# Patient Record
Sex: Female | Born: 1960 | Race: White | Hispanic: No | Marital: Single | Smoking: Former smoker
Health system: Southern US, Community
[De-identification: ages and names within clinical notes are randomized; demographics above are authoritative.]

## PROBLEM LIST (undated history)

## (undated) DIAGNOSIS — L719 Rosacea, unspecified: Secondary | ICD-10-CM

## (undated) DIAGNOSIS — J449 Chronic obstructive pulmonary disease, unspecified: Secondary | ICD-10-CM

## (undated) DIAGNOSIS — J302 Other seasonal allergic rhinitis: Secondary | ICD-10-CM

## (undated) DIAGNOSIS — F32A Depression, unspecified: Secondary | ICD-10-CM

## (undated) DIAGNOSIS — G4730 Sleep apnea, unspecified: Secondary | ICD-10-CM

## (undated) DIAGNOSIS — I10 Essential (primary) hypertension: Secondary | ICD-10-CM

## (undated) DIAGNOSIS — R112 Nausea with vomiting, unspecified: Secondary | ICD-10-CM

## (undated) DIAGNOSIS — E039 Hypothyroidism, unspecified: Secondary | ICD-10-CM

## (undated) DIAGNOSIS — J189 Pneumonia, unspecified organism: Secondary | ICD-10-CM

## (undated) DIAGNOSIS — Z9889 Other specified postprocedural states: Secondary | ICD-10-CM

## (undated) DIAGNOSIS — F329 Major depressive disorder, single episode, unspecified: Secondary | ICD-10-CM

## (undated) DIAGNOSIS — T7840XA Allergy, unspecified, initial encounter: Secondary | ICD-10-CM

## (undated) DIAGNOSIS — E079 Disorder of thyroid, unspecified: Secondary | ICD-10-CM

## (undated) HISTORY — DX: Sleep apnea, unspecified: G47.30

## (undated) HISTORY — DX: Major depressive disorder, single episode, unspecified: F32.9

## (undated) HISTORY — DX: Depression, unspecified: F32.A

## (undated) HISTORY — DX: Allergy, unspecified, initial encounter: T78.40XA

## (undated) HISTORY — DX: Essential (primary) hypertension: I10

## (undated) HISTORY — DX: Rosacea, unspecified: L71.9

## (undated) HISTORY — PX: BREAST CYST ASPIRATION: SHX578

## (undated) HISTORY — PX: FOOT SURGERY: SHX648

## (undated) HISTORY — DX: Disorder of thyroid, unspecified: E07.9

---

## 1983-11-12 HISTORY — PX: APPENDECTOMY: SHX54

## 1992-11-11 HISTORY — PX: CATARACT EXTRACTION: SUR2

## 2005-06-18 ENCOUNTER — Ambulatory Visit: Payer: Self-pay | Admitting: Family Medicine

## 2005-08-19 ENCOUNTER — Ambulatory Visit: Payer: Self-pay | Admitting: Otolaryngology

## 2005-11-11 HISTORY — PX: ABDOMINAL HYSTERECTOMY: SHX81

## 2006-03-13 ENCOUNTER — Ambulatory Visit: Payer: Self-pay | Admitting: Internal Medicine

## 2006-03-17 ENCOUNTER — Ambulatory Visit: Payer: Self-pay | Admitting: Internal Medicine

## 2006-06-03 ENCOUNTER — Ambulatory Visit: Payer: Self-pay | Admitting: Internal Medicine

## 2006-07-16 ENCOUNTER — Ambulatory Visit: Payer: Self-pay | Admitting: Internal Medicine

## 2006-07-24 ENCOUNTER — Ambulatory Visit: Payer: Self-pay | Admitting: Family Medicine

## 2006-08-05 ENCOUNTER — Ambulatory Visit: Payer: Self-pay | Admitting: Urology

## 2006-08-29 ENCOUNTER — Inpatient Hospital Stay: Payer: Self-pay | Admitting: Obstetrics and Gynecology

## 2006-09-20 ENCOUNTER — Other Ambulatory Visit: Payer: Self-pay

## 2006-09-20 ENCOUNTER — Emergency Department: Payer: Self-pay | Admitting: Emergency Medicine

## 2006-09-24 ENCOUNTER — Ambulatory Visit: Payer: Self-pay | Admitting: Urology

## 2006-09-29 ENCOUNTER — Ambulatory Visit: Payer: Self-pay | Admitting: Urology

## 2007-02-02 ENCOUNTER — Ambulatory Visit: Payer: Self-pay | Admitting: Urology

## 2007-07-05 ENCOUNTER — Ambulatory Visit: Payer: Self-pay

## 2007-08-26 ENCOUNTER — Ambulatory Visit: Payer: Self-pay | Admitting: Family Medicine

## 2008-02-05 ENCOUNTER — Ambulatory Visit: Payer: Self-pay | Admitting: Urology

## 2008-11-11 DIAGNOSIS — I7100 Dissection of unspecified site of aorta: Secondary | ICD-10-CM

## 2008-11-11 HISTORY — DX: Dissection of unspecified site of aorta: I71.00

## 2009-02-01 ENCOUNTER — Ambulatory Visit: Payer: Self-pay | Admitting: Urology

## 2009-05-09 ENCOUNTER — Ambulatory Visit: Payer: Self-pay | Admitting: Obstetrics and Gynecology

## 2009-06-11 HISTORY — PX: AORTIC VALVE REPLACEMENT: SHX41

## 2009-07-02 ENCOUNTER — Ambulatory Visit: Payer: Self-pay | Admitting: Cardiovascular Disease

## 2009-07-02 ENCOUNTER — Emergency Department (HOSPITAL_COMMUNITY): Admission: EM | Admit: 2009-07-02 | Discharge: 2009-07-02 | Payer: Self-pay | Admitting: Emergency Medicine

## 2009-07-19 ENCOUNTER — Ambulatory Visit: Payer: Self-pay | Admitting: Family Medicine

## 2009-09-13 ENCOUNTER — Ambulatory Visit: Payer: Self-pay | Admitting: Obstetrics and Gynecology

## 2009-11-27 ENCOUNTER — Ambulatory Visit: Payer: Self-pay | Admitting: Surgery

## 2010-01-17 ENCOUNTER — Ambulatory Visit: Payer: Self-pay | Admitting: Urology

## 2010-05-16 ENCOUNTER — Ambulatory Visit: Payer: Self-pay | Admitting: Obstetrics and Gynecology

## 2011-01-23 ENCOUNTER — Ambulatory Visit: Payer: Self-pay | Admitting: Urology

## 2011-02-08 ENCOUNTER — Ambulatory Visit: Payer: Self-pay | Admitting: Family Medicine

## 2011-02-16 LAB — COMPREHENSIVE METABOLIC PANEL
Albumin: 3.6 g/dL (ref 3.5–5.2)
Alkaline Phosphatase: 97 U/L (ref 39–117)
BUN: 8 mg/dL (ref 6–23)
Creatinine, Ser: 1.01 mg/dL (ref 0.4–1.2)
Glucose, Bld: 177 mg/dL — ABNORMAL HIGH (ref 70–99)
Total Protein: 6.4 g/dL (ref 6.0–8.3)

## 2011-02-16 LAB — POCT I-STAT, CHEM 8
Creatinine, Ser: 1 mg/dL (ref 0.4–1.2)
Glucose, Bld: 166 mg/dL — ABNORMAL HIGH (ref 70–99)
HCT: 39 % (ref 36.0–46.0)
Hemoglobin: 13.3 g/dL (ref 12.0–15.0)
Potassium: 3.7 mEq/L (ref 3.5–5.1)
Sodium: 140 mEq/L (ref 135–145)
TCO2: 22 mmol/L (ref 0–100)

## 2011-02-16 LAB — CK TOTAL AND CKMB (NOT AT ARMC)
CK, MB: 0.9 ng/mL (ref 0.3–4.0)
Relative Index: INVALID (ref 0.0–2.5)
Total CK: 40 U/L (ref 7–177)

## 2011-02-16 LAB — CBC
Hemoglobin: 13.4 g/dL (ref 12.0–15.0)
MCHC: 35.1 g/dL (ref 30.0–36.0)
Platelets: 249 10*3/uL (ref 150–400)
RDW: 12.4 % (ref 11.5–15.5)

## 2011-02-16 LAB — TROPONIN I: Troponin I: 0.01 ng/mL (ref 0.00–0.06)

## 2011-02-16 LAB — PROTIME-INR: INR: 1.2 (ref 0.00–1.49)

## 2011-02-16 LAB — DIFFERENTIAL
Basophils Absolute: 0.1 10*3/uL (ref 0.0–0.1)
Basophils Relative: 0 % (ref 0–1)
Lymphocytes Relative: 19 % (ref 12–46)
Monocytes Absolute: 0.7 10*3/uL (ref 0.1–1.0)
Monocytes Relative: 5 % (ref 3–12)
Neutro Abs: 9.7 10*3/uL — ABNORMAL HIGH (ref 1.7–7.7)
Neutrophils Relative %: 75 % (ref 43–77)

## 2011-02-16 LAB — HEMOCCULT GUIAC POC 1CARD (OFFICE): Fecal Occult Bld: NEGATIVE

## 2011-02-16 LAB — LIPASE, BLOOD: Lipase: 17 U/L (ref 11–59)

## 2011-03-26 NOTE — Consult Note (Signed)
Nancy [name], Nancy [name] NO.:  [id number]   MEDICAL RECORD NO.:  [id number]          PATIENT TYPE:  EMS   LOCATION:  MAJO                         FACILITY:  [facility]   PHYSICIAN:  Noralyn Pick. Eden Emms, MD, FACCDATE OF BIRTH:  [birth year]   DATE OF CONSULTATION:  DATE OF DISCHARGE:                                 CONSULTATION   PRIMARY CARDIOLOGIST:  Dr. Evette Georges in [place].   PRIMARY CARE Khira Cudmore:  Dr. Elease Hashimoto.   PATIENT PROFILE:  A 50 year old Caucasian female with history of aortic  insufficiency followed by Dr. Evette Georges who presented to the [facility]  ED this morning with chest pain and diaphoresis.   PROBLEM LIST:  1. Chest pain and diaphoresis.  1A.  History of negative stress test 2009.  1. Hypertension.  2. Hypothyroidism.  3. Depression.  4. Nocturnal hypoxia, requiring 2 L of O2 at night.  5. Ongoing tobacco abuse 25 pack-year history, now smoking 1-2      cigarettes a day.  6. Status post hysterectomy.  7. Status post appendectomy.  8. History of aortic insufficiency followed by Dr. Evette Georges with      yearly echocardiograms.  9. Status post appendectomy.  10.Status post left foot surgery.  11.History of eye surgery.   HISTORY OF PRESENT ILLNESS:  A 50 year old Caucasian female with history  of aortic insufficiency followed by Dr. Evette Georges in [place].  She  reports having had a negative Myoview in 2009, and says she has yearly  echos.  She is not sure as to the severity of her AI.  Approximately 3  weeks ago, she had an episode of throat, chest, and epigastric pain with  nausea and vomiting for 2 days nonstop and then this resolved  spontaneously.  This morning, she awoke at 8:30 with similar symptoms,  that were 10/10.  She was markedly diaphoretic as well as nauseated with  vomiting x1 at home.  After about 30 minutes, she called the EMS and was  taken to the [facility] ED.  She had multiple episodes of vomiting.  She  continues to have throat,  chest, and epigastric pain, which is not  particularly reproducible.  She has also continued to have nausea with  vomiting and diaphoresis.  She has been treated with fentanyl 300 mcg IV  as well as 4 mg of IV Zofran with improvement in symptoms to 2/10.  She  does have slight worsening of symptoms with deep breathing.  Enzymes are  negative.  ECG is nonacute.  WBCs are up at 13.1, she is afebrile and  her chest x-ray has shown asymmetric infiltrate or edema.  She has a  normal mediastinum.   ALLERGIES:  No known drug allergies.   HOME MEDICATIONS:  1. Coreg 12.5 mg b.i.d.  2. Hydrochlorothiazide 12.5 mg daily.  3. Levothyroxine 125 mcg daily.  4. Zoloft 150 mg daily.  5. Wellbutrin 450 mg daily.   FAMILY HISTORY:  Mother is age 50, alive and well.  Father died at 71  with CAD with history of CABG.  She has a sister who is alive  and well.   SOCIAL HISTORY:  She lives in [place] with her boyfriend.  She works as  an Airline pilot.  She has a 25-pack-year history of tobacco abuse,  currently smoking 1-2 cigarettes a day.  She drinks 3-4 beers a day.  She denies drug use and is not on routine exercise.   REVIEW OF SYSTEMS:  Positive for chest pain, shortness of breath,  diaphoresis, nausea, and vomiting.  Otherwise all systems reviewed are  negative.  She is a full code.   PHYSICAL EXAMINATION:  VITAL SIGNS:  Temperature 98.8, heart rate 82,  respirations 18, blood pressure 116/72, pulse ox 98% on 2 L.  GENERAL:  Pleasant white female in no acute distress.  Awake, alert,  oriented x3.  She is lethargic having just received narcotics.  NEURO:  Grossly intact, nonfocal.  SKIN:  Warm and dry without lesions or masses.  MUSCULOSKELETAL:  Grossly normal without deformity or fusion.  NECK:  Supple without bruits or JVD.  LUNGS:  His lungs with diminished breath sounds, but otherwise clear.  CARDIAC:  Regular S1 and S2.  There is a soft diastolic murmur at the  right upper sternal border.   ABDOMEN:  Round, soft, nontender, nondistended.  Bowel sounds present  x4.  EXTREMITIES:  Warm, dry, pink.  No clubbing, cyanosis, or edema.  Dorsalis pedis, posterior tibial pulse are 2+ equal bilaterally.   LABORATORY DATA:  Chest x-ray shows asymmetric infiltrate or edema, left  greater than right mediastinum not particularly widened.  EKG shows  sinus rhythm, rate 83, normal axis, no acute changes.  Hemoglobin 13.4,  hematocrit 38.0, WBC 13.1, platelets 249.  Sodium 140, potassium 3.7,  chloride 108, CO2 of 24.  BUN 8, creatinine 1.01, glucose 177, AST 19,  ALT 14.  CK-MB 0.9, troponin I 0.01.   ASSESSMENT:  1. Chest pain.  She has throat, chest, and epigastric, heavy and      sharp, pain that has been improved with fentanyl and Zofran.  She      had similar symptoms x2 days about 3 weeks ago and those symptoms      resolved spontaneously.  Enzymes so far negative.  ECG is nonacute.      Not clearly cardiac. Given her known history of aortic valve      pathology will check a CT to R/O disection since aortopathy is      frequently associated with relatively young people with AR.  Will      also assess the degree of AR by echo.  It is not verry prominant on      exam.  If cardiac and aortic disease R/O then would consider gi w/u      for esophagitis/gastritis with possible EGD   Addendum:  CT scan shows Type A disection.  ER physician discussed with  CVTS and physician in OR.  They arranged for transfer to [facility] for urgent  surgery.      Nicolasa Ducking, ANP      Noralyn Pick. Eden Emms, MD, [facility]  Electronically Signed    CB/MEDQ  D:  07/02/2009  T:  07/03/2009  Job:  [id number]

## 2011-07-03 DIAGNOSIS — I7100 Dissection of unspecified site of aorta: Secondary | ICD-10-CM | POA: Insufficient documentation

## 2011-07-30 ENCOUNTER — Ambulatory Visit: Payer: Self-pay | Admitting: Family Medicine

## 2012-05-27 ENCOUNTER — Ambulatory Visit: Payer: Self-pay | Admitting: Family Medicine

## 2012-06-12 LAB — HM PAP SMEAR

## 2012-06-22 LAB — HM HEPATITIS C SCREENING LAB: HM Hepatitis Screen: NEGATIVE

## 2012-06-22 LAB — HM HIV SCREENING LAB: HM HIV Screening: NEGATIVE

## 2013-01-28 DIAGNOSIS — Q6239 Other obstructive defects of renal pelvis and ureter: Secondary | ICD-10-CM | POA: Insufficient documentation

## 2013-02-24 ENCOUNTER — Ambulatory Visit: Payer: Self-pay | Admitting: Specialist

## 2013-02-24 LAB — BASIC METABOLIC PANEL
BUN: 10 mg/dL (ref 7–18)
Calcium, Total: 8.7 mg/dL (ref 8.5–10.1)
Chloride: 102 mmol/L (ref 98–107)
Co2: 32 mmol/L (ref 21–32)
Creatinine: 1.04 mg/dL (ref 0.60–1.30)
EGFR (African American): >60
Glucose: 79 mg/dL (ref 65–99)
Osmolality: 272 (ref 275–301)

## 2013-03-02 ENCOUNTER — Ambulatory Visit: Payer: Self-pay | Admitting: Specialist

## 2013-03-02 LAB — PROTIME-INR: INR: 1.2

## 2013-04-12 HISTORY — PX: CARPAL TUNNEL RELEASE: SHX101

## 2013-08-31 ENCOUNTER — Ambulatory Visit: Payer: Self-pay | Admitting: Gastroenterology

## 2013-08-31 LAB — HM COLONOSCOPY

## 2013-09-22 ENCOUNTER — Ambulatory Visit: Payer: Self-pay | Admitting: Family Medicine

## 2013-11-22 DIAGNOSIS — Z9889 Other specified postprocedural states: Secondary | ICD-10-CM | POA: Insufficient documentation

## 2014-03-10 ENCOUNTER — Ambulatory Visit: Payer: Self-pay | Admitting: Family Medicine

## 2014-03-11 ENCOUNTER — Ambulatory Visit: Payer: Self-pay | Admitting: Family Medicine

## 2014-03-11 ENCOUNTER — Emergency Department: Payer: Self-pay | Admitting: Emergency Medicine

## 2014-03-11 LAB — COMPREHENSIVE METABOLIC PANEL
ALBUMIN: 3.8 g/dL (ref 3.4–5.0)
ANION GAP: 8 (ref 7–16)
Alkaline Phosphatase: 115 U/L
BILIRUBIN TOTAL: 0.5 mg/dL (ref 0.2–1.0)
BUN: 7 mg/dL (ref 7–18)
CALCIUM: 9.1 mg/dL (ref 8.5–10.1)
CHLORIDE: 98 mmol/L (ref 98–107)
CREATININE: 0.7 mg/dL (ref 0.60–1.30)
Co2: 29 mmol/L (ref 21–32)
EGFR (African American): >60
Glucose: 77 mg/dL (ref 65–99)
Osmolality: 267 (ref 275–301)
Potassium: 3.3 mmol/L — ABNORMAL LOW (ref 3.5–5.1)
SGOT(AST): 22 U/L (ref 15–37)
SGPT (ALT): 25 U/L (ref 12–78)
SODIUM: 135 mmol/L — AB (ref 136–145)
Total Protein: 7.9 g/dL (ref 6.4–8.2)

## 2014-03-11 LAB — APTT: ACTIVATED PTT: 41.7 s — AB (ref 23.6–35.9)

## 2014-03-11 LAB — CBC
HCT: 41.3 % (ref 35.0–47.0)
HGB: 13.6 g/dL (ref 12.0–16.0)
MCH: 31.1 pg (ref 26.0–34.0)
MCHC: 33 g/dL (ref 32.0–36.0)
MCV: 94 fL (ref 80–100)
PLATELETS: 235 10*3/uL (ref 150–440)
RBC: 4.39 10*6/uL (ref 3.80–5.20)
RDW: 13.5 % (ref 11.5–14.5)
WBC: 9.4 10*3/uL (ref 3.6–11.0)

## 2014-03-11 LAB — PROTIME-INR
INR: 2.5
Prothrombin Time: 26.6 secs — ABNORMAL HIGH (ref 11.5–14.7)

## 2014-03-11 LAB — TROPONIN I

## 2014-03-23 ENCOUNTER — Ambulatory Visit: Payer: Self-pay | Admitting: Family Medicine

## 2014-03-24 ENCOUNTER — Ambulatory Visit: Payer: Self-pay | Admitting: Family Medicine

## 2014-04-18 ENCOUNTER — Ambulatory Visit: Payer: Self-pay | Admitting: Podiatry

## 2015-03-03 NOTE — Op Note (Signed)
PATIENT NAME:  Nancy [name], Nancy [name] MR#:  [id number] DATE OF BIRTH:  01/30/1961  DATE OF PROCEDURE:  03/02/2013  PREOPERATIVE DIAGNOSIS: Left cubital tunnel syndrome.   POSTOPERATIVE DIAGNOSIS: Left cubital tunnel syndrome.   PROCEDURE: Anterior transposition of left ulnar nerve at the elbow.   SURGEON: Valinda HoarHoward E. Rosan Calbert, M.D.   ANESTHESIA: General LMA.   COMPLICATIONS: None.   DRAINS: None.   ESTIMATED BLOOD LOSS: None.   DESCRIPTION OF PROCEDURE: The patient was brought to the operating room where she underwent satisfactory general LMA anesthesia in the supine position. Her Coumadin was stopped 5 days before and her INR was 1.2. She was getting bridging Lovenox as ordered by her cardiologist for her artificial heart valve. The arm was prepped and draped in sterile fashion and [name] posteromedial incision made over the elbow. Dissection was carried out bluntly through subcutaneous tissue down to the fascia. The fascia was carefully divided above the elbow and the ulnar nerve identified. This was followed proximally and the fascia over the nerve was released. [name] finger was used to make sure that the release was carried out from proximally. The nerve was then followed distally and the fascia over the cubital tunnel released with Metzenbaum scissors and [name] knife with [name] hemostat protecting the nerve. This was followed out distally into the flexor muscle belly which was released as the fascia was released. This was released distally with [name] finger digitally as well. The nerve was then carefully freed up from adhesions. [name] large vessel drain was used to hold it up. The nerve was then felt to be freed proximally and distally and  could be easily transposed anteriorly. The subcutaneous tissue was elevated off the medial epicondyle. The nerve was transposed anteriorly and the subcutaneous tissue then sutured down to the fascia of the medial condyle was 0 Ethibond sutures. The wound was then irrigated. The subcutaneous  tissue was closed with 2-0 Vicryl and the skin was closed with staples. 0.5% Marcaine was placed in the wound. Dry sterile compression dressing was applied with [name] sugar tong splint. The tourniquet was deflated with good return of blood flow to the hand and pressure was applied to the dressing for 5 minutes. The patient was awakened and taken to recovery in good condition.  ____________________________ Valinda HoarHoward E. Neyla Gauntt, MD hem:sb D: 03/02/2013 08:57:51 ET T: 03/02/2013 09:14:17 ET JOB#: [id number]  cc: Valinda HoarHoward E. Bora Bost, MD, <Dictator> Valinda HoarHOWARD E Jusiah Aguayo MD ELECTRONICALLY SIGNED 03/02/2013 20:25

## 2015-05-09 DIAGNOSIS — F32A Depression, unspecified: Secondary | ICD-10-CM | POA: Insufficient documentation

## 2015-05-09 DIAGNOSIS — E559 Vitamin D deficiency, unspecified: Secondary | ICD-10-CM | POA: Insufficient documentation

## 2015-05-09 DIAGNOSIS — R7989 Other specified abnormal findings of blood chemistry: Secondary | ICD-10-CM | POA: Insufficient documentation

## 2015-05-09 DIAGNOSIS — I359 Nonrheumatic aortic valve disorder, unspecified: Secondary | ICD-10-CM | POA: Insufficient documentation

## 2015-05-09 DIAGNOSIS — I10 Essential (primary) hypertension: Secondary | ICD-10-CM | POA: Insufficient documentation

## 2015-05-09 DIAGNOSIS — F419 Anxiety disorder, unspecified: Secondary | ICD-10-CM | POA: Insufficient documentation

## 2015-05-09 DIAGNOSIS — E039 Hypothyroidism, unspecified: Secondary | ICD-10-CM | POA: Insufficient documentation

## 2015-05-09 DIAGNOSIS — R945 Abnormal results of liver function studies: Secondary | ICD-10-CM | POA: Insufficient documentation

## 2015-05-09 DIAGNOSIS — Q6210 Congenital occlusion of ureter, unspecified: Secondary | ICD-10-CM | POA: Insufficient documentation

## 2015-05-09 DIAGNOSIS — N63 Unspecified lump in unspecified breast: Secondary | ICD-10-CM | POA: Insufficient documentation

## 2015-05-09 DIAGNOSIS — M26609 Unspecified temporomandibular joint disorder, unspecified side: Secondary | ICD-10-CM | POA: Insufficient documentation

## 2015-05-09 DIAGNOSIS — R0602 Shortness of breath: Secondary | ICD-10-CM | POA: Insufficient documentation

## 2015-05-09 DIAGNOSIS — F329 Major depressive disorder, single episode, unspecified: Secondary | ICD-10-CM | POA: Insufficient documentation

## 2015-05-09 DIAGNOSIS — I351 Nonrheumatic aortic (valve) insufficiency: Secondary | ICD-10-CM | POA: Insufficient documentation

## 2015-05-09 DIAGNOSIS — N1330 Unspecified hydronephrosis: Secondary | ICD-10-CM | POA: Insufficient documentation

## 2015-05-09 DIAGNOSIS — J449 Chronic obstructive pulmonary disease, unspecified: Secondary | ICD-10-CM | POA: Insufficient documentation

## 2015-05-09 DIAGNOSIS — F411 Generalized anxiety disorder: Secondary | ICD-10-CM | POA: Insufficient documentation

## 2015-05-10 ENCOUNTER — Ambulatory Visit (INDEPENDENT_AMBULATORY_CARE_PROVIDER_SITE_OTHER): Payer: PRIVATE HEALTH INSURANCE | Admitting: Family Medicine

## 2015-05-10 ENCOUNTER — Encounter: Payer: Self-pay | Admitting: Family Medicine

## 2015-05-10 VITALS — BP 114/72 | HR 83 | Temp 98.7°F | Resp 16 | Ht 67.0 in | Wt 197.8 lb

## 2015-05-10 DIAGNOSIS — J40 Bronchitis, not specified as acute or chronic: Secondary | ICD-10-CM | POA: Diagnosis not present

## 2015-05-10 MED ORDER — HYDROCODONE-HOMATROPINE 5-1.5 MG/5ML PO SYRP: 5.0000 mL | ORAL_SOLUTION | Freq: Three times a day (TID) | ORAL | Status: DC | PRN

## 2015-05-10 MED ORDER — DOXYCYCLINE HYCLATE 100 MG PO TABS: 100.0000 mg | ORAL_TABLET | Freq: Two times a day (BID) | ORAL | Status: DC

## 2015-05-10 MED ORDER — PREDNISONE 20 MG PO TABS: ORAL_TABLET | ORAL | Status: DC

## 2015-05-10 NOTE — Progress Notes (Signed)
Subjective:     Patient ID: Erenest RasherLori A Mctigue, female   DOB: 02/20/1961, 54 y.o.   MRN: [id number]  HPI  Chief Complaint  Patient presents with  . Cough    patient comes in office today with complaints of productive cough and congestion for the past 10 days. Patient states that she has taken Night Quil, Alka-Seltzer plus for relief of symptoms  States she has noticed increased shortness of breath and purulent sputum: "I just don't feel good". Reports compliance with her inhalers followed by Dr. Meredeth IdeFleming.   Review of Systems  Constitutional: Negative for fever and chills.       Objective:   Physical Exam Ears: T.M's intact without inflammation Throat: no tonsillar enlargement or exudate Neck: no cervical adenopathy Lungs: clear    Assessment:    1. Bronchitis - doxycycline (VIBRA-TABS) 100 MG tablet; Take 1 tablet (100 mg total) by mouth 2 (two) times daily.  Dispense: 20 tablet; Refill: 0 - HYDROcodone-homatropine (HYCODAN) 5-1.5 MG/5ML syrup; Take 5 mLs by mouth every 8 (eight) hours as needed for cough.  Dispense: 120 mL; Refill: 0 - predniSONE (DELTASONE) 20 MG tablet; Taper as directed: 3 pills for four days, 2 pills for four days, one pill for four days  Dispense: 24 tablet; Refill: 0    Plan:    To start prednisone if shortness of breath not improving with treatment.

## 2015-05-10 NOTE — Patient Instructions (Addendum)
Add expectorant like Mucinex or Robitussin, Recheck protime with your cardiologist while on antibiotics.

## 2015-08-07 ENCOUNTER — Ambulatory Visit (INDEPENDENT_AMBULATORY_CARE_PROVIDER_SITE_OTHER): Payer: No Typology Code available for payment source | Admitting: Family Medicine

## 2015-08-07 ENCOUNTER — Encounter: Payer: Self-pay | Admitting: Family Medicine

## 2015-08-07 VITALS — BP 116/64 | HR 80 | Temp 98.5°F | Resp 16 | Wt 204.0 lb

## 2015-08-07 DIAGNOSIS — J449 Chronic obstructive pulmonary disease, unspecified: Secondary | ICD-10-CM

## 2015-08-07 DIAGNOSIS — Z952 Presence of prosthetic heart valve: Secondary | ICD-10-CM | POA: Insufficient documentation

## 2015-08-07 DIAGNOSIS — Z954 Presence of other heart-valve replacement: Secondary | ICD-10-CM

## 2015-08-07 NOTE — Progress Notes (Signed)
Patient ID: Nancy [name], female   DOB: [birth year], 53 y.o.   MRN: [id number]         Patient: Nancy [name] Female    DOB: November 07, 1961   54 y.o.   MRN: [id number] Visit Date: 08/07/2015  Today's Provider: Lorie Phenix, MD   Chief Complaint  Patient presents with  . COPD   Subjective:    Breathing Problem There is no cough, difficulty breathing, shortness of breath, sputum production or wheezing. This is a chronic problem. The problem has been unchanged. Pertinent negatives include no chest pain, dyspnea on exertion, ear congestion, ear pain, fever, headaches, malaise/fatigue, nasal congestion, PND, postnasal drip, rhinorrhea, sneezing or sore throat. Her symptoms are alleviated by steroid inhaler. She reports significant improvement on treatment. Her past medical history is significant for COPD.   Hypothyroid is stable.   Mood also stable. Follow up with Dr.  Maryruth Bun regularly.      Allergies  Allergen Reactions  . Codeine Nausea Only    Can take cough syrup.   Previous Medications   ACETAMINOPHEN (TYLENOL) 500 MG TABLET    Take 2 tablets by mouth every 6 (six) hours as needed.   ALBUTEROL-IPRATROPIUM (COMBIVENT) 18-103 MCG/ACT INHALER    Inhale 2 puffs into the lungs 4 (four) times daily.   AMLODIPINE (NORVASC) 5 MG TABLET    Take 1 tablet by mouth daily.   BUDESONIDE-FORMOTEROL (SYMBICORT) 160-4.5 MCG/ACT INHALER    Inhale into the lungs.   BUPROPION (WELLBUTRIN XL) 300 MG 24 HR TABLET    Take 1 tablet by mouth daily.   CARVEDILOL (COREG) 25 MG TABLET    Take 1 tablet by mouth 2 (two) times daily.   FUROSEMIDE (LASIX) 40 MG TABLET    Take 1 tablet by mouth daily.   HYDROCODONE-HOMATROPINE (HYCODAN) 5-1.5 MG/5ML SYRUP    Take 5 mLs by mouth every 8 (eight) hours as needed for cough.   LEVOTHYROXINE (SYNTHROID, LEVOTHROID) 125 MCG TABLET    Take 1 tablet by mouth daily.   METHOCARBAMOL (ROBAXIN) 500 MG TABLET    Take 500 mg by mouth 2 (two) times daily.   MONTELUKAST (SINGULAIR)  10 MG TABLET    Take 1 tablet by mouth daily.   PREDNISONE (DELTASONE) 20 MG TABLET    Taper as directed: 3 pills for four days, 2 pills for four days, one pill for four days   VILAZODONE HCL (VIIBRYD) 40 MG TABS    Take 1 tablet by mouth daily.   WARFARIN (COUMADIN) 2.5 MG TABLET    Take 2 tablets by mouth as directed.    Review of Systems  Constitutional: Negative.  Negative for fever and malaise/fatigue.  HENT: Negative.  Negative for ear pain, postnasal drip, rhinorrhea, sneezing and sore throat.   Respiratory: Negative.  Negative for cough, sputum production, shortness of breath and wheezing.   Cardiovascular: Negative.  Negative for chest pain, dyspnea on exertion and PND.  Neurological: Negative for headaches.    Social History  Substance Use Topics  . Smoking status: Light Tobacco Smoker    Types: E-cigarettes  . Smokeless tobacco: Not on file     Comment: patient states  that she is working at her own pace to quit smoking  . Alcohol Use: 0.0 oz/week    0 Standard drinks or equivalent per week     Comment: OCCASIONALLY   Objective:   BP 116/64 mmHg  Pulse 80  Temp(Src) 98.5 F (36.9 C) (Oral)  Resp 16  Wt 204 lb (92.534 kg)  Physical Exam  Constitutional: She is oriented to person, place, and time. She appears well-developed and well-nourished.  Cardiovascular: Normal rate and regular rhythm.   Pulmonary/Chest: Effort normal and breath sounds normal.  Neurological: She is alert and oriented to person, place, and time.  Psychiatric: She has a normal mood and affect. Her behavior is normal. Judgment and thought content normal.        Assessment & Plan:     1. Chronic obstructive pulmonary disease, unspecified COPD, unspecified chronic bronchitis type Stable.  Continue current medication.  Filled out form for DMV.  - Spirometry with graph    Thyroid and mood stable.   Lorie Phenix, MD        Lorie Phenix, MD  [facility]

## 2015-08-11 ENCOUNTER — Other Ambulatory Visit: Payer: Self-pay | Admitting: Family Medicine

## 2015-08-11 DIAGNOSIS — Z1231 Encounter for screening mammogram for malignant neoplasm of breast: Secondary | ICD-10-CM

## 2015-08-14 ENCOUNTER — Ambulatory Visit
Admission: RE | Admit: 2015-08-14 | Discharge: 2015-08-14 | Disposition: A | Discharge status: Home / Self Care | Payer: No Typology Code available for payment source | Source: Ambulatory Visit | Attending: Family Medicine | Admitting: Family Medicine

## 2015-08-14 DIAGNOSIS — Z1231 Encounter for screening mammogram for malignant neoplasm of breast: Secondary | ICD-10-CM | POA: Insufficient documentation

## 2015-08-17 ENCOUNTER — Ambulatory Visit: Payer: No Typology Code available for payment source | Attending: Specialist

## 2015-08-17 DIAGNOSIS — G4733 Obstructive sleep apnea (adult) (pediatric): Secondary | ICD-10-CM | POA: Diagnosis not present

## 2015-08-24 ENCOUNTER — Other Ambulatory Visit: Payer: Self-pay | Admitting: Family Medicine

## 2015-08-24 ENCOUNTER — Ambulatory Visit (INDEPENDENT_AMBULATORY_CARE_PROVIDER_SITE_OTHER): Payer: No Typology Code available for payment source | Admitting: Family Medicine

## 2015-08-24 ENCOUNTER — Encounter: Payer: Self-pay | Admitting: Family Medicine

## 2015-08-24 VITALS — BP 108/62 | HR 89 | Temp 98.2°F | Resp 20 | Ht 67.0 in | Wt 208.0 lb

## 2015-08-24 DIAGNOSIS — R202 Paresthesia of skin: Secondary | ICD-10-CM | POA: Diagnosis not present

## 2015-08-24 DIAGNOSIS — E038 Other specified hypothyroidism: Secondary | ICD-10-CM

## 2015-08-24 DIAGNOSIS — Z0000 Encounter for general adult medical examination without abnormal findings: Secondary | ICD-10-CM | POA: Diagnosis not present

## 2015-08-24 DIAGNOSIS — I10 Essential (primary) hypertension: Secondary | ICD-10-CM | POA: Diagnosis not present

## 2015-08-24 DIAGNOSIS — I359 Nonrheumatic aortic valve disorder, unspecified: Secondary | ICD-10-CM

## 2015-08-24 DIAGNOSIS — E039 Hypothyroidism, unspecified: Secondary | ICD-10-CM

## 2015-08-24 NOTE — Progress Notes (Signed)
Patient ID: Nancy RasherLori A Seabrooks, female   DOB: 05/22/1961, 54 y.o.   MRN: [id number]        Patient: Nancy [name], Female    DOB: 03/04/1961, 54 y.o.   MRN: [id number] Visit Date: 08/24/2015  Today's Provider: Lorie PhenixNancy Chiron Campione, MD   Chief Complaint  Patient presents with  . Annual Exam   Subjective:    Annual physical exam Nancy [name] is a 54 y.o. female who presents today for health maintenance and complete physical. She feels well. She reports exercising . She reports she is sleeping fairly well.  Has complicated PMH and has been following up with specialist. Stable.    07/02/13 CPE 06/12/12 Pap-neg 08/14/15 Mamm-BI-RADS 1 08/31/13 Colon-Polyp-ng recheck in 10 years 03/11/14 EKG  Lab Results  Component Value Date   WBC 9.4 03/11/2014   HGB 13.6 03/11/2014   HCT 41.3 03/11/2014   PLT 235 03/11/2014   GLUCOSE 77 03/11/2014   ALT 25 03/11/2014   AST 22 03/11/2014   NA 135* 03/11/2014   K 3.3* 03/11/2014   CL 98 03/11/2014   CREATININE 0.70 03/11/2014   BUN 7 03/11/2014   CO2 29 03/11/2014   INR 2.5 03/11/2014    -----------------------------------------------------------------   Review of Systems  Constitutional: Positive for fatigue.  HENT: Negative.   Eyes: Negative.   Respiratory: Positive for shortness of breath.   Cardiovascular: Positive for leg swelling.  Gastrointestinal: Negative.   Endocrine: Negative.   Genitourinary: Negative.   Musculoskeletal: Positive for arthralgias ( bursitis).  Skin: Negative.   Allergic/Immunologic: Negative.   Neurological: Negative.   Hematological: Bruises/bleeds easily.  Psychiatric/Behavioral: Negative.     Social History She  reports that she has been smoking E-cigarettes.  She has never used smokeless tobacco. She reports that she drinks alcohol. She reports that she does not use illicit drugs. Social History   Social History  . Marital Status: Single    Spouse Name: N/A  . Number of Children: N/A  . Years of  Education: N/A   Social History Main Topics  . Smoking status: Light Tobacco Smoker    Types: E-cigarettes  . Smokeless tobacco: Never Used     Comment: patient states  that she is working at her own pace to quit smoking  . Alcohol Use: 0.0 oz/week    0 Standard drinks or equivalent per week     Comment: OCCASIONALLY  . Drug Use: No  . Sexual Activity: Not Asked   Other Topics Concern  . None   Social History Narrative    Patient Active Problem List   Diagnosis Date Noted  . 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 Anxiety 05/09/2015  . Breast lump 05/09/2015  . CAFL (chronic airflow limitation) (HCC) 05/09/2015  . Clinical depression 05/09/2015  . Abnormal LFTs 05/09/2015  . Hydronephrosis 05/09/2015  . Adult hypothyroidism 05/09/2015  . BP (high blood pressure) 05/09/2015  . AI (aortic incompetence) 05/09/2015  . D-dimer, elevated 05/09/2015  . Breath shortness 05/09/2015  . 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 Avitaminosis D 05/09/2015  . Depression, major, single episode 05/09/2015  . Essential (primary) hypertension 05/09/2015  . Anxiety state 05/09/2015  . Aortic valve defect 05/09/2015  . Chronic obstructive pulmonary disease (HCC) 05/09/2015  . History of open heart surgery 11/22/2013  . 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 Aortic dissection (HCC) 07/03/2011  . Dissection of aorta (HCC) 07/03/2011    Past Surgical History  Procedure Laterality Date  . Appendectomy  1985  . Abdominal hysterectomy  2007  . Foot surgery Left 1997-1998  . Cataract extraction Right 1994  . Aortic valve replacement  06/2009  . Carpal tunnel release Left 04/12/2013    Family History  Family Status  Relation Status Death Age  . Mother Alive   . Father Deceased   . Maternal Grandmother Deceased   . Paternal Grandmother Deceased   . Maternal Grandfather Deceased   . Paternal Grandfather Deceased    Her family  history includes Breast cancer (age of onset: 4) in her mother; Colon cancer in her maternal grandmother; Heart disease in her father and paternal grandmother; Hypertension in her father; Pneumonia in her mother.    Allergies  Allergen Reactions  . Codeine Nausea Only    Can take cough syrup.    Previous Medications   ACETAMINOPHEN (TYLENOL) 500 MG TABLET    Take 2 tablets by mouth every 6 (six) hours as needed.   ALBUTEROL-IPRATROPIUM (COMBIVENT) 18-103 MCG/ACT INHALER    Inhale 2 puffs into the lungs every 6 (six) hours as needed.    AMLODIPINE (NORVASC) 5 MG TABLET    Take 1 tablet by mouth daily.   BUDESONIDE-FORMOTEROL (SYMBICORT) 160-4.5 MCG/ACT INHALER    Inhale 2 puffs into the lungs daily.    BUPROPION (WELLBUTRIN XL) 300 MG 24 HR TABLET    Take 1 tablet by mouth daily.   CARVEDILOL (COREG) 25 MG TABLET    Take 1 tablet by mouth 2 (two) times daily.   FUROSEMIDE (LASIX) 40 MG TABLET    Take 1 tablet by mouth daily.   LEVOTHYROXINE (SYNTHROID, LEVOTHROID) 125 MCG TABLET    TAKE 1 TABLET BY MOUTH DAILY   METHOCARBAMOL (ROBAXIN) 500 MG TABLET    Take 500 mg by mouth 2 (two) times daily.   MONTELUKAST (SINGULAIR) 10 MG TABLET    Take 1 tablet by mouth daily.   VILAZODONE HCL (VIIBRYD) 40 MG TABS    Take 1 tablet by mouth daily.   WARFARIN (COUMADIN) 2.5 MG TABLET    Take 2 tablets by mouth as directed.    Patient Care Team: Lorie Phenix, MD as PCP - General (Family Medicine)     Objective:   Vitals: BP 108/62 mmHg  Pulse 89  Temp(Src) 98.2 F (36.8 C) (Oral)  Resp 20  Ht  (1.702 m)  Wt 208 lb (94.348 kg)  BMI 32.57 kg/m2  SpO2 96%   Physical Exam  Constitutional: She is oriented to person, place, and time. She appears well-developed and well-nourished.  HENT:  Head: Normocephalic and atraumatic.  Right Ear: Tympanic membrane, external ear and ear canal normal.  Left Ear: Tympanic membrane, external ear and ear canal normal.  Nose: Nose normal.  Mouth/Throat:  Uvula is midline, oropharynx is clear and moist and mucous membranes are normal.  Eyes: Conjunctivae, EOM and lids are normal. Pupils are equal, round, and reactive to light.  Neck: Trachea normal and normal range of motion. Neck supple. Carotid bruit is not present. No thyroid mass and no thyromegaly present.  Cardiovascular: Normal rate and regular rhythm.   Valve click audible.   Pulmonary/Chest: Effort normal and breath sounds normal.  Abdominal: Soft. Normal appearance and bowel sounds are normal. There is no hepatosplenomegaly. There is no tenderness.  Genitourinary: No breast swelling, tenderness or discharge.  Musculoskeletal: Normal range of motion.  Lymphadenopathy:    She has no cervical adenopathy.    She has  no axillary adenopathy.  Neurological: She is alert and oriented to person, place, and time. She has normal strength. No cranial nerve deficit.  Skin: Skin is warm, dry and intact.  Psychiatric: She has a normal mood and affect. Her speech is normal and behavior is normal. Judgment and thought content normal. Cognition and memory are normal.     Depression Screen PHQ 2/9 Scores 08/24/2015  Exception Documentation Other- indicate reason in comment box  Not completed pt seen by Dr. Maryruth Bun      Assessment & Plan:     Routine Health Maintenance and Physical Exam  Exercise Activities and Dietary recommendations Goals    . Exercise 150 minutes per week (moderate activity)       Immunization History  Administered Date(s) Administered  . Td 11/01/2005  . Tdap 06/12/2012    Health Maintenance  Topic Date Due  . Hepatitis C Screening  04/26/1961  . HIV Screening  03/16/1978  . PAP SMEAR  03/16/1984  . COLONOSCOPY  03/16/2013  . INFLUENZA VACCINE  06/12/2015  . MAMMOGRAM  08/13/2017  . TETANUS/TDAP  06/12/2022      1. Annual physical exam Stable. Patient advised to continue eating healthy and exercise daily.  2. Essential hypertension - Comprehensive  metabolic panel - Lipid Panel With LDL/HDL Ratio  3. Other specified hypothyroidism - TSH  4. Aortic valve defect - INR/PT  5. Paresthesia - Vitamin B12     Patient seen and examined by Dr. Leo Grosser, and note scribed by Liz Beach. Dimas, CMA.  I have reviewed the document for accuracy and completeness and I agree with above. Leo Grosser, MD   Lorie Phenix, MD       --------------------------------------------------------------------

## 2015-08-30 ENCOUNTER — Telehealth: Payer: Self-pay

## 2015-08-30 LAB — COMPREHENSIVE METABOLIC PANEL
A/G RATIO: 1.7 (ref 1.1–2.5)
ALT: 13 IU/L (ref 0–32)
AST: 19 IU/L (ref 0–40)
Albumin: 4.3 g/dL (ref 3.5–5.5)
Alkaline Phosphatase: 125 IU/L — ABNORMAL HIGH (ref 39–117)
BUN/Creatinine Ratio: 9 (ref 9–23)
BUN: 9 mg/dL (ref 6–24)
Bilirubin Total: 0.8 mg/dL (ref 0.0–1.2)
CALCIUM: 9.5 mg/dL (ref 8.7–10.2)
CO2: 27 mmol/L (ref 18–29)
Chloride: 99 mmol/L (ref 97–106)
Creatinine, Ser: 1 mg/dL (ref 0.57–1.00)
GFR, EST AFRICAN AMERICAN: 75 mL/min/{1.73_m2} (ref >59)
GFR, EST NON AFRICAN AMERICAN: 65 mL/min/{1.73_m2} (ref >59)
GLOBULIN, TOTAL: 2.6 g/dL (ref 1.5–4.5)
Glucose: 93 mg/dL (ref 65–99)
POTASSIUM: 4.2 mmol/L (ref 3.5–5.2)
SODIUM: 140 mmol/L (ref 136–144)
TOTAL PROTEIN: 6.9 g/dL (ref 6.0–8.5)

## 2015-08-30 LAB — LIPID PANEL WITH LDL/HDL RATIO
Cholesterol, Total: 176 mg/dL (ref 100–199)
HDL: 53 mg/dL (ref >39)
LDL Calculated: 103 mg/dL — ABNORMAL HIGH (ref 0–99)
LDL/HDL RATIO: 1.9 ratio (ref 0.0–3.2)
Triglycerides: 102 mg/dL (ref 0–149)
VLDL Cholesterol Cal: 20 mg/dL (ref 5–40)

## 2015-08-30 LAB — TSH: TSH: 2.29 u[IU]/mL (ref 0.450–4.500)

## 2015-08-30 LAB — VITAMIN B12: VITAMIN B 12: 988 pg/mL — AB (ref 211–946)

## 2015-08-30 LAB — PROTIME-INR
INR: 2.2 — AB (ref 0.8–1.2)
PROTHROMBIN TIME: 22.3 s — AB (ref 9.1–12.0)

## 2015-08-30 NOTE — Telephone Encounter (Signed)
-----   Message from Lorie PhenixNancy Maloney, MD sent at 08/30/2015  6:59 AM EDT ----- Labs stable. INR stable. Please notify patient. Thanks.

## 2015-08-30 NOTE — Telephone Encounter (Signed)
Pt advised.   Thanks,   -Gloria Lambertson  

## 2015-09-13 ENCOUNTER — Ambulatory Visit: Payer: No Typology Code available for payment source | Attending: Specialist

## 2015-09-13 DIAGNOSIS — G4761 Periodic limb movement disorder: Secondary | ICD-10-CM | POA: Insufficient documentation

## 2015-09-13 DIAGNOSIS — G4733 Obstructive sleep apnea (adult) (pediatric): Secondary | ICD-10-CM | POA: Diagnosis not present

## 2015-09-17 ENCOUNTER — Other Ambulatory Visit: Payer: Self-pay | Admitting: Family Medicine

## 2015-09-17 DIAGNOSIS — J309 Allergic rhinitis, unspecified: Secondary | ICD-10-CM

## 2015-09-18 DIAGNOSIS — J309 Allergic rhinitis, unspecified: Secondary | ICD-10-CM | POA: Insufficient documentation

## 2015-11-12 HISTORY — PX: OTHER SURGICAL HISTORY: SHX169

## 2015-12-15 ENCOUNTER — Encounter: Payer: Self-pay | Admitting: Family Medicine

## 2015-12-25 ENCOUNTER — Encounter: Payer: Self-pay | Admitting: Family Medicine

## 2015-12-25 ENCOUNTER — Ambulatory Visit (INDEPENDENT_AMBULATORY_CARE_PROVIDER_SITE_OTHER): Payer: BLUE CROSS/BLUE SHIELD | Admitting: Family Medicine

## 2015-12-25 VITALS — BP 124/70 | HR 84 | Temp 98.8°F | Resp 16 | Wt 207.0 lb

## 2015-12-25 DIAGNOSIS — N63 Unspecified lump in breast: Secondary | ICD-10-CM

## 2015-12-25 DIAGNOSIS — N6310 Unspecified lump in the right breast, unspecified quadrant: Secondary | ICD-10-CM

## 2015-12-25 NOTE — Progress Notes (Signed)
Patient: Nancy [name] Female    DOB: [birth year]   55 y.o.   MRN: [id number] Visit Date: 12/25/2015  Today's Provider: Lorie Phenix, MD   Chief Complaint  Patient presents with  . Breast Mass    Right breast Found on CT 10/16/2015   Subjective:    HPI  Breast Mass: Patient presents for evaluation of a breast mass. Change was noted 2 months ago. Patient does routinely do self breast exams.  Patient does not have noted a change on breast exam. Breast cancer risk factors include family hx on mother's side and mom with breast CA.   Age of menarche was 20.  Last menstrual period was 2007. Patient does not hormonal therapy. Patient is G0P0. Age of first live birth was 88. Patient did not breast feed. Patient denies nipple discharge. Patient admits to to previous breast biopsy. Patient denies a personal history of breast cancer.      Allergies  Allergen Reactions  . Codeine Nausea Only    Can take cough syrup.   Previous Medications   ACETAMINOPHEN (TYLENOL) 500 MG TABLET    Take 2 tablets by mouth every 6 (six) hours as needed.   ALBUTEROL-IPRATROPIUM (COMBIVENT) 18-103 MCG/ACT INHALER    Inhale 2 puffs into the lungs every 6 (six) hours as needed.    AMLODIPINE (NORVASC) 5 MG TABLET    Take 1 tablet by mouth daily.   BUDESONIDE-FORMOTEROL (SYMBICORT) 160-4.5 MCG/ACT INHALER    Inhale 2 puffs into the lungs daily.    BUPROPION (WELLBUTRIN XL) 300 MG 24 HR TABLET    Take 1 tablet by mouth daily.   CARVEDILOL (COREG) 25 MG TABLET    Take 1 tablet by mouth 2 (two) times daily.   FUROSEMIDE (LASIX) 40 MG TABLET    Take 1 tablet by mouth daily.   LEVOTHYROXINE (SYNTHROID, LEVOTHROID) 125 MCG TABLET    TAKE 1 TABLET BY MOUTH DAILY   METHOCARBAMOL (ROBAXIN) 500 MG TABLET    Take 500 mg by mouth 2 (two) times daily.   MONTELUKAST (SINGULAIR) 10 MG TABLET    TAKE 1 TABLET BY MOUTH EVERY DAY   VILAZODONE HCL (VIIBRYD) 40 MG TABS    Take 1 tablet by mouth daily.   WARFARIN (COUMADIN)  2.5 MG TABLET    Take 2 tablets by mouth as directed.    Review of Systems  Constitutional: Negative.     Social History  Substance Use Topics  . Smoking status: Light Tobacco Smoker    Types: E-cigarettes  . Smokeless tobacco: Never Used     Comment: patient states  that she is working at her own pace to quit smoking  . Alcohol Use: 0.0 oz/week    0 Standard drinks or equivalent per week     Comment: OCCASIONALLY   Objective:   BP 124/70 mmHg  Pulse 84  Temp(Src) 98.8 F (37.1 C) (Oral)  Resp 16  Wt 207 lb (93.895 kg)  Physical Exam  Constitutional: She is oriented to person, place, and time. She appears well-developed and well-nourished.  Pulmonary/Chest: Right breast exhibits no mass and no tenderness. Left breast exhibits no mass and no tenderness. Breasts are symmetrical.  Neurological: She is alert and oriented to person, place, and time.      Assessment & Plan:     1. Breast mass, right New problem. Seen on scan. Exam normal. Will check mammogram and ultrasound.  - MM Digital Diagnostic Unilat R -  US BREAST LTD UNI RIGHT INC AXILLA     Patient was seen and examined by Leo Grosser, MD, and note scribed by Kavin Leech, CMA.  I have reviewed the document for accuracy and completeness and I agree with above. - Leo Grosser, MD   Lorie Phenix, MD  [facility]

## 2016-01-02 ENCOUNTER — Other Ambulatory Visit (HOSPITAL_COMMUNITY): Payer: Self-pay | Admitting: Diagnostic Radiology

## 2016-01-02 ENCOUNTER — Ambulatory Visit
Admission: RE | Admit: 2016-01-02 | Discharge: 2016-01-02 | Disposition: A | Discharge status: Home / Self Care | Payer: No Typology Code available for payment source | Source: Ambulatory Visit | Attending: Diagnostic Radiology | Admitting: Diagnostic Radiology

## 2016-01-02 DIAGNOSIS — R9389 Abnormal findings on diagnostic imaging of other specified body structures: Secondary | ICD-10-CM

## 2016-01-02 NOTE — Progress Notes (Unsigned)
Patient ID: Nancy [name], female   DOB: July 01, 1961, 55 y.o.   MRN: [id number]   Patient had a CTA chest at [facility] 10/16/15 stating 3.1cm right breast mass needing further workup.  Note that this mass has been documented to represent a cyst by previous US 03/23/14 and is actually smaller compared to prior mammograms dating back to 2005. I discussed the above with Dr. Lorie Phenix today 01/02/16.

## 2016-01-15 ENCOUNTER — Telehealth: Payer: Self-pay | Admitting: Family Medicine

## 2016-01-15 NOTE — Telephone Encounter (Signed)
[facility] have received film from CT.Pt no longer needs diagnostic mammogram,can go back to screening.She is due in Oct

## 2016-01-24 ENCOUNTER — Other Ambulatory Visit: Payer: Self-pay | Admitting: Family Medicine

## 2016-01-24 DIAGNOSIS — E038 Other specified hypothyroidism: Secondary | ICD-10-CM

## 2016-01-31 DIAGNOSIS — G4733 Obstructive sleep apnea (adult) (pediatric): Secondary | ICD-10-CM | POA: Diagnosis not present

## 2016-01-31 DIAGNOSIS — J439 Emphysema, unspecified: Secondary | ICD-10-CM | POA: Diagnosis not present

## 2016-02-08 DIAGNOSIS — G4733 Obstructive sleep apnea (adult) (pediatric): Secondary | ICD-10-CM | POA: Diagnosis not present

## 2016-02-12 ENCOUNTER — Other Ambulatory Visit: Payer: Self-pay | Admitting: Family Medicine

## 2016-02-12 ENCOUNTER — Encounter: Payer: Self-pay | Admitting: Family Medicine

## 2016-02-12 DIAGNOSIS — I7100 Dissection of unspecified site of aorta: Secondary | ICD-10-CM | POA: Diagnosis not present

## 2016-02-12 DIAGNOSIS — Z952 Presence of prosthetic heart valve: Secondary | ICD-10-CM | POA: Diagnosis not present

## 2016-02-12 DIAGNOSIS — I10 Essential (primary) hypertension: Secondary | ICD-10-CM | POA: Diagnosis not present

## 2016-02-12 DIAGNOSIS — I359 Nonrheumatic aortic valve disorder, unspecified: Secondary | ICD-10-CM | POA: Diagnosis not present

## 2016-02-12 DIAGNOSIS — Z5181 Encounter for therapeutic drug level monitoring: Secondary | ICD-10-CM | POA: Diagnosis not present

## 2016-02-12 MED ORDER — VALACYCLOVIR HCL 1 G PO TABS
2000.0000 mg | ORAL_TABLET | Freq: Two times a day (BID) | ORAL | Status: DC
Start: 2016-02-12 — End: 2018-03-16

## 2016-02-14 DIAGNOSIS — R0602 Shortness of breath: Secondary | ICD-10-CM | POA: Diagnosis not present

## 2016-02-14 DIAGNOSIS — G4733 Obstructive sleep apnea (adult) (pediatric): Secondary | ICD-10-CM | POA: Diagnosis not present

## 2016-02-14 DIAGNOSIS — J410 Simple chronic bronchitis: Secondary | ICD-10-CM | POA: Diagnosis not present

## 2016-02-19 DIAGNOSIS — Z952 Presence of prosthetic heart valve: Secondary | ICD-10-CM | POA: Diagnosis not present

## 2016-02-19 DIAGNOSIS — I719 Aortic aneurysm of unspecified site, without rupture: Secondary | ICD-10-CM | POA: Diagnosis not present

## 2016-03-02 DIAGNOSIS — G4733 Obstructive sleep apnea (adult) (pediatric): Secondary | ICD-10-CM | POA: Diagnosis not present

## 2016-03-02 DIAGNOSIS — J439 Emphysema, unspecified: Secondary | ICD-10-CM | POA: Diagnosis not present

## 2016-03-05 DIAGNOSIS — R0602 Shortness of breath: Secondary | ICD-10-CM | POA: Diagnosis not present

## 2016-03-10 DIAGNOSIS — G4733 Obstructive sleep apnea (adult) (pediatric): Secondary | ICD-10-CM | POA: Diagnosis not present

## 2016-03-17 DIAGNOSIS — I7101 Dissection of thoracic aorta: Secondary | ICD-10-CM | POA: Diagnosis not present

## 2016-03-17 DIAGNOSIS — J9692 Respiratory failure, unspecified with hypercapnia: Secondary | ICD-10-CM | POA: Diagnosis not present

## 2016-03-17 DIAGNOSIS — I711 Thoracic aortic aneurysm, ruptured: Secondary | ICD-10-CM | POA: Diagnosis not present

## 2016-03-17 DIAGNOSIS — J449 Chronic obstructive pulmonary disease, unspecified: Secondary | ICD-10-CM | POA: Diagnosis not present

## 2016-03-17 DIAGNOSIS — J939 Pneumothorax, unspecified: Secondary | ICD-10-CM | POA: Diagnosis not present

## 2016-03-17 DIAGNOSIS — T82898A Other specified complication of vascular prosthetic devices, implants and grafts, initial encounter: Secondary | ICD-10-CM | POA: Diagnosis not present

## 2016-03-17 DIAGNOSIS — J9811 Atelectasis: Secondary | ICD-10-CM | POA: Diagnosis not present

## 2016-03-17 DIAGNOSIS — I719 Aortic aneurysm of unspecified site, without rupture: Secondary | ICD-10-CM | POA: Diagnosis not present

## 2016-03-17 DIAGNOSIS — E039 Hypothyroidism, unspecified: Secondary | ICD-10-CM | POA: Diagnosis not present

## 2016-03-17 DIAGNOSIS — Z952 Presence of prosthetic heart valve: Secondary | ICD-10-CM | POA: Diagnosis not present

## 2016-03-17 DIAGNOSIS — I712 Thoracic aortic aneurysm, without rupture: Secondary | ICD-10-CM | POA: Diagnosis not present

## 2016-03-17 DIAGNOSIS — Z4682 Encounter for fitting and adjustment of non-vascular catheter: Secondary | ICD-10-CM | POA: Diagnosis not present

## 2016-03-17 DIAGNOSIS — I517 Cardiomegaly: Secondary | ICD-10-CM | POA: Diagnosis not present

## 2016-03-17 DIAGNOSIS — E876 Hypokalemia: Secondary | ICD-10-CM | POA: Diagnosis not present

## 2016-03-17 DIAGNOSIS — J9622 Acute and chronic respiratory failure with hypercapnia: Secondary | ICD-10-CM | POA: Diagnosis not present

## 2016-03-17 DIAGNOSIS — E872 Acidosis: Secondary | ICD-10-CM | POA: Diagnosis not present

## 2016-03-17 DIAGNOSIS — Z8679 Personal history of other diseases of the circulatory system: Secondary | ICD-10-CM | POA: Diagnosis not present

## 2016-03-17 DIAGNOSIS — I10 Essential (primary) hypertension: Secondary | ICD-10-CM | POA: Diagnosis not present

## 2016-03-17 DIAGNOSIS — J811 Chronic pulmonary edema: Secondary | ICD-10-CM | POA: Diagnosis not present

## 2016-03-17 DIAGNOSIS — E559 Vitamin D deficiency, unspecified: Secondary | ICD-10-CM | POA: Diagnosis not present

## 2016-03-17 DIAGNOSIS — R918 Other nonspecific abnormal finding of lung field: Secondary | ICD-10-CM | POA: Diagnosis not present

## 2016-03-17 DIAGNOSIS — J948 Other specified pleural conditions: Secondary | ICD-10-CM | POA: Diagnosis not present

## 2016-03-17 DIAGNOSIS — J90 Pleural effusion, not elsewhere classified: Secondary | ICD-10-CM | POA: Diagnosis not present

## 2016-03-17 DIAGNOSIS — G8918 Other acute postprocedural pain: Secondary | ICD-10-CM | POA: Diagnosis not present

## 2016-03-17 DIAGNOSIS — R079 Chest pain, unspecified: Secondary | ICD-10-CM | POA: Diagnosis not present

## 2016-03-18 DIAGNOSIS — I7101 Dissection of thoracic aorta: Secondary | ICD-10-CM | POA: Diagnosis not present

## 2016-03-18 DIAGNOSIS — R918 Other nonspecific abnormal finding of lung field: Secondary | ICD-10-CM | POA: Diagnosis not present

## 2016-03-18 DIAGNOSIS — R079 Chest pain, unspecified: Secondary | ICD-10-CM | POA: Diagnosis not present

## 2016-03-18 DIAGNOSIS — Z952 Presence of prosthetic heart valve: Secondary | ICD-10-CM | POA: Diagnosis not present

## 2016-03-19 DIAGNOSIS — I712 Thoracic aortic aneurysm, without rupture: Secondary | ICD-10-CM | POA: Diagnosis not present

## 2016-03-19 DIAGNOSIS — Z4682 Encounter for fitting and adjustment of non-vascular catheter: Secondary | ICD-10-CM | POA: Diagnosis not present

## 2016-03-19 DIAGNOSIS — I719 Aortic aneurysm of unspecified site, without rupture: Secondary | ICD-10-CM | POA: Diagnosis not present

## 2016-03-19 DIAGNOSIS — I711 Thoracic aortic aneurysm, ruptured: Secondary | ICD-10-CM | POA: Diagnosis not present

## 2016-03-19 DIAGNOSIS — J811 Chronic pulmonary edema: Secondary | ICD-10-CM | POA: Diagnosis not present

## 2016-03-20 DIAGNOSIS — J811 Chronic pulmonary edema: Secondary | ICD-10-CM | POA: Diagnosis not present

## 2016-03-20 DIAGNOSIS — Z952 Presence of prosthetic heart valve: Secondary | ICD-10-CM | POA: Diagnosis not present

## 2016-03-20 DIAGNOSIS — G8918 Other acute postprocedural pain: Secondary | ICD-10-CM | POA: Diagnosis not present

## 2016-03-20 DIAGNOSIS — J9622 Acute and chronic respiratory failure with hypercapnia: Secondary | ICD-10-CM | POA: Diagnosis not present

## 2016-03-20 DIAGNOSIS — I517 Cardiomegaly: Secondary | ICD-10-CM | POA: Diagnosis not present

## 2016-03-20 DIAGNOSIS — R918 Other nonspecific abnormal finding of lung field: Secondary | ICD-10-CM | POA: Diagnosis not present

## 2016-03-20 DIAGNOSIS — I712 Thoracic aortic aneurysm, without rupture: Secondary | ICD-10-CM | POA: Diagnosis not present

## 2016-03-20 DIAGNOSIS — I719 Aortic aneurysm of unspecified site, without rupture: Secondary | ICD-10-CM | POA: Diagnosis not present

## 2016-03-20 DIAGNOSIS — E872 Acidosis: Secondary | ICD-10-CM | POA: Diagnosis not present

## 2016-03-20 DIAGNOSIS — Z8679 Personal history of other diseases of the circulatory system: Secondary | ICD-10-CM | POA: Diagnosis not present

## 2016-03-21 DIAGNOSIS — J90 Pleural effusion, not elsewhere classified: Secondary | ICD-10-CM | POA: Diagnosis not present

## 2016-03-21 DIAGNOSIS — Z952 Presence of prosthetic heart valve: Secondary | ICD-10-CM | POA: Diagnosis not present

## 2016-03-21 DIAGNOSIS — J9811 Atelectasis: Secondary | ICD-10-CM | POA: Diagnosis not present

## 2016-03-21 DIAGNOSIS — R918 Other nonspecific abnormal finding of lung field: Secondary | ICD-10-CM | POA: Diagnosis not present

## 2016-03-22 DIAGNOSIS — Z952 Presence of prosthetic heart valve: Secondary | ICD-10-CM | POA: Diagnosis not present

## 2016-03-22 DIAGNOSIS — Z4682 Encounter for fitting and adjustment of non-vascular catheter: Secondary | ICD-10-CM | POA: Diagnosis not present

## 2016-03-23 DIAGNOSIS — J9811 Atelectasis: Secondary | ICD-10-CM | POA: Diagnosis not present

## 2016-03-23 DIAGNOSIS — Z952 Presence of prosthetic heart valve: Secondary | ICD-10-CM | POA: Diagnosis not present

## 2016-03-23 DIAGNOSIS — J939 Pneumothorax, unspecified: Secondary | ICD-10-CM | POA: Diagnosis not present

## 2016-03-23 DIAGNOSIS — J811 Chronic pulmonary edema: Secondary | ICD-10-CM | POA: Diagnosis not present

## 2016-03-24 DIAGNOSIS — J939 Pneumothorax, unspecified: Secondary | ICD-10-CM | POA: Diagnosis not present

## 2016-03-24 DIAGNOSIS — J811 Chronic pulmonary edema: Secondary | ICD-10-CM | POA: Diagnosis not present

## 2016-03-24 DIAGNOSIS — Z952 Presence of prosthetic heart valve: Secondary | ICD-10-CM | POA: Diagnosis not present

## 2016-03-24 DIAGNOSIS — J9811 Atelectasis: Secondary | ICD-10-CM | POA: Diagnosis not present

## 2016-03-24 DIAGNOSIS — J90 Pleural effusion, not elsewhere classified: Secondary | ICD-10-CM | POA: Diagnosis not present

## 2016-03-24 DIAGNOSIS — R918 Other nonspecific abnormal finding of lung field: Secondary | ICD-10-CM | POA: Diagnosis not present

## 2016-03-24 DIAGNOSIS — I712 Thoracic aortic aneurysm, without rupture: Secondary | ICD-10-CM | POA: Diagnosis not present

## 2016-03-24 DIAGNOSIS — J948 Other specified pleural conditions: Secondary | ICD-10-CM | POA: Diagnosis not present

## 2016-03-25 DIAGNOSIS — Z5181 Encounter for therapeutic drug level monitoring: Secondary | ICD-10-CM | POA: Diagnosis not present

## 2016-03-26 ENCOUNTER — Other Ambulatory Visit: Payer: Self-pay | Admitting: Family Medicine

## 2016-03-26 DIAGNOSIS — J309 Allergic rhinitis, unspecified: Secondary | ICD-10-CM

## 2016-04-01 DIAGNOSIS — J439 Emphysema, unspecified: Secondary | ICD-10-CM | POA: Diagnosis not present

## 2016-04-01 DIAGNOSIS — G4733 Obstructive sleep apnea (adult) (pediatric): Secondary | ICD-10-CM | POA: Diagnosis not present

## 2016-04-02 DIAGNOSIS — I482 Chronic atrial fibrillation: Secondary | ICD-10-CM | POA: Diagnosis not present

## 2016-04-09 DIAGNOSIS — G4733 Obstructive sleep apnea (adult) (pediatric): Secondary | ICD-10-CM | POA: Diagnosis not present

## 2016-04-09 DIAGNOSIS — J939 Pneumothorax, unspecified: Secondary | ICD-10-CM | POA: Diagnosis not present

## 2016-04-09 DIAGNOSIS — I719 Aortic aneurysm of unspecified site, without rupture: Secondary | ICD-10-CM | POA: Diagnosis not present

## 2016-04-22 DIAGNOSIS — N390 Urinary tract infection, site not specified: Secondary | ICD-10-CM | POA: Diagnosis not present

## 2016-04-22 DIAGNOSIS — R319 Hematuria, unspecified: Secondary | ICD-10-CM | POA: Diagnosis not present

## 2016-04-22 DIAGNOSIS — R300 Dysuria: Secondary | ICD-10-CM | POA: Diagnosis not present

## 2016-05-01 ENCOUNTER — Encounter: Payer: Self-pay | Admitting: Family Medicine

## 2016-05-01 ENCOUNTER — Ambulatory Visit (INDEPENDENT_AMBULATORY_CARE_PROVIDER_SITE_OTHER): Payer: BLUE CROSS/BLUE SHIELD | Admitting: Family Medicine

## 2016-05-01 VITALS — BP 110/64 | HR 96 | Temp 98.1°F | Resp 16 | Wt 194.0 lb

## 2016-05-01 DIAGNOSIS — R319 Hematuria, unspecified: Secondary | ICD-10-CM

## 2016-05-01 DIAGNOSIS — N3090 Cystitis, unspecified without hematuria: Secondary | ICD-10-CM

## 2016-05-01 LAB — POCT URINALYSIS DIPSTICK
BILIRUBIN UA: NEGATIVE
Glucose, UA: NEGATIVE
KETONES UA: NEGATIVE
NITRITE UA: NEGATIVE
PH UA: 6
Spec Grav, UA: 1.015
Urobilinogen, UA: 0.2

## 2016-05-01 MED ORDER — CEFDINIR 300 MG PO CAPS: 300.0000 mg | ORAL_CAPSULE | Freq: Two times a day (BID) | ORAL | Status: DC

## 2016-05-01 NOTE — Progress Notes (Signed)
Patient: Nancy RasherLori A Vanderwall Female    DOB: 07/23/1961   55 y.o.   MRN: [id number] Visit Date: 05/01/2016  Today's Provider: Lorie PhenixNancy Shaughn Thomley, MD   Chief Complaint  Patient presents with  . Urinary Tract Infection   Subjective:    Urinary Tract Infection  This is a recurrent ([place] and was put on Macrobid x 7 days for UTI.  Pt reports the UTI sx improved, but sx recurred last night,) problem. Quality: "stinging" The pain is mild. There has been no fever. There is no history of pyelonephritis. Associated symptoms include chills, flank pain (right), frequency and urgency. Pertinent negatives include no discharge, hematuria, hesitancy, nausea, sweats or vomiting. Treatments tried: abx. The treatment provided moderate relief.  Pt has a H/O hydronephrosis, stenosis of ureter, and congenital obstruction of uteropelvic junction.  Course also complicated by recent open heart surgery. Doing well. Also on chronic warfarin treatment.       Allergies  Allergen Reactions  . Codeine Nausea Only    Can take cough syrup.   Current Meds  Medication Sig  . acetaminophen (TYLENOL) 500 MG tablet Take 2 tablets by mouth every 6 (six) hours as needed.  Marland Kitchen. albuterol-ipratropium (COMBIVENT) 18-103 MCG/ACT inhaler Inhale 2 puffs into the lungs every 6 (six) hours as needed.   Marland Kitchen. amLODipine (NORVASC) 5 MG tablet Take 1 tablet by mouth daily.  Marland Kitchen. atorvastatin (LIPITOR) 10 MG tablet TAKE 1 TABLET (10 MG TOTAL) BY MOUTH ONCE DAILY.  . budesonide-formoterol (SYMBICORT) 160-4.5 MCG/ACT inhaler Inhale 2 puffs into the lungs daily.   Marland Kitchen. buPROPion (WELLBUTRIN XL) 300 MG 24 hr tablet Take 1 tablet by mouth daily.  . carvedilol (COREG) 25 MG tablet Take 1 tablet by mouth 2 (two) times daily.  . furosemide (LASIX) 40 MG tablet Take 1 tablet by mouth daily.  Marland Kitchen. levothyroxine (SYNTHROID, LEVOTHROID) 125 MCG tablet TAKE 1 TABLET BY MOUTH DAILY  . methocarbamol (ROBAXIN) 500 MG tablet  Take 500 mg by mouth 2 (two) times daily.  . montelukast (SINGULAIR) 10 MG tablet TAKE 1 TABLET BY MOUTH EVERY DAY  . valACYclovir (VALTREX) 1000 MG tablet Take 2 tablets (2,000 mg total) by mouth 2 (two) times daily. One day as needed  . Vilazodone HCl (VIIBRYD) 40 MG TABS Take 1 tablet by mouth daily.  Marland Kitchen. warfarin (COUMADIN) 2.5 MG tablet Take 2 tablets by mouth as directed.    Review of Systems  Constitutional: Positive for chills.  Gastrointestinal: Negative for nausea and vomiting.  Genitourinary: Positive for urgency, frequency and flank pain (right). Negative for hesitancy and hematuria.    Social History  Substance Use Topics  . Smoking status: Former Smoker    Quit date: 03/11/2016  . Smokeless tobacco: Never Used     Comment: patient states  that she is working at her own pace to quit smoking  . Alcohol Use: 0.0 oz/week    0 Standard drinks or equivalent per week     Comment: OCCASIONALLY   Objective:   BP 110/64 mmHg  Pulse 96  Temp(Src) 98.1 F (36.7 C) (Oral)  Resp 16  Wt 194 lb (87.998 kg)  Physical Exam  Constitutional: She is oriented to person, place, and time. She appears well-developed and well-nourished.  Abdominal: Soft. She exhibits no distension. There is CVA tenderness (right).  Neurological: She is alert and oriented to person, place, and time.  Psychiatric: She has a normal mood and affect.  Her behavior is normal.      Assessment & Plan:     1. Cystitis New problem. Concerning for early pyelonephritis.  UA positive. Not sensitive to previous dose of Macrobid. Treat with Omnicef as below. Send for cx. May need to change  abx pending the results. Call office if problems worsen. Is also on Warfarin.   Results for orders placed or performed in visit on 05/01/16  POCT urinalysis dipstick  Result Value Ref Range   Color, UA Yellow    Clarity, UA Cloudy    Glucose, UA Negative    Bilirubin, UA Negative    Ketones, UA Negative    Spec Grav, UA 1.015     Blood, UA Large    pH, UA 6.0    Protein, UA ++    Urobilinogen, UA 0.2    Nitrite, UA Negative    Leukocytes, UA large (3+) (A) Negative    - POCT urinalysis dipstick - Urine culture - cefdinir (OMNICEF) 300 MG capsule; Take 1 capsule (300 mg total) by mouth 2 (two) times daily.  Dispense: 14 capsule; Refill: 0  2. Hematuria New problem.  Will send for UA and culture. Is on blood thinner.   - Urinalysis, Routine w reflex microscopic (not at [facility])      Patient seen and examined by Leo Grosser, MD, and note scribed by Allene Dillon, CMA.  I have reviewed the document for accuracy and completeness and I agree with above. - Leo Grosser, MD   Lorie Phenix, MD  [facility]

## 2016-05-02 DIAGNOSIS — G4733 Obstructive sleep apnea (adult) (pediatric): Secondary | ICD-10-CM | POA: Diagnosis not present

## 2016-05-02 DIAGNOSIS — J439 Emphysema, unspecified: Secondary | ICD-10-CM | POA: Diagnosis not present

## 2016-05-02 LAB — URINALYSIS, ROUTINE W REFLEX MICROSCOPIC
BILIRUBIN UA: NEGATIVE
Glucose, UA: NEGATIVE
Ketones, UA: NEGATIVE
Nitrite, UA: NEGATIVE
PH UA: 6 (ref 5.0–7.5)
Specific Gravity, UA: 1.012 (ref 1.005–1.030)
Urobilinogen, Ur: 0.2 mg/dL (ref 0.2–1.0)

## 2016-05-02 LAB — MICROSCOPIC EXAMINATION
Casts: NONE SEEN /lpf
RBC, UA: >30 /hpf — AB (ref 0–?)
WBC, UA: >30 /hpf — AB (ref 0–?)

## 2016-05-03 ENCOUNTER — Telehealth: Payer: Self-pay

## 2016-05-03 LAB — URINE CULTURE

## 2016-05-03 NOTE — Telephone Encounter (Signed)
Pt advised.  She reports feeling some better.  Made follow up for Tuesday afternoon.   Thanks,   -Vernona RiegerLaura

## 2016-05-03 NOTE — Telephone Encounter (Signed)
-----   Message from Lorie PhenixNancy Maloney, MD sent at 05/03/2016  6:07 AM EDT ----- Culture did not grow anything out.  Please see how patient is doing.  Needs follow up to recheck urine and make sure red cells and white cells have resolved. Thanks.

## 2016-05-07 ENCOUNTER — Encounter: Payer: Self-pay | Admitting: Family Medicine

## 2016-05-07 ENCOUNTER — Ambulatory Visit (INDEPENDENT_AMBULATORY_CARE_PROVIDER_SITE_OTHER): Payer: BLUE CROSS/BLUE SHIELD | Admitting: Family Medicine

## 2016-05-07 VITALS — BP 130/60 | HR 92 | Temp 98.4°F | Resp 20 | Wt 195.0 lb

## 2016-05-07 DIAGNOSIS — I359 Nonrheumatic aortic valve disorder, unspecified: Secondary | ICD-10-CM | POA: Diagnosis not present

## 2016-05-07 DIAGNOSIS — Z760 Encounter for issue of repeat prescription: Secondary | ICD-10-CM | POA: Diagnosis not present

## 2016-05-07 DIAGNOSIS — R82998 Other abnormal findings in urine: Secondary | ICD-10-CM

## 2016-05-07 DIAGNOSIS — N390 Urinary tract infection, site not specified: Secondary | ICD-10-CM

## 2016-05-07 LAB — POCT URINALYSIS DIPSTICK
Glucose, UA: NEGATIVE
Ketones, UA: NEGATIVE
Nitrite, UA: POSITIVE
Spec Grav, UA: 1.015
Urobilinogen, UA: 0.2
pH, UA: 6.5

## 2016-05-07 NOTE — Progress Notes (Signed)
Patient: Nancy [name] Female    DOB: 08/13/1961   55 y.o.   MRN: [id number] Visit Date: 05/07/2016  Today's Provider: Lorie PhenixNancy Avalee Castrellon, MD   Chief Complaint  Patient presents with  . Dysuria   Subjective:    HPI     Follow up for Dysuria  The patient was last seen for this 6 days ago. Changes made at last visit include adding Omnicef.  She reports excellent compliance with treatment. She feels that condition is Improved. She is not having side effects.  Culture was negative. Pt needed to FU to ensure WBC and RBC has resolved.  ------------------------------------------------------------------------------------     Allergies  Allergen Reactions  . Codeine Nausea Only    Can take cough syrup.   Current Meds  Medication Sig  . acetaminophen (TYLENOL) 500 MG tablet Take 2 tablets by mouth every 6 (six) hours as needed.  Marland Kitchen. albuterol-ipratropium (COMBIVENT) 18-103 MCG/ACT inhaler Inhale 2 puffs into the lungs every 6 (six) hours as needed.   Marland Kitchen. amLODipine (NORVASC) 5 MG tablet Take 1 tablet by mouth daily.  Marland Kitchen. atorvastatin (LIPITOR) 10 MG tablet TAKE 1 TABLET (10 MG TOTAL) BY MOUTH ONCE DAILY.  . budesonide-formoterol (SYMBICORT) 160-4.5 MCG/ACT inhaler Inhale 2 puffs into the lungs daily.   Marland Kitchen. buPROPion (WELLBUTRIN XL) 300 MG 24 hr tablet Take 1 tablet by mouth daily.  . carvedilol (COREG) 25 MG tablet Take 1 tablet by mouth 2 (two) times daily.  . cefdinir (OMNICEF) 300 MG capsule Take 1 capsule (300 mg total) by mouth 2 (two) times daily.  . furosemide (LASIX) 40 MG tablet Take 1 tablet by mouth daily.  Marland Kitchen. levothyroxine (SYNTHROID, LEVOTHROID) 125 MCG tablet TAKE 1 TABLET BY MOUTH DAILY  . methocarbamol (ROBAXIN) 500 MG tablet Take 500 mg by mouth 2 (two) times daily.  . montelukast (SINGULAIR) 10 MG tablet TAKE 1 TABLET BY MOUTH EVERY DAY  . valACYclovir (VALTREX) 1000 MG tablet Take 2 tablets (2,000 mg total) by mouth 2 (two) times daily. One day as needed  .  Vilazodone HCl (VIIBRYD) 40 MG TABS Take 1 tablet by mouth daily.  Marland Kitchen. warfarin (COUMADIN) 2.5 MG tablet Take 2 tablets by mouth as directed.    Review of Systems  Constitutional: Positive for diaphoresis (night sweats) and fatigue. Negative for fever, chills, activity change, appetite change and unexpected weight change.  Respiratory: Positive for cough. Negative for shortness of breath and wheezing.   Cardiovascular: Negative for chest pain, palpitations and leg swelling.  Genitourinary: Negative for dysuria, urgency, hematuria, flank pain and pelvic pain.    Social History  Substance Use Topics  . Smoking status: Former Smoker    Quit date: 03/11/2016  . Smokeless tobacco: Never Used     Comment: patient states  that she is working at her own pace to quit smoking  . Alcohol Use: 0.0 oz/week    0 Standard drinks or equivalent per week     Comment: OCCASIONALLY   Objective:   BP 130/60 mmHg  Pulse 92  Temp(Src) 98.4 F (36.9 C) (Oral)  Resp 20  Wt 195 lb (88.451 kg)  Physical Exam  Constitutional: She is oriented to person, place, and time. She appears well-developed and well-nourished.  Neurological: She is alert and oriented to person, place, and time.  Psychiatric: She has a normal mood and affect. Her behavior is normal.      Assessment & Plan:     1. Leukocytes in urine Previous  cx was negative. Still on Omnicef. Will refer to urology for further workup. - POCT urinalysis dipstick - Ambulatory referral to Urology  Results for orders placed or performed in visit on 05/07/16  POCT urinalysis dipstick  Result Value Ref Range   Color, UA Yellow    Clarity, UA Cloudy    Glucose, UA Negative    Bilirubin, UA Small    Ketones, UA Negative    Spec Grav, UA 1.015    Blood, UA Moderate    pH, UA 6.5    Protein, UA trace    Urobilinogen, UA 0.2    Nitrite, UA positive    Leukocytes, UA large (3+) (A) Negative      Patient seen and examined by Nancy GrosserNancy J. Bonny Egger, MD,  and note scribed by Nancy [name], CMA.  I have reviewed the document for accuracy and completeness and I agree with above. - Nancy GrosserNancy J. Sameen Leas, MD   Lorie PhenixNancy Bani Gianfrancesco, MD  [facility]

## 2016-05-08 ENCOUNTER — Telehealth: Payer: Self-pay | Admitting: Family Medicine

## 2016-05-08 NOTE — Telephone Encounter (Signed)
Patient advised as below.  

## 2016-05-08 NOTE — Telephone Encounter (Signed)
Pt [facility] and they can not see her until 06/03/2016.  Pt is asking if it is ok to wait or should she see someone else?  CB#[phone]/MW

## 2016-05-08 NOTE — Telephone Encounter (Signed)
We can try Dr. Achilles Dunkope, but do not think it will be any quicker. If not having symptoms, Ok to wait. May need to follow up here if symptoms recur. Thanks.

## 2016-05-10 DIAGNOSIS — G4733 Obstructive sleep apnea (adult) (pediatric): Secondary | ICD-10-CM | POA: Diagnosis not present

## 2016-05-23 ENCOUNTER — Telehealth: Payer: Self-pay | Admitting: Physician Assistant

## 2016-05-23 DIAGNOSIS — R82998 Other abnormal findings in urine: Secondary | ICD-10-CM

## 2016-05-23 NOTE — Telephone Encounter (Signed)
Pt stated that she is still having symptoms of frequent urinating but the out put is very little. Pt stated she is uncomfortable and her appt with [facility] isn't until 06/12/16. Pt wanted to see if there was anything that could be called in that she can try until her appt with [facility]. [facility]. Please advise. Thanks TNP

## 2016-05-24 MED ORDER — NITROFURANTOIN MONOHYD MACRO 100 MG PO CAPS: 100.0000 mg | ORAL_CAPSULE | Freq: Every day | ORAL | Status: DC

## 2016-05-24 NOTE — Telephone Encounter (Signed)
Please advise.  Thanks,  -Joseline 

## 2016-05-24 NOTE — Telephone Encounter (Signed)
I will send in macrobid to hopefully help until she is seen by Urologist. I will send lower dose like what we use for people with recurrent infections that is safer to take daily. She is to call if this does not help.

## 2016-05-24 NOTE — Telephone Encounter (Signed)
Spoke with patient and she reports that she was doing good with the antibiotic but once she finished it the symptoms came back.She reports she is going very frequent to the bathroom, and that when she goes to urinate, patinet state"I feel like all her guts" are going to come out.  Please advise.  Thanks,  -Joseline

## 2016-05-24 NOTE — Telephone Encounter (Signed)
Patient advised as directed below.  Thanks,  -Zaray Gatchel 

## 2016-05-24 NOTE — Telephone Encounter (Signed)
Does she feel better on the antibiotic? Is she having any other symptoms, dysuria, fever, nausea? I know her culture was negative most recently, but she does have a urological history I believe (saw on her PMH). I have never seen her before so just trying to get a background.

## 2016-05-27 DIAGNOSIS — Z5181 Encounter for therapeutic drug level monitoring: Secondary | ICD-10-CM | POA: Diagnosis not present

## 2016-06-01 DIAGNOSIS — G4733 Obstructive sleep apnea (adult) (pediatric): Secondary | ICD-10-CM | POA: Diagnosis not present

## 2016-06-01 DIAGNOSIS — J439 Emphysema, unspecified: Secondary | ICD-10-CM | POA: Diagnosis not present

## 2016-06-09 DIAGNOSIS — G4733 Obstructive sleep apnea (adult) (pediatric): Secondary | ICD-10-CM | POA: Diagnosis not present

## 2016-06-13 DIAGNOSIS — F331 Major depressive disorder, recurrent, moderate: Secondary | ICD-10-CM | POA: Diagnosis not present

## 2016-06-14 ENCOUNTER — Encounter: Payer: Self-pay | Admitting: Urology

## 2016-06-14 ENCOUNTER — Ambulatory Visit (INDEPENDENT_AMBULATORY_CARE_PROVIDER_SITE_OTHER): Payer: BLUE CROSS/BLUE SHIELD | Admitting: Urology

## 2016-06-14 VITALS — BP 126/74 | HR 96 | Ht 68.0 in | Wt 199.0 lb

## 2016-06-14 DIAGNOSIS — R3129 Other microscopic hematuria: Secondary | ICD-10-CM

## 2016-06-14 DIAGNOSIS — R350 Frequency of micturition: Secondary | ICD-10-CM | POA: Diagnosis not present

## 2016-06-14 DIAGNOSIS — Q6211 Congenital occlusion of ureteropelvic junction: Secondary | ICD-10-CM | POA: Diagnosis not present

## 2016-06-14 DIAGNOSIS — Q6239 Other obstructive defects of renal pelvis and ureter: Secondary | ICD-10-CM

## 2016-06-14 LAB — URINALYSIS, COMPLETE
BILIRUBIN UA: NEGATIVE
GLUCOSE, UA: NEGATIVE
KETONES UA: NEGATIVE
Leukocytes, UA: NEGATIVE
Nitrite, UA: NEGATIVE
Protein, UA: NEGATIVE
SPEC GRAV UA: 1.01 (ref 1.005–1.030)
UUROB: 0.2 mg/dL (ref 0.2–1.0)
pH, UA: 7 (ref 5.0–7.5)

## 2016-06-14 LAB — MICROSCOPIC EXAMINATION: Bacteria, UA: NONE SEEN

## 2016-06-14 LAB — BLADDER SCAN AMB NON-IMAGING

## 2016-06-14 NOTE — Progress Notes (Signed)
06/14/2016 6:10 PM   Nancy [name] February 05, 1961 [id number]  Referring provider: Lorie Phenix, MD [address] [place], [place] [address]  Chief Complaint  Patient presents with  . Hematuria    New Patient    HPI: 55 year old female who is referred to our office for workup of microscopic hematuria. She thinks that she's been referred for evaluation of recurrent urinary tract infections. She also has a personal history of the congenital right UPJ obstruction was previously followed by Dr. Achilles Dunk at [facility] urology.   [place] cloudy urine, urinary frequency, nocturia treated with nitrofurantoin.  Her symptoms recurred several days after completing this course.  She followed up with Dr. Elease Hashimoto on 05/01/16 at which time her urine culture was negative.  UA at that time did show >30 RBC/ >30 WBC, nitrite negative.    She again developed recurrence of her symptoms after completing her abx course.  Since that time, she's been taking low-dose daily nitrofurantoin as UTI prophylaxis.  Today, she has urinary urgency, frequency, and feels like her "insides are falling out".  She also has new onset nocturia x 2.    No fevers or chills.  No nausea, vomiting.  No urge incontinence or stress incontinence.    She is s/p hysterectomy/ BSO 10 years ago for reasons that she does not recall.    She is sexually active.  No pain with intercourse.  Some vaginal dryness.  No vaginal deliveries.     She has personal history of congenital UPJ obstruction, right which was being managed conservatively.  Stent and definitive management with the UPJ repair were discussed with her.   Last RUS in 2012.  She denies any recent episodes of right flank pain. She is otherwise asymptomatic from this.  She drinks diet Pepsi x 2 (no coffee), diet Juice, not much water.   She does take 40 mg lasix daily bid.     PMH: Past Medical History:  Diagnosis Date    . Allergy   . Depression   . Hypertension   . Sleep apnea   . Thyroid disease     Surgical History: Past Surgical History:  Procedure Laterality Date  . ABDOMINAL HYSTERECTOMY  2007  . AORTIC VALVE REPLACEMENT  06/2009  . APPENDECTOMY  1985  . CARPAL TUNNEL RELEASE Left 04/12/2013  . CATARACT EXTRACTION Right 1994  . FOOT SURGERY Left 1997-1998    Home Medications:    Medication List       Accurate as of 06/14/16 11:59 PM. Always use your most recent med list.          amLODipine 5 MG tablet Commonly known as:  NORVASC Take 1 tablet by mouth daily.   atorvastatin 10 MG tablet Commonly known as:  LIPITOR TAKE 1 TABLET (10 MG TOTAL) BY MOUTH ONCE DAILY.   carvedilol 25 MG tablet Commonly known as:  COREG Take 1 tablet by mouth 2 (two) times daily.   COMBIVENT 18-103 MCG/ACT inhaler Generic drug:  albuterol-ipratropium Inhale 2 puffs into the lungs every 6 (six) hours as needed.   furosemide 40 MG tablet Commonly known as:  LASIX Take 1 tablet by mouth daily.   levothyroxine 125 MCG tablet Commonly known as:  SYNTHROID, LEVOTHROID TAKE 1 TABLET BY MOUTH DAILY   montelukast 10 MG tablet Commonly known as:  SINGULAIR TAKE 1 TABLET BY MOUTH EVERY DAY   nitrofurantoin (macrocrystal-monohydrate) 100 MG  capsule Commonly known as:  MACROBID Take 1 capsule (100 mg total) by mouth at bedtime.   SYMBICORT 160-4.5 MCG/ACT inhaler Generic drug:  budesonide-formoterol Inhale 2 puffs into the lungs daily.   TYLENOL 500 MG tablet Generic drug:  acetaminophen Take 2 tablets by mouth every 6 (six) hours as needed.   valACYclovir 1000 MG tablet Commonly known as:  VALTREX Take 2 tablets (2,000 mg total) by mouth 2 (two) times daily. One day as needed   warfarin 2.5 MG tablet Commonly known as:  COUMADIN Take 2 tablets by mouth as directed.   WELLBUTRIN XL 300 MG 24 hr tablet Generic drug:  buPROPion Take 1 tablet by mouth daily.       Allergies:  Allergies   Allergen Reactions  . Codeine Nausea Only    Can take cough syrup.    Family History: Family History  Problem Relation Age of Onset  . Pneumonia Mother   . Breast cancer Mother 25    twice 50's and 8  . Hypertension Father   . Heart disease Father   . Colon cancer Maternal Grandmother   . Heart disease Paternal Grandmother   . Prostate cancer Neg Hx   . Kidney cancer Neg Hx     Social History:  reports that she quit smoking about 3 months ago. She has never used smokeless tobacco. She reports that she drinks alcohol. She reports that she does not use drugs.  ROS: UROLOGY Frequent Urination?: Yes Hard to postpone urination?: Yes Burning/pain with urination?: No Get up at night to urinate?: Yes Leakage of urine?: Yes Urine stream starts and stops?: No Trouble starting stream?: No Do you have to strain to urinate?: No Blood in urine?: Yes Urinary tract infection?: Yes Sexually transmitted disease?: No Injury to kidneys or bladder?: No Painful intercourse?: No Weak stream?: No Currently pregnant?: No Vaginal bleeding?: No Last menstrual period?: n  Gastrointestinal Nausea?: No Vomiting?: No Indigestion/heartburn?: No Diarrhea?: No Constipation?: No  Constitutional Fever: No Night sweats?: Yes Weight loss?: No Fatigue?: Yes  Skin Skin rash/lesions?: No Itching?: No  Eyes Blurred vision?: No Double vision?: No  Ears/Nose/Throat Sore throat?: No Sinus problems?: No  Hematologic/Lymphatic Swollen glands?: No Easy bruising?: No  Cardiovascular Leg swelling?: No Chest pain?: No  Respiratory Cough?: No Shortness of breath?: Yes  Endocrine Excessive thirst?: No  Musculoskeletal Back pain?: No Joint pain?: No  Neurological Headaches?: No Dizziness?: No  Psychologic Depression?: Yes Anxiety?: No  Physical Exam: BP 126/74   Pulse 96   Ht 5\' 8"  (1.727 m)   Wt 199 lb (90.3 kg)   BMI 30.26 kg/m   Constitutional:  Alert and oriented,  No acute distress. HEENT: [place] AT, moist mucus membranes.  Trachea midline, no masses. Cardiovascular: No clubbing, cyanosis, or edema. Respiratory: Normal respiratory effort, no increased work of breathing. GI: Abdomen is soft, nontender, nondistended, no abdominal masses GU: No CVA tenderness.  Pelvic: Atrophic vaginitis appreciated. Normal external genitalia.  No significant cystocele or rectocele.  Mild apical descent with valsalva.  Normal meatus without SUI or hypermobility.     Skin: No rashes, bruises or suspicious lesions. Lymph: No cervical or inguinal adenopathy. Neurologic: Grossly intact, no focal deficits, moving all 4 extremities. Psychiatric: Normal mood and affect.  Laboratory Data: Lab Results  Component Value Date   WBC 9.4 03/11/2014   HGB 13.6 03/11/2014   HCT 41.3 03/11/2014   MCV 94 03/11/2014   PLT 235 03/11/2014    Lab Results  Component  Value Date   CREATININE 1.00 08/29/2015    Urinalysis Results for orders placed or performed in visit on 06/14/16  Microscopic Examination  Result Value Ref Range   WBC, UA 0-5 0 - 5 /hpf   RBC, UA 0-2 0 - 2 /hpf   Epithelial Cells (non renal) 0-10 0 - 10 /hpf   Bacteria, UA None seen None seen/Few  Urinalysis, Complete  Result Value Ref Range   Specific Gravity, UA 1.010 1.005 - 1.030   pH, UA 7.0 5.0 - 7.5   Color, UA Yellow Yellow   Appearance Ur Clear Clear   Leukocytes, UA Negative Negative   Protein, UA Negative Negative/Trace   Glucose, UA Negative Negative   Ketones, UA Negative Negative   RBC, UA Trace (A) Negative   Bilirubin, UA Negative Negative   Urobilinogen, Ur 0.2 0.2 - 1.0 mg/dL   Nitrite, UA Negative Negative   Microscopic Examination See below:   BLADDER SCAN AMB NON-IMAGING  Result Value Ref Range   Scan Result 27ml     Pertinent Imaging: Reviewed previous renal ultrasound and CT reports.  Assessment & Plan:    1. Microscopic hematuria Referred for evaluation of microscopic  hematuria, although, UAs reveal no evidence of red blood cells per high-powered field and the absence of infection today. Previous UAs were in the setting of urinary tract infection therefore she does not yet meet the criteria for microscopic hematuria workup. - Urinalysis, Complete  2. Urinary frequency We discussed behavioral modification at length today.  No evidence of cystocele or incomplete bladder emptying a bladder scan today contributing to her symptoms. I suspect her new onset frequency may be secondary to recent bladder inflammation which is since resolved after treatment of her infection. She was advised to call and let us know of her symptoms failed to improve with behavioral modification and time. - BLADDER SCAN AMB NON-IMAGING  3. UPJ obstruction, congenital Congenital UPJ obstruction, asymptomatic, creatinine at baseline. Reviewed options including conservative management with the understanding that she may have some renal atrophy over time, UPJ repair, and chronic indwelling stent. She would like to continue conservative management. RUS fo baseline  --> will call with reults Asymptoamtic.   Annual RUS/ Cr to assess parenchyma/ function.   - US Renal; Future  Return in about 1 year (around 06/14/2017) for repeat RUS.  Vanna Scotland, MD  [facility] [address] [place], [place] [address] [phone]

## 2016-07-02 DIAGNOSIS — J439 Emphysema, unspecified: Secondary | ICD-10-CM | POA: Diagnosis not present

## 2016-07-02 DIAGNOSIS — G4733 Obstructive sleep apnea (adult) (pediatric): Secondary | ICD-10-CM | POA: Diagnosis not present

## 2016-07-10 DIAGNOSIS — G4733 Obstructive sleep apnea (adult) (pediatric): Secondary | ICD-10-CM | POA: Diagnosis not present

## 2016-07-10 DIAGNOSIS — F331 Major depressive disorder, recurrent, moderate: Secondary | ICD-10-CM | POA: Diagnosis not present

## 2016-07-23 DIAGNOSIS — J439 Emphysema, unspecified: Secondary | ICD-10-CM | POA: Diagnosis not present

## 2016-07-23 DIAGNOSIS — G4733 Obstructive sleep apnea (adult) (pediatric): Secondary | ICD-10-CM | POA: Diagnosis not present

## 2016-07-23 DIAGNOSIS — Z5181 Encounter for therapeutic drug level monitoring: Secondary | ICD-10-CM | POA: Diagnosis not present

## 2016-07-28 ENCOUNTER — Other Ambulatory Visit: Payer: Self-pay | Admitting: Family Medicine

## 2016-07-28 DIAGNOSIS — E038 Other specified hypothyroidism: Secondary | ICD-10-CM

## 2016-08-02 DIAGNOSIS — J439 Emphysema, unspecified: Secondary | ICD-10-CM | POA: Diagnosis not present

## 2016-08-02 DIAGNOSIS — G4733 Obstructive sleep apnea (adult) (pediatric): Secondary | ICD-10-CM | POA: Diagnosis not present

## 2016-08-06 DIAGNOSIS — J410 Simple chronic bronchitis: Secondary | ICD-10-CM | POA: Diagnosis not present

## 2016-08-06 DIAGNOSIS — I10 Essential (primary) hypertension: Secondary | ICD-10-CM | POA: Diagnosis not present

## 2016-08-06 DIAGNOSIS — I719 Aortic aneurysm of unspecified site, without rupture: Secondary | ICD-10-CM | POA: Diagnosis not present

## 2016-08-06 DIAGNOSIS — Z952 Presence of prosthetic heart valve: Secondary | ICD-10-CM | POA: Diagnosis not present

## 2016-08-08 DIAGNOSIS — F331 Major depressive disorder, recurrent, moderate: Secondary | ICD-10-CM | POA: Diagnosis not present

## 2016-08-10 DIAGNOSIS — G4733 Obstructive sleep apnea (adult) (pediatric): Secondary | ICD-10-CM | POA: Diagnosis not present

## 2016-09-01 DIAGNOSIS — J439 Emphysema, unspecified: Secondary | ICD-10-CM | POA: Diagnosis not present

## 2016-09-01 DIAGNOSIS — G4733 Obstructive sleep apnea (adult) (pediatric): Secondary | ICD-10-CM | POA: Diagnosis not present

## 2016-09-05 DIAGNOSIS — F331 Major depressive disorder, recurrent, moderate: Secondary | ICD-10-CM | POA: Diagnosis not present

## 2016-09-09 DIAGNOSIS — G4733 Obstructive sleep apnea (adult) (pediatric): Secondary | ICD-10-CM | POA: Diagnosis not present

## 2016-09-25 DIAGNOSIS — J410 Simple chronic bronchitis: Secondary | ICD-10-CM | POA: Diagnosis not present

## 2016-09-25 DIAGNOSIS — Z952 Presence of prosthetic heart valve: Secondary | ICD-10-CM | POA: Diagnosis not present

## 2016-09-25 DIAGNOSIS — I719 Aortic aneurysm of unspecified site, without rupture: Secondary | ICD-10-CM | POA: Diagnosis not present

## 2016-09-25 DIAGNOSIS — R0602 Shortness of breath: Secondary | ICD-10-CM | POA: Diagnosis not present

## 2016-09-25 DIAGNOSIS — I10 Essential (primary) hypertension: Secondary | ICD-10-CM | POA: Diagnosis not present

## 2016-09-25 DIAGNOSIS — Z5181 Encounter for therapeutic drug level monitoring: Secondary | ICD-10-CM | POA: Diagnosis not present

## 2016-09-30 ENCOUNTER — Other Ambulatory Visit: Payer: Self-pay | Admitting: Family Medicine

## 2016-09-30 DIAGNOSIS — J309 Allergic rhinitis, unspecified: Secondary | ICD-10-CM

## 2016-09-30 DIAGNOSIS — J410 Simple chronic bronchitis: Secondary | ICD-10-CM | POA: Diagnosis not present

## 2016-10-01 NOTE — Telephone Encounter (Signed)
Please review-aa 

## 2016-10-02 DIAGNOSIS — J439 Emphysema, unspecified: Secondary | ICD-10-CM | POA: Diagnosis not present

## 2016-10-14 ENCOUNTER — Ambulatory Visit (INDEPENDENT_AMBULATORY_CARE_PROVIDER_SITE_OTHER): Payer: Self-pay

## 2016-10-14 ENCOUNTER — Encounter (INDEPENDENT_AMBULATORY_CARE_PROVIDER_SITE_OTHER): Payer: Self-pay | Admitting: Orthopedic Surgery

## 2016-10-14 ENCOUNTER — Ambulatory Visit (INDEPENDENT_AMBULATORY_CARE_PROVIDER_SITE_OTHER): Payer: BLUE CROSS/BLUE SHIELD | Admitting: Orthopedic Surgery

## 2016-10-14 DIAGNOSIS — M79671 Pain in right foot: Secondary | ICD-10-CM

## 2016-10-14 MED ORDER — ACETAMINOPHEN-CODEINE #3 300-30 MG PO TABS: 1.0000 | ORAL_TABLET | Freq: Four times a day (QID) | ORAL | 0 refills | Status: DC | PRN

## 2016-10-14 NOTE — Progress Notes (Signed)
Office Visit Note   Patient: Nancy RasherLori A Laumann           Date of Birth: 09/07/1961           MRN: [id number] Visit Date: 10/14/2016 Requested by: Margaretann LovelessJennifer M Burnette, PA-C [address] [place], [place] [id number] PCP: Margaretann LovelessJennifer M Burnette, PA-C  Subjective: Chief Complaint  Patient presents with  . Right Foot - Injury, Pain    HPI Lawson FiscalLori is a 55 year old patient with right foot pain.  She had an injury today this morning when she stepped onto a short step at work.  He caught the right foot on the concrete.  Now she has pain all across the metatarsal heads but particularly metatarsal head #1.  She cannot bear weight currently.  She took tramadol which did not help.              Review of Systems All systems reviewed are negative as they relate to the chief complaint within the history of present illness.  Patient denies  fevers or chills.    Assessment & Plan: Visit Diagnoses:  1. Pain in right foot     Plan: Impression is right foot metatarsal head #1 pain.  This could represent a plantar plate injury.  No fracture on x-ray.  I want to put her weightbearing as tolerated in a fracture boot see her back in 4 weeks.  Okay to discontinue the fracture boot at night.  I also wrapped the MTP joint in compression Ace wrap today.  Also wrote her prescription for Tylenol No. 3 to take as needed for pain  Follow-Up Instructions: Return in about 4 weeks (around 11/11/2016).   Orders:  Orders Placed This Encounter  Procedures  . XR Foot Complete Right   Meds ordered this encounter  Medications  . acetaminophen-codeine (TYLENOL #3) 300-30 MG tablet    Sig: Take 1 tablet by mouth every 6 (six) hours as needed for moderate pain.    Dispense:  30 tablet    Refill:  0      Procedures: No procedures performed   Clinical Data: No additional findings.  Objective: Vital Signs: There were no vitals taken for this visit.  Physical Exam  Constitutional: She appears well-developed.    HENT:  Head: Normocephalic.  Eyes: EOM are normal.  Neck: Normal range of motion.  Cardiovascular: Normal rate.   Pulmonary/Chest: Effort normal.  Neurological: She is alert.  Skin: Skin is warm.  Psychiatric: She has a normal mood and affect.    Ortho Exam on examination of the right foot the patient has swelling and tenderness metatarsal head #1.  EhL and toe flexion is intact.  There is no tarsometatarsal tenderness.  There is swelling around the forefoot region.  No lesser toe deformity is present.  Ankle range of motion is normal and nontender.  Asian has palpable anterior to posterior tib peroneal and Achilles tendons.  Specialty Comments:  No specialty comments available.  Imaging: Xr Foot Complete Right  Result Date: 10/14/2016 3 views right foot demonstrate no fracture dislocation around the MTP joints.  Tarsometatarsal alignment is normal.  No metatarsal fracture is present soft tissue swelling present around the first MTP joint    PMFS History: Patient Active Problem List   Diagnosis Date Noted  . Pain in right foot 10/14/2016  . Leukocytes in urine 05/07/2016  . Allergic rhinitis 09/18/2015  . 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 Anxiety 05/09/2015  . Breast lump 05/09/2015  .  CAFL (chronic airflow limitation) (HCC) 05/09/2015  . Clinical depression 05/09/2015  . Abnormal LFTs 05/09/2015  . Hydronephrosis 05/09/2015  . Adult hypothyroidism 05/09/2015  . BP (high blood pressure) 05/09/2015  . AI (aortic incompetence) 05/09/2015  . D-dimer, elevated 05/09/2015  . Breath shortness 05/09/2015  . 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 Avitaminosis D 05/09/2015  . Depression, major, single episode 05/09/2015  . Essential (primary) hypertension 05/09/2015  . Anxiety state 05/09/2015  . Aortic valve defect 05/09/2015  . Chronic obstructive pulmonary disease (HCC) 05/09/2015  . History of open heart surgery 11/22/2013  .  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 Aortic dissection (HCC) 07/03/2011  . Dissection of aorta (HCC) 07/03/2011   Past Medical History:  Diagnosis Date  . Allergy   . Depression   . Hypertension   . Sleep apnea   . Thyroid disease     Family History  Problem Relation Age of Onset  . Pneumonia Mother   . Breast cancer Mother 6150    twice 50's and 4777  . Hypertension Father   . Heart disease Father   . Colon cancer Maternal Grandmother   . Heart disease Paternal Grandmother   . Prostate cancer Neg Hx   . Kidney cancer Neg Hx     Past Surgical History:  Procedure Laterality Date  . ABDOMINAL HYSTERECTOMY  2007  . AORTIC VALVE REPLACEMENT  06/2009  . APPENDECTOMY  1985  . CARPAL TUNNEL RELEASE Left 04/12/2013  . CATARACT EXTRACTION Right 1994  . FOOT SURGERY Left 1997-1998   Social History   Occupational History  . Not on file.   Social History Main Topics  . Smoking status: Former Smoker    Quit date: 03/11/2016  . Smokeless tobacco: Never Used     Comment: patient states  that she is working at her own pace to quit smoking  . Alcohol use 0.0 oz/week     Comment: OCCASIONALLY  . Drug use: No  . Sexual activity: Not on file

## 2016-11-18 ENCOUNTER — Ambulatory Visit (INDEPENDENT_AMBULATORY_CARE_PROVIDER_SITE_OTHER): Payer: No Typology Code available for payment source | Admitting: Orthopedic Surgery

## 2016-12-02 DIAGNOSIS — J439 Emphysema, unspecified: Secondary | ICD-10-CM | POA: Diagnosis not present

## 2016-12-02 DIAGNOSIS — G4733 Obstructive sleep apnea (adult) (pediatric): Secondary | ICD-10-CM | POA: Diagnosis not present

## 2016-12-05 DIAGNOSIS — Z952 Presence of prosthetic heart valve: Secondary | ICD-10-CM | POA: Diagnosis not present

## 2016-12-05 DIAGNOSIS — J439 Emphysema, unspecified: Secondary | ICD-10-CM | POA: Diagnosis not present

## 2016-12-05 DIAGNOSIS — Z5181 Encounter for therapeutic drug level monitoring: Secondary | ICD-10-CM | POA: Diagnosis not present

## 2016-12-05 DIAGNOSIS — I719 Aortic aneurysm of unspecified site, without rupture: Secondary | ICD-10-CM | POA: Diagnosis not present

## 2016-12-05 DIAGNOSIS — I10 Essential (primary) hypertension: Secondary | ICD-10-CM | POA: Diagnosis not present

## 2016-12-13 DIAGNOSIS — H31012 Macula scars of posterior pole (postinflammatory) (post-traumatic), left eye: Secondary | ICD-10-CM | POA: Diagnosis not present

## 2016-12-13 DIAGNOSIS — H52223 Regular astigmatism, bilateral: Secondary | ICD-10-CM | POA: Diagnosis not present

## 2016-12-13 DIAGNOSIS — H5213 Myopia, bilateral: Secondary | ICD-10-CM | POA: Diagnosis not present

## 2016-12-13 DIAGNOSIS — H524 Presbyopia: Secondary | ICD-10-CM | POA: Diagnosis not present

## 2017-01-02 DIAGNOSIS — J439 Emphysema, unspecified: Secondary | ICD-10-CM | POA: Diagnosis not present

## 2017-01-02 DIAGNOSIS — G4733 Obstructive sleep apnea (adult) (pediatric): Secondary | ICD-10-CM | POA: Diagnosis not present

## 2017-01-06 DIAGNOSIS — Z952 Presence of prosthetic heart valve: Secondary | ICD-10-CM | POA: Diagnosis not present

## 2017-01-06 DIAGNOSIS — I719 Aortic aneurysm of unspecified site, without rupture: Secondary | ICD-10-CM | POA: Diagnosis not present

## 2017-01-06 DIAGNOSIS — Z9889 Other specified postprocedural states: Secondary | ICD-10-CM | POA: Diagnosis not present

## 2017-01-28 DIAGNOSIS — G4733 Obstructive sleep apnea (adult) (pediatric): Secondary | ICD-10-CM | POA: Diagnosis not present

## 2017-01-28 DIAGNOSIS — J439 Emphysema, unspecified: Secondary | ICD-10-CM | POA: Diagnosis not present

## 2017-01-28 DIAGNOSIS — Z5181 Encounter for therapeutic drug level monitoring: Secondary | ICD-10-CM | POA: Diagnosis not present

## 2017-01-30 DIAGNOSIS — J439 Emphysema, unspecified: Secondary | ICD-10-CM | POA: Diagnosis not present

## 2017-01-30 DIAGNOSIS — G4733 Obstructive sleep apnea (adult) (pediatric): Secondary | ICD-10-CM | POA: Diagnosis not present

## 2017-02-10 ENCOUNTER — Other Ambulatory Visit: Payer: Self-pay | Admitting: Physician Assistant

## 2017-02-10 DIAGNOSIS — E038 Other specified hypothyroidism: Secondary | ICD-10-CM

## 2017-02-17 ENCOUNTER — Other Ambulatory Visit: Payer: Self-pay | Admitting: Physician Assistant

## 2017-02-17 DIAGNOSIS — J3089 Other allergic rhinitis: Secondary | ICD-10-CM

## 2017-02-17 MED ORDER — MONTELUKAST SODIUM 10 MG PO TABS: 10.0000 mg | ORAL_TABLET | Freq: Every day | ORAL | 5 refills | Status: DC

## 2017-02-17 NOTE — Telephone Encounter (Signed)
[facility] faxed a request on the following medication for pt. Upon patient request 90-day can be filled for 30 day supply. Thanks CC  montelukast (SINGULAIR) 10 MG tablet  Take 1 Tablet by mouth Every day.

## 2017-02-18 ENCOUNTER — Other Ambulatory Visit: Payer: Self-pay | Admitting: Physician Assistant

## 2017-02-18 DIAGNOSIS — J3089 Other allergic rhinitis: Secondary | ICD-10-CM

## 2017-02-18 MED ORDER — MONTELUKAST SODIUM 10 MG PO TABS: 10.0000 mg | ORAL_TABLET | Freq: Every day | ORAL | 1 refills | Status: DC

## 2017-02-18 NOTE — Telephone Encounter (Signed)
[facility] faxed a request for a 90-day supply for the following medication.  Thanks CC  montelukast (SINGULAIR) 10 MG tablet  Take 1 tablet by mouth every day

## 2017-03-02 DIAGNOSIS — G4733 Obstructive sleep apnea (adult) (pediatric): Secondary | ICD-10-CM | POA: Diagnosis not present

## 2017-03-02 DIAGNOSIS — J439 Emphysema, unspecified: Secondary | ICD-10-CM | POA: Diagnosis not present

## 2017-03-03 ENCOUNTER — Other Ambulatory Visit: Payer: Self-pay | Admitting: Physician Assistant

## 2017-03-03 NOTE — Telephone Encounter (Addendum)
Called patient to asked if she had pick up medication from [facility] pharmacy? No answer. Per last refill request it was asked [facility] but it looks like it was sent to [facility] pharmacy. I just want to make sure first.  Thanks,  -Joseline

## 2017-03-03 NOTE — Telephone Encounter (Signed)
[facility] faxed a request for a 90-days supply on the following medication. Thanks CC  montelukast (SINGULAIR) 10 MG tablet  Take 1 tablet by mouth every day.

## 2017-03-04 DIAGNOSIS — J449 Chronic obstructive pulmonary disease, unspecified: Secondary | ICD-10-CM | POA: Diagnosis not present

## 2017-03-04 NOTE — Telephone Encounter (Signed)
Per patient she picked the medication from [facility] pharmacy.  Thanks,  -Nancy [name]

## 2017-03-25 DIAGNOSIS — F331 Major depressive disorder, recurrent, moderate: Secondary | ICD-10-CM | POA: Diagnosis not present

## 2017-04-01 DIAGNOSIS — J439 Emphysema, unspecified: Secondary | ICD-10-CM | POA: Diagnosis not present

## 2017-04-01 DIAGNOSIS — G4733 Obstructive sleep apnea (adult) (pediatric): Secondary | ICD-10-CM | POA: Diagnosis not present

## 2017-04-03 DIAGNOSIS — J449 Chronic obstructive pulmonary disease, unspecified: Secondary | ICD-10-CM | POA: Diagnosis not present

## 2017-04-09 DIAGNOSIS — G4733 Obstructive sleep apnea (adult) (pediatric): Secondary | ICD-10-CM | POA: Diagnosis not present

## 2017-04-14 DIAGNOSIS — J439 Emphysema, unspecified: Secondary | ICD-10-CM | POA: Diagnosis not present

## 2017-04-14 DIAGNOSIS — J449 Chronic obstructive pulmonary disease, unspecified: Secondary | ICD-10-CM | POA: Diagnosis not present

## 2017-04-14 DIAGNOSIS — Z952 Presence of prosthetic heart valve: Secondary | ICD-10-CM | POA: Diagnosis not present

## 2017-04-14 DIAGNOSIS — I10 Essential (primary) hypertension: Secondary | ICD-10-CM | POA: Diagnosis not present

## 2017-04-14 DIAGNOSIS — G4730 Sleep apnea, unspecified: Secondary | ICD-10-CM | POA: Diagnosis not present

## 2017-04-14 DIAGNOSIS — Z5181 Encounter for therapeutic drug level monitoring: Secondary | ICD-10-CM | POA: Diagnosis not present

## 2017-04-14 DIAGNOSIS — R0602 Shortness of breath: Secondary | ICD-10-CM | POA: Diagnosis not present

## 2017-04-14 DIAGNOSIS — R918 Other nonspecific abnormal finding of lung field: Secondary | ICD-10-CM | POA: Diagnosis not present

## 2017-04-18 DIAGNOSIS — F331 Major depressive disorder, recurrent, moderate: Secondary | ICD-10-CM | POA: Diagnosis not present

## 2017-05-02 ENCOUNTER — Telehealth: Payer: Self-pay | Admitting: Urology

## 2017-05-02 ENCOUNTER — Other Ambulatory Visit: Payer: Self-pay | Admitting: Family Medicine

## 2017-05-02 DIAGNOSIS — G4733 Obstructive sleep apnea (adult) (pediatric): Secondary | ICD-10-CM | POA: Diagnosis not present

## 2017-05-02 DIAGNOSIS — J439 Emphysema, unspecified: Secondary | ICD-10-CM | POA: Diagnosis not present

## 2017-05-02 DIAGNOSIS — Q6239 Other obstructive defects of renal pelvis and ureter: Secondary | ICD-10-CM

## 2017-05-02 DIAGNOSIS — Q6211 Congenital occlusion of ureteropelvic junction: Principal | ICD-10-CM

## 2017-05-02 NOTE — Telephone Encounter (Signed)
Done

## 2017-05-02 NOTE — Telephone Encounter (Signed)
Dr. Apolinar JunesBrandon ordered a RUS for this patient but the one in there has expired and I will need another one so that she can have it prior to her app on 06-13-17.  Thanks,  Marcelino DusterMichelle

## 2017-05-04 DIAGNOSIS — J449 Chronic obstructive pulmonary disease, unspecified: Secondary | ICD-10-CM | POA: Diagnosis not present

## 2017-05-08 DIAGNOSIS — R609 Edema, unspecified: Secondary | ICD-10-CM | POA: Diagnosis not present

## 2017-05-08 DIAGNOSIS — Z952 Presence of prosthetic heart valve: Secondary | ICD-10-CM | POA: Diagnosis not present

## 2017-05-08 DIAGNOSIS — Z5181 Encounter for therapeutic drug level monitoring: Secondary | ICD-10-CM | POA: Diagnosis not present

## 2017-05-08 DIAGNOSIS — I10 Essential (primary) hypertension: Secondary | ICD-10-CM | POA: Diagnosis not present

## 2017-05-08 DIAGNOSIS — R0602 Shortness of breath: Secondary | ICD-10-CM | POA: Diagnosis not present

## 2017-05-19 DIAGNOSIS — T50995A Adverse effect of other drugs, medicaments and biological substances, initial encounter: Secondary | ICD-10-CM | POA: Diagnosis not present

## 2017-05-21 DIAGNOSIS — F320 Major depressive disorder, single episode, mild: Secondary | ICD-10-CM | POA: Diagnosis not present

## 2017-06-01 DIAGNOSIS — G4733 Obstructive sleep apnea (adult) (pediatric): Secondary | ICD-10-CM | POA: Diagnosis not present

## 2017-06-01 DIAGNOSIS — J439 Emphysema, unspecified: Secondary | ICD-10-CM | POA: Diagnosis not present

## 2017-06-03 DIAGNOSIS — J449 Chronic obstructive pulmonary disease, unspecified: Secondary | ICD-10-CM | POA: Diagnosis not present

## 2017-06-04 IMAGING — MG MM DIGITAL SCREENING BILAT W/ CAD
5 series · 5 of 5 positions shown · non-contrast
Comparison: Previous exam(s).

CLINICAL DATA: Screening.

EXAM:
DIGITAL SCREENING BILATERAL MAMMOGRAM WITH CAD

[L MLO]
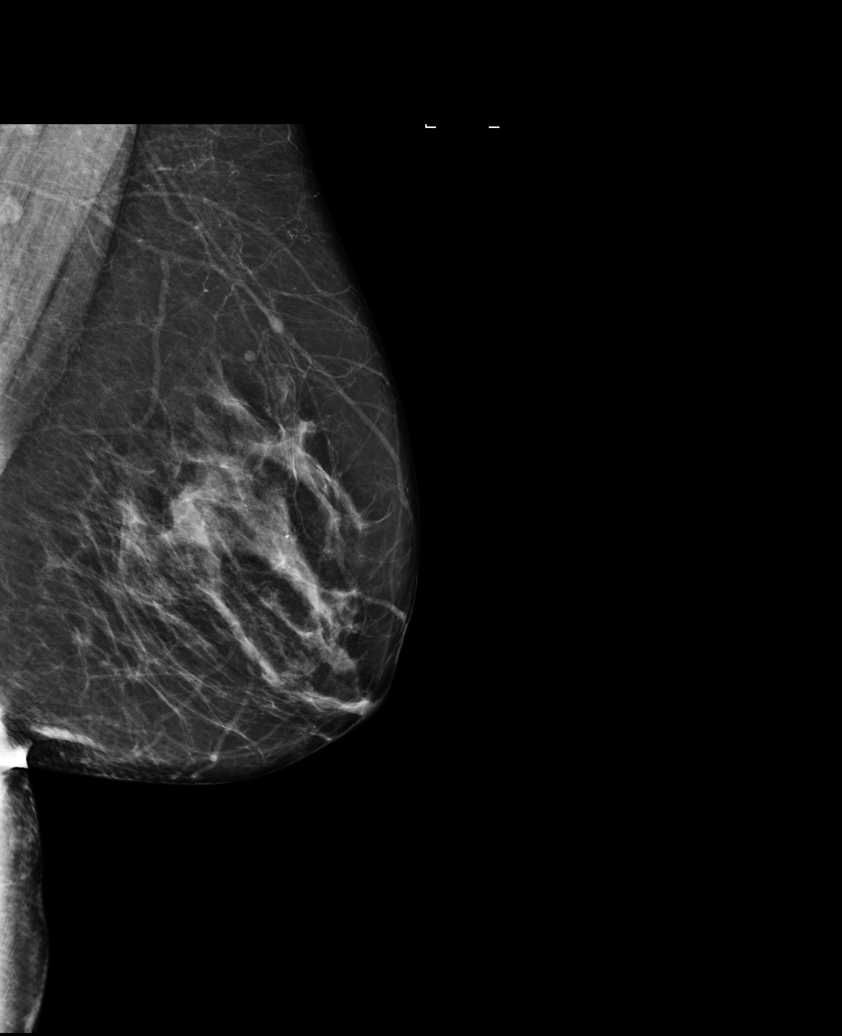

[R MLO (1 of 2)]
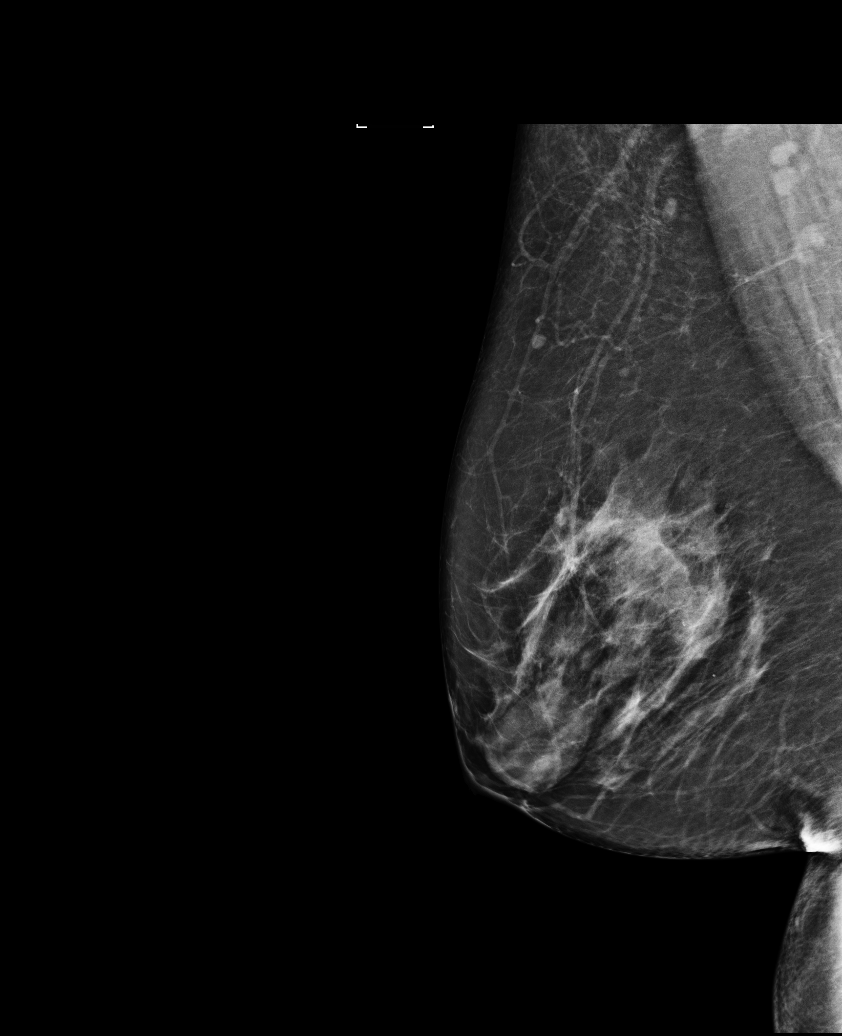

[R MLO (2 of 2)]
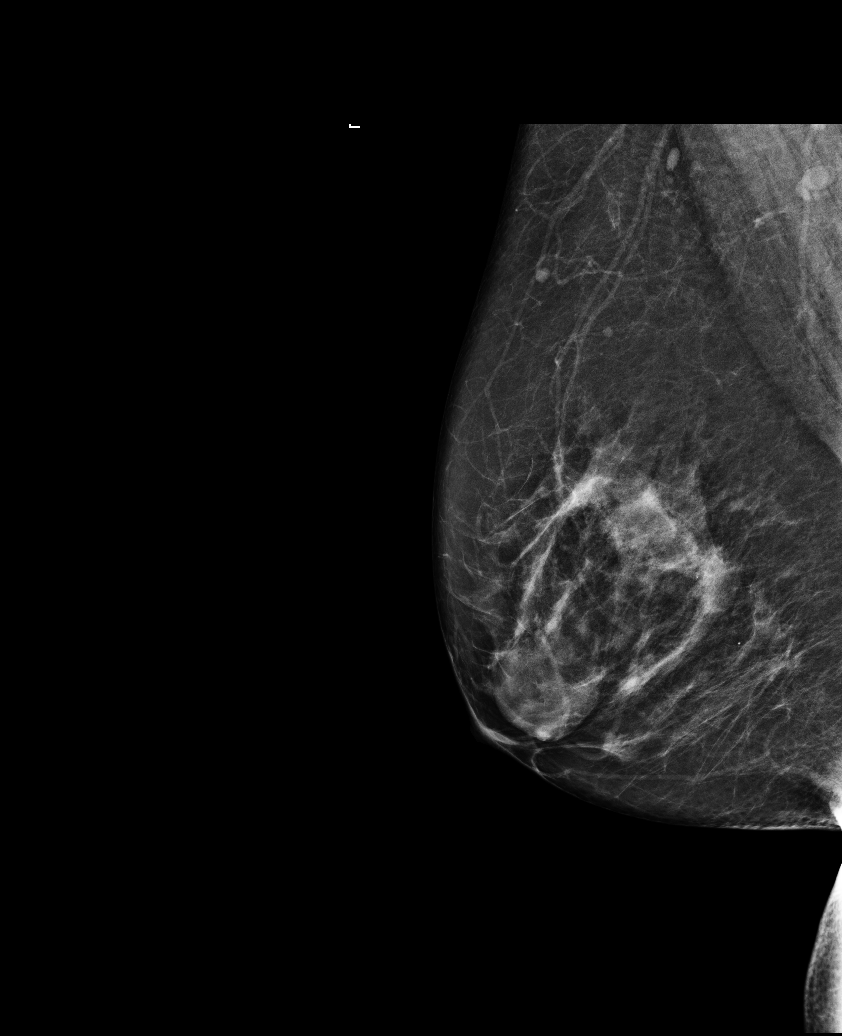

[R CC]
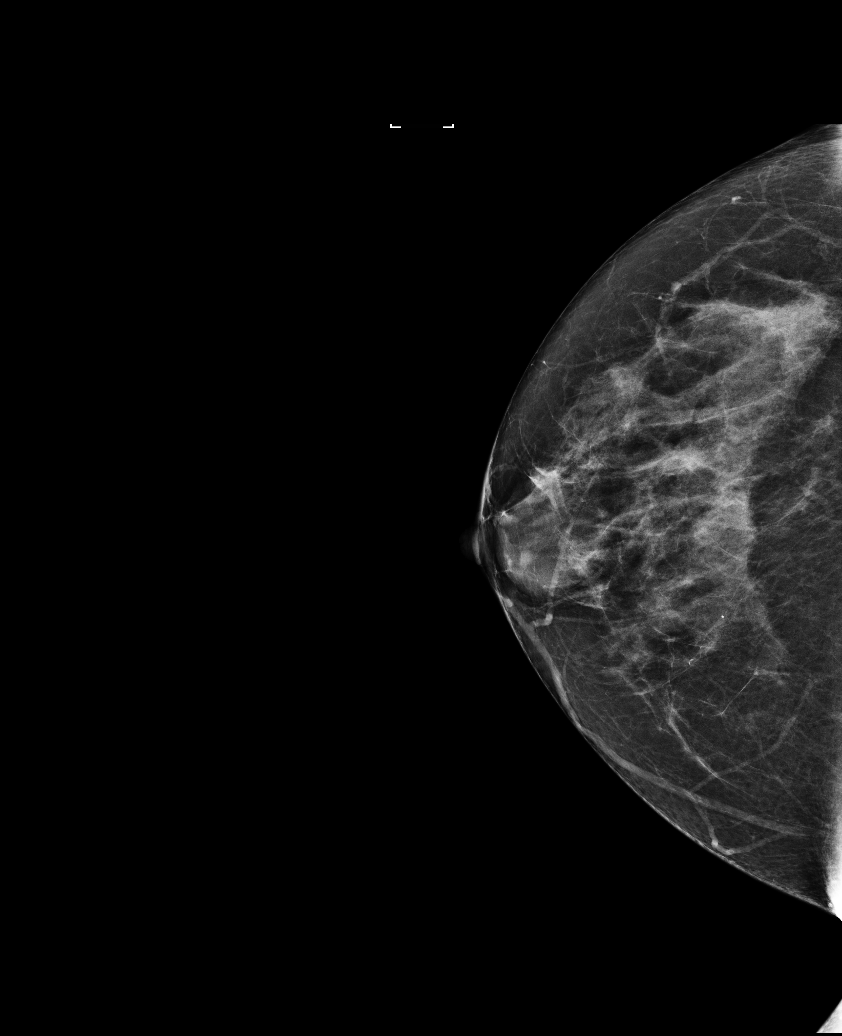

[L CC]
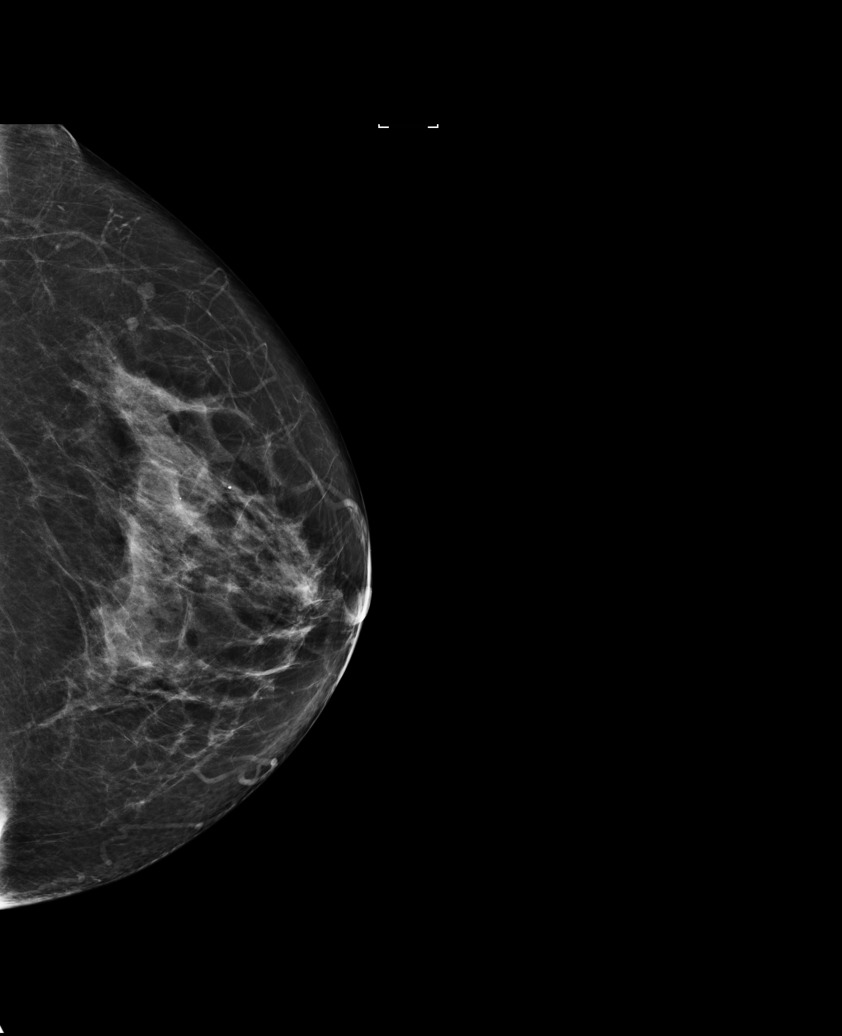

[5 of 5 positions shown; findings below may reference images not displayed]

ACR Breast Density Category c: The breast tissue is heterogeneously
dense, which may obscure small masses.
FINDINGS: There are no findings suspicious for malignancy. Images were
processed with CAD.
IMPRESSION: No mammographic evidence of malignancy. A result letter of this
screening mammogram will be mailed directly to the patient.

RECOMMENDATION:
Screening mammogram in one year. (Code:YJ-2-FEZ)

BI-RADS CATEGORY  1: Negative.

## 2017-06-10 ENCOUNTER — Other Ambulatory Visit: Payer: Self-pay | Admitting: Optometry

## 2017-06-10 DIAGNOSIS — H468 Other optic neuritis: Secondary | ICD-10-CM

## 2017-06-12 ENCOUNTER — Other Ambulatory Visit: Payer: Self-pay | Admitting: Optometry

## 2017-06-12 DIAGNOSIS — H468 Other optic neuritis: Secondary | ICD-10-CM

## 2017-06-12 DIAGNOSIS — G379 Demyelinating disease of central nervous system, unspecified: Secondary | ICD-10-CM

## 2017-06-13 ENCOUNTER — Ambulatory Visit: Payer: BLUE CROSS/BLUE SHIELD | Admitting: Urology

## 2017-06-16 ENCOUNTER — Ambulatory Visit
Admission: RE | Admit: 2017-06-16 | Discharge: 2017-06-16 | Disposition: A | Discharge status: Home / Self Care | Payer: BLUE CROSS/BLUE SHIELD | Source: Ambulatory Visit | Attending: Optometry | Admitting: Optometry

## 2017-06-16 DIAGNOSIS — G4733 Obstructive sleep apnea (adult) (pediatric): Secondary | ICD-10-CM | POA: Diagnosis not present

## 2017-06-16 DIAGNOSIS — R6889 Other general symptoms and signs: Secondary | ICD-10-CM | POA: Diagnosis not present

## 2017-06-16 DIAGNOSIS — Z8673 Personal history of transient ischemic attack (TIA), and cerebral infarction without residual deficits: Secondary | ICD-10-CM | POA: Diagnosis not present

## 2017-06-16 DIAGNOSIS — Z8709 Personal history of other diseases of the respiratory system: Secondary | ICD-10-CM | POA: Diagnosis not present

## 2017-06-16 DIAGNOSIS — L718 Other rosacea: Secondary | ICD-10-CM | POA: Diagnosis not present

## 2017-06-16 DIAGNOSIS — R0609 Other forms of dyspnea: Secondary | ICD-10-CM | POA: Diagnosis not present

## 2017-06-16 DIAGNOSIS — L821 Other seborrheic keratosis: Secondary | ICD-10-CM | POA: Diagnosis not present

## 2017-06-16 DIAGNOSIS — G379 Demyelinating disease of central nervous system, unspecified: Secondary | ICD-10-CM

## 2017-06-16 DIAGNOSIS — H5340 Unspecified visual field defects: Secondary | ICD-10-CM | POA: Diagnosis not present

## 2017-06-16 DIAGNOSIS — H538 Other visual disturbances: Secondary | ICD-10-CM | POA: Diagnosis not present

## 2017-06-16 DIAGNOSIS — R51 Headache: Secondary | ICD-10-CM | POA: Diagnosis not present

## 2017-06-16 DIAGNOSIS — H468 Other optic neuritis: Secondary | ICD-10-CM

## 2017-06-16 LAB — POCT I-STAT CREATININE: CREATININE: 1 mg/dL (ref 0.44–1.00)

## 2017-06-16 MED ORDER — GADOBENATE DIMEGLUMINE 529 MG/ML IV SOLN
20.0000 mL | Freq: Once | INTRAVENOUS | Status: AC | PRN
  Administered 2017-06-16: 18 mL via INTRAVENOUS

## 2017-06-19 DIAGNOSIS — H53483 Generalized contraction of visual field, bilateral: Secondary | ICD-10-CM | POA: Diagnosis not present

## 2017-06-19 DIAGNOSIS — H468 Other optic neuritis: Secondary | ICD-10-CM | POA: Diagnosis not present

## 2017-07-02 DIAGNOSIS — J439 Emphysema, unspecified: Secondary | ICD-10-CM | POA: Diagnosis not present

## 2017-07-02 DIAGNOSIS — G4733 Obstructive sleep apnea (adult) (pediatric): Secondary | ICD-10-CM | POA: Diagnosis not present

## 2017-07-02 DIAGNOSIS — F320 Major depressive disorder, single episode, mild: Secondary | ICD-10-CM | POA: Diagnosis not present

## 2017-07-04 DIAGNOSIS — J449 Chronic obstructive pulmonary disease, unspecified: Secondary | ICD-10-CM | POA: Diagnosis not present

## 2017-07-11 ENCOUNTER — Encounter: Payer: Self-pay | Admitting: Physician Assistant

## 2017-07-15 DIAGNOSIS — F331 Major depressive disorder, recurrent, moderate: Secondary | ICD-10-CM | POA: Diagnosis not present

## 2017-07-18 DIAGNOSIS — F320 Major depressive disorder, single episode, mild: Secondary | ICD-10-CM | POA: Diagnosis not present

## 2017-08-01 DIAGNOSIS — F320 Major depressive disorder, single episode, mild: Secondary | ICD-10-CM | POA: Diagnosis not present

## 2017-08-02 DIAGNOSIS — G4733 Obstructive sleep apnea (adult) (pediatric): Secondary | ICD-10-CM | POA: Diagnosis not present

## 2017-08-02 DIAGNOSIS — J439 Emphysema, unspecified: Secondary | ICD-10-CM | POA: Diagnosis not present

## 2017-08-04 DIAGNOSIS — J449 Chronic obstructive pulmonary disease, unspecified: Secondary | ICD-10-CM | POA: Diagnosis not present

## 2017-08-11 DIAGNOSIS — Z9981 Dependence on supplemental oxygen: Secondary | ICD-10-CM | POA: Diagnosis not present

## 2017-08-11 DIAGNOSIS — R0609 Other forms of dyspnea: Secondary | ICD-10-CM | POA: Diagnosis not present

## 2017-08-11 DIAGNOSIS — J449 Chronic obstructive pulmonary disease, unspecified: Secondary | ICD-10-CM | POA: Diagnosis not present

## 2017-08-11 DIAGNOSIS — Z5181 Encounter for therapeutic drug level monitoring: Secondary | ICD-10-CM | POA: Diagnosis not present

## 2017-08-15 DIAGNOSIS — F320 Major depressive disorder, single episode, mild: Secondary | ICD-10-CM | POA: Diagnosis not present

## 2017-09-01 DIAGNOSIS — J439 Emphysema, unspecified: Secondary | ICD-10-CM | POA: Diagnosis not present

## 2017-09-01 DIAGNOSIS — G4733 Obstructive sleep apnea (adult) (pediatric): Secondary | ICD-10-CM | POA: Diagnosis not present

## 2017-09-03 DIAGNOSIS — J449 Chronic obstructive pulmonary disease, unspecified: Secondary | ICD-10-CM | POA: Diagnosis not present

## 2017-09-24 DIAGNOSIS — F320 Major depressive disorder, single episode, mild: Secondary | ICD-10-CM | POA: Diagnosis not present

## 2017-09-29 DIAGNOSIS — F331 Major depressive disorder, recurrent, moderate: Secondary | ICD-10-CM | POA: Diagnosis not present

## 2017-10-02 DIAGNOSIS — G4733 Obstructive sleep apnea (adult) (pediatric): Secondary | ICD-10-CM | POA: Diagnosis not present

## 2017-10-02 DIAGNOSIS — J439 Emphysema, unspecified: Secondary | ICD-10-CM | POA: Diagnosis not present

## 2017-10-04 DIAGNOSIS — J449 Chronic obstructive pulmonary disease, unspecified: Secondary | ICD-10-CM | POA: Diagnosis not present

## 2017-10-09 ENCOUNTER — Other Ambulatory Visit: Payer: Self-pay | Admitting: Physician Assistant

## 2017-10-09 DIAGNOSIS — E038 Other specified hypothyroidism: Secondary | ICD-10-CM

## 2017-10-15 DIAGNOSIS — G4733 Obstructive sleep apnea (adult) (pediatric): Secondary | ICD-10-CM | POA: Diagnosis not present

## 2017-11-01 DIAGNOSIS — G4733 Obstructive sleep apnea (adult) (pediatric): Secondary | ICD-10-CM | POA: Diagnosis not present

## 2017-11-01 DIAGNOSIS — J439 Emphysema, unspecified: Secondary | ICD-10-CM | POA: Diagnosis not present

## 2017-11-03 DIAGNOSIS — J449 Chronic obstructive pulmonary disease, unspecified: Secondary | ICD-10-CM | POA: Diagnosis not present

## 2017-11-05 DIAGNOSIS — G4733 Obstructive sleep apnea (adult) (pediatric): Secondary | ICD-10-CM | POA: Diagnosis not present

## 2017-11-05 DIAGNOSIS — E663 Overweight: Secondary | ICD-10-CM | POA: Diagnosis not present

## 2017-11-05 DIAGNOSIS — J449 Chronic obstructive pulmonary disease, unspecified: Secondary | ICD-10-CM | POA: Diagnosis not present

## 2017-11-05 DIAGNOSIS — R0609 Other forms of dyspnea: Secondary | ICD-10-CM | POA: Diagnosis not present

## 2017-11-09 DIAGNOSIS — J040 Acute laryngitis: Secondary | ICD-10-CM | POA: Diagnosis not present

## 2017-11-09 DIAGNOSIS — J069 Acute upper respiratory infection, unspecified: Secondary | ICD-10-CM | POA: Diagnosis not present

## 2017-11-13 ENCOUNTER — Ambulatory Visit: Payer: BLUE CROSS/BLUE SHIELD | Admitting: Physician Assistant

## 2017-11-13 ENCOUNTER — Encounter: Payer: Self-pay | Admitting: Physician Assistant

## 2017-11-13 VITALS — BP 108/62 | HR 85 | Temp 98.9°F | Resp 16 | Wt 213.0 lb

## 2017-11-13 DIAGNOSIS — Z7901 Long term (current) use of anticoagulants: Secondary | ICD-10-CM

## 2017-11-13 DIAGNOSIS — J441 Chronic obstructive pulmonary disease with (acute) exacerbation: Secondary | ICD-10-CM | POA: Diagnosis not present

## 2017-11-13 DIAGNOSIS — Z952 Presence of prosthetic heart valve: Secondary | ICD-10-CM

## 2017-11-13 MED ORDER — PREDNISONE 10 MG PO TABS: 10.0000 mg | ORAL_TABLET | Freq: Two times a day (BID) | ORAL | 0 refills | Status: AC

## 2017-11-13 MED ORDER — DOXYCYCLINE HYCLATE 100 MG PO TABS: 100.0000 mg | ORAL_TABLET | Freq: Two times a day (BID) | ORAL | 0 refills | Status: AC

## 2017-11-13 MED ORDER — HYDROCODONE-HOMATROPINE 5-1.5 MG/5ML PO SYRP: 5.0000 mL | ORAL_SOLUTION | Freq: Every evening | ORAL | 0 refills | Status: DC | PRN

## 2017-11-13 NOTE — Progress Notes (Signed)
[facility]  Chief Complaint  Patient presents with  . URI    Started about five days ago.     Subjective:    Patient ID: Nancy [name], female    DOB: 10-May-1961, 57 y.o.   MRN: [id number]  Upper Respiratory Infection: Nancy [name] is a 57 y.o. female with a past medical history significant for mechanical aortic valve with chronic warfarin therapy, COPD complaining of symptoms of a URI. Symptoms include congestion, cough and sore throat. Onset of symptoms was 5 days ago, gradually worsening since that time. She also c/o congestion, lightheadedness, nasal congestion, purulent nasal discharge, sinus pressure and sore throat for the past 5 days . She reports productive cough with purulent sputum. She is drinking plenty of fluids. Evaluation to date: Seen by urgent care twice prior this week. Treatment to date: cough suppressants. The treatment has provided no relief.   Review of Systems  Constitutional: Positive for fatigue. Negative for activity change, appetite change, chills, diaphoresis, fever and unexpected weight change.  HENT: Positive for congestion, ear discharge, ear pain, postnasal drip, rhinorrhea, sinus pressure, sinus pain, sore throat and voice change. Negative for nosebleeds, sneezing, tinnitus and trouble swallowing.   Eyes: Positive for discharge. Negative for photophobia, pain, redness, itching and visual disturbance.  Respiratory: Positive for cough, chest tightness and shortness of breath. Negative for apnea, choking, wheezing and stridor.   Gastrointestinal: Negative.   Neurological: Positive for headaches. Negative for dizziness and light-headedness.       Objective:   BP 108/62 (BP Location: Right Arm, Patient Position: Sitting, Cuff Size: Large)   Pulse 85   Temp 98.9 F (37.2 C) (Oral)   Resp 16   Wt 213 lb (96.6 kg)   SpO2 95%   BMI 32.39 kg/m   Patient Active Problem List   Diagnosis Date Noted  . Pain in right  foot 10/14/2016  . Leukocytes in urine 05/07/2016  . Allergic rhinitis 09/18/2015  . 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 Anxiety 05/09/2015  . Breast lump 05/09/2015  . CAFL (chronic airflow limitation) (HCC) 05/09/2015  . Clinical depression 05/09/2015  . Abnormal LFTs 05/09/2015  . Hydronephrosis 05/09/2015  . Adult hypothyroidism 05/09/2015  . BP (high blood pressure) 05/09/2015  . AI (aortic incompetence) 05/09/2015  . D-dimer, elevated 05/09/2015  . Breath shortness 05/09/2015  . 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 Avitaminosis D 05/09/2015  . Depression, major, single episode 05/09/2015  . Essential (primary) hypertension 05/09/2015  . Anxiety state 05/09/2015  . Aortic valve defect 05/09/2015  . Chronic obstructive pulmonary disease (HCC) 05/09/2015  . History of open heart surgery 11/22/2013  . 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 Aortic dissection (HCC) 07/03/2011  . Dissection of aorta (HCC) 07/03/2011    Outpatient Encounter Medications as of 11/13/2017  Medication Sig Note  . acetaminophen (TYLENOL) 500 MG tablet Take 2 tablets by mouth every 6 (six) hours as needed. 05/09/2015: Medication taken as needed.  Received from: [facility] Received Sig: Take by mouth.  Marland Kitchen acetaminophen-codeine (TYLENOL #3) 300-30 MG tablet Take 1 tablet by mouth every 6 (six) hours as needed for moderate pain.   Marland Kitchen albuterol-ipratropium (COMBIVENT) 18-103 MCG/ACT inhaler Inhale 2 puffs into the lungs every 6 (six) hours as needed.  05/09/2015: Received from: [facility] Received Sig: Inhale into the lungs.  Marland Kitchen amLODipine (NORVASC) 5 MG tablet Take 1 tablet by mouth daily. 05/09/2015:  Prescribed by cardiology [facility] Received Sig: Take by mouth.  Marland Kitchen. atorvastatin (LIPITOR) 10 MG tablet TAKE 1 TABLET (10 MG TOTAL) BY MOUTH ONCE DAILY. 05/01/2016: Received from:  External Pharmacy  . budesonide-formoterol (SYMBICORT) 160-4.5 MCG/ACT inhaler Inhale 2 puffs into the lungs daily.  05/09/2015: Pt unsure of dose. Prescribed by Dr. Meredeth IdeFleming. [facility]  . carvedilol (COREG) 25 MG tablet Take 1 tablet by mouth 2 (two) times daily. 05/09/2015: Prescribed by cardiology Received [facility] Received Sig: Take by mouth.  . furosemide (LASIX) 40 MG tablet Take 1 tablet by mouth daily. 05/09/2015: Prescribed by cardiology [facility] Received Sig: Take by mouth.  . levothyroxine (SYNTHROID, LEVOTHROID) 125 MCG tablet TAKE 1 TABLET BY MOUTH DAILY   . montelukast (SINGULAIR) 10 MG tablet Take 1 tablet (10 mg total) by mouth daily.   . valACYclovir (VALTREX) 1000 MG tablet Take 2 tablets (2,000 mg total) by mouth 2 (two) times daily. One day as needed   . warfarin (COUMADIN) 2.5 MG tablet Take 2 tablets by mouth as directed. 05/09/2015: Prescribed by cardiology Received [facility] Received Sig: Take by mouth.  Marland Kitchen. buPROPion (WELLBUTRIN XL) 300 MG 24 hr tablet Take 1 tablet by mouth daily. 05/09/2015: [facility]ceived [facility] Received Sig: Take by mouth.  . nitrofurantoin, macrocrystal-monohydrate, (MACROBID) 100 MG capsule Take 1 capsule (100 mg total) by mouth at bedtime.    No facility-administered encounter medications on file as of 11/13/2017.     Allergies  Allergen Reactions  . Codeine Nausea Only    Can take cough syrup.       Physical Exam  Constitutional: She is oriented to person, place, and time. She appears well-developed and well-nourished. She appears ill.  HENT:  Right Ear: External ear normal.  Left Ear: External ear normal.  Mouth/Throat: Oropharynx is clear and moist. No oropharyngeal exudate.  Eyes: Conjunctivae are normal.  Neck: Neck supple.  Cardiovascular: Normal rate.  Mechanical heart valve clicking.    Pulmonary/Chest: Effort normal. No respiratory distress. She has wheezes. She has no rales.  Decreased aeration throughout all lung fields. Scattered expiratory wheezes. No rales or ronchi.   Lymphadenopathy:    She has no cervical adenopathy.  Neurological: She is alert and oriented to person, place, and time.  Skin: Skin is warm and dry.  Psychiatric: She has a normal mood and affect. Her behavior is normal.       Assessment & Plan:  1. COPD exacerbation (HCC)  She is to continue her inhalers daily as prescribed by pulmonology and take abx and steroids as below. Sedating cough medicine, counseled on risks. She should come back to see us Monday for follow up COPD and recheck her INR as doxycycline does interact with this. Reminded of Saturday clinic and encouraged to call back if worsening.  - doxycycline (VIBRA-TABS) 100 MG tablet; Take 1 tablet (100 mg total) by mouth 2 (two) times daily for 7 days.  Dispense: 14 tablet; Refill: 0 - predniSONE (DELTASONE) 10 MG tablet; Take 1 tablet (10 mg total) by mouth 2 (two) times daily with a meal for 5 days.  Dispense: 10 tablet; Refill: 0 - HYDROcodone-homatropine (HYCODAN) 5-1.5 MG/5ML syrup; Take 5 mLs by mouth at bedtime as needed for cough.  Dispense: 120 mL; Refill: 0  2. Mechanical heart valve present   3. Chronic anticoagulation  Return in about 3 days (around 11/16/2017) for INR recheck, follow  COPD.  The entirety of the information documented in the History of Present Illness, Review of Systems and Physical Exam were personally obtained by me. Portions of this information were initially documented by Kavin Leech, CMA and reviewed by me for thoroughness and accuracy.

## 2017-11-13 NOTE — Patient Instructions (Signed)
Chronic Obstructive Pulmonary Disease Exacerbation  Chronic obstructive pulmonary disease (COPD) is a common lung problem. In COPD, the flow of air from the lungs is limited. COPD exacerbations are times that breathing gets worse and you need extra treatment. Without treatment they can be life threatening. If they happen often, your lungs can become more damaged. If your COPD gets worse, your doctor may treat you with:  ? Medicines.  ? Oxygen.  ? Different ways to clear your airway, such as using a mask.    Follow these instructions at home:  ? Do not smoke.  ? Avoid tobacco smoke and other things that bother your lungs.  ? If given, take your antibiotic medicine as told. Finish the medicine even if you start to feel better.  ? Only take medicines as told by your doctor.  ? Drink enough fluids to keep your pee (urine) clear or pale yellow (unless your doctor has told you not to).  ? Use a cool mist machine (vaporizer).  ? If you use oxygen or a machine that turns liquid medicine into a mist (nebulizer), continue to use them as told.  ? Keep up with shots (vaccinations) as told by your doctor.  ? Exercise regularly.  ? Eat healthy foods.  ? Keep all doctor visits as told.  Get help right away if:  ? You are very short of breath and it gets worse.  ? You have trouble talking.  ? You have bad chest pain.  ? You have blood in your spit (sputum).  ? You have a fever.  ? You keep throwing up (vomiting).  ? You feel weak, or you pass out (faint).  ? You feel confused.  ? You keep getting worse.  This information is not intended to replace advice given to you by your health care provider. Make sure you discuss any questions you have with your health care provider.  Document Released: 10/17/2011 Document Revised: 04/04/2016 Document Reviewed: 07/02/2013  Elsevier Interactive Patient Education ? 2017 Elsevier Inc.

## 2017-11-17 ENCOUNTER — Ambulatory Visit: Payer: BLUE CROSS/BLUE SHIELD | Admitting: Physician Assistant

## 2017-11-17 ENCOUNTER — Encounter: Payer: Self-pay | Admitting: Physician Assistant

## 2017-11-17 VITALS — BP 116/64 | HR 85 | Temp 98.2°F | Resp 16 | Wt 216.0 lb

## 2017-11-17 DIAGNOSIS — J441 Chronic obstructive pulmonary disease with (acute) exacerbation: Secondary | ICD-10-CM | POA: Diagnosis not present

## 2017-11-17 DIAGNOSIS — Z952 Presence of prosthetic heart valve: Secondary | ICD-10-CM

## 2017-11-17 LAB — POCT INR
INR: 4.9
PT: 58.3

## 2017-11-17 NOTE — Progress Notes (Signed)
Patient: Nancy [name] Female    DOB: 1961/03/21   57 y.o.   MRN: [id number] Visit Date: 11/17/2017  Today's Provider: Margaretann Loveless, PA-C   Chief Complaint  Patient presents with  . COPD    Three day follow up   Subjective:    COPD  She complains of cough, shortness of breath and wheezing. This is a new problem. The current episode started in the past 7 days. The problem has been gradually improving (Pt reports about 50% improvement. ). The cough is productive of sputum. Associated symptoms include dyspnea on exertion and malaise/fatigue. Pertinent negatives include no appetite change, ear congestion, ear pain, fever, headaches, heartburn, myalgias, nasal congestion, orthopnea, PND, postnasal drip, rhinorrhea, sneezing, sore throat, sweats, trouble swallowing or weight loss. Her past medical history is significant for COPD.   Patient was started on Doxycycline on 11/13/17 and returned today to see if she was improving as well as have INR rechecked.     Allergies  Allergen Reactions  . Codeine Nausea Only    Can take cough syrup.     Current Outpatient Medications:  .  acetaminophen (TYLENOL) 500 MG tablet, Take 2 tablets by mouth every 6 (six) hours as needed., Disp: , Rfl:  .  acetaminophen-codeine (TYLENOL #3) 300-30 MG tablet, Take 1 tablet by mouth every 6 (six) hours as needed for moderate pain., Disp: 30 tablet, Rfl: 0 .  albuterol-ipratropium (COMBIVENT) 18-103 MCG/ACT inhaler, Inhale 2 puffs into the lungs every 6 (six) hours as needed. , Disp: , Rfl:  .  amLODipine (NORVASC) 5 MG tablet, Take 1 tablet by mouth daily., Disp: , Rfl:  .  atorvastatin (LIPITOR) 10 MG tablet, TAKE 1 TABLET (10 MG TOTAL) BY MOUTH ONCE DAILY., Disp: , Rfl: 11 .  budesonide-formoterol (SYMBICORT) 160-4.5 MCG/ACT inhaler, Inhale 2 puffs into the lungs daily. , Disp: , Rfl:  .  buPROPion (WELLBUTRIN XL) 300 MG 24 hr tablet, Take 1 tablet by mouth daily., Disp: , Rfl:  .  carvedilol (COREG)  25 MG tablet, Take 1 tablet by mouth 2 (two) times daily., Disp: , Rfl:  .  doxycycline (VIBRA-TABS) 100 MG tablet, Take 1 tablet (100 mg total) by mouth 2 (two) times daily for 7 days., Disp: 14 tablet, Rfl: 0 .  furosemide (LASIX) 40 MG tablet, Take 1 tablet by mouth daily., Disp: , Rfl:  .  HYDROcodone-homatropine (HYCODAN) 5-1.5 MG/5ML syrup, Take 5 mLs by mouth at bedtime as needed for cough., Disp: 120 mL, Rfl: 0 .  levothyroxine (SYNTHROID, LEVOTHROID) 125 MCG tablet, TAKE 1 TABLET BY MOUTH DAILY, Disp: 90 tablet, Rfl: 1 .  montelukast (SINGULAIR) 10 MG tablet, Take 1 tablet (10 mg total) by mouth daily., Disp: 90 tablet, Rfl: 1 .  nitrofurantoin, macrocrystal-monohydrate, (MACROBID) 100 MG capsule, Take 1 capsule (100 mg total) by mouth at bedtime., Disp: 30 capsule, Rfl: 0 .  predniSONE (DELTASONE) 10 MG tablet, Take 1 tablet (10 mg total) by mouth 2 (two) times daily with a meal for 5 days., Disp: 10 tablet, Rfl: 0 .  valACYclovir (VALTREX) 1000 MG tablet, Take 2 tablets (2,000 mg total) by mouth 2 (two) times daily. One day as needed, Disp: 16 tablet, Rfl: 1 .  warfarin (COUMADIN) 2.5 MG tablet, Take 2 tablets by mouth as directed., Disp: , Rfl:   Review of Systems  Constitutional: Positive for fatigue and malaise/fatigue. Negative for activity change, appetite change, chills, diaphoresis, fever, unexpected weight change and weight  loss.  HENT: Negative.  Negative for ear pain, postnasal drip, rhinorrhea, sneezing, sore throat and trouble swallowing.   Eyes: Negative.   Respiratory: Positive for cough, chest tightness, shortness of breath and wheezing. Negative for apnea, choking and stridor.   Cardiovascular: Positive for dyspnea on exertion. Negative for PND.  Gastrointestinal: Negative.  Negative for heartburn.  Musculoskeletal: Negative for myalgias.  Neurological: Positive for light-headedness. Negative for dizziness and headaches.    Social History   Tobacco Use  . Smoking  status: Former Smoker    Last attempt to quit: 03/11/2016    Years since quitting: 1.6  . Smokeless tobacco: Never Used  . Tobacco comment: patient states  that she is working at her own pace to quit smoking  Substance Use Topics  . Alcohol use: Yes    Alcohol/week: 0.0 oz    Comment: OCCASIONALLY   Objective:   BP 116/64 (BP Location: Right Arm, Patient Position: Sitting, Cuff Size: Large)   Pulse 85   Temp 98.2 F (36.8 C) (Oral)   Resp 16   Wt 216 lb (98 kg)   SpO2 97%   BMI 32.84 kg/m  Vitals:   11/17/17 0837  BP: 116/64  Pulse: 85  Resp: 16  Temp: 98.2 F (36.8 C)  TempSrc: Oral  SpO2: 97%  Weight: 216 lb (98 kg)     Physical Exam  Constitutional: She appears well-developed and well-nourished. No distress.  HENT:  Head: Normocephalic and atraumatic.  Right Ear: Hearing, tympanic membrane, external ear and ear canal normal.  Left Ear: Hearing, tympanic membrane, external ear and ear canal normal.  Nose: Nose normal.  Mouth/Throat: Uvula is midline, oropharynx is clear and moist and mucous membranes are normal. No oropharyngeal exudate.  Eyes: Conjunctivae are normal. Pupils are equal, round, and reactive to light. Right eye exhibits no discharge. Left eye exhibits no discharge. No scleral icterus.  Neck: Normal range of motion. Neck supple. No tracheal deviation present. No thyromegaly present.  Cardiovascular: Normal rate and regular rhythm. Exam reveals no gallop and no friction rub.  Murmur (due to mechanical valve) heard. Pulmonary/Chest: Effort normal and breath sounds normal. No stridor. No respiratory distress. She has no wheezes. She has no rales.  Lymphadenopathy:    She has no cervical adenopathy.  Skin: Skin is warm and dry. She is not diaphoretic.  Vitals reviewed.     Assessment & Plan:     1. Chronic obstructive pulmonary disease with acute exacerbation (HCC) Improving. Continue doxy unitl completed. Add mucinex DM for cough and congestion during  the day. Hycodan prn for nighttime cough.   2. Mechanical heart valve present INR today up at 4.9. Will have patient hold warfarin today, take tomorrow and hold again on Weds. We will have her return in one week for recheck of INR. I will send note to Dr. Darrold JunkerParaschos to inform him of us watching her INR while she is on Doxycyline.  - POCT INR       Margaretann LovelessJennifer M Burnette, PA-C  [facility]

## 2017-11-24 ENCOUNTER — Ambulatory Visit (INDEPENDENT_AMBULATORY_CARE_PROVIDER_SITE_OTHER): Payer: BLUE CROSS/BLUE SHIELD | Admitting: Physician Assistant

## 2017-11-24 DIAGNOSIS — I359 Nonrheumatic aortic valve disorder, unspecified: Secondary | ICD-10-CM

## 2017-11-24 NOTE — Progress Notes (Signed)
   Subjective:    Patient ID: Nancy [name], female    DOB: 07/30/1961, 57 y.o.   MRN: [id number]  Nancy [name] is a 57 y.o. female presenting on 11/24/2017 for PT/INR Check.   HPI   Nancy [name] is a 57 year old with a history of COPD, mechanical aortic valve on chronic anticoagulation with Coumadin who is presenting today for follow up of her PT/INR. Two weeks ago, she was started on doxycycline for a COPD exacerbation. Recheck of her INR on 11/17/2017 was 4.9. She was instructed to hold her daily 5 mg on day 1, take it on day 2, hold it again on day 3, and continue with 5 mg daily from then on. She presents today after having taken 5 mg warfarin daily for four consecutive days. Her INR is 1.3.   Social History   Tobacco Use  . Smoking status: Former Smoker    Last attempt to quit: 03/11/2016    Years since quitting: 1.7  . Smokeless tobacco: Never Used  . Tobacco comment: patient states  that she is working at her own pace to quit smoking  Substance Use Topics  . Alcohol use: Yes    Alcohol/week: 0.0 oz    Comment: OCCASIONALLY  . Drug use: No    Review of Systems Per HPI unless specifically indicated above     Objective:    There were no vitals taken for this visit.  Wt Readings from Last 3 Encounters:  11/17/17 216 lb (98 kg)  11/13/17 213 lb (96.6 kg)  06/14/16 199 lb (90.3 kg)    Physical Exam Results for orders placed or performed in visit on 11/17/17  POCT INR  Result Value Ref Range   INR 4.9    PT 58.3       Assessment & Plan:   Follow up plan:  1. Chronic warfarin therapy, mechanical aortic valve  She is on chronic warfarin therapy for a mechanical aortic valve with goal INR between 2.0 and 3.0. Today she is subtherapeutic. I have discussed this case with Dr. Sherrie MustacheFisher, who recommends increasing dose to 7.5mg  daily today and tomorrow and then resuming 5 mg daily from then on. We have discussed the risk of possible stroke at subtherapeutic INR, however, since she  has been taking warfarin consistently for four days and due to the drug's long half life, we anticipate her INR [name] continue to increase significantly by tomorrow. She declines bridging therapy with Lovenox. We [name] see her on Thursday for repeat INR.  [facility] 11/25/2017, 2:21 PM

## 2017-11-25 DIAGNOSIS — F320 Major depressive disorder, single episode, mild: Secondary | ICD-10-CM | POA: Diagnosis not present

## 2017-11-25 LAB — POCT INR
INR: 1.3
PT: 15.4

## 2017-12-02 DIAGNOSIS — J439 Emphysema, unspecified: Secondary | ICD-10-CM | POA: Diagnosis not present

## 2017-12-02 DIAGNOSIS — G4733 Obstructive sleep apnea (adult) (pediatric): Secondary | ICD-10-CM | POA: Diagnosis not present

## 2017-12-04 DIAGNOSIS — J449 Chronic obstructive pulmonary disease, unspecified: Secondary | ICD-10-CM | POA: Diagnosis not present

## 2017-12-16 DIAGNOSIS — F331 Major depressive disorder, recurrent, moderate: Secondary | ICD-10-CM | POA: Diagnosis not present

## 2017-12-26 DIAGNOSIS — F320 Major depressive disorder, single episode, mild: Secondary | ICD-10-CM | POA: Diagnosis not present

## 2018-01-02 ENCOUNTER — Encounter: Payer: Self-pay | Admitting: Family Medicine

## 2018-01-02 ENCOUNTER — Ambulatory Visit (INDEPENDENT_AMBULATORY_CARE_PROVIDER_SITE_OTHER): Payer: BLUE CROSS/BLUE SHIELD | Admitting: Family Medicine

## 2018-01-02 VITALS — BP 110/64 | HR 83 | Temp 98.1°F | Resp 16 | Ht 67.0 in | Wt 214.0 lb

## 2018-01-02 DIAGNOSIS — J439 Emphysema, unspecified: Secondary | ICD-10-CM | POA: Diagnosis not present

## 2018-01-02 DIAGNOSIS — Z0000 Encounter for general adult medical examination without abnormal findings: Secondary | ICD-10-CM

## 2018-01-02 DIAGNOSIS — Z0001 Encounter for general adult medical examination with abnormal findings: Secondary | ICD-10-CM | POA: Diagnosis not present

## 2018-01-02 DIAGNOSIS — Z952 Presence of prosthetic heart valve: Secondary | ICD-10-CM | POA: Diagnosis not present

## 2018-01-02 DIAGNOSIS — Q6210 Congenital occlusion of ureter, unspecified: Secondary | ICD-10-CM | POA: Diagnosis not present

## 2018-01-02 DIAGNOSIS — E039 Hypothyroidism, unspecified: Secondary | ICD-10-CM | POA: Diagnosis not present

## 2018-01-02 DIAGNOSIS — N1330 Unspecified hydronephrosis: Secondary | ICD-10-CM | POA: Diagnosis not present

## 2018-01-02 DIAGNOSIS — R339 Retention of urine, unspecified: Secondary | ICD-10-CM | POA: Insufficient documentation

## 2018-01-02 DIAGNOSIS — I10 Essential (primary) hypertension: Secondary | ICD-10-CM | POA: Diagnosis not present

## 2018-01-02 DIAGNOSIS — G4733 Obstructive sleep apnea (adult) (pediatric): Secondary | ICD-10-CM | POA: Diagnosis not present

## 2018-01-02 NOTE — Assessment & Plan Note (Signed)
New problem No symptoms of UTI Referral to urology for further workup, especially in the setting of previous hydronephrosis and ureter obstruction

## 2018-01-02 NOTE — Assessment & Plan Note (Signed)
Well-controlled Continue current medications Patient reports this is followed by cardiology, so expressed that she likely needs a follow-up appointment Will check CMP today

## 2018-01-02 NOTE — Assessment & Plan Note (Signed)
History of Now with other urinary symptoms, so will refer to urology as below

## 2018-01-02 NOTE — Assessment & Plan Note (Signed)
Patient is symptomatic It has been over a year since her TSH was checked We will check TSH today For now, continue on current Synthroid dose, but will adjust pending TSH results

## 2018-01-02 NOTE — Assessment & Plan Note (Signed)
History of Now with urinary symptoms, so will refer to urology as below

## 2018-01-02 NOTE — Patient Instructions (Signed)
Preventive Care 40-64 Years, Female Preventive care refers to lifestyle choices and visits with your health care provider that can promote health and wellness. What does preventive care include?  A yearly physical exam. This is also called an annual well check.  Dental exams once or twice a year.  Routine eye exams. Ask your health care provider how often you should have your eyes checked.  Personal lifestyle choices, including: ? Daily care of your teeth and gums. ? Regular physical activity. ? Eating a healthy diet. ? Avoiding tobacco and drug use. ? Limiting alcohol use. ? Practicing safe sex. ? Taking low-dose aspirin daily starting at age 58. ? Taking vitamin and mineral supplements as recommended by your health care provider. What happens during an annual well check? The services and screenings done by your health care provider during your annual well check will depend on your age, overall health, lifestyle risk factors, and family history of disease. Counseling Your health care provider may ask you questions about your:  Alcohol use.  Tobacco use.  Drug use.  Emotional well-being.  Home and relationship well-being.  Sexual activity.  Eating habits.  Work and work Statistician.  Method of birth control.  Menstrual cycle.  Pregnancy history.  Screening You may have the following tests or measurements:  Height, weight, and BMI.  Blood pressure.  Lipid and cholesterol levels. These may be checked every 5 years, or more frequently if you are over 81 years old.  Skin check.  Lung cancer screening. You may have this screening every year starting at age 78 if you have a 30-pack-year history of smoking and currently smoke or have quit within the past 15 years.  Fecal occult blood test (FOBT) of the stool. You may have this test every year starting at age 65.  Flexible sigmoidoscopy or colonoscopy. You may have a sigmoidoscopy every 5 years or a colonoscopy  every 10 years starting at age 30.  Hepatitis C blood test.  Hepatitis B blood test.  Sexually transmitted disease (STD) testing.  Diabetes screening. This is done by checking your blood sugar (glucose) after you have not eaten for a while (fasting). You may have this done every 1-3 years.  Mammogram. This may be done every 1-2 years. Talk to your health care provider about when you should start having regular mammograms. This may depend on whether you have a family history of breast cancer.  BRCA-related cancer screening. This may be done if you have a family history of breast, ovarian, tubal, or peritoneal cancers.  Pelvic exam and Pap test. This may be done every 3 years starting at age 80. Starting at age 36, this may be done every 5 years if you have a Pap test in combination with an HPV test.  Bone density scan. This is done to screen for osteoporosis. You may have this scan if you are at high risk for osteoporosis.  Discuss your test results, treatment options, and if necessary, the need for more tests with your health care provider. Vaccines Your health care provider may recommend certain vaccines, such as:  Influenza vaccine. This is recommended every year.  Tetanus, diphtheria, and acellular pertussis (Tdap, Td) vaccine. You may need a Td booster every 10 years.  Varicella vaccine. You may need this if you have not been vaccinated.  Zoster vaccine. You may need this after age 5.  Measles, mumps, and rubella (MMR) vaccine. You may need at least one dose of MMR if you were born in  1955 or later. You may also need a second dose.  Pneumococcal 13-valent conjugate (PCV13) vaccine. You may need this if you have certain conditions and were not previously vaccinated.  Pneumococcal polysaccharide (PPSV23) vaccine. You may need one or two doses if you smoke cigarettes or if you have certain conditions.  Meningococcal vaccine. You may need this if you have certain  conditions.  Hepatitis A vaccine. You may need this if you have certain conditions or if you travel or work in places where you may be exposed to hepatitis A.  Hepatitis B vaccine. You may need this if you have certain conditions or if you travel or work in places where you may be exposed to hepatitis B.  Haemophilus influenzae type b (Hib) vaccine. You may need this if you have certain conditions.  Talk to your health care provider about which screenings and vaccines you need and how often you need them. This information is not intended to replace advice given to you by your health care provider. Make sure you discuss any questions you have with your health care provider. Document Released: 11/24/2015 Document Revised: 07/17/2016 Document Reviewed: 08/29/2015 Elsevier Interactive Patient Education  2018 Elsevier Inc.  

## 2018-01-02 NOTE — Progress Notes (Signed)
Patient: Nancy [name], Female    DOB: Nov 02, 1961, 57 y.o.   MRN: [id number] Visit Date: 01/02/2018  Today's Provider: Shirlee Latch, MD   I, Joslyn Hy, CMA, am acting as scribe for Shirlee Latch, MD.  Chief Complaint  Patient presents with  . Annual Exam   Subjective:    Annual physical exam Nancy [name] is a 57 y.o. female who presents today for health maintenance and complete physical. She feels fairly well. She is still experiencing hot flashes since she has had her hysterectomy in 2007. She is also concerned about weight. She is currently taking Phentermine, which was prescribed by Dr. Meredeth Ide to help improve breathing. She would also like to discuss involuntary urination.    She reports exercising none. She reports she is sleeping fairly well. Is using CPAP, but has difficulty with mask adjustments.   Last pap- 06/12/2012- NIL. S/P hysterectomy in 2007. Not due to cancer per patient. No cervix.  From what records we have from [facility], it appears that the patient had a large right pelvic mass that was compressing her ureter and leading to hydronephrosis.  She was being worked up for ovarian cancer at that time and also having an endometrial biopsy.  Op note not available Last mammogram- 08/14/2015- BI-RADS 1 Last colonoscopy 08/31/2013- repeat 10 years ----------------------------------------------------------------- Patient reports that her pulmonologist who started her on phentermine.  He is hopeful that her chronic airflow limitation may be improved with weight loss.  Patient is also followed by cardiothoracic surgery and cardiology for her blood pressure, cholesterol, aortic incompetence and aortic valve replacement.  She is on Coumadin chronically and her INR is followed by cardiology.  She admits that she has not been back to see her cardiologist in some time to have this monitored.  She wonders if it can be checked today and sent to her  cardiologist.  Hypothyroidism: Patient is taking her Synthroid regularly.  She denies any dry skin, hair loss, diarrhea.  She does intermittently have constipation and has hemorrhoids from this.  She reports that she is warm all the time and will often sweat due to this.  Patient also complains of urinary issues today.  She reports that for the last 6-7 months, she has had incomplete emptying of her bladder.  This happens about 90% of the time.  She has some trouble when she stands up after urinating.  She occasionally has urge incontinence and does not make it to the bathroom.  With strong coughing, she will occasionally leak, but this is rare.  She reports drinking a lot of caffeine with 3-4 diet sodas daily and 2-3 cups of tea daily.  She does not drink water.  She was previously followed by Dr. Achilles Dunk for her hydronephrosis.  She never had to have her ureter stented.  Review of Systems  Constitutional: Negative.   HENT: Negative.   Eyes: Negative.   Respiratory: Positive for shortness of breath. Negative for apnea, cough, choking, chest tightness, wheezing and stridor.   Cardiovascular: Negative.   Gastrointestinal: Negative.   Endocrine: Negative.   Genitourinary: Positive for enuresis and urgency. Negative for decreased urine volume, difficulty urinating, dyspareunia, dysuria, flank pain, frequency, genital sores, hematuria, menstrual problem, pelvic pain, vaginal bleeding, vaginal discharge and vaginal pain.  Musculoskeletal: Negative.   Skin: Negative.   Allergic/Immunologic: Negative.   Neurological: Positive for headaches. Negative for dizziness, tremors, seizures, syncope, facial asymmetry, speech difficulty, weakness, light-headedness and numbness.  Hematological: Negative.  Psychiatric/Behavioral: Negative.     Social History      She  reports that she quit smoking about 21 months ago. she has never used smokeless tobacco. She reports that she drinks alcohol. She reports that she  does not use drugs.       Social History   Socioeconomic History  . Marital status: Single    Spouse name: Not on file  . Number of children: 0  . Years of education: 79  . Highest education level: High school graduate  Social Needs  . Financial resource strain: Not hard at all  . Food insecurity - worry: Never true  . Food insecurity - inability: Never true  . Transportation needs - medical: No  . Transportation needs - non-medical: No  Occupational History    Employer: [employer]  Tobacco Use  . Smoking status: Former Smoker    Last attempt to quit: 03/11/2016    Years since quitting: 1.8  . Smokeless tobacco: Never Used  . Tobacco comment: patient states  that she is working at her own pace to quit smoking  Substance and Sexual Activity  . Alcohol use: Yes    Alcohol/week: 0.0 oz    Comment: OCCASIONALLY  . Drug use: No  . Sexual activity: Not on file  Other Topics Concern  . Not on file  Social History Narrative  . Not on file    Past Medical History:  Diagnosis Date  . Allergy   . Depression   . Hypertension   . Sleep apnea   . Thyroid disease      Patient Active Problem List   Diagnosis Date Noted  . Pain in right foot 10/14/2016  . Leukocytes in urine 05/07/2016  . Allergic rhinitis 09/18/2015  . 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 Anxiety 05/09/2015  . Breast lump 05/09/2015  . CAFL (chronic airflow limitation) (HCC) 05/09/2015  . Clinical depression 05/09/2015  . Abnormal LFTs 05/09/2015  . Hydronephrosis 05/09/2015  . Adult hypothyroidism 05/09/2015  . BP (high blood pressure) 05/09/2015  . AI (aortic incompetence) 05/09/2015  . D-dimer, elevated 05/09/2015  . Breath shortness 05/09/2015  . 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 Avitaminosis D 05/09/2015  . Depression, major, single episode 05/09/2015  . Essential (primary) hypertension 05/09/2015  . Anxiety state 05/09/2015  . Aortic valve defect  05/09/2015  . Chronic obstructive pulmonary disease (HCC) 05/09/2015  . History of open heart surgery 11/22/2013  . 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 Aortic dissection (HCC) 07/03/2011    Past Surgical History:  Procedure Laterality Date  . ABDOMINAL HYSTERECTOMY  2007  . AORTIC VALVE REPLACEMENT  06/2009  . APPENDECTOMY  1985  . CARPAL TUNNEL RELEASE Left 04/12/2013  . CATARACT EXTRACTION Right 1994  . FOOT SURGERY Left 1997-1998    Family History        Family Status  Relation Name Status  . Mother  Alive  . Father  Deceased  . MGM  Deceased  . PGM  Deceased  . MGF  Deceased  . PGF  Deceased  . Sister  Alive  . Neg Hx  (Not Specified)        Her family history includes Breast cancer (age of onset: 5) in her mother; Colon cancer in her maternal grandmother; Healthy in her sister; Heart disease in her father and paternal grandmother; Hypertension in her father; Pneumonia in her mother. There is no history of Prostate  cancer or Kidney cancer.      Allergies  Allergen Reactions  . Codeine Nausea Only    Can take cough syrup.     Current Outpatient Medications:  .  acetaminophen (TYLENOL) 500 MG tablet, Take 2 tablets by mouth every 6 (six) hours as needed., Disp: , Rfl:  .  acetaminophen-codeine (TYLENOL #3) 300-30 MG tablet, Take 1 tablet by mouth every 6 (six) hours as needed for moderate pain., Disp: 30 tablet, Rfl: 0 .  albuterol-ipratropium (COMBIVENT) 18-103 MCG/ACT inhaler, Inhale 2 puffs into the lungs every 6 (six) hours as needed. , Disp: , Rfl:  .  amLODipine (NORVASC) 5 MG tablet, Take 1 tablet by mouth daily., Disp: , Rfl:  .  budesonide-formoterol (SYMBICORT) 160-4.5 MCG/ACT inhaler, Inhale 2 puffs into the lungs daily. , Disp: , Rfl:  .  buPROPion (WELLBUTRIN XL) 300 MG 24 hr tablet, Take 1 tablet by mouth daily., Disp: , Rfl:  .  carvedilol (COREG) 25 MG tablet, Take 1 tablet by mouth 2 (two) times daily., Disp: , Rfl:  .   furosemide (LASIX) 40 MG tablet, Take 1 tablet by mouth daily., Disp: , Rfl:  .  levothyroxine (SYNTHROID, LEVOTHROID) 125 MCG tablet, TAKE 1 TABLET BY MOUTH DAILY, Disp: 90 tablet, Rfl: 1 .  phentermine 37.5 MG capsule, Take 37.5 mg by mouth every morning., Disp: , Rfl:  .  valACYclovir (VALTREX) 1000 MG tablet, Take 2 tablets (2,000 mg total) by mouth 2 (two) times daily. One day as needed, Disp: 16 tablet, Rfl: 1 .  warfarin (COUMADIN) 2.5 MG tablet, Take 2 tablets by mouth as directed., Disp: , Rfl:  .  atorvastatin (LIPITOR) 10 MG tablet, TAKE 1 TABLET (10 MG TOTAL) BY MOUTH ONCE DAILY., Disp: , Rfl: 11 .  montelukast (SINGULAIR) 10 MG tablet, Take 1 tablet (10 mg total) by mouth daily. (Patient not taking: Reported on 01/02/2018), Disp: 90 tablet, Rfl: 1   Patient Care Team: Margaretann Loveless, PA-C as PCP - General (Family Medicine)      Objective:   Vitals: BP 110/64 (BP Location: Left Arm, Patient Position: Sitting, Cuff Size: Large)   Pulse 83   Temp 98.1 F (36.7 C) (Oral)   Resp 16   Ht 5\' 7"  (1.702 m)   Wt 214 lb (97.1 kg)   SpO2 97%   BMI 33.52 kg/m    Vitals:   01/02/18 1023  BP: 110/64  Pulse: 83  Resp: 16  Temp: 98.1 F (36.7 C)  TempSrc: Oral  SpO2: 97%  Weight: 214 lb (97.1 kg)  Height: 5\' 7"  (1.702 m)     Physical Exam  Constitutional: She is oriented to person, place, and time. She appears well-developed and well-nourished. No distress.  HENT:  Head: Normocephalic and atraumatic.  Right Ear: External ear normal.  Left Ear: External ear normal.  Nose: Nose normal.  Mouth/Throat: Oropharynx is clear and moist. No oropharyngeal exudate.  Eyes: Conjunctivae and EOM are normal. Pupils are equal, round, and reactive to light. Right eye exhibits no discharge. Left eye exhibits no discharge. No scleral icterus.  Neck: Neck supple. No thyromegaly present.  Cardiovascular: Normal rate, regular rhythm, normal heart sounds and intact distal pulses.  No  murmur heard. Pulmonary/Chest: Effort normal and breath sounds normal. No respiratory distress. She has no wheezes. She has no rales.  Abdominal: Soft. She exhibits no distension. There is no tenderness. There is no rebound and no guarding.  Genitourinary:  Genitourinary Comments: Breasts: breasts appear normal,  no suspicious masses, no skin or nipple changes or axillary nodes. Gyn: Patient declines  Musculoskeletal: She exhibits no edema or deformity.  Lymphadenopathy:    She has no cervical adenopathy.  Neurological: She is alert and oriented to person, place, and time.  Skin: Skin is warm and dry. No rash noted.  Psychiatric: She has a normal mood and affect. Her behavior is normal.  Vitals reviewed.    Depression Screen PHQ 2/9 Scores 01/02/2018 08/24/2015  PHQ - 2 Score 1 -  Exception Documentation - Other- indicate reason in comment box  Not completed - pt seen by Dr. Maryruth Bun     Assessment & Plan:     Routine Health Maintenance and Physical Exam  Exercise Activities and Dietary recommendations Goals    . Exercise 150 minutes per week (moderate activity)       Immunization History  Administered Date(s) Administered  . Td 11/01/2005  . Tdap 06/12/2012    Health Maintenance  Topic Date Due  . Hepatitis C Screening  03-Mar-1961  . HIV Screening  03/16/1978  . PAP SMEAR  03/16/1984  . COLONOSCOPY  03/16/2013  . MAMMOGRAM  08/13/2017  . INFLUENZA VACCINE  02/08/2018 (Originally 06/11/2017)  . TETANUS/TDAP  06/12/2022     Discussed health benefits of physical activity, and encouraged her to engage in regular exercise appropriate for her age and condition.   Discussed thatshe may require pap smear to screen for vaginal cancer and pelvic exam to ensure that cervix is surgically absent.  Patient declines pelvic exam.  Will request op note from GYN   -------------------------------------------------------------------- Problem List Items Addressed This Visit       Cardiovascular and Mediastinum   Essential (primary) hypertension    Well-controlled Continue current medications Patient reports this is followed by cardiology, so expressed that she likely needs a follow-up appointment Will check CMP today      Relevant Orders   Comprehensive metabolic panel   Lipid panel     Endocrine   Adult hypothyroidism    Patient is symptomatic It has been over a year since her TSH was checked We will check TSH today For now, continue on current Synthroid dose, but will adjust pending TSH results      Relevant Orders   TSH     Genitourinary   Hydronephrosis    History of Now with urinary symptoms, so will refer to urology as below      Relevant Orders   Ambulatory referral to Urology   Stenosis of ureter    History of Now with other urinary symptoms, so will refer to urology as below      Relevant Orders   Ambulatory referral to Urology   Urinary retention    New problem No symptoms of UTI Referral to urology for further workup, especially in the setting of previous hydronephrosis and ureter obstruction      Relevant Orders   Ambulatory referral to Urology     Other   Mechanical heart valve present    Followed by cardiology and cardiothoracic surgery at [facility] Will recheck INR today Chronically anticoagulated with Coumadin Encourage patient to follow-up with her cardiologist who has been following her anticoagulation      Relevant Orders   INR/PT   CBC w/Diff/Platelet    Other Visit Diagnoses    Encounter for annual physical exam    -  Primary   Relevant Orders   Comprehensive metabolic panel   Lipid panel   CBC w/Diff/Platelet  Return in about 6 months (around 07/02/2018) for Hypothyroidism f/u.   The entirety of the information documented in the History of Present Illness, Review of Systems and Physical Exam were personally obtained by me. Portions of this information were initially documented by Irving BurtonEmily Ratchford, CMA  and reviewed by me for thoroughness and accuracy.    Erasmo DownerBacigalupo, Asya Derryberry M, MD, MPH [facility] 01/02/2018 1:29 PM

## 2018-01-02 NOTE — Assessment & Plan Note (Signed)
[facility] Will recheck INR today Chronically anticoagulated with Coumadin Encourage patient to follow-up with her cardiologist who has been following her anticoagulation

## 2018-01-04 DIAGNOSIS — J449 Chronic obstructive pulmonary disease, unspecified: Secondary | ICD-10-CM | POA: Diagnosis not present

## 2018-01-05 DIAGNOSIS — Z952 Presence of prosthetic heart valve: Secondary | ICD-10-CM | POA: Diagnosis not present

## 2018-01-05 DIAGNOSIS — N6310 Unspecified lump in the right breast, unspecified quadrant: Secondary | ICD-10-CM | POA: Diagnosis not present

## 2018-01-05 DIAGNOSIS — Z8679 Personal history of other diseases of the circulatory system: Secondary | ICD-10-CM | POA: Diagnosis not present

## 2018-01-05 DIAGNOSIS — I708 Atherosclerosis of other arteries: Secondary | ICD-10-CM | POA: Diagnosis not present

## 2018-01-05 DIAGNOSIS — Z0000 Encounter for general adult medical examination without abnormal findings: Secondary | ICD-10-CM | POA: Diagnosis not present

## 2018-01-05 DIAGNOSIS — Z9889 Other specified postprocedural states: Secondary | ICD-10-CM | POA: Diagnosis not present

## 2018-01-05 DIAGNOSIS — I716 Thoracoabdominal aortic aneurysm, without rupture: Secondary | ICD-10-CM | POA: Diagnosis not present

## 2018-01-05 DIAGNOSIS — I719 Aortic aneurysm of unspecified site, without rupture: Secondary | ICD-10-CM | POA: Diagnosis not present

## 2018-01-05 DIAGNOSIS — N2881 Hypertrophy of kidney: Secondary | ICD-10-CM | POA: Diagnosis not present

## 2018-01-05 DIAGNOSIS — E039 Hypothyroidism, unspecified: Secondary | ICD-10-CM | POA: Diagnosis not present

## 2018-01-05 DIAGNOSIS — I10 Essential (primary) hypertension: Secondary | ICD-10-CM | POA: Diagnosis not present

## 2018-01-06 ENCOUNTER — Telehealth: Payer: Self-pay

## 2018-01-06 DIAGNOSIS — H18822 Corneal disorder due to contact lens, left eye: Secondary | ICD-10-CM | POA: Diagnosis not present

## 2018-01-06 DIAGNOSIS — H18212 Corneal edema secondary to contact lens, left eye: Secondary | ICD-10-CM | POA: Diagnosis not present

## 2018-01-06 LAB — CBC WITH DIFFERENTIAL/PLATELET
BASOS ABS: 0.1 10*3/uL (ref 0.0–0.2)
Basos: 1 %
EOS (ABSOLUTE): 0.2 10*3/uL (ref 0.0–0.4)
Eos: 3 %
Hematocrit: 39.5 % (ref 34.0–46.6)
Hemoglobin: 13.3 g/dL (ref 11.1–15.9)
Immature Grans (Abs): 0 10*3/uL (ref 0.0–0.1)
Immature Granulocytes: 0 %
LYMPHS ABS: 1.5 10*3/uL (ref 0.7–3.1)
Lymphs: 21 %
MCH: 31.6 pg (ref 26.6–33.0)
MCHC: 33.7 g/dL (ref 31.5–35.7)
MCV: 94 fL (ref 79–97)
MONOS ABS: 0.6 10*3/uL (ref 0.1–0.9)
Monocytes: 8 %
NEUTROS PCT: 67 %
Neutrophils Absolute: 5 10*3/uL (ref 1.4–7.0)
Platelets: 255 10*3/uL (ref 150–379)
RBC: 4.21 x10E6/uL (ref 3.77–5.28)
RDW: 14.6 % (ref 12.3–15.4)
WBC: 7.3 10*3/uL (ref 3.4–10.8)

## 2018-01-06 LAB — LIPID PANEL
Chol/HDL Ratio: 3.7 ratio (ref 0.0–4.4)
Cholesterol, Total: 185 mg/dL (ref 100–199)
HDL: 50 mg/dL (ref >39)
LDL Calculated: 111 mg/dL — ABNORMAL HIGH (ref 0–99)
Triglycerides: 119 mg/dL (ref 0–149)
VLDL CHOLESTEROL CAL: 24 mg/dL (ref 5–40)

## 2018-01-06 LAB — COMPREHENSIVE METABOLIC PANEL
A/G RATIO: 1.4 (ref 1.2–2.2)
ALBUMIN: 4.2 g/dL (ref 3.5–5.5)
ALT: 14 IU/L (ref 0–32)
AST: 21 IU/L (ref 0–40)
Alkaline Phosphatase: 135 IU/L — ABNORMAL HIGH (ref 39–117)
BUN / CREAT RATIO: 11 (ref 9–23)
BUN: 9 mg/dL (ref 6–24)
Bilirubin Total: 0.4 mg/dL (ref 0.0–1.2)
CALCIUM: 9.2 mg/dL (ref 8.7–10.2)
CO2: 27 mmol/L (ref 20–29)
Chloride: 102 mmol/L (ref 96–106)
Creatinine, Ser: 0.82 mg/dL (ref 0.57–1.00)
GFR calc Af Amer: 94 mL/min/{1.73_m2} (ref >59)
GFR, EST NON AFRICAN AMERICAN: 81 mL/min/{1.73_m2} (ref >59)
Globulin, Total: 3 g/dL (ref 1.5–4.5)
Glucose: 90 mg/dL (ref 65–99)
POTASSIUM: 4.6 mmol/L (ref 3.5–5.2)
Sodium: 143 mmol/L (ref 134–144)
Total Protein: 7.2 g/dL (ref 6.0–8.5)

## 2018-01-06 LAB — TSH: TSH: 4.25 u[IU]/mL (ref 0.450–4.500)

## 2018-01-06 LAB — PROTIME-INR
INR: 2.5 — ABNORMAL HIGH (ref 0.8–1.2)
PROTHROMBIN TIME: 25.1 s — AB (ref 9.1–12.0)

## 2018-01-06 NOTE — Telephone Encounter (Signed)
Pt advised. States she has not been taking the atorvastatin regularly, as she forgot to fill this. Should she continue same dose, or increase? Pt needs refill either way.

## 2018-01-06 NOTE — Telephone Encounter (Signed)
-----   Message from Erasmo DownerAngela M Bacigalupo, MD sent at 01/06/2018  9:20 AM EST ----- Normal Thyroid function, kidney function, liver function, electrolytes, Blood counts. INR is in a good range. You can follow-up with cardiology on this. Cholesterol is high.  Goal LDL is <70 and your's is 111.  Would increase Atorvastatin to 20mg  daily, if patient is willing.  Erasmo DownerBacigalupo, Angela M, MD, MPH [facility] 01/06/2018 9:20 AM

## 2018-01-07 MED ORDER — ATORVASTATIN CALCIUM 10 MG PO TABS: 10.0000 mg | ORAL_TABLET | Freq: Every day | ORAL | 11 refills | Status: DC

## 2018-01-07 NOTE — Telephone Encounter (Signed)
Pt advise

## 2018-01-07 NOTE — Telephone Encounter (Signed)
OK to stay on same dose.  Important to take this regularly, especially with history of heart surgeries.  Refill sent.  Will recheck in 3-6 months to see if it is working well enough  Beryle FlockBacigalupo, Marzella SchleinAngela M, MD, MPH [facility] 01/07/2018 8:46 AM

## 2018-01-07 NOTE — Telephone Encounter (Signed)
[email or web address]

## 2018-01-09 DIAGNOSIS — F320 Major depressive disorder, single episode, mild: Secondary | ICD-10-CM | POA: Diagnosis not present

## 2018-01-13 ENCOUNTER — Ambulatory Visit: Payer: BLUE CROSS/BLUE SHIELD | Admitting: Urology

## 2018-01-14 ENCOUNTER — Encounter: Payer: Self-pay | Admitting: Family Medicine

## 2018-01-15 DIAGNOSIS — J439 Emphysema, unspecified: Secondary | ICD-10-CM | POA: Diagnosis not present

## 2018-01-15 DIAGNOSIS — G4733 Obstructive sleep apnea (adult) (pediatric): Secondary | ICD-10-CM | POA: Diagnosis not present

## 2018-01-15 DIAGNOSIS — Z713 Dietary counseling and surveillance: Secondary | ICD-10-CM | POA: Diagnosis not present

## 2018-01-22 ENCOUNTER — Encounter: Payer: Self-pay | Admitting: Urology

## 2018-01-22 ENCOUNTER — Ambulatory Visit: Payer: BLUE CROSS/BLUE SHIELD | Admitting: Urology

## 2018-01-22 VITALS — BP 99/63 | HR 91 | Ht 67.0 in | Wt 200.0 lb

## 2018-01-22 DIAGNOSIS — N3946 Mixed incontinence: Secondary | ICD-10-CM

## 2018-01-22 LAB — URINALYSIS, COMPLETE
BILIRUBIN UA: NEGATIVE
Glucose, UA: NEGATIVE
Ketones, UA: NEGATIVE
Leukocytes, UA: NEGATIVE
Nitrite, UA: NEGATIVE
PROTEIN UA: NEGATIVE
Specific Gravity, UA: 1.01 (ref 1.005–1.030)
UUROB: 0.2 mg/dL (ref 0.2–1.0)
pH, UA: 7 (ref 5.0–7.5)

## 2018-01-22 LAB — MICROSCOPIC EXAMINATION
Bacteria, UA: NONE SEEN
RBC, UA: NONE SEEN /hpf (ref 0–?)

## 2018-01-22 MED ORDER — SOLIFENACIN SUCCINATE 10 MG PO TABS
10.0000 mg | ORAL_TABLET | Freq: Every day | ORAL | 0 refills | Status: DC
Start: 2018-01-22 — End: 2018-03-26

## 2018-01-22 NOTE — Progress Notes (Signed)
01/22/2018 3:56 PM   Erenest RasherLori A Wedemeyer 08/09/1961 [id number]  Referring provider: Erasmo DownerBacigalupo, Angela M, MD [address] [place], [place] [id number]  Chief complaint: Urinary incontinence  HPI: Nancy JanskyLori [name] is a 57 year old female seen in consultation at the request of Dr. Beryle FlockBacigalupo for evaluation of lower urinary tract symptoms.  She has previously seen Dr. 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  She was also seen by Dr. Apolinar JunesBrandon in 2017 for microhematuria however had no significant hematuria on microscopy.  She is seen today for a 04-3268-month history of sensation of incomplete emptying, urgency with occasional episodes of urge incontinence.  She has no significant stress incontinence.  She denies recurrent UTI.  She denies dysuria, gross hematuria or pelvic pain.  There is no previous neurologic history including MS or Parkinson's.  She has no bowel symptoms.   PMH: Past Medical History:  Diagnosis Date  . Allergy   . Depression   . Hypertension   . Sleep apnea   . Thyroid disease     Surgical History: Past Surgical History:  Procedure Laterality Date  . ABDOMINAL HYSTERECTOMY  2007  . AORTIC VALVE REPLACEMENT  06/2009  . APPENDECTOMY  1985  . CARPAL TUNNEL RELEASE Left 04/12/2013  . CATARACT EXTRACTION Right 1994  . FOOT SURGERY Left 1997-1998    Home Medications:  Allergies as of 01/22/2018      Reactions   Codeine Nausea Only   Can take cough syrup.      Medication List        Accurate as of 01/22/18  3:56 PM. Always use your most recent med list.          acetaminophen-codeine 300-30 MG tablet Commonly known as:  TYLENOL #3 Take 1 tablet by mouth every 6 (six) hours as needed for moderate pain.   amLODipine 5 MG tablet Commonly known as:  NORVASC Take 1 tablet by mouth daily.   atorvastatin 10 MG tablet Commonly known as:  LIPITOR Take 1 tablet (10 mg total) by mouth daily at 6 PM.     carvedilol 25 MG tablet Commonly known as:  COREG Take 1 tablet by mouth 2 (two) times daily.   COMBIVENT 18-103 MCG/ACT inhaler Generic drug:  albuterol-ipratropium Inhale 2 puffs into the lungs every 6 (six) hours as needed.   furosemide 40 MG tablet Commonly known as:  LASIX Take 1 tablet by mouth daily.   levothyroxine 125 MCG tablet Commonly known as:  SYNTHROID, LEVOTHROID TAKE 1 TABLET BY MOUTH DAILY   montelukast 10 MG tablet Commonly known as:  SINGULAIR Take 1 tablet (10 mg total) by mouth daily.   phentermine 37.5 MG capsule Take 37.5 mg by mouth every morning.   SYMBICORT 160-4.5 MCG/ACT inhaler Generic drug:  budesonide-formoterol Inhale 2 puffs into the lungs daily.   topiramate 25 MG tablet Commonly known as:  TOPAMAX Take by mouth.   TYLENOL 500 MG tablet Generic drug:  acetaminophen Take 2 tablets by mouth every 6 (six) hours as needed.   valACYclovir 1000 MG tablet Commonly known as:  VALTREX Take 2 tablets (2,000 mg total) by mouth 2 (two) times daily. One day as needed   warfarin 2.5 MG tablet Commonly known as:  COUMADIN Take as directed by the anticoagulation clinic. If you are unsure how to take this medication, talk to your nurse or doctor. Original instructions:  Take 2 tablets by mouth as directed.   WELLBUTRIN XL 300  MG 24 hr tablet Generic drug:  buPROPion Take 1 tablet by mouth daily.       Allergies:  Allergies  Allergen Reactions  . Codeine Nausea Only    Can take cough syrup.    Family History: Family History  Problem Relation Age of Onset  . Pneumonia Mother   . Breast cancer Mother 6       twice 50's and 81  . Hypertension Father   . Heart disease Father   . Colon cancer Maternal Grandmother   . Heart disease Paternal Grandmother   . Healthy Sister   . Prostate cancer Neg Hx   . Kidney cancer Neg Hx     Social History:  reports that she quit smoking about 22 months ago. she has never used smokeless tobacco.  She reports that she drinks alcohol. She reports that she does not use drugs.  ROS: UROLOGY Frequent Urination?: No Hard to postpone urination?: Yes Burning/pain with urination?: No Get up at night to urinate?: Yes Leakage of urine?: Yes Urine stream starts and stops?: No Trouble starting stream?: No Do you have to strain to urinate?: No Blood in urine?: No Urinary tract infection?: No Sexually transmitted disease?: No Injury to kidneys or bladder?: No Painful intercourse?: No Weak stream?: No Currently pregnant?: No Vaginal bleeding?: No Last menstrual period?: n  Gastrointestinal Nausea?: Yes Vomiting?: No Indigestion/heartburn?: No Diarrhea?: No Constipation?: No  Constitutional Fever: No Night sweats?: No Weight loss?: No Fatigue?: No  Skin Skin rash/lesions?: No Itching?: No  Eyes Blurred vision?: Yes Double vision?: No  Ears/Nose/Throat Sore throat?: No Sinus problems?: No  Hematologic/Lymphatic Swollen glands?: No Easy bruising?: No  Cardiovascular Leg swelling?: No Chest pain?: No  Respiratory Cough?: No Shortness of breath?: Yes  Endocrine Excessive thirst?: No  Musculoskeletal Back pain?: No Joint pain?: No  Neurological Headaches?: No Dizziness?: No  Psychologic Depression?: Yes Anxiety?: No  Physical Exam: BP 99/63   Pulse 91   Ht 5\' 7"  (1.702 m)   Wt 200 lb (90.7 kg)   BMI 31.32 kg/m   Constitutional:  Alert and oriented, No acute distress. HEENT: [place] AT, moist mucus membranes.  Trachea midline, no masses. Cardiovascular: No clubbing, cyanosis, or edema. Respiratory: Normal respiratory effort, no increased work of breathing. GI: Abdomen is soft, nontender, nondistended, no abdominal masses GU: No CVA tenderness Lymph: No cervical or inguinal lymphadenopathy. Skin: No rashes, bruises or suspicious lesions. Neurologic: Grossly intact, no focal deficits, moving all 4 extremities. Psychiatric: Normal mood and  affect.  Laboratory Data: Lab Results  Component Value Date   WBC 7.3 01/05/2018   HGB 13.3 01/05/2018   HCT 39.5 01/05/2018   MCV 94 01/05/2018   PLT 255 01/05/2018    Lab Results  Component Value Date   CREATININE 0.82 01/05/2018    Urinalysis Dipstick: Trace blood Microscopy: Negative  Pertinent Imaging: N/A  Assessment & Plan:   57 year old female with lower urinary tract symptoms including sensation of incomplete emptying, urgency with occasional episodes of urge incontinence.  She has minimal stress incontinence.  We discussed management options including medical management, behavioral therapy and pelvic floor physical therapy.  She has elected an initial trial of medical therapy and Rx Vesicare was sent to her pharmacy.  Follow-up 1 month for symptom reassessment and bladder scan for PVR.    Riki Altes, MD  [facility] [address], [address] [place], [place] [address] [phone]

## 2018-01-26 ENCOUNTER — Encounter: Payer: Self-pay | Admitting: Urology

## 2018-01-27 DIAGNOSIS — H524 Presbyopia: Secondary | ICD-10-CM | POA: Diagnosis not present

## 2018-01-29 ENCOUNTER — Encounter: Payer: Self-pay | Admitting: Family Medicine

## 2018-01-29 DIAGNOSIS — Z90710 Acquired absence of both cervix and uterus: Secondary | ICD-10-CM | POA: Insufficient documentation

## 2018-01-30 DIAGNOSIS — G4733 Obstructive sleep apnea (adult) (pediatric): Secondary | ICD-10-CM | POA: Diagnosis not present

## 2018-01-30 DIAGNOSIS — J439 Emphysema, unspecified: Secondary | ICD-10-CM | POA: Diagnosis not present

## 2018-02-01 DIAGNOSIS — J449 Chronic obstructive pulmonary disease, unspecified: Secondary | ICD-10-CM | POA: Diagnosis not present

## 2018-02-13 ENCOUNTER — Encounter: Payer: Self-pay | Admitting: Family Medicine

## 2018-02-18 DIAGNOSIS — J439 Emphysema, unspecified: Secondary | ICD-10-CM | POA: Diagnosis not present

## 2018-02-18 DIAGNOSIS — G4733 Obstructive sleep apnea (adult) (pediatric): Secondary | ICD-10-CM | POA: Diagnosis not present

## 2018-02-18 DIAGNOSIS — R05 Cough: Secondary | ICD-10-CM | POA: Diagnosis not present

## 2018-02-24 ENCOUNTER — Telehealth: Payer: Self-pay | Admitting: Urology

## 2018-02-24 NOTE — Telephone Encounter (Signed)
Patient called to cancel her appointment with Dr. Lonna CobbStoioff for 02-25-18 because she said she never got the medication that he wanted her to start taking. Can you see what that was and why she never got it?   Nancy DusterMichelle

## 2018-02-24 NOTE — Telephone Encounter (Signed)
Vesicare was sent to [facility] in [place].

## 2018-02-25 ENCOUNTER — Ambulatory Visit: Payer: BLUE CROSS/BLUE SHIELD | Admitting: Urology

## 2018-03-02 DIAGNOSIS — J439 Emphysema, unspecified: Secondary | ICD-10-CM | POA: Diagnosis not present

## 2018-03-02 DIAGNOSIS — G4733 Obstructive sleep apnea (adult) (pediatric): Secondary | ICD-10-CM | POA: Diagnosis not present

## 2018-03-04 DIAGNOSIS — J449 Chronic obstructive pulmonary disease, unspecified: Secondary | ICD-10-CM | POA: Diagnosis not present

## 2018-03-13 DIAGNOSIS — F320 Major depressive disorder, single episode, mild: Secondary | ICD-10-CM | POA: Diagnosis not present

## 2018-03-16 ENCOUNTER — Ambulatory Visit
Admission: RE | Admit: 2018-03-16 | Discharge: 2018-03-16 | Disposition: A | Discharge status: Home / Self Care | Payer: BLUE CROSS/BLUE SHIELD | Source: Ambulatory Visit | Attending: Family Medicine | Admitting: Family Medicine

## 2018-03-16 ENCOUNTER — Encounter: Payer: Self-pay | Admitting: Family Medicine

## 2018-03-16 ENCOUNTER — Ambulatory Visit: Payer: BLUE CROSS/BLUE SHIELD | Admitting: Family Medicine

## 2018-03-16 VITALS — BP 138/70 | HR 96 | Temp 98.5°F | Resp 20 | Wt 205.0 lb

## 2018-03-16 DIAGNOSIS — R918 Other nonspecific abnormal finding of lung field: Secondary | ICD-10-CM | POA: Diagnosis not present

## 2018-03-16 DIAGNOSIS — J441 Chronic obstructive pulmonary disease with (acute) exacerbation: Secondary | ICD-10-CM | POA: Diagnosis not present

## 2018-03-16 DIAGNOSIS — T148XXA Other injury of unspecified body region, initial encounter: Secondary | ICD-10-CM | POA: Diagnosis not present

## 2018-03-16 DIAGNOSIS — Z952 Presence of prosthetic heart valve: Secondary | ICD-10-CM | POA: Insufficient documentation

## 2018-03-16 DIAGNOSIS — J449 Chronic obstructive pulmonary disease, unspecified: Secondary | ICD-10-CM | POA: Diagnosis not present

## 2018-03-16 DIAGNOSIS — I712 Thoracic aortic aneurysm, without rupture: Secondary | ICD-10-CM | POA: Insufficient documentation

## 2018-03-16 DIAGNOSIS — R05 Cough: Secondary | ICD-10-CM | POA: Diagnosis not present

## 2018-03-16 DIAGNOSIS — R079 Chest pain, unspecified: Secondary | ICD-10-CM | POA: Diagnosis not present

## 2018-03-16 MED ORDER — PREDNISONE 20 MG PO TABS: 40.0000 mg | ORAL_TABLET | Freq: Every day | ORAL | 0 refills | Status: AC

## 2018-03-16 MED ORDER — HYDROCOD POLST-CPM POLST ER 10-8 MG/5ML PO SUER: 5.0000 mL | Freq: Two times a day (BID) | ORAL | 0 refills | Status: DC | PRN

## 2018-03-16 MED ORDER — VALACYCLOVIR HCL 1 G PO TABS: 2000.0000 mg | ORAL_TABLET | Freq: Two times a day (BID) | ORAL | 1 refills | Status: AC

## 2018-03-16 NOTE — Progress Notes (Signed)
Patient: Nancy [name] Female    DOB: 1961/01/18   57 y.o.   MRN: [id number] Visit Date: 03/16/2018  Today's Provider: Shirlee Latch, MD   Chief Complaint  Patient presents with  . pain under rib   Subjective:    HPI  Patient reports that she has had a cough for a little over 1 month and she thinks that she may have pulled a muscle due to coughing so much. She reports that she has severe pain on the right side of her ribcage that is very tender.   She reports that she has tried rib biopsy, Tylenol, heating pad, Vicodin, and Ibuprofen with out any relief'.  She reports coughing up greenish sputum and shortness of breath.    Allergies  Allergen Reactions  . Codeine Nausea Only    Can take cough syrup.     Current Outpatient Medications:  .  acetaminophen (TYLENOL) 500 MG tablet, Take 2 tablets by mouth every 6 (six) hours as needed., Disp: , Rfl:  .  acetaminophen-codeine (TYLENOL #3) 300-30 MG tablet, Take 1 tablet by mouth every 6 (six) hours as needed for moderate pain., Disp: 30 tablet, Rfl: 0 .  albuterol-ipratropium (COMBIVENT) 18-103 MCG/ACT inhaler, Inhale 2 puffs into the lungs every 6 (six) hours as needed. , Disp: , Rfl:  .  amLODipine (NORVASC) 5 MG tablet, Take 1 tablet by mouth daily., Disp: , Rfl:  .  atorvastatin (LIPITOR) 10 MG tablet, Take 1 tablet (10 mg total) by mouth daily at 6 PM., Disp: 30 tablet, Rfl: 11 .  budesonide-formoterol (SYMBICORT) 160-4.5 MCG/ACT inhaler, Inhale 2 puffs into the lungs daily. , Disp: , Rfl:  .  buPROPion (WELLBUTRIN XL) 300 MG 24 hr tablet, Take 1 tablet by mouth daily., Disp: , Rfl:  .  carvedilol (COREG) 25 MG tablet, Take 1 tablet by mouth 2 (two) times daily., Disp: , Rfl:  .  levothyroxine (SYNTHROID, LEVOTHROID) 125 MCG tablet, TAKE 1 TABLET BY MOUTH DAILY, Disp: 90 tablet, Rfl: 1 .  montelukast (SINGULAIR) 10 MG tablet, Take 1 tablet (10 mg total) by mouth daily., Disp: 90 tablet, Rfl: 1 .  phentermine 37.5 MG  capsule, Take 37.5 mg by mouth every morning., Disp: , Rfl:  .  solifenacin (VESICARE) 10 MG tablet, Take 1 tablet (10 mg total) by mouth daily., Disp: 30 tablet, Rfl: 0 .  topiramate (TOPAMAX) 25 MG tablet, Take by mouth., Disp: , Rfl:  .  valACYclovir (VALTREX) 1000 MG tablet, Take 2 tablets (2,000 mg total) by mouth 2 (two) times daily. One day as needed, Disp: 16 tablet, Rfl: 1 .  warfarin (COUMADIN) 2.5 MG tablet, Take 2 tablets by mouth as directed., Disp: , Rfl:  .  furosemide (LASIX) 40 MG tablet, Take 1 tablet by mouth daily., Disp: , Rfl:   Review of Systems  Constitutional: Positive for activity change and fatigue.  Respiratory: Positive for cough, shortness of breath and wheezing.   Cardiovascular: Positive for chest pain. Negative for palpitations and leg swelling.  Musculoskeletal: Positive for arthralgias, back pain and myalgias.  Skin: Negative.   Allergic/Immunologic: Negative for environmental allergies.  Neurological: Negative for dizziness, numbness and headaches.    Social History   Tobacco Use  . Smoking status: Former Smoker    Last attempt to quit: 03/11/2016    Years since quitting: 2.0  . Smokeless tobacco: Never Used  . Tobacco comment: patient states  that she is working at her own pace  to quit smoking  Substance Use Topics  . Alcohol use: Yes    Alcohol/week: 0.0 oz    Comment: OCCASIONALLY   Objective:   BP 138/70 (BP Location: Left Arm, Patient Position: Sitting, Cuff Size: Large)   Pulse 96   Temp 98.5 F (36.9 C)   Resp 20   Wt 205 lb (93 kg)   SpO2 93%   BMI 32.11 kg/m  Vitals:   03/16/18 1459  BP: 138/70  Pulse: 96  Resp: 20  Temp: 98.5 F (36.9 C)  SpO2: 93%  Weight: 205 lb (93 kg)     Physical Exam  Constitutional: She is oriented to person, place, and time. She appears well-developed and well-nourished. No distress.  HENT:  Head: Normocephalic and atraumatic.  Right Ear: External ear normal.  Left Ear: External ear normal.    Nose: Nose normal.  Mouth/Throat: Oropharynx is clear and moist. No oropharyngeal exudate.  Eyes: Conjunctivae are normal. Right eye exhibits no discharge. Left eye exhibits no discharge. No scleral icterus.  Neck: Neck supple. No thyromegaly present.  Cardiovascular: Normal rate, regular rhythm and intact distal pulses.  Pulmonary/Chest: Effort normal. No respiratory distress.  Intermittent coughing.  Decreased breath sounds in right base.  Tenderness to palpation along right flank  Musculoskeletal: She exhibits no edema.  Lymphadenopathy:    She has no cervical adenopathy.  Neurological: She is alert and oriented to person, place, and time.  Skin: Skin is warm and dry. Capillary refill takes less than 2 seconds. No rash noted.  Psychiatric: She has a normal mood and affect. Her behavior is normal.  Vitals reviewed.     Assessment & Plan:     1. Chronic obstructive pulmonary disease with acute exacerbation (HCC) -Duration of cough as well as decreased breath sounds in right lower lobe consistent with likely COPD exacerbation -Get chest x-ray to rule out infectious process -Treat with 5-day burst of prednisone -Could consider antibiotic therapy, but patient is on chronic Coumadin, so would avoid if possible -Continue inhalers as prescribed for COPD - Hydrocodone cough syrup given to use sparingly - DG Chest 2 View; Future  2. Muscle strain -Suspect that her flank pain and tenderness is related to muscle strain from overuse due to coughing -Encouraged deep breathing despite muscle pain -Encouraged incentive spirometry - Conservative therapy -Treat cough as above which will help with muscle strain he will - DG Chest 2 View; Future    Meds ordered this encounter  Medications  . predniSONE (DELTASONE) 20 MG tablet    Sig: Take 2 tablets (40 mg total) by mouth daily with breakfast for 5 days.    Dispense:  10 tablet    Refill:  0  . chlorpheniramine-HYDROcodone (TUSSIONEX  PENNKINETIC ER) 10-8 MG/5ML SUER    Sig: Take 5 mLs by mouth every 12 (twelve) hours as needed for cough.    Dispense:  115 mL    Refill:  0  . valACYclovir (VALTREX) 1000 MG tablet    Sig: Take 2 tablets (2,000 mg total) by mouth 2 (two) times daily for 1 day. One day as needed    Dispense:  4 tablet    Refill:  1     Return if symptoms worsen or fail to improve.   The entirety of the information documented in the History of Present Illness, Review of Systems and Physical Exam were personally obtained by me. Portions of this information were initially documented by Anson [place], CMA and reviewed by me for  thoroughness and accuracy.    Erasmo Downer, MD, MPH [facility] 03/16/2018 4:43 PM

## 2018-03-16 NOTE — Patient Instructions (Signed)

## 2018-03-17 ENCOUNTER — Telehealth: Payer: Self-pay

## 2018-03-17 NOTE — Telephone Encounter (Signed)
Pt advised.

## 2018-03-17 NOTE — Telephone Encounter (Signed)
-----   Message from Erasmo Downer, MD sent at 03/17/2018  8:55 AM EDT ----- Lungs with stable COPD findings.  No pneumonia  Erasmo Downer, MD, MPH [facility] 03/17/2018 8:55 AM

## 2018-03-23 ENCOUNTER — Ambulatory Visit: Payer: BLUE CROSS/BLUE SHIELD | Admitting: Physician Assistant

## 2018-03-23 ENCOUNTER — Encounter: Payer: Self-pay | Admitting: Physician Assistant

## 2018-03-23 VITALS — BP 136/72 | HR 88 | Temp 98.3°F | Resp 16

## 2018-03-23 DIAGNOSIS — Z952 Presence of prosthetic heart valve: Secondary | ICD-10-CM | POA: Diagnosis not present

## 2018-03-23 DIAGNOSIS — J441 Chronic obstructive pulmonary disease with (acute) exacerbation: Secondary | ICD-10-CM | POA: Diagnosis not present

## 2018-03-23 DIAGNOSIS — I359 Nonrheumatic aortic valve disorder, unspecified: Secondary | ICD-10-CM

## 2018-03-23 DIAGNOSIS — Z7901 Long term (current) use of anticoagulants: Secondary | ICD-10-CM

## 2018-03-23 LAB — POCT INR
INR: 1.2
PT: 13.9

## 2018-03-23 MED ORDER — IPRATROPIUM-ALBUTEROL 0.5-2.5 (3) MG/3ML IN SOLN
3.0000 mL | Freq: Once | RESPIRATORY_TRACT | Status: AC
Start: 2018-03-23 — End: ?

## 2018-03-23 NOTE — Progress Notes (Signed)
Patient: Nancy [name] Female    DOB: [birth year]   57 y.o.   MRN: [id number] Visit Date: 03/23/2018  Today's Provider: Trey Sailors, PA-C   Chief Complaint  Patient presents with  . COPD   Subjective:    Nancy [name] is a 57 y/o woman with history of aortic dissection, mechanical aortic valvae on chronnic coumadin therapy, COPD who is presenting today for cough. She was seen last week by Dr. B and given a prednisone burst, 40 mg QD x 5 days, had a CXR that did not reveal pneumonia. Abx therapy was deferred due to patient's anticoagulation with warfarin. Patient was also given Tussionex cough syrup for sternal pain due to coughing. Patient says she feels no better. Still coughing, occasionally productive, green colored. She has not used Combivent since Saturday. She has not used nebulizer treatment since Saturday. She wants to know what she can do about her chest pain. So far she has taken left over tramadol and oxycodone. She also took some of her mother's vicodin, none of which worked.  Of note, she has not been to see cardiology since 04/2017. Per review of note by Leanora Ivanoff, PA-C, patient was to return for follow up after echo. Last INR from [facility] was 08/2017. She says it's "been a while" since she last saw them. She says she missed her Coumadin dose yesterday because she was "too comfortable and didn't want to get up"  COPD  She complains of chest tightness, cough, shortness of breath, sputum production and wheezing. This is a chronic problem. The cough is productive. Associated symptoms include dyspnea on exertion and malaise/fatigue. Pertinent negatives include no appetite change, chest pain, ear congestion, ear pain, fever, headaches, heartburn, myalgias, nasal congestion, orthopnea, PND, postnasal drip, rhinorrhea, sneezing, sore throat, sweats, trouble swallowing or weight loss. Her past medical history is significant for COPD.       Allergies  Allergen  Reactions  . Codeine Nausea Only    Can take cough syrup.     Current Outpatient Medications:  .  albuterol-ipratropium (COMBIVENT) 18-103 MCG/ACT inhaler, Inhale 2 puffs into the lungs every 6 (six) hours as needed. , Disp: , Rfl:  .  amLODipine (NORVASC) 5 MG tablet, Take 1 tablet by mouth daily., Disp: , Rfl:  .  atorvastatin (LIPITOR) 10 MG tablet, Take 1 tablet (10 mg total) by mouth daily at 6 PM., Disp: 30 tablet, Rfl: 11 .  budesonide-formoterol (SYMBICORT) 160-4.5 MCG/ACT inhaler, Inhale 2 puffs into the lungs daily. , Disp: , Rfl:  .  buPROPion (WELLBUTRIN XL) 300 MG 24 hr tablet, Take 1 tablet by mouth daily., Disp: , Rfl:  .  carvedilol (COREG) 25 MG tablet, Take 1 tablet by mouth 2 (two) times daily., Disp: , Rfl:  .  chlorpheniramine-HYDROcodone (TUSSIONEX PENNKINETIC ER) 10-8 MG/5ML SUER, Take 5 mLs by mouth every 12 (twelve) hours as needed for cough., Disp: 115 mL, Rfl: 0 .  furosemide (LASIX) 40 MG tablet, Take 1 tablet by mouth daily., Disp: , Rfl:  .  levothyroxine (SYNTHROID, LEVOTHROID) 125 MCG tablet, TAKE 1 TABLET BY MOUTH DAILY, Disp: 90 tablet, Rfl: 1 .  montelukast (SINGULAIR) 10 MG tablet, Take 1 tablet (10 mg total) by mouth daily., Disp: 90 tablet, Rfl: 1 .  phentermine 37.5 MG capsule, Take 37.5 mg by mouth every morning., Disp: , Rfl:  .  solifenacin (VESICARE) 10 MG tablet, Take 1 tablet (10 mg total) by mouth daily., Disp:  30 tablet, Rfl: 0 .  topiramate (TOPAMAX) 25 MG tablet, Take by mouth., Disp: , Rfl:  .  warfarin (COUMADIN) 2.5 MG tablet, Take 2 tablets by mouth as directed., Disp: , Rfl:  .  acetaminophen (TYLENOL) 500 MG tablet, Take 2 tablets by mouth every 6 (six) hours as needed., Disp: , Rfl:   Review of Systems  Constitutional: Positive for fatigue and malaise/fatigue. Negative for activity change, appetite change, chills, diaphoresis, fever, unexpected weight change and weight loss.  HENT: Negative.  Negative for ear pain, postnasal drip,  rhinorrhea, sneezing, sore throat and trouble swallowing.   Eyes: Negative.   Respiratory: Positive for cough, sputum production, shortness of breath and wheezing.   Cardiovascular: Positive for dyspnea on exertion. Negative for chest pain and PND.  Gastrointestinal: Negative.  Negative for heartburn.  Musculoskeletal: Negative for myalgias.  Neurological: Negative for dizziness, light-headedness and headaches.    Social History   Tobacco Use  . Smoking status: Former Smoker    Last attempt to quit: 03/11/2016    Years since quitting: 2.0  . Smokeless tobacco: Never Used  . Tobacco comment: patient states  that she is working at her own pace to quit smoking  Substance Use Topics  . Alcohol use: Yes    Alcohol/week: 0.0 oz    Comment: OCCASIONALLY   Objective:   BP 136/72 (BP Location: Right Arm, Patient Position: Sitting, Cuff Size: Normal)   Pulse 88   Temp 98.3 F (36.8 C) (Oral)   Resp 16   SpO2 94%  Vitals:   03/23/18 1601  BP: 136/72  Pulse: 88  Resp: 16  Temp: 98.3 F (36.8 C)  TempSrc: Oral  SpO2: 94%     Physical Exam  Constitutional: She is oriented to person, place, and time. She appears well-developed and well-nourished.  Cardiovascular: Normal rate and regular rhythm.  Mechanical heart valve present.   Pulmonary/Chest: Effort normal. No respiratory distress. She has no wheezes.  Poor air entry bilaterally that improves with duoneb in office.   Neurological: She is alert and oriented to person, place, and time.  Skin: Skin is warm and dry.  Psychiatric: She has a normal mood and affect. Her behavior is normal.        Assessment & Plan:     1. COPD exacerbation (HCC)  Air entry decreased bilaterally though improved with duoneb. Patient is noncompliant with warfarin therapy and follow up. Today, her INR is 1.2, she admits to missing one dose yesterday. Feel that her INR is subtherapeutic and to start doxycycline now would confound her INR therapy, which  has historically been managed by cardiology. Feel the risk of starting doxycycline now outweighs the benefits. Instructed on regular nebulizer treatments, her last rescue and nebulizer treatment were Saturday. She declines further steroids. I will not give further pain management for MSK strain due to coughing as she has tried multiple narcotics including those not prescribed to her without effect. She still has tussionex and she was instructed that additional narcotics could dangerously slow her breathing.  - ipratropium-albuterol (DUONEB) 0.5-2.5 (3) MG/3ML nebulizer solution 3 mL  2. Aortic valve defect  She is on chronic warfarin for this, noncompliant with follow up. INR today is 1.2. Counseled on bridging with Lovenox, she declines. I have stressed the importance of being compliant with follow up on this medication. I have told her I will be sending a message to her care team to ensure that she follows up with this. Will not start  antibiotics today. She says she wants to "get rid of this cough" and that her INR will be back to normal in three days. Will schedule follow up for Dr. B to discuss abx therapy if INR therapeutic at that point. I have instructed her that she should not use this clinic to manage her INR and needs to follow up with cardiology despite this appointment.  3. Mechanical heart valve present  See above.  4. Warfarin anticoagulation  See above.   Return if symptoms worsen or fail to improve.  The entirety of the information documented in the History of Present Illness, Review of Systems and Physical Exam were personally obtained by me. Portions of this information were initially documented by Kavin Leech, CMA and reviewed by me for thoroughness and accuracy.          Trey Sailors, PA-C  [facility]

## 2018-03-23 NOTE — Patient Instructions (Signed)
Chronic Obstructive Pulmonary Disease Exacerbation  Chronic obstructive pulmonary disease (COPD) is a common lung problem. In COPD, the flow of air from the lungs is limited. COPD exacerbations are times that breathing gets worse and you need extra treatment. Without treatment they can be life threatening. If they happen often, your lungs can become more damaged. If your COPD gets worse, your doctor may treat you with:  ? Medicines.  ? Oxygen.  ? Different ways to clear your airway, such as using a mask.    Follow these instructions at home:  ? Do not smoke.  ? Avoid tobacco smoke and other things that bother your lungs.  ? If given, take your antibiotic medicine as told. Finish the medicine even if you start to feel better.  ? Only take medicines as told by your doctor.  ? Drink enough fluids to keep your pee (urine) clear or pale yellow (unless your doctor has told you not to).  ? Use a cool mist machine (vaporizer).  ? If you use oxygen or a machine that turns liquid medicine into a mist (nebulizer), continue to use them as told.  ? Keep up with shots (vaccinations) as told by your doctor.  ? Exercise regularly.  ? Eat healthy foods.  ? Keep all doctor visits as told.  Get help right away if:  ? You are very short of breath and it gets worse.  ? You have trouble talking.  ? You have bad chest pain.  ? You have blood in your spit (sputum).  ? You have a fever.  ? You keep throwing up (vomiting).  ? You feel weak, or you pass out (faint).  ? You feel confused.  ? You keep getting worse.  This information is not intended to replace advice given to you by your health care provider. Make sure you discuss any questions you have with your health care provider.  Document Released: 10/17/2011 Document Revised: 04/04/2016 Document Reviewed: 07/02/2013  Elsevier Interactive Patient Education ? 2017 Elsevier Inc.

## 2018-03-26 ENCOUNTER — Ambulatory Visit
Admission: RE | Admit: 2018-03-26 | Discharge: 2018-03-26 | Disposition: A | Discharge status: Home / Self Care | Payer: BLUE CROSS/BLUE SHIELD | Source: Ambulatory Visit | Attending: Family Medicine | Admitting: Family Medicine

## 2018-03-26 ENCOUNTER — Ambulatory Visit: Payer: BLUE CROSS/BLUE SHIELD | Admitting: Family Medicine

## 2018-03-26 ENCOUNTER — Encounter: Payer: Self-pay | Admitting: Family Medicine

## 2018-03-26 VITALS — BP 126/64 | HR 90 | Temp 98.1°F | Resp 20 | Wt 204.0 lb

## 2018-03-26 DIAGNOSIS — R0781 Pleurodynia: Secondary | ICD-10-CM | POA: Diagnosis not present

## 2018-03-26 DIAGNOSIS — R05 Cough: Secondary | ICD-10-CM | POA: Diagnosis not present

## 2018-03-26 DIAGNOSIS — M546 Pain in thoracic spine: Secondary | ICD-10-CM | POA: Insufficient documentation

## 2018-03-26 DIAGNOSIS — Z952 Presence of prosthetic heart valve: Secondary | ICD-10-CM

## 2018-03-26 DIAGNOSIS — R059 Cough, unspecified: Secondary | ICD-10-CM

## 2018-03-26 LAB — POCT INR
INR: 2.2
PT: 26.4

## 2018-03-26 MED ORDER — HYDROCODONE-ACETAMINOPHEN 5-325 MG PO TABS: 1.0000 | ORAL_TABLET | Freq: Four times a day (QID) | ORAL | 0 refills | Status: AC | PRN

## 2018-03-26 NOTE — Patient Instructions (Signed)
Cough, Adult  Coughing is a reflex that clears your throat and your airways. Coughing helps to heal and protect your lungs. It is normal to cough occasionally, but a cough that happens with other symptoms or lasts a long time may be a sign of a condition that needs treatment. A cough may last only 2-3 weeks (acute), or it may last longer than 8 weeks (chronic).  What are the causes?  Coughing is commonly caused by:   Breathing in substances that irritate your lungs.   A viral or bacterial respiratory infection.   Allergies.   Asthma.   Postnasal drip.   Smoking.   Acid backing up from the stomach into the esophagus (gastroesophageal reflux).   Certain medicines.   Chronic lung problems, including COPD (or rarely, lung cancer).   Other medical conditions such as heart failure.    Follow these instructions at home:  Pay attention to any changes in your symptoms. Take these actions to help with your discomfort:   Take medicines only as told by your health care provider.  ? If you were prescribed an antibiotic medicine, take it as told by your health care provider. Do not stop taking the antibiotic even if you start to feel better.  ? Talk with your health care provider before you take a cough suppressant medicine.   Drink enough fluid to keep your urine clear or pale yellow.   If the air is dry, use a cold steam vaporizer or humidifier in your bedroom or your home to help loosen secretions.   Avoid anything that causes you to cough at work or at home.   If your cough is worse at night, try sleeping in a semi-upright position.   Avoid cigarette smoke. If you smoke, quit smoking. If you need help quitting, ask your health care provider.   Avoid caffeine.   Avoid alcohol.   Rest as needed.    Contact a health care provider if:   You have new symptoms.   You cough up pus.   Your cough does not get better after 2-3 weeks, or your cough gets worse.   You cannot control your cough with suppressant  medicines and you are losing sleep.   You develop pain that is getting worse or pain that is not controlled with pain medicines.   You have a fever.   You have unexplained weight loss.   You have night sweats.  Get help right away if:   You cough up blood.   You have difficulty breathing.   Your heartbeat is very fast.  This information is not intended to replace advice given to you by your health care provider. Make sure you discuss any questions you have with your health care provider.  Document Released: 04/26/2011 Document Revised: 04/04/2016 Document Reviewed: 01/04/2015  [employer] Interactive Patient Education  2018 [employer]

## 2018-03-26 NOTE — Progress Notes (Signed)
Patient: Nancy [name] Female    DOB: [birth year]   57 y.o.   MRN: [id number] Visit Date: 03/27/2018  Today's Provider: Shirlee Latch, MD   I, Joslyn Hy, CMA, am acting as scribe for Shirlee Latch, MD.  Chief Complaint  Patient presents with  . COPD   Subjective:    HPI     Follow up for COPD  The patient was last seen for this 3 days ago. Changes made at last visit include a Duoneb tx in office, and advising her to use neb tx at home. Pt's INR was subtherapeutic at LOV at 1.2, and pt was advised to take warfarin as prescribed, as she had recently missed a dose. Pt was advised to FU woth PCP for recheck INR, and consider abx at that time if INR is in therapeutic range.  She reports good compliance with treatment. She is taking Coumadin 5 mg po qd, and is using nebulizer treatments at home.   She has an appointment with cardiology next week to follow-up on her INR. She feels that condition is Worse. She is not having side effects.   Pt is requesting a muscle relaxer to improve pain from coughing. She states she was driving home when she heard a "pop" wile coughing. The pain is severe.  ------------------------------------------------------------------------------------    Allergies  Allergen Reactions  . Codeine Nausea Only    Can take cough syrup.     Current Outpatient Medications:  .  acetaminophen (TYLENOL) 500 MG tablet, Take 2 tablets by mouth every 6 (six) hours as needed., Disp: , Rfl:  .  albuterol-ipratropium (COMBIVENT) 18-103 MCG/ACT inhaler, Inhale 2 puffs into the lungs every 6 (six) hours as needed. , Disp: , Rfl:  .  amLODipine (NORVASC) 5 MG tablet, Take 1 tablet by mouth daily., Disp: , Rfl:  .  atorvastatin (LIPITOR) 10 MG tablet, Take 1 tablet (10 mg total) by mouth daily at 6 PM., Disp: 30 tablet, Rfl: 11 .  budesonide-formoterol (SYMBICORT) 160-4.5 MCG/ACT inhaler, Inhale 2 puffs into the lungs daily. , Disp: , Rfl:  .  buPROPion  (WELLBUTRIN XL) 300 MG 24 hr tablet, Take 1 tablet by mouth daily., Disp: , Rfl:  .  carvedilol (COREG) 25 MG tablet, Take 1 tablet by mouth 2 (two) times daily., Disp: , Rfl:  .  chlorpheniramine-HYDROcodone (TUSSIONEX PENNKINETIC ER) 10-8 MG/5ML SUER, Take 5 mLs by mouth every 12 (twelve) hours as needed for cough., Disp: 115 mL, Rfl: 0 .  furosemide (LASIX) 40 MG tablet, Take 1 tablet by mouth daily., Disp: , Rfl:  .  levothyroxine (SYNTHROID, LEVOTHROID) 125 MCG tablet, TAKE 1 TABLET BY MOUTH DAILY, Disp: 90 tablet, Rfl: 1 .  montelukast (SINGULAIR) 10 MG tablet, Take 1 tablet (10 mg total) by mouth daily., Disp: 90 tablet, Rfl: 1 .  phentermine 37.5 MG capsule, Take 37.5 mg by mouth every morning., Disp: , Rfl:  .  topiramate (TOPAMAX) 25 MG tablet, Take by mouth., Disp: , Rfl:  .  warfarin (COUMADIN) 2.5 MG tablet, Take 2 tablets by mouth as directed., Disp: , Rfl:  .  HYDROcodone-acetaminophen (NORCO/VICODIN) 5-325 MG tablet, Take 1 tablet by mouth every 6 (six) hours as needed for up to 5 days for moderate pain., Disp: 20 tablet, Rfl: 0  Current Facility-Administered Medications:  .  ipratropium-albuterol (DUONEB) 0.5-2.5 (3) MG/3ML nebulizer solution 3 mL, 3 mL, Nebulization, Once, Pollak, Adriana M, PA-C  Review of Systems  Constitutional: Positive for activity  change and appetite change. Negative for chills, diaphoresis, fatigue, fever and unexpected weight change.  Respiratory: Positive for cough and shortness of breath.   Cardiovascular: Positive for chest pain. Negative for palpitations and leg swelling.    Social History   Tobacco Use  . Smoking status: Former Smoker    Last attempt to quit: 03/11/2016    Years since quitting: 2.0  . Smokeless tobacco: Never Used  . Tobacco comment: patient states  that she is working at her own pace to quit smoking  Substance Use Topics  . Alcohol use: Yes    Alcohol/week: 0.0 oz    Comment: OCCASIONALLY   Objective:   BP 126/64 (BP  Location: Left Arm, Patient Position: Sitting, Cuff Size: Normal)   Pulse 90   Temp 98.1 F (36.7 C) (Oral)   Resp 20   Wt 204 lb (92.5 kg)   SpO2 96%   BMI 31.95 kg/m  Vitals:   03/26/18 1605  BP: 126/64  Pulse: 90  Resp: 20  Temp: 98.1 F (36.7 C)  TempSrc: Oral  SpO2: 96%  Weight: 204 lb (92.5 kg)     Physical Exam  Constitutional: She is oriented to person, place, and time. She appears well-developed and well-nourished. No distress.  HENT:  Head: Normocephalic and atraumatic.  Mouth/Throat: Oropharynx is clear and moist.  Eyes: Pupils are equal, round, and reactive to light. Conjunctivae are normal. No scleral icterus.  Cardiovascular: Normal rate and regular rhythm.  Murmur heard. Pulmonary/Chest: Effort normal and breath sounds normal. No respiratory distress. She has no wheezes. She has no rales.  Intermittent coughing that causes patient to be tearful  Abdominal: Soft. She exhibits no distension. There is no tenderness.  Musculoskeletal: She exhibits no edema.  Back: Tenderness to palpation diffusely over bilateral thoracic spine and rib areas of back and flanks.  No midline tenderness palpation over spinous processes.  Neurological: She is alert and oriented to person, place, and time.  Skin: Skin is warm and dry. Capillary refill takes less than 2 seconds. No rash noted.  Psychiatric: Her speech is normal. Her affect is labile.  Intermittently tearful  Vitals reviewed.    Results for orders placed or performed in visit on 03/26/18  POCT INR  Result Value Ref Range   INR 2.2    PT 26.4        Assessment & Plan:     1. Cough -Patient with recent negative CXR with no pneumonia -Cough has persisted despite treatment with steroid burst - Lungs are clear today without any sign of COPD exacerbation -Do not think that antibiotic would be helpful at this time - DG Ribs Unilateral Left; Future - DG Ribs Unilateral Right; Future - DG Thoracic Spine  W/Swimmers; Future  2. Acute bilateral thoracic back pain -Suspect muscular pain from continued coughing, but could also have some costochondritis -Given patient's chronic use of intermittent steroids, she is at higher risk for osteoporosis and therefore for compression fractures -We will get x-rays to ensure no fractures - 5-day supply of Vicodin given, but advised that no further narcotics will be given - Advised not to take Tussionex with Vicodin - DG Ribs Unilateral Left; Future - DG Ribs Unilateral Right; Future - DG Thoracic Spine W/Swimmers; Future  3. Mechanical heart valve present -INR improving -Continue Coumadin at current dose - Maintain follow-up with cardiology to resume regular checking of INR - POCT INR    Meds ordered this encounter  Medications  . HYDROcodone-acetaminophen (NORCO/VICODIN)  5-325 MG tablet    Sig: Take 1 tablet by mouth every 6 (six) hours as needed for up to 5 days for moderate pain.    Dispense:  20 tablet    Refill:  0     Return if symptoms worsen or fail to improve.   The entirety of the information documented in the History of Present Illness, Review of Systems and Physical Exam were personally obtained by me. Portions of this information were initially documented by Irving Burton Ratchford, CMA and reviewed by me for thoroughness and accuracy.    Erasmo Downer, MD, MPH [facility] 03/27/2018 12:00 PM

## 2018-03-27 ENCOUNTER — Telehealth: Payer: Self-pay

## 2018-03-27 NOTE — Telephone Encounter (Signed)
-----   Message from Erasmo Downer, MD sent at 03/27/2018 11:21 AM EDT ----- No fractures of thoracic spine or ribs on either side.  Pain is musculoskeletal.  Beryle Flock Marzella Schlein, MD, MPH [facility] 03/27/2018 11:21 AM

## 2018-03-27 NOTE — Telephone Encounter (Signed)
Pt advised.

## 2018-03-30 ENCOUNTER — Telehealth: Payer: Self-pay | Admitting: Family Medicine

## 2018-03-30 MED ORDER — CYCLOBENZAPRINE HCL 10 MG PO TABS: 10.0000 mg | ORAL_TABLET | Freq: Three times a day (TID) | ORAL | 0 refills | Status: DC | PRN

## 2018-03-30 NOTE — Telephone Encounter (Signed)
Pt states the hydrocodone is not helping with her pain in her shoulder  Pt'c call back is [phone]  [facility] [address] [place]

## 2018-03-30 NOTE — Telephone Encounter (Signed)
Rx sent   Erasmo Downer, MD, MPH [facility] 03/30/2018 2:37 PM

## 2018-03-30 NOTE — Telephone Encounter (Signed)
There is no other medication is going to help with the pain.  Her x-rays were normal without any fractures.  She could consider seeing orthopedics if she feels like that would be helpful.  Otherwise, unsure what else other than time is going to help with this.  Erasmo Downer, MD, MPH [facility] 03/30/2018 11:03 AM

## 2018-03-30 NOTE — Telephone Encounter (Signed)
Pt is requesting a muscle relaxer. [facility] [place].

## 2018-03-30 NOTE — Telephone Encounter (Signed)
Please review

## 2018-03-31 ENCOUNTER — Telehealth: Payer: Self-pay | Admitting: Family Medicine

## 2018-03-31 NOTE — Telephone Encounter (Signed)
Any advise?

## 2018-03-31 NOTE — Telephone Encounter (Signed)
Pt called back saying she is still having a lot of pain,  She started taking the muscle relaxer's yesterday but they are not helping.  She said nothing is helping, pain medication or muscle relaxer. She still has the cough  Please advise  [phone]  Thanks teri

## 2018-04-01 ENCOUNTER — Encounter: Payer: Self-pay | Admitting: Family Medicine

## 2018-04-01 DIAGNOSIS — G4733 Obstructive sleep apnea (adult) (pediatric): Secondary | ICD-10-CM | POA: Diagnosis not present

## 2018-04-01 DIAGNOSIS — J439 Emphysema, unspecified: Secondary | ICD-10-CM | POA: Diagnosis not present

## 2018-04-01 NOTE — Telephone Encounter (Signed)
As patient and I have discussed in the past, this is likely a muscular pain that is going to take time to resolve.  There is no other medication to try other than her muscle relaxer and her pain medication which she has taken.  She is also tried prednisone at this point.  If she is in significant pain, she may want to be seen at the emergency department.  Erasmo Downer, MD, MPH [facility] 04/01/2018 12:17 PM

## 2018-04-01 NOTE — Telephone Encounter (Signed)
Patient advised as below.  Patient is reports that she is taking Flexeril 3 times daily and Tylenol and it is not helping pain at all. Patient is requesting something else that will help with pain. Patient also reports that she will go to ER if you think er will help her with pain. sd

## 2018-04-02 ENCOUNTER — Other Ambulatory Visit: Payer: Self-pay | Admitting: Family Medicine

## 2018-04-02 MED ORDER — DOXYCYCLINE HYCLATE 100 MG PO TABS: 100.0000 mg | ORAL_TABLET | Freq: Two times a day (BID) | ORAL | 0 refills | Status: AC

## 2018-04-02 NOTE — Telephone Encounter (Signed)
Responded to patient's my chart message.  Erasmo Downer, MD, MPH [facility] 04/02/2018 9:16 AM

## 2018-04-02 NOTE — Progress Notes (Signed)
doxy 

## 2018-04-03 DIAGNOSIS — J449 Chronic obstructive pulmonary disease, unspecified: Secondary | ICD-10-CM | POA: Diagnosis not present

## 2018-04-08 DIAGNOSIS — J441 Chronic obstructive pulmonary disease with (acute) exacerbation: Secondary | ICD-10-CM | POA: Diagnosis not present

## 2018-04-08 DIAGNOSIS — J439 Emphysema, unspecified: Secondary | ICD-10-CM | POA: Diagnosis not present

## 2018-04-08 DIAGNOSIS — J209 Acute bronchitis, unspecified: Secondary | ICD-10-CM | POA: Diagnosis not present

## 2018-04-15 DIAGNOSIS — F320 Major depressive disorder, single episode, mild: Secondary | ICD-10-CM | POA: Diagnosis not present

## 2018-04-29 ENCOUNTER — Other Ambulatory Visit: Payer: Self-pay | Admitting: Physician Assistant

## 2018-04-29 DIAGNOSIS — E038 Other specified hypothyroidism: Secondary | ICD-10-CM

## 2018-04-29 DIAGNOSIS — F331 Major depressive disorder, recurrent, moderate: Secondary | ICD-10-CM | POA: Diagnosis not present

## 2018-05-02 DIAGNOSIS — G4733 Obstructive sleep apnea (adult) (pediatric): Secondary | ICD-10-CM | POA: Diagnosis not present

## 2018-05-02 DIAGNOSIS — J439 Emphysema, unspecified: Secondary | ICD-10-CM | POA: Diagnosis not present

## 2018-05-04 DIAGNOSIS — J449 Chronic obstructive pulmonary disease, unspecified: Secondary | ICD-10-CM | POA: Diagnosis not present

## 2018-06-01 DIAGNOSIS — J439 Emphysema, unspecified: Secondary | ICD-10-CM | POA: Diagnosis not present

## 2018-06-01 DIAGNOSIS — G4733 Obstructive sleep apnea (adult) (pediatric): Secondary | ICD-10-CM | POA: Diagnosis not present

## 2018-06-03 DIAGNOSIS — J449 Chronic obstructive pulmonary disease, unspecified: Secondary | ICD-10-CM | POA: Diagnosis not present

## 2018-06-08 DIAGNOSIS — F320 Major depressive disorder, single episode, mild: Secondary | ICD-10-CM | POA: Diagnosis not present

## 2018-06-11 DIAGNOSIS — J439 Emphysema, unspecified: Secondary | ICD-10-CM | POA: Diagnosis not present

## 2018-06-11 DIAGNOSIS — J449 Chronic obstructive pulmonary disease, unspecified: Secondary | ICD-10-CM | POA: Diagnosis not present

## 2018-06-11 DIAGNOSIS — R0609 Other forms of dyspnea: Secondary | ICD-10-CM | POA: Diagnosis not present

## 2018-06-11 DIAGNOSIS — G4733 Obstructive sleep apnea (adult) (pediatric): Secondary | ICD-10-CM | POA: Diagnosis not present

## 2018-07-02 ENCOUNTER — Ambulatory Visit: Payer: Self-pay | Admitting: Family Medicine

## 2018-07-02 DIAGNOSIS — I719 Aortic aneurysm of unspecified site, without rupture: Secondary | ICD-10-CM | POA: Diagnosis not present

## 2018-07-02 DIAGNOSIS — G4733 Obstructive sleep apnea (adult) (pediatric): Secondary | ICD-10-CM | POA: Diagnosis not present

## 2018-07-02 DIAGNOSIS — I10 Essential (primary) hypertension: Secondary | ICD-10-CM | POA: Diagnosis not present

## 2018-07-02 DIAGNOSIS — J439 Emphysema, unspecified: Secondary | ICD-10-CM | POA: Diagnosis not present

## 2018-07-27 DIAGNOSIS — F331 Major depressive disorder, recurrent, moderate: Secondary | ICD-10-CM | POA: Diagnosis not present

## 2018-07-30 DIAGNOSIS — H18823 Corneal disorder due to contact lens, bilateral: Secondary | ICD-10-CM | POA: Diagnosis not present

## 2018-07-30 DIAGNOSIS — H16143 Punctate keratitis, bilateral: Secondary | ICD-10-CM | POA: Diagnosis not present

## 2018-10-26 ENCOUNTER — Other Ambulatory Visit: Payer: Self-pay | Admitting: Physician Assistant

## 2018-10-26 DIAGNOSIS — E038 Other specified hypothyroidism: Secondary | ICD-10-CM

## 2018-12-03 ENCOUNTER — Telehealth: Payer: Self-pay | Admitting: Family Medicine

## 2018-12-03 ENCOUNTER — Encounter: Payer: Self-pay | Admitting: Family Medicine

## 2018-12-03 ENCOUNTER — Other Ambulatory Visit: Payer: Self-pay | Admitting: Family Medicine

## 2018-12-03 ENCOUNTER — Ambulatory Visit (INDEPENDENT_AMBULATORY_CARE_PROVIDER_SITE_OTHER): Payer: 59 | Admitting: Family Medicine

## 2018-12-03 VITALS — BP 111/78 | HR 94 | Temp 97.7°F | Resp 16 | Wt 195.4 lb

## 2018-12-03 DIAGNOSIS — R110 Nausea: Secondary | ICD-10-CM

## 2018-12-03 DIAGNOSIS — Z952 Presence of prosthetic heart valve: Secondary | ICD-10-CM

## 2018-12-03 DIAGNOSIS — J441 Chronic obstructive pulmonary disease with (acute) exacerbation: Secondary | ICD-10-CM | POA: Diagnosis not present

## 2018-12-03 MED ORDER — AMOXICILLIN 875 MG PO TABS: 875.0000 mg | ORAL_TABLET | Freq: Two times a day (BID) | ORAL | 0 refills | Status: DC

## 2018-12-03 MED ORDER — PROMETHAZINE-DM 6.25-15 MG/5ML PO SYRP: 5.0000 mL | ORAL_SOLUTION | Freq: Four times a day (QID) | ORAL | 0 refills | Status: DC | PRN

## 2018-12-03 MED ORDER — PROMETHAZINE HCL 12.5 MG PO TABS: 12.5000 mg | ORAL_TABLET | Freq: Three times a day (TID) | ORAL | 0 refills | Status: DC | PRN

## 2018-12-03 NOTE — Telephone Encounter (Signed)
Pt calling to let Maurine Minister know promethazine-dextromethorphan (PROMETHAZINE-DM) 6.25-15 MG/5ML syrup [facility] [place].  Pt wanting to know if something else can be called in today or another location.  Please call pt back.  Thanks, [employer]

## 2018-12-03 NOTE — Progress Notes (Signed)
Patient: Nancy [name] Female    DOB: 04/27/1961   58 y.o.   MRN: [id number] Visit Date: 12/03/2018  Today's Provider: Dortha Kernennis Adayah Arocho, PA   Chief Complaint  Patient presents with  . Nausea   Subjective:     HPI  Patient comes in today feeling nauseas for a week. She has tried Zofran and is not helping. Some PND with rhinorrhea.  She also has a cough that started on Saturday. Some green sputum in the mornings and coughing hard. Tried Delsym, cough lozenge, Nyquil and Dayquil prn symptoms.  Past Medical History:  Diagnosis Date  . Allergy   . Depression   . Hypertension   . Sleep apnea   . Thyroid disease    Patient Active Problem List   Diagnosis Date Noted  . S/P complete hysterectomy 01/29/2018  . Urinary retention 01/02/2018  . Pain in right foot 10/14/2016  . Allergic rhinitis 09/18/2015  . 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 Anxiety 05/09/2015  . Breast lump 05/09/2015  . CAFL (chronic airflow limitation) (HCC) 05/09/2015  . Clinical depression 05/09/2015  . Abnormal LFTs 05/09/2015  . Hydronephrosis 05/09/2015  . Adult hypothyroidism 05/09/2015  . AI (aortic incompetence) 05/09/2015  . D-dimer, elevated 05/09/2015  . Breath shortness 05/09/2015  . 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 Avitaminosis D 05/09/2015  . Depression, major, single episode 05/09/2015  . Essential (primary) hypertension 05/09/2015  . Aortic valve defect 05/09/2015  . Chronic obstructive pulmonary disease (HCC) 05/09/2015  . History of open heart surgery 11/22/2013  . 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 Aortic dissection (HCC) 07/03/2011   Past Surgical History:  Procedure Laterality Date  . ABDOMINAL HYSTERECTOMY  2007  . AORTIC VALVE REPLACEMENT  06/2009  . APPENDECTOMY  1985  . CARPAL TUNNEL RELEASE Left 04/12/2013  . CATARACT EXTRACTION Right 1994  . FOOT SURGERY Left 1997-1998   Family History    Problem Relation Age of Onset  . Pneumonia Mother   . Breast cancer Mother 250       twice 50's and 8077  . Hypertension Father   . Heart disease Father   . Colon cancer Maternal Grandmother   . Heart disease Paternal Grandmother   . Healthy Sister   . Prostate cancer Neg Hx   . Kidney cancer Neg Hx    Allergies  Allergen Reactions  . Codeine Nausea Only    Can take cough syrup.    Current Outpatient Medications:  .  acetaminophen (TYLENOL) 500 MG tablet, Take 2 tablets by mouth every 6 (six) hours as needed., Disp: , Rfl:  .  albuterol-ipratropium (COMBIVENT) 18-103 MCG/ACT inhaler, Inhale 2 puffs into the lungs every 6 (six) hours as needed. , Disp: , Rfl:  .  amLODipine (NORVASC) 5 MG tablet, Take 1 tablet by mouth daily., Disp: , Rfl:  .  atorvastatin (LIPITOR) 10 MG tablet, Take 1 tablet (10 mg total) by mouth daily at 6 PM., Disp: 30 tablet, Rfl: 11 .  budesonide-formoterol (SYMBICORT) 160-4.5 MCG/ACT inhaler, Inhale 2 puffs into the lungs daily. , Disp: , Rfl:  .  buPROPion (WELLBUTRIN XL) 300 MG 24 hr tablet, Take 1 tablet by mouth daily., Disp: , Rfl:  .  carvedilol (COREG) 25 MG tablet, Take 1 tablet by mouth 2 (two) times daily., Disp: , Rfl:  .  chlorpheniramine-HYDROcodone (TUSSIONEX PENNKINETIC ER) 10-8 MG/5ML SUER, Take 5 mLs by mouth every 12 (twelve)  hours as needed for cough., Disp: 115 mL, Rfl: 0 .  cyclobenzaprine (FLEXERIL) 10 MG tablet, Take 1 tablet (10 mg total) by mouth 3 (three) times daily as needed for muscle spasms., Disp: 60 tablet, Rfl: 0 .  furosemide (LASIX) 40 MG tablet, Take 1 tablet by mouth daily., Disp: , Rfl:  .  levothyroxine (SYNTHROID, LEVOTHROID) 125 MCG tablet, TAKE 1 TABLET BY MOUTH DAILY, Disp: 90 tablet, Rfl: 1 .  montelukast (SINGULAIR) 10 MG tablet, Take 1 tablet (10 mg total) by mouth daily., Disp: 90 tablet, Rfl: 1 .  phentermine 37.5 MG capsule, Take 37.5 mg by mouth every morning., Disp: , Rfl:  .  topiramate (TOPAMAX) 25 MG tablet,  Take by mouth., Disp: , Rfl:  .  warfarin (COUMADIN) 2.5 MG tablet, Take 2 tablets by mouth as directed., Disp: , Rfl:   Current Facility-Administered Medications:  .  ipratropium-albuterol (DUONEB) 0.5-2.5 (3) MG/3ML nebulizer solution 3 mL, 3 mL, Nebulization, Once, Pollak, Adriana M, PA-C  Review of Systems  HENT: Positive for congestion.   Respiratory: Positive for cough and chest tightness.   Cardiovascular: Negative for chest pain, palpitations and leg swelling.  Gastrointestinal: Positive for nausea. Negative for diarrhea and vomiting.   Social History   Tobacco Use  . Smoking status: Former Smoker    Last attempt to quit: 03/11/2016    Years since quitting: 2.7  . Smokeless tobacco: Never Used  . Tobacco comment: patient states  that she is working at her own pace to quit smoking  Substance Use Topics  . Alcohol use: Yes    Alcohol/week: 0.0 standard drinks    Comment: OCCASIONALLY     Objective:   BP 111/78 (BP Location: Right Arm, Patient Position: Sitting, Cuff Size: Large)   Pulse 94   Temp 97.7 F (36.5 C) (Oral)   Resp 16   Wt 195 lb 6.4 oz (88.6 kg)   SpO2 96%   BMI 30.60 kg/m  Vitals:   12/03/18 1450  BP: 111/78  Pulse: 94  Resp: 16  Temp: 97.7 F (36.5 C)  TempSrc: Oral  SpO2: 96%  Weight: 195 lb 6.4 oz (88.6 kg)   Physical Exam Constitutional:      General: She is not in acute distress.    Appearance: She is well-developed.  HENT:     Head: Normocephalic and atraumatic.     Right Ear: Hearing normal.     Left Ear: Hearing normal.     Nose: Nose normal.  Eyes:     General: Lids are normal. No scleral icterus.       Right eye: No discharge.        Left eye: No discharge.     Conjunctiva/sclera: Conjunctivae normal.  Pulmonary:     Effort: Pulmonary effort is normal. No respiratory distress.  Musculoskeletal: Normal range of motion.  Skin:    Findings: No lesion or rash.  Neurological:     Mental Status: She is alert and oriented to  person, place, and time.  Psychiatric:        Speech: Speech normal.        Behavior: Behavior normal.        Thought Content: Thought content normal.       Assessment & Plan    1. Nausea Onset with cough and PND the past 5-6 days. No vomiting or diarrhea. Suspect secondary to hacking cough with PND and sputum production. No fever. Not much relief from trial of the Zofran she  has at home. Increase fluid intake and may use Ginger Ale with cough medications. Recheck if no better in 4-5 days.  2. COPD with acute exacerbation (HCC) Developed cough with green sputum production in the mornings with rhinorrhea on 11-28-18. Still using Symbicor 160-4.5 mcg/act 2 puffs daily and Combivent 18-103 mcg/act 2 puffs qid prn wheezing. Last follow up with pulmonologist (Dr. Meredeth IdeFleming) was on 11-10-18. Will give antibiotic and trial of Promethazine-DM for cough with nausea. Should up date Dr. Meredeth IdeFleming if no better in 4-5 days. - amoxicillin (AMOXIL) 875 MG tablet; Take 1 tablet (875 mg total) by mouth 2 (two) times daily.  Dispense: 20 tablet; Refill: 0 - promethazine-dextromethorphan (PROMETHAZINE-DM) 6.25-15 MG/5ML syrup; Take 5 mLs by mouth 4 (four) times daily as needed for cough.  Dispense: 118 mL; Refill: 0  3. Mechanical heart valve present Followed by Dr. Darrold JunkerParaschos (cardiologist) and last protime INR on 11-10-18 was 2.5. Continues to take Warfarin 2.5 mg 2 tablets daily. No chest pains or palpitations recently. Continue monthly recheck of protime with cardiologist.      Dortha Kernennis Pearle Wandler, PA  [facility]

## 2018-12-03 NOTE — Telephone Encounter (Signed)
[facility]ym BID for cough and Promethazine for nausea.

## 2018-12-03 NOTE — Telephone Encounter (Signed)
Please Review

## 2018-12-04 ENCOUNTER — Telehealth: Payer: Self-pay

## 2018-12-04 NOTE — Telephone Encounter (Signed)
Patient is calling office because [facility] [place] [facility] not [facility]  Prom[facility]azine- Dextromethophan in stock and dont anticipate having it available any time soon. Patient is requesting that we send in a different cough syrup? Patient is requesting that we also send in a prescription for nausea since she is at work. Patient request that these prescriptions be sent to [facility] at [address] in [place]. Patient states that pharmacy phone number is [phone]. Patient request that CMA give her a call back as soon as prescriptions have been sent to her pharmacy. Patient can be reached at either her cell or work number. KW

## 2018-12-04 NOTE — Telephone Encounter (Signed)
Patient advised.

## 2018-12-04 NOTE — Telephone Encounter (Signed)
Spoke with patient that Promethazine was sent in yesterday after hours [facility]. Also that the provider recommended for to take Delsym BID to help with the cough

## 2019-05-05 ENCOUNTER — Other Ambulatory Visit: Payer: Self-pay | Admitting: Family Medicine

## 2019-05-05 DIAGNOSIS — E038 Other specified hypothyroidism: Secondary | ICD-10-CM

## 2019-10-02 ENCOUNTER — Other Ambulatory Visit: Payer: Self-pay | Admitting: Family Medicine

## 2019-10-02 DIAGNOSIS — E038 Other specified hypothyroidism: Secondary | ICD-10-CM

## 2019-12-05 ENCOUNTER — Other Ambulatory Visit: Payer: Self-pay | Admitting: Family Medicine

## 2019-12-05 DIAGNOSIS — E038 Other specified hypothyroidism: Secondary | ICD-10-CM

## 2019-12-05 NOTE — Telephone Encounter (Signed)
Requested medication (s) are due for refill today: no  Requested medication (s) are on the active medication list: yes  Last refill:  10/04/19  Future visit scheduled: no  Notes to clinic:  overdue for an OV   Requested Prescriptions  Pending Prescriptions Disp Refills   levothyroxine (SYNTHROID) 125 MCG tablet [Pharmacy Med Name: LEVOTHYROXINE 125 MCG TABLET] 90 tablet 0    Sig: TAKE 1 TABLET BY MOUTH EVERY DAY      Endocrinology:  Hypothyroid Agents Failed - 12/05/2019 10:16 AM      Failed - TSH needs to be rechecked within 3 months after an abnormal result. Refill until TSH is due.      Failed - TSH in normal range and within 360 days    TSH  Date Value Ref Range Status  01/05/2018 4.250 0.450 - 4.500 uIU/mL Final          Failed - Valid encounter within last 12 months    Recent Outpatient Visits           1 year ago Nausea   [facility] Chrismon, Jodell Cipro, [place]   1 year ago Cough   [facility] [place], Marzella Schlein, MD   1 year ago COPD exacerbation [facility])   [facility], [place], PA-C   1 year ago Chronic obstructive pulmonary disease with acute exacerbation [facility])   [facility] [place], Marzella Schlein, MD   1 year ago Encounter for annual physical exam   [facility], Marzella Schlein, MD

## 2019-12-06 NOTE — Telephone Encounter (Signed)
No TSH since 12/2017

## 2019-12-13 ENCOUNTER — Other Ambulatory Visit: Payer: Self-pay

## 2019-12-13 ENCOUNTER — Ambulatory Visit (INDEPENDENT_AMBULATORY_CARE_PROVIDER_SITE_OTHER): Payer: 59 | Admitting: Family Medicine

## 2019-12-13 ENCOUNTER — Encounter: Payer: Self-pay | Admitting: Family Medicine

## 2019-12-13 VITALS — BP 125/86 | HR 86 | Temp 96.3°F | Wt 205.0 lb

## 2019-12-13 DIAGNOSIS — F3342 Major depressive disorder, recurrent, in full remission: Secondary | ICD-10-CM | POA: Diagnosis not present

## 2019-12-13 DIAGNOSIS — E785 Hyperlipidemia, unspecified: Secondary | ICD-10-CM | POA: Insufficient documentation

## 2019-12-13 DIAGNOSIS — E039 Hypothyroidism, unspecified: Secondary | ICD-10-CM | POA: Diagnosis not present

## 2019-12-13 DIAGNOSIS — I10 Essential (primary) hypertension: Secondary | ICD-10-CM | POA: Diagnosis not present

## 2019-12-13 DIAGNOSIS — F419 Anxiety disorder, unspecified: Secondary | ICD-10-CM

## 2019-12-13 DIAGNOSIS — E782 Mixed hyperlipidemia: Secondary | ICD-10-CM

## 2019-12-13 DIAGNOSIS — J411 Mucopurulent chronic bronchitis: Secondary | ICD-10-CM

## 2019-12-13 DIAGNOSIS — E038 Other specified hypothyroidism: Secondary | ICD-10-CM

## 2019-12-13 DIAGNOSIS — Z7901 Long term (current) use of anticoagulants: Secondary | ICD-10-CM

## 2019-12-13 MED ORDER — LEVOTHYROXINE SODIUM 125 MCG PO TABS: 125.0000 ug | ORAL_TABLET | Freq: Every day | ORAL | 1 refills | Status: DC

## 2019-12-13 MED ORDER — VALACYCLOVIR HCL 1 G PO TABS: 2000.0000 mg | ORAL_TABLET | Freq: Two times a day (BID) | ORAL | 5 refills | Status: AC

## 2019-12-13 NOTE — Assessment & Plan Note (Signed)
Chronic and well-controlled Previously followed by psychiatry, but seems that she has not been seen in several months She is now off of all of her medications Continue to monitor

## 2019-12-13 NOTE — Assessment & Plan Note (Signed)
Chronic and well-controlled Previously followed by psychiatry, but seems that she has not been seen in several months She is now off of her medications Continue to monitor

## 2019-12-13 NOTE — Assessment & Plan Note (Signed)
Chronic, stable Followed by pulmonology Exacerbations have decreased Continue current medications

## 2019-12-13 NOTE — Assessment & Plan Note (Signed)
Well controlled Continue current medications Recheck metabolic panel F/u in 6 months  

## 2019-12-13 NOTE — Progress Notes (Addendum)
Patient: Nancy [name] Female    DOB: 01/25/1961   59 y.o.   MRN: [id number] Visit Date: 12/13/2019  Today's Provider: Lavon Paganini, MD   Chief Complaint  Patient presents with  . Hypothyroidism  . Hypertension  . Hyperlipidemia  . Depression   Subjective:     Hypertension The problem is unchanged. The problem is controlled. Pertinent negatives include no blurred vision, headaches, malaise/fatigue, neck pain, palpitations, peripheral edema, shortness of breath or sweats. There are no associated agents to hypertension. There are no compliance problems.  Identifiable causes of hypertension include a thyroid problem.  Thyroid Problem Presents for follow-up visit. Patient reports no anxiety, depressed mood, fatigue, heat intolerance, hoarse voice, leg swelling, palpitations, visual change, weight gain or weight loss. The symptoms have been stable.  Depression        This is a chronic problem.  Associated symptoms include no fatigue, no restlessness, no decreased interest, no headaches and not sad.  Past medical history includes thyroid problem.     Allergies  Allergen Reactions  . Codeine Nausea Only    Can take cough syrup.     Current Outpatient Medications:  .  acetaminophen (TYLENOL) 500 MG tablet, Take 2 tablets by mouth every 6 (six) hours as needed., Disp: , Rfl:  .  albuterol-ipratropium (COMBIVENT) 18-103 MCG/ACT inhaler, Inhale 2 puffs into the lungs every 6 (six) hours as needed. , Disp: , Rfl:  .  amLODipine (NORVASC) 5 MG tablet, Take 1 tablet by mouth daily., Disp: , Rfl:  .  budesonide-formoterol (SYMBICORT) 160-4.5 MCG/ACT inhaler, Inhale 2 puffs into the lungs daily. , Disp: , Rfl:  .  carvedilol (COREG) 25 MG tablet, Take 1 tablet by mouth 2 (two) times daily., Disp: , Rfl:  .  furosemide (LASIX) 40 MG tablet, Take 1 tablet by mouth daily., Disp: , Rfl:  .  levothyroxine (SYNTHROID) 125 MCG tablet, Take 1 tablet (125 mcg total) by mouth daily., Disp: 90  tablet, Rfl: 1 .  phentermine 37.5 MG capsule, Take 37.5 mg by mouth every morning., Disp: , Rfl:  .  warfarin (COUMADIN) 2.5 MG tablet, Take 2 tablets by mouth as directed., Disp: , Rfl:  .  topiramate (TOPAMAX) 25 MG tablet, Take by mouth., Disp: , Rfl:  .  valACYclovir (VALTREX) 1000 MG tablet, Take 2 tablets (2,000 mg total) by mouth 2 (two) times daily for 1 day., Disp: 4 tablet, Rfl: 5  Current Facility-Administered Medications:  .  ipratropium-albuterol (DUONEB) 0.5-2.5 (3) MG/3ML nebulizer solution 3 mL, 3 mL, Nebulization, Once, Pollak, Adriana M, PA-C  Review of Systems  Constitutional: Negative.  Negative for fatigue, malaise/fatigue, weight gain and weight loss.  HENT: Negative for hoarse voice.   Eyes: Negative for blurred vision.  Respiratory: Negative for shortness of breath.   Cardiovascular: Negative for palpitations.  Gastrointestinal: Negative.   Endocrine: Negative.  Negative for heat intolerance.  Musculoskeletal: Negative for neck pain.  Neurological: Negative for dizziness, light-headedness and headaches.  Psychiatric/Behavioral: Positive for depression. The patient is not nervous/anxious.     Social History   Tobacco Use  . Smoking status: Former Smoker    Quit date: 03/11/2016    Years since quitting: 3.7  . Smokeless tobacco: Never Used  . Tobacco comment: patient states  that she is working at her own pace to quit smoking  Substance Use Topics  . Alcohol use: Yes    Alcohol/week: 0.0 standard drinks    Comment: OCCASIONALLY  Objective:   BP 125/86 (BP Location: Left Arm, Patient Position: Sitting, Cuff Size: Large)   Pulse 86   Temp (!) 96.3 F (35.7 C) (Temporal)   Wt 205 lb (93 kg)   BMI 32.11 kg/m  Vitals:   12/13/19 0816  BP: 125/86  Pulse: 86  Temp: (!) 96.3 F (35.7 C)  TempSrc: Temporal  Weight: 205 lb (93 kg)  Body mass index is 32.11 kg/m.   Physical Exam Vitals reviewed.  Constitutional:      General: She is not in  acute distress.    Appearance: Normal appearance. She is well-developed. She is not diaphoretic.  HENT:     Head: Normocephalic and atraumatic.  Eyes:     General: No scleral icterus.    Conjunctiva/sclera: Conjunctivae normal.  Neck:     Thyroid: No thyromegaly.  Cardiovascular:     Rate and Rhythm: Normal rate and regular rhythm.     Pulses: Normal pulses.     Heart sounds: Murmur present.  Pulmonary:     Effort: Pulmonary effort is normal. No respiratory distress.     Breath sounds: Normal breath sounds. No wheezing, rhonchi or rales.  Musculoskeletal:     Cervical back: Neck supple.     Right lower leg: No edema.     Left lower leg: No edema.  Lymphadenopathy:     Cervical: No cervical adenopathy.  Skin:    General: Skin is warm and dry.     Capillary Refill: Capillary refill takes less than 2 seconds.     Findings: No rash.  Neurological:     Mental Status: She is alert and oriented to person, place, and time. Mental status is at baseline.  Psychiatric:        Mood and Affect: Mood normal.        Behavior: Behavior normal.      No results found for any visits on 12/13/19.  Depression screen [facility] 2/9 12/13/2019 01/02/2018  Decreased Interest 0 1  Down, Depressed, Hopeless 0 0  PHQ - 2 Score 0 1  Altered sleeping 0 -  Tired, decreased energy 0 -  Change in appetite 0 -  Feeling bad or failure about yourself  0 -  Trouble concentrating 0 -  Moving slowly or fidgety/restless 0 -  Suicidal thoughts 0 -  PHQ-9 Score 0 -  Difficult doing work/chores Not difficult at all -    GAD 7 : Generalized Anxiety Score 12/13/2019  Nervous, Anxious, on Edge 0  Control/stop worrying 0  Worry too much - different things 0  Trouble relaxing 0  Restless 0  Easily annoyed or irritable 0  Afraid - awful might happen 0  Total GAD 7 Score 0  Anxiety Difficulty Not difficult at all        Assessment & Plan    Problem List Items Addressed This Visit      Cardiovascular and  Mediastinum   Essential (primary) hypertension - Primary    Well controlled Continue current medications Recheck metabolic panel F/u in 6 months       Relevant Orders   Comprehensive metabolic panel     Respiratory   Chronic obstructive pulmonary disease (HCC)    Chronic, stable Followed by pulmonology Exacerbations have decreased Continue current medications        Endocrine   Adult hypothyroidism    Previously well controlled Continue Synthroid at current dose  Recheck TSH and adjust Synthroid as indicated  Relevant Medications   levothyroxine (SYNTHROID) 125 MCG tablet   Other Relevant Orders   TSH     Other   Anxiety    Chronic and well-controlled Previously followed by psychiatry, but seems that she has not been seen in several months She is now off of all of her medications Continue to monitor      Clinical depression    Chronic and well-controlled Previously followed by psychiatry, but seems that she has not been seen in several months She is now off of her medications Continue to monitor      Hyperlipidemia    Chronic and previously well controlled She does report she is no longer taking her atorvastatin Recheck CMP and FLP Calculate ASCVD risk and determine need for statin going forward      Relevant Orders   Comprehensive metabolic panel   Lipid panel    Other Visit Diagnoses    Current use of long term anticoagulation       Relevant Orders   CBC   Other specified hypothyroidism       Relevant Medications   levothyroxine (SYNTHROID) 125 MCG tablet       Return in about 3 months (around 03/11/2020) for CPE.   The entirety of the information documented in the History of Present Illness, Review of Systems and Physical Exam were personally obtained by me. Portions of this information were initially documented by Kavin Leech, CMA and reviewed by me for thoroughness and accuracy.    Gwendolyne Welford, Marzella Schlein, MD MPH [facility]

## 2019-12-13 NOTE — Assessment & Plan Note (Signed)
Previously well controlled Continue Synthroid at current dose  Recheck TSH and adjust Synthroid as indicated   

## 2019-12-13 NOTE — Assessment & Plan Note (Signed)
Chronic and previously well controlled She does report she is no longer taking her atorvastatin Recheck CMP and FLP Calculate ASCVD risk and determine need for statin going forward

## 2020-02-16 ENCOUNTER — Other Ambulatory Visit: Payer: Self-pay

## 2020-02-16 ENCOUNTER — Ambulatory Visit (INDEPENDENT_AMBULATORY_CARE_PROVIDER_SITE_OTHER): Payer: Managed Care, Other (non HMO) | Admitting: Dermatology

## 2020-02-16 DIAGNOSIS — L719 Rosacea, unspecified: Secondary | ICD-10-CM

## 2020-02-16 DIAGNOSIS — L988 Other specified disorders of the skin and subcutaneous tissue: Secondary | ICD-10-CM

## 2020-02-16 MED ORDER — DOXYCYCLINE 40 MG PO CPDR: DELAYED_RELEASE_CAPSULE | ORAL | 3 refills | Status: DC

## 2020-02-16 NOTE — Progress Notes (Signed)
   Follow-Up Visit   Subjective  NARAYA STONEBERG is a 59 y.o. female who presents for the following: Facial Elastosis (Patient is here for fillers today in the chin and oral commisures. ) and rosacea (Patient is currently using Metronidazole 0.75% gel QD, but is still experiencing flares. ). She is under a lot of stress and wears a mask for COVID both of which may be flaring her rosacea.  We discussed again today that the new conventional wisdom with fillers is to fill the mid face along with the oral commisures to give the best results by lifting and filling the mid face volume loss.  The pt understands this but has been happy with filling the marionette lines only and wants to continue with this but may consider mid face filling in future.  The following portions of the chart were reviewed this encounter and updated as appropriate:    Review of Systems: No other skin or systemic complaints.  Objective  Well appearing patient in no apparent distress; mood and affect are within normal limits.  A focused examination was performed including the face. Relevant physical exam findings are noted in the Assessment and Plan.  Objective  Face: Rhytides and volume loss.   Images                    Objective  Face: Erythematous papules and erythema   Assessment & Plan  Elastosis of skin Face  Filling material injection - Face Prior to the procedure, the patient's past medical history, allergies and the rare but potential risks and complications were reviewed with the patient and a signed consent was obtained. Pre and post-treatment care was discussed and instructions provided.  Location: oral commisures/marionette lines; corners of mouth, anterior chin crease  Filler Type: Restylane defyne  Lot Number: [id number]  Expiration date: 07/11/2021  Procedure: The area was prepped thoroughly with hibiclens The area was prepped thoroughly with Puracyn. After introducing the needle  into the desired treatment area, the syringe plunger was drawn back to ensure there was no flash of blood prior to injecting the filler in order to minimize risk of intravascular injection and vascular occlusion. After injection of the filler, the treated areas were cleansed and iced to reduce swelling. Post-treatment instructions were reviewed with the patient.       Patient tolerated the procedure well. The patient will call with any problems, questions or concerns prior to their next appointment.   Rosacea Face With acne and perioral dermatitis - FLARED possibly related to stress and mask wearing.  Continue Metronidazole 0.75% gel QHS.  Start Doxycycline 40mg  po QD take with food. Advised patient that it may affect her Coumadin levels but she said she has them checked regularly and it is a low dose. Doxycycline should be taken with food to prevent nausea. Do not lay down for 30 minutes after taking. Be cautious with sun exposure and use good sun protection while on this medication. Pregnant women should not take this medication.   Consider Ivermectin/containing products if not improved at follow up appointment.   doxycycline (ORACEA) 40 MG capsule - Face  Return in 2 months (on 04/17/2020).   06/17/2020, MD, am acting as scribe for Kirke Shaggy, MD .

## 2020-02-17 ENCOUNTER — Encounter: Payer: Self-pay | Admitting: Dermatology

## 2020-05-01 ENCOUNTER — Ambulatory Visit (INDEPENDENT_AMBULATORY_CARE_PROVIDER_SITE_OTHER): Payer: Managed Care, Other (non HMO) | Admitting: Dermatology

## 2020-05-01 ENCOUNTER — Other Ambulatory Visit: Payer: Self-pay

## 2020-05-01 DIAGNOSIS — L719 Rosacea, unspecified: Secondary | ICD-10-CM

## 2020-05-01 NOTE — Progress Notes (Signed)
   Follow-Up Visit   Subjective  Nancy [name] is a 59 y.o. female who presents for the following: Rosacea (2 months f/u Rosacea, treating with Doxycycline 40mg  daily, Metrocream with a good response ).  The following portions of the chart were reviewed this encounter and updated as appropriate:  Tobacco  Allergies  Meds  Problems  Med Hx  Surg Hx  Fam Hx      Review of Systems:  No other skin or systemic complaints except as noted in HPI or Assessment and Plan.  Objective  Well appearing patient in no apparent distress; mood and affect are within normal limits.  A focused examination was performed including face . Relevant physical exam findings are noted in the Assessment and Plan.  Objective  Head - Anterior (Face): 2 tiny papules on the nose, mild pinkness    Assessment & Plan    Rosacea Head - Anterior (Face) Improved since starting treatment.  Pt is pleased.  In the future we may consider Skin medicinals Rosacea triple cream   Cont Doxycycline 40 mg take 1 tablet daily  Cont Metrogel 0.75% cream apply to face qhs   Other Related Medications doxycycline (ORACEA) 40 MG capsule  Return for follow up as scheduled . I , CMA, am acting as scribe for Angelique Holm, MD .  Documentation: I have reviewed the above documentation for accuracy and completeness, and I agree with the above.  Armida Sans, MD

## 2020-05-02 ENCOUNTER — Encounter: Payer: Self-pay | Admitting: Dermatology

## 2020-05-13 ENCOUNTER — Other Ambulatory Visit: Payer: Self-pay | Admitting: Dermatology

## 2020-05-13 DIAGNOSIS — L719 Rosacea, unspecified: Secondary | ICD-10-CM

## 2020-08-26 ENCOUNTER — Other Ambulatory Visit: Payer: Self-pay | Admitting: Family Medicine

## 2020-08-26 DIAGNOSIS — E038 Other specified hypothyroidism: Secondary | ICD-10-CM

## 2020-08-26 NOTE — Telephone Encounter (Signed)
Requested medication (s) are due for refill today: yes  Requested medication (s) are on the active medication list: yes  Last refill:  12/13/19  Future visit scheduled: no  Notes to clinic:  over lab work   Requested Prescriptions  Pending Prescriptions Disp Refills   levothyroxine (SYNTHROID) 125 MCG tablet [Pharmacy Med Name: LEVOTHYROXINE SODIUM 125 MCG TAB] 90 tablet 1    Sig: TAKE 1 TABLET EVERY DAY ON EMPTY STOMACHWITH A GLASS OF WATER AT LEAST 30-60 MINBEFORE BREAKFAST      Endocrinology:  Hypothyroid Agents Failed - 08/26/2020  9:46 AM      Failed - TSH needs to be rechecked within 3 months after an abnormal result. Refill until TSH is due.      Failed - TSH in normal range and within 360 days    TSH  Date Value Ref Range Status  01/05/2018 4.250 0.450 - 4.500 uIU/mL Final          Passed - Valid encounter within last 12 months    Recent Outpatient Visits           8 months ago Essential (primary) hypertension   [facility], Marzella Schlein, [place]   1 year ago Nausea   [facility] [place], [place], [place]   2 years ago Cough   [facility] [place], Marzella Schlein, [place]   2 years ago COPD exacerbation [facility])   [facility] [place], [place], [place]   2 years ago Chronic obstructive pulmonary disease with acute exacerbation [facility])   [facility], Marzella Schlein, [place]

## 2020-08-30 ENCOUNTER — Ambulatory Visit: Payer: Managed Care, Other (non HMO) | Admitting: Dermatology

## 2020-08-30 ENCOUNTER — Ambulatory Visit (INDEPENDENT_AMBULATORY_CARE_PROVIDER_SITE_OTHER): Payer: Self-pay | Admitting: Dermatology

## 2020-08-30 ENCOUNTER — Other Ambulatory Visit: Payer: Self-pay

## 2020-08-30 DIAGNOSIS — L988 Other specified disorders of the skin and subcutaneous tissue: Secondary | ICD-10-CM

## 2020-08-30 NOTE — Progress Notes (Signed)
   Follow-Up Visit   Subjective  Nancy [name] is a 59 y.o. female who presents for the following: Facial Elastosis (face, last filler 02/16/20).  The following portions of the chart were reviewed this encounter and updated as appropriate:  Tobacco  Allergies  Meds  Problems  Med Hx  Surg Hx  Fam Hx     Review of Systems:  No other skin or systemic complaints except as noted in HPI or Assessment and Plan.  Objective  Well appearing patient in no apparent distress; mood and affect are within normal limits.  A focused examination was performed including face. Relevant physical exam findings are noted in the Assessment and Plan.  Objective  face: Rhytides and volume loss.   Images           Assessment & Plan  Elastosis of skin face Pt mostly concerned about deep oral commissures that we have treated with very good success over the past year or so.  She is pleased with results.  Nevertheless, we again discussed that mid face fillers with Voluma would also help significantly - but the pt declines this again today.  We also discussed and recommend Botox in DOAs and consideration for Botox in frown complex - she also declines this today.  Restylane Defyne 28ml syringe injected today as follow: - Corners of mouth/Marionette lines/oral commissures  Lot #[id number] exp 2022-01-08  Discussed Botox 4 units in each DAO, pt declined today.  Filling material injection - face Prior to the procedure, the patient's past medical history, allergies and the rare but potential risks and complications were reviewed with the patient and a signed consent was obtained. Pre and post-treatment care was discussed and instructions provided.  Location: marionette lines  Filler Type: Restylane defyne  Procedure: The area was prepped thoroughly with hibiclens After introducing the needle into the desired treatment area, the syringe plunger was drawn back to ensure there was no flash of blood prior  to injecting the filler in order to minimize risk of intravascular injection and vascular occlusion. After injection of the filler, the treated areas were cleansed and iced to reduce swelling. Post-treatment instructions were reviewed with the patient.       Patient tolerated the procedure well. The patient will call with any problems, questions or concerns prior to their next appointment.   Return in about 6 months (around 02/28/2021) for fillers.   I, Ardis Rowan, RMA, am acting as scribe for Armida Sans, MD .  Documentation: I have reviewed the above documentation for accuracy and completeness, and I agree with the above.  Armida Sans, MD

## 2020-08-31 ENCOUNTER — Encounter: Payer: Self-pay | Admitting: Dermatology

## 2020-09-18 ENCOUNTER — Other Ambulatory Visit: Payer: Self-pay

## 2020-09-18 ENCOUNTER — Ambulatory Visit (INDEPENDENT_AMBULATORY_CARE_PROVIDER_SITE_OTHER): Payer: 59 | Admitting: Family Medicine

## 2020-09-18 ENCOUNTER — Other Ambulatory Visit: Payer: Self-pay | Admitting: Dermatology

## 2020-09-18 ENCOUNTER — Encounter: Payer: Self-pay | Admitting: Family Medicine

## 2020-09-18 VITALS — BP 135/70 | HR 85 | Temp 98.4°F | Wt 207.0 lb

## 2020-09-18 DIAGNOSIS — I10 Essential (primary) hypertension: Secondary | ICD-10-CM

## 2020-09-18 DIAGNOSIS — Z1231 Encounter for screening mammogram for malignant neoplasm of breast: Secondary | ICD-10-CM

## 2020-09-18 DIAGNOSIS — J441 Chronic obstructive pulmonary disease with (acute) exacerbation: Secondary | ICD-10-CM

## 2020-09-18 DIAGNOSIS — Z0000 Encounter for general adult medical examination without abnormal findings: Secondary | ICD-10-CM

## 2020-09-18 DIAGNOSIS — Z7901 Long term (current) use of anticoagulants: Secondary | ICD-10-CM | POA: Diagnosis not present

## 2020-09-18 DIAGNOSIS — Z952 Presence of prosthetic heart valve: Secondary | ICD-10-CM

## 2020-09-18 DIAGNOSIS — Z13820 Encounter for screening for osteoporosis: Secondary | ICD-10-CM

## 2020-09-18 DIAGNOSIS — E782 Mixed hyperlipidemia: Secondary | ICD-10-CM

## 2020-09-18 DIAGNOSIS — E559 Vitamin D deficiency, unspecified: Secondary | ICD-10-CM

## 2020-09-18 DIAGNOSIS — L719 Rosacea, unspecified: Secondary | ICD-10-CM

## 2020-09-18 DIAGNOSIS — E039 Hypothyroidism, unspecified: Secondary | ICD-10-CM | POA: Diagnosis not present

## 2020-09-18 MED ORDER — AZITHROMYCIN 250 MG PO TABS: ORAL_TABLET | ORAL | 0 refills | Status: DC

## 2020-09-18 NOTE — Progress Notes (Signed)
Complete physical exam   Patient: Nancy [name]   DOB: February 03, 1961   58 y.o. Female  MRN: [id number] Visit Date: 09/18/2020  Today's healthcare provider: Dortha Kern, PA   No chief complaint on file.  Subjective    Nancy [name] is a 59 y.o. female who presents today for a complete physical exam.  She reports consuming a general diet.  She generally feels fairly well. She reports sleeping well. She does not have additional problems to discuss today.  HPI    Past Medical History:  Diagnosis Date  . Allergy   . Depression   . Hypertension   . Rosacea   . Sleep apnea   . Thyroid disease    Past Surgical History:  Procedure Laterality Date  . ABDOMINAL HYSTERECTOMY  2007  . AORTIC VALVE REPLACEMENT  06/2009  . APPENDECTOMY  1985  . CARPAL TUNNEL RELEASE Left 04/12/2013  . CATARACT EXTRACTION Right 1994  . FOOT SURGERY Left 1997-1998   Social History   Socioeconomic History  . Marital status: Single    Spouse name: Not on file  . Number of children: 0  . Years of education: 30  . Highest education level: High school graduate  Occupational History    Employer: [employer]  Tobacco Use  . Smoking status: Former Smoker    Quit date: 03/11/2016    Years since quitting: 4.5  . Smokeless tobacco: Never Used  . Tobacco comment: patient states  that she is working at her own pace to quit smoking  Vaping Use  . Vaping Use: Former  Substance and Sexual Activity  . Alcohol use: Yes    Alcohol/week: 0.0 standard drinks    Comment: OCCASIONALLY  . Drug use: No  . Sexual activity: Not on file  Other Topics Concern  . Not on file  Social History Narrative  . Not on file   Social Determinants of Health   Financial Resource Strain:   . Difficulty of Paying Living Expenses: Not on file  Food Insecurity:   . Worried About Programme researcher, broadcasting/film/video in the Last Year: Not on file  . Ran Out of Food in the Last Year: Not on file  Transportation Needs:   . Lack of  Transportation (Medical): Not on file  . Lack of Transportation (Non-Medical): Not on file  Physical Activity:   . Days of Exercise per Week: Not on file  . Minutes of Exercise per Session: Not on file  Stress:   . Feeling of Stress : Not on file  Social Connections:   . Frequency of Communication with Friends and Family: Not on file  . Frequency of Social Gatherings with Friends and Family: Not on file  . Attends Religious Services: Not on file  . Active Member of Clubs or Organizations: Not on file  . Attends Banker Meetings: Not on file  . Marital Status: Not on file  Intimate Partner Violence:   . Fear of Current or Ex-Partner: Not on file  . Emotionally Abused: Not on file  . Physically Abused: Not on file  . Sexually Abused: Not on file   Family Status  Relation Name Status  . Mother  Alive  . Father  Deceased  . MGM  Deceased  . PGM  Deceased  . MGF  Deceased  . PGF  Deceased  . Sister  Alive  . Neg Hx  (Not Specified)   Family History  Problem Relation Age  of Onset  . Pneumonia Mother   . Breast cancer Mother 5150       twice 50's and 6277  . Hypertension Father   . Heart disease Father   . Colon cancer Maternal Grandmother   . Heart disease Paternal Grandmother   . Healthy Sister   . Prostate cancer Neg Hx   . Kidney cancer Neg Hx    Allergies  Allergen Reactions  . Codeine Nausea Only    Can take cough syrup.    Patient Care Team: Tearia Gibbs, Jodell Ciproennis E, PA as PCP - General (Family Medicine)   Medications: Outpatient Medications Prior to Visit  Medication Sig  . acetaminophen (TYLENOL) 500 MG tablet Take 2 tablets by mouth every 6 (six) hours as needed.  Marland Kitchen. albuterol-ipratropium (COMBIVENT) 18-103 MCG/ACT inhaler Inhale 2 puffs into the lungs every 6 (six) hours as needed.   Marland Kitchen. amLODipine (NORVASC) 5 MG tablet Take 1 tablet by mouth daily.  . budesonide-formoterol (SYMBICORT) 160-4.5 MCG/ACT inhaler Inhale 2 puffs into the lungs daily.   .  carvedilol (COREG) 25 MG tablet Take 1 tablet by mouth 2 (two) times daily.  Marland Kitchen. doxycycline (ORACEA) 40 MG capsule TAKE 1 CAPSULE BY MOUTH DAILY WITH FOOD  . furosemide (LASIX) 40 MG tablet Take 1 tablet by mouth daily.  Marland Kitchen. levothyroxine (SYNTHROID) 125 MCG tablet Take 1 tablet (125 mcg total) by mouth daily before breakfast. Please schedule a physical before anymore refills.  . metroNIDAZOLE (METROGEL) 0.75 % gel Apply 1 application topically 2 (two) times daily.  Marland Kitchen. warfarin (COUMADIN) 2.5 MG tablet Take 2 tablets by mouth as directed.  . phentermine 37.5 MG capsule Take 37.5 mg by mouth every morning. (Patient not taking: Reported on 09/18/2020)  . topiramate (TOPAMAX) 25 MG tablet Take by mouth.   Facility-Administered Medications Prior to Visit  Medication Dose Route Frequency Provider  . ipratropium-albuterol (DUONEB) 0.5-2.5 (3) MG/3ML nebulizer solution 3 mL  3 mL Nebulization Once Trey SailorsPollak, Adriana M, PA-C    Review of Systems  Constitutional: Negative.   HENT: Positive for congestion and postnasal drip.   Eyes: Negative.   Respiratory: Positive for shortness of breath (chronic and unchanged).   Cardiovascular: Negative.   Gastrointestinal: Negative.   Endocrine: Negative.   Genitourinary: Negative.   Musculoskeletal: Negative.   Skin: Negative.   Allergic/Immunologic: Negative.   Neurological: Negative.   Hematological: Negative.   Psychiatric/Behavioral: Negative.       Objective    BP 135/70 (BP Location: Right Arm, Patient Position: Sitting, Cuff Size: Normal)   Pulse 85   Temp 98.4 F (36.9 C) (Oral)   Wt 207 lb (93.9 kg)   SpO2 96%   BMI 32.42 kg/m    BP Readings from Last 3 Encounters:  09/18/20 135/70  12/13/19 125/86  12/03/18 111/78   Wt Readings from Last 3 Encounters:  09/18/20 207 lb (93.9 kg)  12/13/19 205 lb (93 kg)  12/03/18 195 lb 6.4 oz (88.6 kg)      Physical Exam Constitutional:      Appearance: Normal appearance. She is normal weight.    HENT:     Head: Normocephalic and atraumatic.     Right Ear: Tympanic membrane, ear canal and external ear normal.     Left Ear: Tympanic membrane, ear canal and external ear normal.     Nose: Nose normal.     Mouth/Throat:     Mouth: Mucous membranes are moist.     Pharynx: Oropharynx is clear.  Eyes:  Extraocular Movements: Extraocular movements intact.     Conjunctiva/sclera: Conjunctivae normal.     Pupils: Pupils are equal, round, and reactive to light.  Cardiovascular:     Rate and Rhythm: Normal rate and regular rhythm.     Pulses: Normal pulses.     Heart sounds: Normal heart sounds.  Pulmonary:     Effort: Pulmonary effort is normal.     Breath sounds: Normal breath sounds.  Abdominal:     General: Abdomen is flat. Bowel sounds are normal.     Palpations: Abdomen is soft.  Musculoskeletal:        General: Normal range of motion.     Cervical back: Normal range of motion and neck supple.  Skin:    General: Skin is warm and dry.  Neurological:     General: No focal deficit present.     Mental Status: She is alert and oriented to person, place, and time. Mental status is at baseline.  Psychiatric:        Mood and Affect: Mood normal.        Behavior: Behavior normal.        Thought Content: Thought content normal.        Judgment: Judgment normal.       Last depression screening scores PHQ 2/9 Scores 09/18/2020 12/13/2019 01/02/2018  PHQ - 2 Score 0 0 1  PHQ- 9 Score - 0 -  Exception Documentation - - -  Not completed - - -   Last fall risk screening Fall Risk  09/18/2020  Falls in the past year? 0  Number falls in past yr: 0  Injury with Fall? 0   Last Audit-C alcohol use screening Alcohol Use Disorder Test (AUDIT) 09/18/2020  1. How often do you have a drink containing alcohol? 3  2. How many drinks containing alcohol do you have on a typical day when you are drinking? 0  3. How often do you have six or more drinks on one occasion? 1  AUDIT-C Score 4   Alcohol Brief Interventions/Follow-up AUDIT Score <7 follow-up not indicated   A score of 3 or more in women, and 4 or more in men indicates increased risk for alcohol abuse, EXCEPT if all of the points are from question 1   No results found for any visits on 09/18/20.  Assessment & Plan    Routine Health Maintenance and Physical Exam  Exercise Activities and Dietary recommendations Goals    . Exercise 150 minutes per week (moderate activity)       Immunization History  Administered Date(s) Administered  . Td 11/01/2005  . Tdap 06/12/2012    Health Maintenance  Topic Date Due  . COVID-19 Vaccine (1) Never done  . MAMMOGRAM  08/13/2017  . INFLUENZA VACCINE  Never done  . TETANUS/TDAP  06/12/2022  . COLONOSCOPY  09/01/2023  . Hepatitis C Screening  Completed  . HIV Screening  Completed    Discussed health benefits of physical activity, and encouraged her to engage in regular exercise appropriate for her age and condition.  1. Annual physical exam General health stable. S/P hysterectomy. Will schedule for mammograms. Discussing COVID vaccinations and influenza vaccination with pulmonologist (Dr. Meredeth Ide) before getting them. Given anticipatory counseling. Colonoscopy normal in 2014. Recheck labs. - CBC with Differential/Platelet - Comprehensive metabolic panel - Lipid Panel With LDL/HDL Ratio - TSH  2. Essential (primary) hypertension Well controlled BP. Continues Coreg 25 mg BID and Amlodipine 5 mg qd. No chest pains, dyspnea,  edema or palpitations. Recheck labs. - CBC with Differential/Platelet - Comprehensive metabolic panel - Lipid Panel With LDL/HDL Ratio - TSH  3. Adult hypothyroidism Been on Levothyroxine 125 mcg qd for the past "30" years. Recheck labs. - CBC with Differential/Platelet - TSH - T4  4. Current use of long term anticoagulation On Coumadin 2.5 mg 2 tablets daily and protime followed by Dr. Darrold Junker (cardiology) once a month.  5. Mixed  hyperlipidemia No statins presently. Follows a low fat DASH diet. Recheck labs. - Comprehensive metabolic panel - Lipid Panel With LDL/HDL Ratio - TSH  6. Mechanical heart valve present History of aortic dissection with repair and aortic valve replacement with a St. Jude mechanical valve in 2010 and 2017. Followed by Dr. Zebedee Iba ([facility]) annually. Recheck labs. - CBC with Differential/Platelet - Comprehensive metabolic panel - Lipid Panel With LDL/HDL Ratio  7. Avitaminosis D History of low Vitamin D level. Recheck levels. - CBC with Differential/Platelet - VITAMIN D 25 Hydroxy (Vit-D Deficiency, Fractures)  8. Screening for osteoporosis - DG Bone Density  9. Screening mammogram for breast cancer - MM Digital Screening  10. COPD with acute exacerbation (HCC) Developed a cough with some greenish sputum production and slightly stopped up sensation. No sneezing, loss of taste/smell, fatigue, chills, fever or body aches. Still using Combivent, and  Symbicort. Occasionally uses Duoneb by nebulized prn wheezing. Will add Z-pak for bronchitis flare. May add Mucinex-DM prn cough. Increase fluid intake and recheck with Dr. Meredeth Ide (pulmonologist) as planned. - azithromycin (ZITHROMAX) 250 MG tablet; Take 2 tablets today by mouth then 1 daily for 4 days.  Dispense: 6 tablet; Refill: 0   No follow-ups on file.     Haywood Pao, PA, have reviewed all documentation for this visit. The documentation on 09/18/20 for the exam, diagnosis, procedures, and orders are all accurate and complete.    Dortha Kern, PA  [facility] [phone] (phone) [phone] (fax)  [facility]

## 2020-09-30 LAB — CBC WITH DIFFERENTIAL/PLATELET
Basophils Absolute: 0.1 10*3/uL (ref 0.0–0.2)
Basos: 1 %
EOS (ABSOLUTE): 0.2 10*3/uL (ref 0.0–0.4)
Eos: 3 %
Hematocrit: 41 % (ref 34.0–46.6)
Hemoglobin: 14.2 g/dL (ref 11.1–15.9)
Immature Grans (Abs): 0 10*3/uL (ref 0.0–0.1)
Immature Granulocytes: 0 %
Lymphocytes Absolute: 1.5 10*3/uL (ref 0.7–3.1)
Lymphs: 22 %
MCH: 31.4 pg (ref 26.6–33.0)
MCHC: 34.6 g/dL (ref 31.5–35.7)
MCV: 91 fL (ref 79–97)
Monocytes Absolute: 0.6 10*3/uL (ref 0.1–0.9)
Monocytes: 8 %
Neutrophils Absolute: 4.6 10*3/uL (ref 1.4–7.0)
Neutrophils: 66 %
Platelets: 232 10*3/uL (ref 150–450)
RBC: 4.52 x10E6/uL (ref 3.77–5.28)
RDW: 12.5 % (ref 11.7–15.4)
WBC: 7 10*3/uL (ref 3.4–10.8)

## 2020-09-30 LAB — COMPREHENSIVE METABOLIC PANEL
ALT: 12 IU/L (ref 0–32)
AST: 18 IU/L (ref 0–40)
Albumin/Globulin Ratio: 1.7 (ref 1.2–2.2)
Albumin: 4.5 g/dL (ref 3.8–4.9)
Alkaline Phosphatase: 122 IU/L — ABNORMAL HIGH (ref 44–121)
BUN/Creatinine Ratio: 12 (ref 9–23)
BUN: 9 mg/dL (ref 6–24)
Bilirubin Total: 0.7 mg/dL (ref 0.0–1.2)
CO2: 23 mmol/L (ref 20–29)
Calcium: 8.9 mg/dL (ref 8.7–10.2)
Chloride: 100 mmol/L (ref 96–106)
Creatinine, Ser: 0.75 mg/dL (ref 0.57–1.00)
GFR calc Af Amer: 102 mL/min/{1.73_m2} (ref >59)
GFR calc non Af Amer: 89 mL/min/{1.73_m2} (ref >59)
Globulin, Total: 2.7 g/dL (ref 1.5–4.5)
Glucose: 105 mg/dL — ABNORMAL HIGH (ref 65–99)
Potassium: 3.6 mmol/L (ref 3.5–5.2)
Sodium: 138 mmol/L (ref 134–144)
Total Protein: 7.2 g/dL (ref 6.0–8.5)

## 2020-09-30 LAB — LIPID PANEL WITH LDL/HDL RATIO
Cholesterol, Total: 167 mg/dL (ref 100–199)
HDL: 45 mg/dL (ref >39)
LDL Chol Calc (NIH): 101 mg/dL — ABNORMAL HIGH (ref 0–99)
LDL/HDL Ratio: 2.2 ratio (ref 0.0–3.2)
Triglycerides: 116 mg/dL (ref 0–149)
VLDL Cholesterol Cal: 21 mg/dL (ref 5–40)

## 2020-09-30 LAB — T4: T4, Total: 6.5 ug/dL (ref 4.5–12.0)

## 2020-09-30 LAB — TSH: TSH: 3.43 u[IU]/mL (ref 0.450–4.500)

## 2020-09-30 LAB — VITAMIN D 25 HYDROXY (VIT D DEFICIENCY, FRACTURES): Vit D, 25-Hydroxy: 22 ng/mL — ABNORMAL LOW (ref 30.0–100.0)

## 2020-10-30 ENCOUNTER — Ambulatory Visit
Admission: RE | Admit: 2020-10-30 | Discharge: 2020-10-30 | Disposition: A | Discharge status: Home / Self Care | Payer: Managed Care, Other (non HMO) | Source: Ambulatory Visit | Attending: Family Medicine | Admitting: Family Medicine

## 2020-10-30 ENCOUNTER — Other Ambulatory Visit: Payer: Self-pay

## 2020-10-30 DIAGNOSIS — Z1231 Encounter for screening mammogram for malignant neoplasm of breast: Secondary | ICD-10-CM | POA: Insufficient documentation

## 2020-11-02 ENCOUNTER — Other Ambulatory Visit: Payer: Self-pay | Admitting: Family Medicine

## 2020-11-02 DIAGNOSIS — R928 Other abnormal and inconclusive findings on diagnostic imaging of breast: Secondary | ICD-10-CM

## 2020-11-02 DIAGNOSIS — N6310 Unspecified lump in the right breast, unspecified quadrant: Secondary | ICD-10-CM

## 2020-11-07 ENCOUNTER — Ambulatory Visit
Admission: RE | Admit: 2020-11-07 | Discharge: 2020-11-07 | Disposition: A | Discharge status: Home / Self Care | Payer: 59 | Source: Ambulatory Visit | Attending: Family Medicine | Admitting: Family Medicine

## 2020-11-07 ENCOUNTER — Other Ambulatory Visit: Payer: Self-pay | Admitting: Family Medicine

## 2020-11-07 ENCOUNTER — Other Ambulatory Visit: Payer: Self-pay

## 2020-11-07 DIAGNOSIS — N6310 Unspecified lump in the right breast, unspecified quadrant: Secondary | ICD-10-CM

## 2020-11-07 DIAGNOSIS — R928 Other abnormal and inconclusive findings on diagnostic imaging of breast: Secondary | ICD-10-CM | POA: Diagnosis present

## 2020-11-11 HISTORY — PX: BREAST BIOPSY: SHX20

## 2020-11-21 ENCOUNTER — Ambulatory Visit
Admission: RE | Admit: 2020-11-21 | Discharge: 2020-11-21 | Disposition: A | Discharge status: Home / Self Care | Payer: 59 | Source: Ambulatory Visit | Attending: Family Medicine | Admitting: Family Medicine

## 2020-11-21 ENCOUNTER — Other Ambulatory Visit: Payer: Self-pay

## 2020-11-21 DIAGNOSIS — R928 Other abnormal and inconclusive findings on diagnostic imaging of breast: Secondary | ICD-10-CM

## 2020-11-21 DIAGNOSIS — N6310 Unspecified lump in the right breast, unspecified quadrant: Secondary | ICD-10-CM

## 2020-11-22 LAB — SURGICAL PATHOLOGY

## 2020-12-08 ENCOUNTER — Emergency Department: Payer: 59

## 2020-12-08 ENCOUNTER — Other Ambulatory Visit: Payer: Self-pay

## 2020-12-08 ENCOUNTER — Encounter: Payer: Self-pay | Admitting: Emergency Medicine

## 2020-12-08 ENCOUNTER — Emergency Department
Admission: EM | Admit: 2020-12-08 | Discharge: 2020-12-09 | Disposition: A | Discharge status: Home / Self Care | Payer: 59 | Attending: Emergency Medicine | Admitting: Emergency Medicine

## 2020-12-08 DIAGNOSIS — Y92009 Unspecified place in unspecified non-institutional (private) residence as the place of occurrence of the external cause: Secondary | ICD-10-CM | POA: Diagnosis not present

## 2020-12-08 DIAGNOSIS — F10129 Alcohol abuse with intoxication, unspecified: Secondary | ICD-10-CM | POA: Insufficient documentation

## 2020-12-08 DIAGNOSIS — S99911A Unspecified injury of right ankle, initial encounter: Secondary | ICD-10-CM | POA: Diagnosis present

## 2020-12-08 DIAGNOSIS — I10 Essential (primary) hypertension: Secondary | ICD-10-CM | POA: Insufficient documentation

## 2020-12-08 DIAGNOSIS — F10929 Alcohol use, unspecified with intoxication, unspecified: Secondary | ICD-10-CM

## 2020-12-08 DIAGNOSIS — J449 Chronic obstructive pulmonary disease, unspecified: Secondary | ICD-10-CM | POA: Insufficient documentation

## 2020-12-08 DIAGNOSIS — Z87891 Personal history of nicotine dependence: Secondary | ICD-10-CM | POA: Insufficient documentation

## 2020-12-08 DIAGNOSIS — Z7901 Long term (current) use of anticoagulants: Secondary | ICD-10-CM | POA: Insufficient documentation

## 2020-12-08 DIAGNOSIS — S82851A Displaced trimalleolar fracture of right lower leg, initial encounter for closed fracture: Secondary | ICD-10-CM

## 2020-12-08 DIAGNOSIS — Z79899 Other long term (current) drug therapy: Secondary | ICD-10-CM | POA: Insufficient documentation

## 2020-12-08 DIAGNOSIS — W010XXA Fall on same level from slipping, tripping and stumbling without subsequent striking against object, initial encounter: Secondary | ICD-10-CM | POA: Diagnosis not present

## 2020-12-08 DIAGNOSIS — Z20822 Contact with and (suspected) exposure to covid-19: Secondary | ICD-10-CM | POA: Diagnosis not present

## 2020-12-08 NOTE — ED Notes (Signed)
Pt states has had 6-8 beers today. Pt states she drinks daily.

## 2020-12-08 NOTE — ED Triage Notes (Signed)
Pt presents to ER from home via EMS, pt reports she slipped and fell in her drive drive way denies hitting her head, noted obvious deformity to right ankle. Pt reports she had been drinking at home.Pt talks in complete sentences no respiratory distress noted

## 2020-12-09 ENCOUNTER — Emergency Department: Payer: 59

## 2020-12-09 LAB — BASIC METABOLIC PANEL
Anion gap: 12 (ref 5–15)
BUN: 10 mg/dL (ref 6–20)
CO2: 24 mmol/L (ref 22–32)
Calcium: 8.4 mg/dL — ABNORMAL LOW (ref 8.9–10.3)
Chloride: 94 mmol/L — ABNORMAL LOW (ref 98–111)
Creatinine, Ser: 0.65 mg/dL (ref 0.44–1.00)
GFR, Estimated: >60 mL/min (ref >60)
Glucose, Bld: 104 mg/dL — ABNORMAL HIGH (ref 70–99)
Potassium: 3.2 mmol/L — ABNORMAL LOW (ref 3.5–5.1)
Sodium: 130 mmol/L — ABNORMAL LOW (ref 135–145)

## 2020-12-09 LAB — CBC
HCT: 39.1 % (ref 36.0–46.0)
Hemoglobin: 13.5 g/dL (ref 12.0–15.0)
MCH: 31.8 pg (ref 26.0–34.0)
MCHC: 34.5 g/dL (ref 30.0–36.0)
MCV: 92.2 fL (ref 80.0–100.0)
Platelets: 233 10*3/uL (ref 150–400)
RBC: 4.24 MIL/uL (ref 3.87–5.11)
RDW: 12.2 % (ref 11.5–15.5)
WBC: 9 10*3/uL (ref 4.0–10.5)
nRBC: 0 % (ref 0.0–0.2)

## 2020-12-09 LAB — PROTIME-INR
INR: 1.9 — ABNORMAL HIGH (ref 0.8–1.2)
Prothrombin Time: 21.4 seconds — ABNORMAL HIGH (ref 11.4–15.2)

## 2020-12-09 LAB — SARS CORONAVIRUS 2 BY RT PCR (HOSPITAL ORDER, PERFORMED IN ~~LOC~~ HOSPITAL LAB): SARS Coronavirus 2: NEGATIVE

## 2020-12-09 LAB — ETHANOL: Alcohol, Ethyl (B): 235 mg/dL — ABNORMAL HIGH (ref <10)

## 2020-12-09 MED ORDER — FENTANYL CITRATE (PF) 100 MCG/2ML IJ SOLN
INTRAMUSCULAR | Status: AC
  Filled 2020-12-09: qty 2

## 2020-12-09 MED ORDER — NALOXONE HCL 2 MG/2ML IJ SOSY
PREFILLED_SYRINGE | INTRAMUSCULAR | Status: AC
  Filled 2020-12-09: qty 2

## 2020-12-09 MED ORDER — OXYCODONE-ACETAMINOPHEN 5-325 MG PO TABS: 1.0000 | ORAL_TABLET | ORAL | 0 refills | Status: DC | PRN

## 2020-12-09 MED ORDER — LIDOCAINE HCL (PF) 1 % IJ SOLN
INTRAMUSCULAR | Status: AC
  Filled 2020-12-09: qty 10

## 2020-12-09 MED ORDER — FENTANYL CITRATE (PF) 100 MCG/2ML IJ SOLN: 100.0000 ug | Freq: Once | INTRAMUSCULAR | Status: DC

## 2020-12-09 NOTE — ED Notes (Signed)
Orthopedist here to attempt further reduction of ankle.

## 2020-12-09 NOTE — Discharge Instructions (Addendum)
Elevate, apply ice. Keep splint dry and in place until cleared by Orthopedics.Follow up with Orthopedics in one week. Do not bear weight on your R foot. Take pain medication as presecribed  If your pain becomes severe, if your fingers become blue/purple or pale, or if you experience pins-and-needles sensation in your fingers and hand, remove the elastic wrap and reapply it looser.  Do not change the hard part of the splint.  If that resolves your symptoms it is okay to stay home and follow-up with orthopedics.  If you continue to have those symptoms please return to the emergency room immediately.

## 2020-12-09 NOTE — Consult Note (Signed)
ORTHOPAEDIC CONSULTATION  PATIENT NAME: Nancy [name] DOB: 1961/10/21  MRN: [id number]  REQUESTING PHYSICIAN: Nita Sickle, MD  Chief Complaint: R ankle pain  HPI: Nancy [name] is a 60 y.o. female who complains of  Right ankle pain after mechanical GLF. Pt did not hit head. She had gross deformity and immediate pain of R ankle. No significant PMH. ED tried reduction x2 that were unsuccessful. Consultation for R ankle trimal fx reduction.   Past Medical History:  Diagnosis Date  . Allergy   . Depression   . Hypertension   . Rosacea   . Sleep apnea   . Thyroid disease    Past Surgical History:  Procedure Laterality Date  . ABDOMINAL HYSTERECTOMY  2007  . AORTIC VALVE REPLACEMENT  06/2009  . APPENDECTOMY  1985  . BREAST CYST ASPIRATION     does not remember any details  . CARPAL TUNNEL RELEASE Left 04/12/2013  . CATARACT EXTRACTION Right 1994  . FOOT SURGERY Left 1997-1998   Social History   Socioeconomic History  . Marital status: Single    Spouse name: Not on file  . Number of children: 0  . Years of education: 35  . Highest education level: High school graduate  Occupational History    Employer: [employer]  Tobacco Use  . Smoking status: Former Smoker    Quit date: 03/11/2016    Years since quitting: 4.7  . Smokeless tobacco: Never Used  . Tobacco comment: patient states  that she is working at her own pace to quit smoking  Vaping Use  . Vaping Use: Former  Substance and Sexual Activity  . Alcohol use: Yes    Alcohol/week: 0.0 standard drinks    Comment: OCCASIONALLY  . Drug use: No  . Sexual activity: Not on file  Other Topics Concern  . Not on file  Social History Narrative  . Not on file   Social Determinants of Health   Financial Resource Strain: Not on file  Food Insecurity: Not on file  Transportation Needs: Not on file  Physical Activity: Not on file  Stress: Not on file  Social Connections: Not on file   Family History  Problem Relation  Age of Onset  . Pneumonia Mother   . Breast cancer Mother 105       twice 50's and 85  . Hypertension Father   . Heart disease Father   . Colon cancer Maternal Grandmother   . Heart disease Paternal Grandmother   . Healthy Sister   . Prostate cancer Neg Hx   . Kidney cancer Neg Hx    Allergies  Allergen Reactions  . Codeine Nausea Only    Can take cough syrup.   Prior to Admission medications   Medication Sig Start Date End Date Taking? Authorizing Provider  acetaminophen (TYLENOL) 500 MG tablet Take 2 tablets by mouth every 6 (six) hours as needed. 07/30/11   [provider]  albuterol-ipratropium (COMBIVENT) 18-103 MCG/ACT inhaler Inhale 2 puffs into the lungs every 6 (six) hours as needed.  07/30/11   [provider]  amLODipine (NORVASC) 5 MG tablet Take 1 tablet by mouth daily. 07/30/11   [provider]  azithromycin (ZITHROMAX) 250 MG tablet Take 2 tablets today by mouth then 1 daily for 4 days. 09/18/20   Chrismon, Jodell Cipro, PA-C  budesonide-formoterol (SYMBICORT) 160-4.5 MCG/ACT inhaler Inhale 2 puffs into the lungs daily.  06/01/14   [provider]  carvedilol (COREG) 25 MG tablet Take  1 tablet by mouth 2 (two) times daily. 07/30/11   [provider]  doxycycline (ORACEA) 40 MG capsule TAKE 1 CAPSULE BY MOUTH DAILY WITH FOOD 09/18/20   Deirdre Evener, MD  furosemide (LASIX) 40 MG tablet Take 1 tablet by mouth daily. 07/30/11   [provider]  levothyroxine (SYNTHROID) 125 MCG tablet Take 1 tablet (125 mcg total) by mouth daily before breakfast. Please schedule a physical before anymore refills. 08/29/20   Erasmo Downer, MD  metroNIDAZOLE (METROGEL) 0.75 % gel Apply 1 application topically 2 (two) times daily.    [provider]  warfarin (COUMADIN) 2.5 MG tablet Take 2 tablets by mouth as directed. 06/01/14   [provider]   DG Ankle Complete Right  Result Date: 12/09/2020 CLINICAL DATA:  Status  post reduction EXAM: RIGHT ANKLE - COMPLETE 3+ VIEW COMPARISON:  12/09/2020 FINDINGS: There is a splint around the left ankle. There is worsened alignment at the tibiotalar joint in the anterior-posterior direction following reduction. The degree of posterior displacement of the talus relative to the tibia has increased. Otherwise, alignment is unchanged from the earlier radiograph. IMPRESSION: Worsened alignment at the tibiotalar joint in the anterior-posterior direction following reduction. Electronically Signed   By: Deatra Robinson M.D.   On: 12/09/2020 02:10   DG Ankle Complete Right  Result Date: 12/09/2020 CLINICAL DATA:  Status post reduction EXAM: RIGHT ANKLE - COMPLETE 3+ VIEW COMPARISON:  Films from earlier in the same day. FINDINGS: Some reduction of the ankle fracture has been performed. There remains lateral displacement of the talus with respect to the distal tibia. Casting material is noted. Posterior malleolar fracture is seen as well. IMPRESSION: Trimalleolar fracture with some interval reduction although the talus remains somewhat laterally displaced with respect to the distal tibia. Electronically Signed   By: Alcide Clever M.D.   On: 12/09/2020 00:35   DG Ankle Complete Right  Result Date: 12/08/2020 CLINICAL DATA:  Fall, deformity and right ankle. EXAM: RIGHT ANKLE - COMPLETE 3+ VIEW COMPARISON:  X-ray right foot 10/14/2016. FINDINGS: Transverse laterally displaced transsyndesmotic distal fibular fracture. A superimposed fracture fragment on the cross-table lateral may be related to the lateral malleolar fracture or a posterior malleolar fracture. Likely posterior malleolar fracture poorly visualized on this study. Transverse comminuted and displaced distal medial malleolar fracture. Associated widening of the mediolateral clear spaces with tibiotalar dislocation. Associated subcutaneus soft tissue edema. A subcutaneus soft tissue emphysema retained radiopaque foreign body. IMPRESSION:  Tibiotalar dislocation with definite displaced and comminuted bi-malleolar - likely trimalleolar fracture. Electronically Signed   By: Tish Frederickson M.D.   On: 12/08/2020 23:51    Positive ROS: All other systems have been reviewed and were otherwise negative with the exception of those mentioned in the HPI and as above.  Physical Exam: General: Well developed, well nourished female seen in no acute distress. HEENT: Atraumatic and normocephalic. Sclera are clear. Extraocular motion is intact. Oropharynx is clear with moist mucosa. Neck: Supple, nontender, good range of motion. No JVD or carotid bruits. Lungs: Clear to auscultation bilaterally. Cardiovascular: Regular rate and rhythm with normal S1 and S2. No murmurs. No gallops or rubs. Pedal pulses are palpable bilaterally. Homans test is negative bilaterally. No significant pretibial or ankle edema. Abdomen: Soft, nontender, and nondistended. Bowel sounds are present. Skin: No lesions in the area of chief complaint Neurologic: Awake, alert, and oriented. Sensory function is grossly intact. Motor strength is felt to be 5 over 5 bilaterally. No clonus or tremor.  Good motor coordination. Lymphatic: No axillary or cervical lymphadenopathy  MUSCULOSKELETAL: RLE: Skin intact, Deformity, ROM/stability not tested secondary to known fx. NVI>   Assessment: R ankle closed, displaced trimalleolar fx s/p reduciton  Plan: Pt was reduced and splinted.  Tolerated the procedure well. Post reductions show concentric reduction of tibiotalar joint.   -Pt is NWB and should follow up with Dr. Odis Luster for surgical scheduling on an outpatient basis.   Mena Goes M.D.

## 2020-12-09 NOTE — ED Notes (Signed)
Pt sleeping, cap refill to right great toe less than 2 seconds.

## 2020-12-09 NOTE — ED Provider Notes (Signed)
[facility] Emergency Department Provider Note  ____________________________________________  Time seen: Approximately 12:04 AM  I have reviewed the triage vital signs and the nursing notes.   HISTORY  Chief Complaint Ankle Pain and Fall   HPI Nancy [name] is a 60 y.o. female who presents for fall and R ankle pain. Patient reports 6-8 beers tonight. Slipped and twisted her ankle at home. No head trauma or LOC.  Patient with obvious deformity to the right ankle.  Complaining of 4 out of 10 pain which is worse with any manipulation.  No paresthesias or numbness.  Denies any headache, neck pain, back pain, hip pain, or any other extremity pain.  Patient denies any other drug use.   Past Medical History:  Diagnosis Date  . Allergy   . Depression   . Hypertension   . Rosacea   . Sleep apnea   . Thyroid disease     Patient Active Problem List   Diagnosis Date Noted  . Hyperlipidemia 12/13/2019  . S/P complete hysterectomy 01/29/2018  . Urinary retention 01/02/2018  . Pain in right foot 10/14/2016  . Allergic rhinitis 09/18/2015  . 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 Anxiety 05/09/2015  . CAFL (chronic airflow limitation) (HCC) 05/09/2015  . Clinical depression 05/09/2015  . Abnormal LFTs 05/09/2015  . Hydronephrosis 05/09/2015  . Adult hypothyroidism 05/09/2015  . AI (aortic incompetence) 05/09/2015  . D-dimer, elevated 05/09/2015  . 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 Avitaminosis D 05/09/2015  . Essential (primary) hypertension 05/09/2015  . Aortic valve defect 05/09/2015  . Chronic obstructive pulmonary disease (HCC) 05/09/2015  . History of open heart surgery 11/22/2013  . 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 Aortic dissection (HCC) 07/03/2011    Past Surgical History:  Procedure Laterality Date  . ABDOMINAL HYSTERECTOMY  2007  . AORTIC VALVE REPLACEMENT  06/2009  .  APPENDECTOMY  1985  . BREAST CYST ASPIRATION     does not remember any details  . CARPAL TUNNEL RELEASE Left 04/12/2013  . CATARACT EXTRACTION Right 1994  . FOOT SURGERY Left 1997-1998    Prior to Admission medications   Medication Sig Start Date End Date Taking? Authorizing Provider  oxyCODONE-acetaminophen (PERCOCET) 5-325 MG tablet Take 1 tablet by mouth every 4 (four) hours as needed. 12/09/20  Yes Don Perking, [place], MD  albuterol-ipratropium [facility]) 18-103 MCG/ACT inhaler Inhale 2 puffs into the lungs every 6 (six) hours as needed.  07/30/11   [provider]  amLODipine (NORVASC) 5 MG tablet Take 1 tablet by mouth daily. 07/30/11   [provider]  azithromycin (ZITHROMAX) 250 MG tablet Take 2 tablets today by mouth then 1 daily for 4 days. 09/18/20   Chrismon, Jodell Cipro, PA-C  budesonide-formoterol (SYMBICORT) 160-4.5 MCG/ACT inhaler Inhale 2 puffs into the lungs daily.  06/01/14   [provider]  carvedilol (COREG) 25 MG tablet Take 1 tablet by mouth 2 (two) times daily. 07/30/11   [provider]  doxycycline (ORACEA) 40 MG capsule TAKE 1 CAPSULE BY MOUTH DAILY WITH FOOD 09/18/20   Deirdre Evener, MD  furosemide (LASIX) 40 MG tablet Take 1 tablet by mouth daily. 07/30/11   [provider]  levothyroxine (SYNTHROID) 125 MCG tablet Take 1 tablet (125 mcg total) by mouth daily before breakfast. Please schedule a physical before anymore refills. 08/29/20   Erasmo Downer, MD  metroNIDAZOLE (METROGEL) 0.75 % gel Apply 1 application topically 2 (two)  times daily.    [provider]  warfarin (COUMADIN) 2.5 MG tablet Take 2 tablets by mouth as directed. 06/01/14   [provider]    Allergies Codeine  Family History  Problem Relation Age of Onset  . Pneumonia Mother   . Breast cancer Mother 49       twice 50's and 34  . Hypertension Father   . Heart disease Father   . Colon cancer Maternal Grandmother   . Heart  disease Paternal Grandmother   . Healthy Sister   . Prostate cancer Neg Hx   . Kidney cancer Neg Hx     Social History Social History   Tobacco Use  . Smoking status: Former Smoker    Quit date: 03/11/2016    Years since quitting: 4.7  . Smokeless tobacco: Never Used  . Tobacco comment: patient states  that she is working at her own pace to quit smoking  Vaping Use  . Vaping Use: Former  Substance Use Topics  . Alcohol use: Yes    Alcohol/week: 0.0 standard drinks    Comment: OCCASIONALLY  . Drug use: No    Review of Systems  Constitutional: Negative for fever. Eyes: Negative for visual changes. ENT: Negative for sore throat. Neck: No neck pain  Cardiovascular: Negative for chest pain. Respiratory: Negative for shortness of breath. Gastrointestinal: Negative for abdominal pain, vomiting or diarrhea. Genitourinary: Negative for dysuria. Musculoskeletal: Negative for back pain. + R ankle pain Skin: Negative for rash. Neurological: Negative for headaches, weakness or numbness. Psych: No SI or HI  ____________________________________________   PHYSICAL EXAM:  VITAL SIGNS: ED Triage Vitals  Enc Vitals Group     BP 12/08/20 2324 137/81     Pulse Rate 12/08/20 2324 82     Resp 12/08/20 2324 20     Temp 12/08/20 2324 98.2 F (36.8 C)     Temp Source 12/08/20 2324 Oral     SpO2 12/08/20 2324 93 %     Weight 12/08/20 2325 200 lb (90.7 kg)     Height 12/08/20 2325 5\' 7"  (1.702 m)     Head Circumference --      Peak Flow --      Pain Score 12/08/20 2325 9     Pain Loc --      Pain Edu? --      Excl. in GC? --     Constitutional: Alert and oriented, intoxicated.  HEENT:      Head: Normocephalic and atraumatic.         Eyes: Conjunctivae are normal. Sclera is non-icteric.       Mouth/Throat: Mucous membranes are moist.       Neck: No C-spine tenderness Cardiovascular: Regular rate and rhythm. No murmurs, gallops, or rubs. 2+ symmetrical distal pulses are present  in all extremities.  Respiratory: Normal respiratory effort. Lungs are clear to auscultation bilaterally.  Gastrointestinal: Soft, non tender. Musculoskeletal: Swelling with obvious deformity of the right ankle, no lacerations, 2+ PT and DP pulses, brisk capillary refill distally with normal sensation Neurologic: Slurred speech. Face is symmetric. Moving all extremities. No gross focal neurologic deficits are appreciated. Skin: Skin is warm, dry and intact. No rash noted. Psychiatric: Mood and affect are normal. Speech and behavior are normal.  ____________________________________________   LABS (all labs ordered are listed, but only abnormal results are displayed)  Labs Reviewed  ETHANOL - Abnormal; Notable for the following components:      Result Value   Alcohol,  Ethyl (B) 235 (*)    All other components within normal limits  BASIC METABOLIC PANEL - Abnormal; Notable for the following components:   Sodium 130 (*)    Potassium 3.2 (*)    Chloride 94 (*)    Glucose, Bld 104 (*)    Calcium 8.4 (*)    All other components within normal limits  PROTIME-INR - Abnormal; Notable for the following components:   Prothrombin Time 21.4 (*)    INR 1.9 (*)    All other components within normal limits  SARS CORONAVIRUS 2 BY RT PCR (HOSPITAL ORDER, PERFORMED IN [facility] [place] [facility])  CBC   ____________________________________________  EKG  none  ____________________________________________  RADIOLOGY  I have personally reviewed the images performed during this visit and I agree with the Radiologist's read.   Interpretation by Radiologist:  DG Ankle Complete Right  Result Date: 12/09/2020 CLINICAL DATA:  Status post reduction EXAM: RIGHT ANKLE - COMPLETE 3+ VIEW COMPARISON:  12/09/2020 FINDINGS: There is a splint around the left ankle. There is worsened alignment at the tibiotalar joint in the anterior-posterior direction following reduction. The degree of posterior displacement  of the talus relative to the tibia has increased. Otherwise, alignment is unchanged from the earlier radiograph. IMPRESSION: Worsened alignment at the tibiotalar joint in the anterior-posterior direction following reduction. Electronically Signed   By: Deatra Robinson M.D.   On: 12/09/2020 02:10   DG Ankle Complete Right  Result Date: 12/09/2020 CLINICAL DATA:  Status post reduction EXAM: RIGHT ANKLE - COMPLETE 3+ VIEW COMPARISON:  Films from earlier in the same day. FINDINGS: Some reduction of the ankle fracture has been performed. There remains lateral displacement of the talus with respect to the distal tibia. Casting material is noted. Posterior malleolar fracture is seen as well. IMPRESSION: Trimalleolar fracture with some interval reduction although the talus remains somewhat laterally displaced with respect to the distal tibia. Electronically Signed   By: Alcide Clever M.D.   On: 12/09/2020 00:35   DG Ankle Complete Right  Result Date: 12/08/2020 CLINICAL DATA:  Fall, deformity and right ankle. EXAM: RIGHT ANKLE - COMPLETE 3+ VIEW COMPARISON:  X-ray right foot 10/14/2016. FINDINGS: Transverse laterally displaced transsyndesmotic distal fibular fracture. A superimposed fracture fragment on the cross-table lateral may be related to the lateral malleolar fracture or a posterior malleolar fracture. Likely posterior malleolar fracture poorly visualized on this study. Transverse comminuted and displaced distal medial malleolar fracture. Associated widening of the mediolateral clear spaces with tibiotalar dislocation. Associated subcutaneus soft tissue edema. A subcutaneus soft tissue emphysema retained radiopaque foreign body. IMPRESSION: Tibiotalar dislocation with definite displaced and comminuted bi-malleolar - likely trimalleolar fracture. Electronically Signed   By: Tish Frederickson M.D.   On: 12/08/2020 23:51     ____________________________________________   PROCEDURES  Procedure(s)  performed:yes .1-3 Lead EKG Interpretation Performed by: Nita Sickle, MD Authorized by: Nita Sickle, MD     Interpretation: non-specific     ECG rate assessment: normal     Rhythm: sinus rhythm     Ectopy: none   .Ortho Injury Treatment  Date/Time: 12/09/2020 12:25 AM Performed by: Nita Sickle, MD Authorized by: Nita Sickle, MD   Consent:    Consent obtained:  Verbal   Consent given by:  Patient   Risks discussed:  Fracture, nerve damage, restricted joint movement, vascular damage, stiffness, recurrent dislocation and irreducible dislocation   Alternatives discussed:  Immobilization and referralInjury location: ankle Location details: right ankle Injury type: fracture-dislocation Fracture type: trimalleolar Pre-procedure  neurovascular assessment: neurovascularly intact Pre-procedure distal perfusion: normal Pre-procedure neurological function: normal Pre-procedure range of motion: reduced  Anesthesia: Local anesthesia used: no  Patient sedated: NoManipulation performed: yes Skeletal traction used: yes Reduction successful: yes X-ray confirmed reduction: yes Immobilization: splint Splint type: ankle stirrup Splint Applied by: ED Provider Supplies used: cotton padding,  Ortho-Glass and elastic bandage Post-procedure neurovascular assessment: post-procedure neurovascularly intact Post-procedure distal perfusion: normal Post-procedure neurological function: normal Post-procedure range of motion: improved    Critical Care performed:  None ____________________________________________   INITIAL IMPRESSION / ASSESSMENT AND PLAN / ED COURSE  60 y.o. female who presents for fall and R ankle pain.  Patient arrives extremely intoxicated therefore not a candidate for conscious sedation.  2 attempts at reduction were done by me with improvement of the dislocation but not fully reduced due to joint instability. I discussed with Orthopedics Dr. Franco Collet who  evaluated the XRs and recommended better reduction. He kindly came into the ED and performed patient's final reduction and immobilization. Final XR reviewed by him and appropriate for outpatient follow up. Patient was discharged to the care of her sister with her home walker.  Discussed nonweightbearing, follow-up with orthopedics for surgical repair, pain control at home and signs of compartment syndrome and recommended return if those develop.  Compartments are very soft upon discharge      _____________________________________________ Please note:  Patient was evaluated in Emergency Department today for the symptoms described in the history of present illness. Patient was evaluated in the context of the global COVID-19 pandemic, which necessitated consideration that the patient might be at risk for infection with the SARS-CoV-2 virus that causes COVID-19. Institutional protocols and algorithms that pertain to the evaluation of patients at risk for COVID-19 are in a state of rapid change based on information released by regulatory bodies including the CDC and federal and state organizations. These policies and algorithms were followed during the patient's care in the ED.  Some ED evaluations and interventions may be delayed as a result of limited staffing during the pandemic.   [place] Controlled Substance Database was reviewed by me. ____________________________________________   FINAL CLINICAL IMPRESSION(S) / ED DIAGNOSES   Final diagnoses:  Closed trimalleolar fracture of right ankle, initial encounter  Alcoholic intoxication with complication (HCC)      NEW MEDICATIONS STARTED DURING THIS VISIT:  ED Discharge Orders         Ordered    oxyCODONE-acetaminophen (PERCOCET) 5-325 MG tablet  Every 4 hours PRN        12/09/20 0414           Note:  This document was prepared using Dragon voice recognition software and may include unintentional dictation errors.    Don Perking, [place],  MD 12/09/20 [phone]

## 2020-12-09 NOTE — ED Notes (Signed)
Pt cleansed of stool, while turning and cleansing pt, MD reduced ankle at bedside.

## 2020-12-14 ENCOUNTER — Encounter (HOSPITAL_COMMUNITY): Payer: Self-pay | Admitting: Orthopaedic Surgery

## 2020-12-14 ENCOUNTER — Ambulatory Visit (INDEPENDENT_AMBULATORY_CARE_PROVIDER_SITE_OTHER): Payer: 59 | Admitting: Orthopaedic Surgery

## 2020-12-14 ENCOUNTER — Other Ambulatory Visit: Payer: Self-pay | Admitting: Physician Assistant

## 2020-12-14 ENCOUNTER — Encounter: Payer: Self-pay | Admitting: Orthopaedic Surgery

## 2020-12-14 ENCOUNTER — Other Ambulatory Visit: Payer: Self-pay

## 2020-12-14 DIAGNOSIS — S82851A Displaced trimalleolar fracture of right lower leg, initial encounter for closed fracture: Secondary | ICD-10-CM | POA: Insufficient documentation

## 2020-12-14 NOTE — Progress Notes (Signed)
COVID Vaccine Completed: No Date COVID Vaccine completed:N/A COVID vaccine manufacturer:  N/A Date of COVID positive in last 90 days: N/A  PCP - Tamsen Roers, PA-C last office visit 09/18/20 in epic Cardiologist - Dr. Marcina Millard [facility]  Chest x-ray - 12/23/19  in care everywhere EKG - 12/15/20 DOS Stress Test - greater than 2 years  ECHO - 01/17/20 in care everywhere Cardiac Cath - N/A Pacemaker/ICD device last checked:N/A  Sleep Study -  Yes  CPAP - Does not use CPAP  Fasting Blood Sugar - N/A Checks Blood Sugar __N/A___ times a day  Blood Thinner Instructions: Coumadin will remain on for surgery per Dr. Magnus Ivan. Aspirin Instructions: N/A Last Dose:N/A  Activity level: Can go up a flight of stairs without stopping and without symptoms      Anesthesia review: History AVR due to Aortic dissection, COPD  Patient denies shortness of breath, fever, cough and chest pain at PAT appointment   Patient verbalized understanding of instructions that were given to them at the PAT appointment. Patient was also instructed that they will need to review over the PAT instructions again at home before surgery.

## 2020-12-14 NOTE — Anesthesia Preprocedure Evaluation (Addendum)
Anesthesia Evaluation  Patient identified by MRN, date of birth, ID band Patient awake    Reviewed: Allergy & Precautions, NPO status , Patient's Chart, lab work & pertinent test results, reviewed documented beta blocker date and time   History of Anesthesia Complications (+) PONV  Airway Mallampati: I       Dental no notable dental hx.    Pulmonary sleep apnea , former smoker,    Pulmonary exam normal        Cardiovascular hypertension, Pt. on medications and Pt. on home beta blockers Normal cardiovascular exam+ Valvular Problems/Murmurs   S/P AVR for dissection.   Neuro/Psych PSYCHIATRIC DISORDERS Anxiety Depression    GI/Hepatic negative GI ROS, Neg liver ROS,   Endo/Other  Hypothyroidism   Renal/GU   negative genitourinary   Musculoskeletal   Abdominal Normal abdominal exam  (+)   Peds  Hematology  (+) anemia ,   Anesthesia Other Findings   Reproductive/Obstetrics                           Anesthesia Physical Anesthesia Plan  ASA: II  Anesthesia Plan: General   Post-op Pain Management:    Induction:   PONV Risk Score and Plan: 4 or greater and Ondansetron, Dexamethasone and Midazolam  Airway Management Planned: LMA  Additional Equipment: None  Intra-op Plan:   Post-operative Plan: Extubation in OR  Informed Consent: I have reviewed the patients History and Physical, chart, labs and discussed the procedure including the risks, benefits and alternatives for the proposed anesthesia with the patient or authorized representative who has indicated his/her understanding and acceptance.       Plan Discussed with: CRNA  Anesthesia Plan Comments: (See PAT note 12/14/20, Jodell Cipro, PA-C)       Anesthesia Quick Evaluation

## 2020-12-14 NOTE — Progress Notes (Addendum)
Anesthesia Chart Review   Case: [id number] Date/Time: 12/15/20 1029   Procedure: OPEN REDUCTION INTERNAL FIXATION (ORIF) RIGHT TRIMALLEOLAR ANKLE FRACTURE (Right Ankle)   Anesthesia type: General   Pre-op diagnosis: right trimalleolar ankle fracture   Location: [facility] ROOM 10 / [facility]   Surgeons: [name], MD      DISCUSSION:60 y.o. former smoker with h/o HTN, sleep apnea with CPAP, COPD, s/p Type A aortic dissection with repair and [employer] AVR 07/02/2009, right trimalleolar ankle fracture scheduled for above procedure 12/15/20 with Dr. Doneen Poisson.   Pt last seen by pulmonology 08/08/2020.  Pt last seen by cardiology 01/24/2020. Per OV note, "60 year old female with history of bicuspid aortic valve, Type A aortic dissection, ascending aortic repair and aortic valve replacement with persistent pseudoaneurysm, underwent successful repair on 03/19/2016. CT scan 01/17/2020 appears stable. 2D echocardiogram 01/17/2020 revealed normal left ventricular function, with stable appearing mechanical aortic valve prosthesis. Patient has essential hypertension, systolic pressure well controlled on current BP medications. The patient has chronic exertional dyspnea due to underlying COPD, and history of sleep apnea on CPAP, followed by Dr. Meredeth Ide."  Pt on Coumadin.  Per Dr. Magnus Ivan she does not need to hold prior to procedure.   Discussed with Dr. Stephannie Peters. Anticipate pt can proceed with planned procedure barring acute status change.   VS: There were no vitals taken for this visit.  PROVIDERS: Chrismon, Jodell Cipro, PA-C is PCP   Ned Clines, MD is Pulmonologist   Marcina Millard, MD is Cardiologist  LABS: Labs DOS (all labs ordered are listed, but only abnormal results are displayed)  Labs Reviewed - No data to display   IMAGES: CT Angiogram 01/17/2020 IMPRESSION:   1. Post surgical change status post graft repair of ascending aortic  aneurysm and aortic valve replacement with  interval increase in small  pseudoaneurysm at thedistal graft anastomosis.   2. Stable chronic aortic dissection extending from the aortic arch to the  bilateral iliacs with dissection flap extension into the left common  carotid, bilateral renal arteries, and left external and internal iliacs.   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   4. 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   5. Stable severe right hydronephrosis with cortical thinning.    EKG:   CV: Echo 01/17/2020 INTERPRETATION ---------------------------------------------------------------  NORMAL LEFT VENTRICULAR SYSTOLIC FUNCTION  NORMAL LA PRESSURES WITH NORMAL DIASTOLIC FUNCTION  NORMAL RIGHT VENTRICULAR SYSTOLIC FUNCTION  VALVULAR REGURGITATION: TRIVIAL MR, TRIVIAL TR  PROSTHETIC VALVE(S): MECH PROSTHETIC AoV  HX BICUSPID AOV WITH TYPE A AORTIC DISSECTION S/P REPAIR AND ST[employer] JUDE  MECHANICAL AVR 06/2009   Compared with prior Echo study on 01/11/2019: NO SIGNIFICANT CHANGE.   Past Medical History:  Diagnosis Date  . Allergy   . Depression   . Hypertension   . Rosacea   . Sleep apnea   . Thyroid disease     Past Surgical History:  Procedure Laterality Date  . ABDOMINAL HYSTERECTOMY  2007  . AORTIC VALVE REPLACEMENT  06/2009  . APPENDECTOMY  1985  . BREAST CYST ASPIRATION     does not remember any details  . CARPAL TUNNEL RELEASE Left 04/12/2013  . CATARACT EXTRACTION Right 1994  . FOOT SURGERY Left 1997-1998    MEDICATIONS: . ipratropium-albuterol (DUONEB) 0.5-2.5 (3) MG/3ML nebulizer solution 3 mL   . albuterol-ipratropium (COMBIVENT) 18-103 MCG/ACT inhaler  .  amLODipine (NORVASC) 5 MG tablet  . azithromycin (ZITHROMAX) 250 MG tablet  . budesonide-formoterol (SYMBICORT) 160-4.5 MCG/ACT inhaler  . carvedilol (COREG) 25 MG tablet  . doxycycline  (ORACEA) 40 MG capsule  . furosemide (LASIX) 40 MG tablet  . levothyroxine (SYNTHROID) 125 MCG tablet  . metroNIDAZOLE (METROGEL) 0.75 % gel  . oxyCODONE-acetaminophen (PERCOCET) 5-325 MG tablet  . warfarin (COUMADIN) 2.5 MG tablet    Jodell Cipro, PA-C [facility]Surgical Testing [phone]

## 2020-12-14 NOTE — H&P (Signed)
Nancy [name] is an 60 y.o. female.   Chief Complaint:   Right ankle pain with known fracture dislocation HPI: The patient is a 60 year old female who sustained a mechanical fall last weekend injuring her right ankle.  She was seen at [facility] in the emergency room and found to have a significant fracture dislocation of her right ankle.  This was reduced appropriately and splinted.  X-rays confirm a trimalleolar ankle fracture.  I explained to her that this is a significant unstable injury and surgery is recommended to stabilize the right ankle.  Past Medical History:  Diagnosis Date  . Aortic dissection (HCC) 2010  . COPD (chronic obstructive pulmonary disease) (HCC)   . Depression   . Hypertension   . Hypothyroidism   . Pneumonia   . PONV (postoperative nausea and vomiting)   . Rosacea   . Seasonal allergies   . Sleep apnea    Does not use CPAP    Past Surgical History:  Procedure Laterality Date  . ABDOMINAL HYSTERECTOMY  2007  . AORTIC VALVE REPLACEMENT  06/2009   repeated 2017  . APPENDECTOMY  1985  . BREAST BIOPSY Right 11/2020   benign  . BREAST CYST ASPIRATION     does not remember any details  . CARPAL TUNNEL RELEASE Left 04/12/2013   elbow  . CATARACT EXTRACTION Right 1994  . FOOT SURGERY Left 1997-1998    Family History  Problem Relation Age of Onset  . Pneumonia Mother   . Breast cancer Mother 28       twice 50's and 50  . Hypertension Father   . Heart disease Father   . Colon cancer Maternal Grandmother   . Heart disease Paternal Grandmother   . Healthy Sister   . Prostate cancer Neg Hx   . Kidney cancer Neg Hx    Social History:  reports that she quit smoking about 4 years ago. She has never used smokeless tobacco. She reports current alcohol use. She reports that she does not use drugs.  Allergies:  Allergies  Allergen Reactions  . Codeine Nausea Only    Can take cough syrup.    No medications prior to admission.    No  results found for this or any previous visit (from the past 48 hour(s)). No results found.  Review of Systems  All other systems reviewed and are negative.   Height 5\' 7"  (1.702 m), weight 86.2 kg. Physical Exam Vitals reviewed.  Constitutional:      Appearance: Normal appearance.  HENT:     Head: Normocephalic and atraumatic.  Cardiovascular:     Rate and Rhythm: Normal rate and regular rhythm.     Pulses: Normal pulses.  Pulmonary:     Effort: Pulmonary effort is normal.     Breath sounds: Normal breath sounds.  Abdominal:     Palpations: Abdomen is soft.  Musculoskeletal:     Cervical back: Normal range of motion and neck supple.     Right ankle: Deformity and ecchymosis present. Tenderness present over the lateral malleolus and medial malleolus. Decreased range of motion.  Neurological:     Mental Status: She is alert and oriented to person, place, and time.      Assessment/Plan Right trimalleolar ankle fracture dislocation  I explained in detail the recommendation for surgical intervention for this unstable ankle fracture dislocation.  The risk and benefits of surgery been explained in detail as well as the rationale behind proceeding with surgery for  this type of injury of the right ankle.  [name], MD 12/14/2020, 7:02 PM

## 2020-12-14 NOTE — Progress Notes (Signed)
Office Visit Note   Patient: Nancy [name]           Date of Birth: 1961-01-19           MRN: [id number] Visit Date: 12/14/2020              Requested by: Tamsen Roers, PA-C [address] [place],  [place] [address] PCP: Tamsen Roers, PA-C   Assessment & Plan: Visit Diagnoses:  1. Closed trimalleolar fracture of right ankle, initial encounter     Plan: I showed the patient and ankle model and her ankle x-rays and explained her the rationale behind her recommendation for surgery for this unstable right ankle fracture dislocation.  I talked about her interoperative and postoperative course and what to expect with this.  Although she is on Coumadin, she does not need to stop this or be bridged with Lovenox because of surgery needs to be done tomorrow since she is a week out from this unstable ankle injury.  This can be done under general anesthesia.  If anesthesia is comfortable with a popliteal block they will be fine as well.  I would certainly keep her overnight due to the need for postoperative antibiotics and physical therapy.  All questions and concerns were answered and addressed.  We will see about getting her on the operating room schedule for tomorrow.  We would then see her back in 2 weeks postoperative for suture removal in a repeat 3 views of the right ankle out of any dressings or splint.  Follow-Up Instructions: Return for 2 weeks post-op.   Orders:  No orders of the defined types were placed in this encounter.  No orders of the defined types were placed in this encounter.     Procedures: No procedures performed   Clinical Data: No additional findings.   Subjective: Chief Complaint  Patient presents with  . Right Ankle - Injury  The patient is a very pleasant 60 year old who injured her right ankle this past Saturday night after mechanical slip and fall with it was accidental.  She sustained a trimalleolar ankle fracture dislocation of the right  ankle.  She was seen at [facility].  It took 3 attempts to reduce her ankle but the ER MDs were able to reduce it and splinted appropriately.  She is not been seen in this office but is familiar with our office.  She fell for a follow-up appointment with [place].  She understands that this is an unstable ankle but does need surgery.  She does have a history of being on Coumadin due to mitral valve.  From my standpoint, she does not need to come off of Coumadin for surgery tomorrow on this ankle.  This would not be done under spinal anesthesia but I am fine with potentially nerve block.  I would certainly keep her overnight as well.  She is comfortable in the splint that she is in since the ankle is in better position.  She is taking only tramadol for pain.  She is not a diabetic.  HPI  Review of Systems She currently denies any headache, chest pain, shortness of breath, fever, chills, nausea, vomiting  Objective: Vital Signs: There were no vitals taken for this visit.  Physical Exam She is alert and oriented x3 and in no acute distress Ortho Exam Examination of her right ankle shows that the splint is fitting nice.  I did not take down the splint.  Her toes are well-perfused. Specialty  Comments:  No specialty comments available.  Imaging: No results found. Independent review of x-rays of the right ankle pre and post reduction show a trimalleolar ankle fracture that is unstable of the right ankle.  The posterior malleolus piece is a small less than 10% of the articular surface fragment.  PMFS History: Patient Active Problem List   Diagnosis Date Noted  . Closed trimalleolar fracture of right ankle 12/14/2020  . Hyperlipidemia 12/13/2019  . S/P complete hysterectomy 01/29/2018  . Urinary retention 01/02/2018  . Pain in right foot 10/14/2016  . Allergic rhinitis 09/18/2015  . 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 Anxiety 05/09/2015  . CAFL (chronic airflow  limitation) (HCC) 05/09/2015  . Clinical depression 05/09/2015  . Abnormal LFTs 05/09/2015  . Hydronephrosis 05/09/2015  . Adult hypothyroidism 05/09/2015  . AI (aortic incompetence) 05/09/2015  . D-dimer, elevated 05/09/2015  . 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 Avitaminosis D 05/09/2015  . Essential (primary) hypertension 05/09/2015  . Aortic valve defect 05/09/2015  . Chronic obstructive pulmonary disease (HCC) 05/09/2015  . History of open heart surgery 11/22/2013  . 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 Aortic dissection (HCC) 07/03/2011   Past Medical History:  Diagnosis Date  . Allergy   . Depression   . Hypertension   . Rosacea   . Sleep apnea   . Thyroid disease     Family History  Problem Relation Age of Onset  . Pneumonia Mother   . Breast cancer Mother 47       twice 50's and 76  . Hypertension Father   . Heart disease Father   . Colon cancer Maternal Grandmother   . Heart disease Paternal Grandmother   . Healthy Sister   . Prostate cancer Neg Hx   . Kidney cancer Neg Hx     Past Surgical History:  Procedure Laterality Date  . ABDOMINAL HYSTERECTOMY  2007  . AORTIC VALVE REPLACEMENT  06/2009  . APPENDECTOMY  1985  . BREAST CYST ASPIRATION     does not remember any details  . CARPAL TUNNEL RELEASE Left 04/12/2013  . CATARACT EXTRACTION Right 1994  . FOOT SURGERY Left 1997-1998   Social History   Occupational History    Employer: [employer]  Tobacco Use  . Smoking status: Former Smoker    Quit date: 03/11/2016    Years since quitting: 4.7  . Smokeless tobacco: Never Used  . Tobacco comment: patient states  that she is working at her own pace to quit smoking  Vaping Use  . Vaping Use: Former  Substance and Sexual Activity  . Alcohol use: Yes    Alcohol/week: 0.0 standard drinks    Comment: OCCASIONALLY  . Drug use: No  . Sexual activity: Not on file

## 2020-12-15 ENCOUNTER — Ambulatory Visit (HOSPITAL_COMMUNITY): Payer: 59 | Admitting: Certified Registered Nurse Anesthetist

## 2020-12-15 ENCOUNTER — Encounter (HOSPITAL_COMMUNITY)
Admission: RE | Disposition: A | Discharge status: Home / Self Care | Payer: Self-pay | Source: Home / Self Care | Attending: Orthopaedic Surgery

## 2020-12-15 ENCOUNTER — Encounter (HOSPITAL_COMMUNITY): Payer: Self-pay | Admitting: Orthopaedic Surgery

## 2020-12-15 ENCOUNTER — Observation Stay (HOSPITAL_COMMUNITY)
Admission: RE | Admit: 2020-12-15 | Discharge: 2020-12-16 | Disposition: A | Discharge status: Home / Self Care | Payer: 59 | Attending: Orthopaedic Surgery | Admitting: Orthopaedic Surgery

## 2020-12-15 ENCOUNTER — Ambulatory Visit (HOSPITAL_COMMUNITY): Payer: 59

## 2020-12-15 DIAGNOSIS — Z419 Encounter for procedure for purposes other than remedying health state, unspecified: Secondary | ICD-10-CM

## 2020-12-15 DIAGNOSIS — Z7901 Long term (current) use of anticoagulants: Secondary | ICD-10-CM | POA: Diagnosis not present

## 2020-12-15 DIAGNOSIS — I10 Essential (primary) hypertension: Secondary | ICD-10-CM | POA: Diagnosis not present

## 2020-12-15 DIAGNOSIS — J449 Chronic obstructive pulmonary disease, unspecified: Secondary | ICD-10-CM | POA: Diagnosis not present

## 2020-12-15 DIAGNOSIS — E039 Hypothyroidism, unspecified: Secondary | ICD-10-CM | POA: Diagnosis not present

## 2020-12-15 DIAGNOSIS — S82851A Displaced trimalleolar fracture of right lower leg, initial encounter for closed fracture: Secondary | ICD-10-CM | POA: Diagnosis present

## 2020-12-15 DIAGNOSIS — I7100 Dissection of unspecified site of aorta: Secondary | ICD-10-CM | POA: Diagnosis not present

## 2020-12-15 DIAGNOSIS — Z87891 Personal history of nicotine dependence: Secondary | ICD-10-CM | POA: Insufficient documentation

## 2020-12-15 DIAGNOSIS — W19XXXA Unspecified fall, initial encounter: Secondary | ICD-10-CM | POA: Insufficient documentation

## 2020-12-15 DIAGNOSIS — J441 Chronic obstructive pulmonary disease with (acute) exacerbation: Secondary | ICD-10-CM

## 2020-12-15 DIAGNOSIS — Z79899 Other long term (current) drug therapy: Secondary | ICD-10-CM | POA: Insufficient documentation

## 2020-12-15 HISTORY — DX: Pneumonia, unspecified organism: J18.9

## 2020-12-15 HISTORY — PX: ORIF ANKLE FRACTURE: SHX5408

## 2020-12-15 HISTORY — DX: Other seasonal allergic rhinitis: J30.2

## 2020-12-15 HISTORY — DX: Chronic obstructive pulmonary disease, unspecified: J44.9

## 2020-12-15 HISTORY — DX: Nausea with vomiting, unspecified: R11.2

## 2020-12-15 HISTORY — DX: Hypothyroidism, unspecified: E03.9

## 2020-12-15 HISTORY — DX: Other specified postprocedural states: Z98.890

## 2020-12-15 LAB — CBC
HCT: 34.1 % — ABNORMAL LOW (ref 36.0–46.0)
Hemoglobin: 11.6 g/dL — ABNORMAL LOW (ref 12.0–15.0)
MCH: 32 pg (ref 26.0–34.0)
MCHC: 34 g/dL (ref 30.0–36.0)
MCV: 93.9 fL (ref 80.0–100.0)
Platelets: 217 10*3/uL (ref 150–400)
RBC: 3.63 MIL/uL — ABNORMAL LOW (ref 3.87–5.11)
RDW: 12.6 % (ref 11.5–15.5)
WBC: 6.6 10*3/uL (ref 4.0–10.5)
nRBC: 0 % (ref 0.0–0.2)

## 2020-12-15 LAB — COMPREHENSIVE METABOLIC PANEL
ALT: 15 U/L (ref 0–44)
AST: 18 U/L (ref 15–41)
Albumin: 3.8 g/dL (ref 3.5–5.0)
Alkaline Phosphatase: 81 U/L (ref 38–126)
Anion gap: 11 (ref 5–15)
BUN: 14 mg/dL (ref 6–20)
CO2: 26 mmol/L (ref 22–32)
Calcium: 8.9 mg/dL (ref 8.9–10.3)
Chloride: 100 mmol/L (ref 98–111)
Creatinine, Ser: 0.74 mg/dL (ref 0.44–1.00)
GFR, Estimated: >60 mL/min (ref >60)
Glucose, Bld: 102 mg/dL — ABNORMAL HIGH (ref 70–99)
Potassium: 3.2 mmol/L — ABNORMAL LOW (ref 3.5–5.1)
Sodium: 137 mmol/L (ref 135–145)
Total Bilirubin: 1.2 mg/dL (ref 0.3–1.2)
Total Protein: 7.3 g/dL (ref 6.5–8.1)

## 2020-12-15 LAB — PROTIME-INR
INR: 2.5 — ABNORMAL HIGH (ref 0.8–1.2)
Prothrombin Time: 25.8 seconds — ABNORMAL HIGH (ref 11.4–15.2)

## 2020-12-15 SURGERY — OPEN REDUCTION INTERNAL FIXATION (ORIF) ANKLE FRACTURE
Anesthesia: General | Site: Ankle | Laterality: Right

## 2020-12-15 MED ORDER — HYDROMORPHONE HCL 1 MG/ML IJ SOLN
0.5000 mg | INTRAMUSCULAR | Status: DC | PRN
  Administered 2020-12-15: 1 mg via INTRAVENOUS
  Filled 2020-12-15: qty 1

## 2020-12-15 MED ORDER — FENTANYL CITRATE (PF) 100 MCG/2ML IJ SOLN
INTRAMUSCULAR | Status: AC
  Administered 2020-12-15: 100 ug via INTRAVENOUS
  Filled 2020-12-15: qty 2

## 2020-12-15 MED ORDER — DEXAMETHASONE SODIUM PHOSPHATE 10 MG/ML IJ SOLN
INTRAMUSCULAR | Status: DC | PRN
  Administered 2020-12-15: 6 mg via INTRAVENOUS

## 2020-12-15 MED ORDER — DIPHENHYDRAMINE HCL 12.5 MG/5ML PO ELIX: 12.5000 mg | ORAL_SOLUTION | ORAL | Status: DC | PRN

## 2020-12-15 MED ORDER — FENTANYL CITRATE (PF) 100 MCG/2ML IJ SOLN
INTRAMUSCULAR | Status: AC
  Filled 2020-12-15: qty 2

## 2020-12-15 MED ORDER — SODIUM CHLORIDE 0.9 % IR SOLN
Status: DC | PRN
  Administered 2020-12-15: 1000 mL

## 2020-12-15 MED ORDER — FENTANYL CITRATE (PF) 100 MCG/2ML IJ SOLN: 50.0000 ug | Freq: Once | INTRAMUSCULAR | Status: AC

## 2020-12-15 MED ORDER — CEFAZOLIN SODIUM-DEXTROSE 1-4 GM/50ML-% IV SOLN
1.0000 g | Freq: Four times a day (QID) | INTRAVENOUS | Status: AC
  Administered 2020-12-15 – 2020-12-16 (×3): 1 g via INTRAVENOUS
  Filled 2020-12-15 (×3): qty 50

## 2020-12-15 MED ORDER — ONDANSETRON HCL 4 MG/2ML IJ SOLN
INTRAMUSCULAR | Status: DC | PRN
  Administered 2020-12-15: 4 mg via INTRAVENOUS

## 2020-12-15 MED ORDER — METHOCARBAMOL 500 MG PO TABS
500.0000 mg | ORAL_TABLET | Freq: Four times a day (QID) | ORAL | Status: DC | PRN
  Administered 2020-12-15 – 2020-12-16 (×3): 500 mg via ORAL
  Filled 2020-12-15 (×3): qty 1

## 2020-12-15 MED ORDER — ACETAMINOPHEN 325 MG PO TABS
325.0000 mg | ORAL_TABLET | Freq: Four times a day (QID) | ORAL | Status: DC | PRN
  Administered 2020-12-16: 650 mg via ORAL
  Filled 2020-12-15: qty 2

## 2020-12-15 MED ORDER — SODIUM CHLORIDE 0.9 % IV SOLN: INTRAVENOUS | Status: DC

## 2020-12-15 MED ORDER — LEVOTHYROXINE SODIUM 125 MCG PO TABS
125.0000 ug | ORAL_TABLET | Freq: Every day | ORAL | Status: DC
  Administered 2020-12-16: 125 ug via ORAL
  Filled 2020-12-15: qty 1

## 2020-12-15 MED ORDER — IPRATROPIUM-ALBUTEROL 0.5-2.5 (3) MG/3ML IN SOLN
3.0000 mL | Freq: Once | RESPIRATORY_TRACT | Status: AC
  Administered 2020-12-15: 3 mL via RESPIRATORY_TRACT
  Filled 2020-12-15: qty 3

## 2020-12-15 MED ORDER — FLUTICASONE FUROATE-VILANTEROL 200-25 MCG/INH IN AEPB
1.0000 | INHALATION_SPRAY | Freq: Every day | RESPIRATORY_TRACT | Status: DC
  Filled 2020-12-15: qty 28

## 2020-12-15 MED ORDER — DOCUSATE SODIUM 100 MG PO CAPS
100.0000 mg | ORAL_CAPSULE | Freq: Two times a day (BID) | ORAL | Status: DC
  Administered 2020-12-15 – 2020-12-16 (×2): 100 mg via ORAL
  Filled 2020-12-15 (×2): qty 1

## 2020-12-15 MED ORDER — ONDANSETRON HCL 4 MG/2ML IJ SOLN
INTRAMUSCULAR | Status: AC
  Filled 2020-12-15: qty 2

## 2020-12-15 MED ORDER — AMLODIPINE BESYLATE 5 MG PO TABS
5.0000 mg | ORAL_TABLET | Freq: Every day | ORAL | Status: DC
  Administered 2020-12-16: 5 mg via ORAL
  Filled 2020-12-15: qty 1

## 2020-12-15 MED ORDER — ACETAMINOPHEN 325 MG PO TABS: 325.0000 mg | ORAL_TABLET | ORAL | Status: DC | PRN

## 2020-12-15 MED ORDER — IPRATROPIUM-ALBUTEROL 18-103 MCG/ACT IN AERO: 2.0000 | INHALATION_SPRAY | Freq: Four times a day (QID) | RESPIRATORY_TRACT | Status: DC | PRN

## 2020-12-15 MED ORDER — ROPIVACAINE HCL 7.5 MG/ML IJ SOLN
INTRAMUSCULAR | Status: DC | PRN
Start: 2020-12-15 — End: 2020-12-15
  Administered 2020-12-15 (×8): 5 mL via PERINEURAL

## 2020-12-15 MED ORDER — OXYCODONE HCL 5 MG PO TABS: 5.0000 mg | ORAL_TABLET | Freq: Once | ORAL | Status: AC | PRN

## 2020-12-15 MED ORDER — ONDANSETRON HCL 4 MG PO TABS
4.0000 mg | ORAL_TABLET | Freq: Four times a day (QID) | ORAL | Status: DC | PRN
  Administered 2020-12-16 (×2): 4 mg via ORAL
  Filled 2020-12-15 (×2): qty 1

## 2020-12-15 MED ORDER — DEXAMETHASONE SODIUM PHOSPHATE 10 MG/ML IJ SOLN
INTRAMUSCULAR | Status: AC
  Filled 2020-12-15: qty 1

## 2020-12-15 MED ORDER — PANTOPRAZOLE SODIUM 40 MG PO TBEC
40.0000 mg | DELAYED_RELEASE_TABLET | Freq: Every day | ORAL | Status: DC
  Administered 2020-12-16: 40 mg via ORAL
  Filled 2020-12-15: qty 1

## 2020-12-15 MED ORDER — OXYCODONE HCL 5 MG/5ML PO SOLN: 5.0000 mg | Freq: Once | ORAL | Status: AC | PRN

## 2020-12-15 MED ORDER — OXYCODONE HCL 5 MG PO TABS
10.0000 mg | ORAL_TABLET | ORAL | Status: DC | PRN
Start: 2020-12-15 — End: 2020-12-16

## 2020-12-15 MED ORDER — CHLORHEXIDINE GLUCONATE 0.12 % MT SOLN
15.0000 mL | Freq: Once | OROMUCOSAL | Status: AC
  Administered 2020-12-15: 15 mL via OROMUCOSAL

## 2020-12-15 MED ORDER — OXYCODONE HCL 5 MG PO TABS
5.0000 mg | ORAL_TABLET | ORAL | Status: DC | PRN
Start: 2020-12-15 — End: 2020-12-16
  Administered 2020-12-16 (×3): 10 mg via ORAL
  Filled 2020-12-15 (×3): qty 2

## 2020-12-15 MED ORDER — CLONIDINE HCL (ANALGESIA) 100 MCG/ML EP SOLN
EPIDURAL | Status: DC | PRN
  Administered 2020-12-15 (×2): 100 ug

## 2020-12-15 MED ORDER — ACETAMINOPHEN 160 MG/5ML PO SOLN: 325.0000 mg | ORAL | Status: DC | PRN

## 2020-12-15 MED ORDER — LIDOCAINE HCL (PF) 2 % IJ SOLN
INTRAMUSCULAR | Status: AC
  Filled 2020-12-15: qty 5

## 2020-12-15 MED ORDER — MIDAZOLAM HCL 2 MG/2ML IJ SOLN: 1.0000 mg | Freq: Once | INTRAMUSCULAR | Status: AC

## 2020-12-15 MED ORDER — WARFARIN SODIUM 5 MG PO TABS: 5.0000 mg | ORAL_TABLET | Freq: Every day | ORAL | Status: DC

## 2020-12-15 MED ORDER — LACTATED RINGERS IV SOLN: INTRAVENOUS | Status: DC

## 2020-12-15 MED ORDER — ONDANSETRON HCL 4 MG/2ML IJ SOLN: 4.0000 mg | Freq: Once | INTRAMUSCULAR | Status: DC | PRN

## 2020-12-15 MED ORDER — DEXAMETHASONE SODIUM PHOSPHATE 10 MG/ML IJ SOLN
INTRAMUSCULAR | Status: DC | PRN
  Administered 2020-12-15 (×2): 10 mg

## 2020-12-15 MED ORDER — MEPERIDINE HCL 50 MG/ML IJ SOLN: 6.2500 mg | INTRAMUSCULAR | Status: DC | PRN

## 2020-12-15 MED ORDER — WARFARIN SODIUM 2.5 MG PO TABS: 2.5000 mg | ORAL_TABLET | ORAL | Status: DC

## 2020-12-15 MED ORDER — ORAL CARE MOUTH RINSE: 15.0000 mL | Freq: Once | OROMUCOSAL | Status: AC

## 2020-12-15 MED ORDER — DOXYCYCLINE HYCLATE 100 MG PO TABS
50.0000 mg | ORAL_TABLET | Freq: Every day | ORAL | Status: DC
  Administered 2020-12-16: 50 mg via ORAL
  Filled 2020-12-15: qty 0.5

## 2020-12-15 MED ORDER — CARVEDILOL 25 MG PO TABS
25.0000 mg | ORAL_TABLET | Freq: Two times a day (BID) | ORAL | Status: DC
  Administered 2020-12-15 – 2020-12-16 (×2): 25 mg via ORAL
  Filled 2020-12-15 (×2): qty 1

## 2020-12-15 MED ORDER — OXYCODONE HCL 5 MG PO TABS
ORAL_TABLET | ORAL | Status: AC
  Administered 2020-12-15: 5 mg via ORAL
  Filled 2020-12-15: qty 1

## 2020-12-15 MED ORDER — METOCLOPRAMIDE HCL 5 MG/ML IJ SOLN
5.0000 mg | Freq: Three times a day (TID) | INTRAMUSCULAR | Status: DC | PRN
Start: 2020-12-15 — End: 2020-12-16
  Administered 2020-12-15: 10 mg via INTRAVENOUS
  Filled 2020-12-15: qty 2

## 2020-12-15 MED ORDER — FENTANYL CITRATE (PF) 100 MCG/2ML IJ SOLN
25.0000 ug | INTRAMUSCULAR | Status: DC | PRN
  Administered 2020-12-15: 25 ug via INTRAVENOUS

## 2020-12-15 MED ORDER — ONDANSETRON HCL 4 MG/2ML IJ SOLN
4.0000 mg | Freq: Four times a day (QID) | INTRAMUSCULAR | Status: DC | PRN
  Filled 2020-12-15: qty 2

## 2020-12-15 MED ORDER — WARFARIN - PHYSICIAN DOSING INPATIENT: Freq: Every day | Status: DC

## 2020-12-15 MED ORDER — ACETAMINOPHEN 10 MG/ML IV SOLN: 1000.0000 mg | Freq: Once | INTRAVENOUS | Status: DC | PRN

## 2020-12-15 MED ORDER — METHOCARBAMOL 1000 MG/10ML IJ SOLN
500.0000 mg | Freq: Four times a day (QID) | INTRAVENOUS | Status: DC | PRN
  Filled 2020-12-15: qty 5

## 2020-12-15 MED ORDER — MIDAZOLAM HCL 2 MG/2ML IJ SOLN
INTRAMUSCULAR | Status: AC
  Filled 2020-12-15: qty 2

## 2020-12-15 MED ORDER — PROPOFOL 10 MG/ML IV BOLUS
INTRAVENOUS | Status: DC | PRN
  Administered 2020-12-15: 150 mg via INTRAVENOUS

## 2020-12-15 MED ORDER — IPRATROPIUM-ALBUTEROL 20-100 MCG/ACT IN AERS
1.0000 | INHALATION_SPRAY | Freq: Four times a day (QID) | RESPIRATORY_TRACT | Status: DC | PRN
  Filled 2020-12-15: qty 4

## 2020-12-15 MED ORDER — CEFAZOLIN SODIUM-DEXTROSE 2-4 GM/100ML-% IV SOLN
2.0000 g | INTRAVENOUS | Status: AC
  Administered 2020-12-15: 2 g via INTRAVENOUS
  Filled 2020-12-15: qty 100

## 2020-12-15 MED ORDER — FUROSEMIDE 40 MG PO TABS
40.0000 mg | ORAL_TABLET | Freq: Every day | ORAL | Status: DC
  Administered 2020-12-16: 40 mg via ORAL
  Filled 2020-12-15: qty 1

## 2020-12-15 MED ORDER — WARFARIN SODIUM 5 MG PO TABS
5.0000 mg | ORAL_TABLET | Freq: Every day | ORAL | Status: DC
  Administered 2020-12-15: 5 mg via ORAL
  Filled 2020-12-15: qty 1

## 2020-12-15 MED ORDER — LIDOCAINE 2% (20 MG/ML) 5 ML SYRINGE
INTRAMUSCULAR | Status: DC | PRN
  Administered 2020-12-15: 100 mg via INTRAVENOUS

## 2020-12-15 MED ORDER — DOXYCYCLINE 40 MG PO CPDR: 40.0000 mg | DELAYED_RELEASE_CAPSULE | Freq: Every day | ORAL | Status: DC

## 2020-12-15 MED ORDER — METOCLOPRAMIDE HCL 5 MG PO TABS: 5.0000 mg | ORAL_TABLET | Freq: Three times a day (TID) | ORAL | Status: DC | PRN

## 2020-12-15 MED ORDER — MIDAZOLAM HCL 2 MG/2ML IJ SOLN
INTRAMUSCULAR | Status: AC
  Administered 2020-12-15: 2 mg via INTRAVENOUS
  Filled 2020-12-15: qty 2

## 2020-12-15 MED ORDER — FENTANYL CITRATE (PF) 100 MCG/2ML IJ SOLN
INTRAMUSCULAR | Status: DC | PRN
  Administered 2020-12-15: 50 ug via INTRAVENOUS

## 2020-12-15 SURGICAL SUPPLY — 54 items
BIT DRILL 2.5X2.75 QC CALB (BIT) ×2 IMPLANT
BIT DRILL 2.9 CANN QC NONSTRL (BIT) ×2 IMPLANT
BIT DRILL CALIBRATED 2.7 (BIT) ×2 IMPLANT
BNDG ELASTIC 4X5.8 VLCR STR LF (GAUZE/BANDAGES/DRESSINGS) ×2 IMPLANT
BNDG ELASTIC 6X5.8 VLCR STR LF (GAUZE/BANDAGES/DRESSINGS) ×2 IMPLANT
CLOTH BEACON ORANGE TIMEOUT ST (SAFETY) ×2 IMPLANT
COVER SURGICAL LIGHT HANDLE (MISCELLANEOUS) ×2 IMPLANT
COVER WAND RF STERILE (DRAPES) IMPLANT
CUFF TOURN SGL QUICK 34 (TOURNIQUET CUFF) ×1
CUFF TRNQT CYL 34X4.125X (TOURNIQUET CUFF) ×1 IMPLANT
DECANTER SPIKE VIAL GLASS SM (MISCELLANEOUS) ×2 IMPLANT
DRAPE C-ARM 42X120 X-RAY (DRAPES) ×2 IMPLANT
DRAPE C-ARMOR (DRAPES) ×2 IMPLANT
DRAPE U-SHAPE 47X51 STRL (DRAPES) ×2 IMPLANT
DRSG ADAPTIC 3X8 NADH LF (GAUZE/BANDAGES/DRESSINGS) IMPLANT
DRSG PAD ABDOMINAL 8X10 ST (GAUZE/BANDAGES/DRESSINGS) ×2 IMPLANT
DURAPREP 26ML APPLICATOR (WOUND CARE) ×2 IMPLANT
ELECT REM PT RETURN 15FT ADLT (MISCELLANEOUS) ×2 IMPLANT
GAUZE SPONGE 4X4 12PLY STRL (GAUZE/BANDAGES/DRESSINGS) ×2 IMPLANT
GAUZE XEROFORM 1X8 LF (GAUZE/BANDAGES/DRESSINGS) ×4 IMPLANT
GLOVE BIO SURGEON STRL SZ7.5 (GLOVE) ×2 IMPLANT
GLOVE ECLIPSE 8.0 STRL XLNG CF (GLOVE) ×2 IMPLANT
GLOVE SRG 8 PF TXTR STRL LF DI (GLOVE) ×2 IMPLANT
GLOVE SURG UNDER POLY LF SZ8 (GLOVE) ×2
GOWN STRL REUS W/TWL XL LVL3 (GOWN DISPOSABLE) ×4 IMPLANT
K-WIRE ACE 1.6X6 (WIRE) ×4
KIT BASIN OR (CUSTOM PROCEDURE TRAY) ×2 IMPLANT
KIT TURNOVER KIT A (KITS) ×2 IMPLANT
KWIRE ACE 1.6X6 (WIRE) ×2 IMPLANT
PACK ORTHO EXTREMITY (CUSTOM PROCEDURE TRAY) ×2 IMPLANT
PAD CAST 4YDX4 CTTN HI CHSV (CAST SUPPLIES) ×1 IMPLANT
PADDING CAST COTTON 4X4 STRL (CAST SUPPLIES) ×1
PADDING CAST COTTON 6X4 STRL (CAST SUPPLIES) ×2 IMPLANT
PENCIL SMOKE EVACUATOR (MISCELLANEOUS) ×2 IMPLANT
PLATE LOCK 4H 109 RT DIST FIB (Plate) ×2 IMPLANT
PROTECTOR NERVE ULNAR (MISCELLANEOUS) IMPLANT
SCREW ACE CAN 4.0 46M (Screw) ×4 IMPLANT
SCREW LOCK CORT STAR 3.5X14 (Screw) ×6 IMPLANT
SCREW LOCK CORT STAR 3.5X16 (Screw) ×2 IMPLANT
SCREW LOW PROFILE 12MMX3.5MM (Screw) ×2 IMPLANT
SCREW LOW PROFILE 18MMX3.5MM (Screw) ×2 IMPLANT
SCREW NON LOCKING LP 3.5 14MM (Screw) ×6 IMPLANT
SPLINT PLASTER CAST XFAST 5X30 (CAST SUPPLIES) ×1 IMPLANT
SPLINT PLASTER XFAST SET 5X30 (CAST SUPPLIES) ×1
SUCTION FRAZIER HANDLE 10FR (MISCELLANEOUS) ×1
SUCTION TUBE FRAZIER 10FR DISP (MISCELLANEOUS) ×1 IMPLANT
SUT ETHILON 2 0 PS N (SUTURE) ×2 IMPLANT
SUT VIC AB 0 CT1 36 (SUTURE) ×2 IMPLANT
SUT VIC AB 2-0 CT1 27 (SUTURE) ×1
SUT VIC AB 2-0 CT1 TAPERPNT 27 (SUTURE) ×1 IMPLANT
SYR CONTROL 10ML LL (SYRINGE) ×2 IMPLANT
TOWEL OR 17X26 10 PK STRL BLUE (TOWEL DISPOSABLE) ×4 IMPLANT
TOWEL OR NON WOVEN STRL DISP B (DISPOSABLE) ×2 IMPLANT
YANKAUER SUCT BULB TIP NO VENT (SUCTIONS) ×2 IMPLANT

## 2020-12-15 NOTE — Progress Notes (Signed)
Assisted Dr. Hatchett with right, ultrasound guided, popliteal, adductor canal block. Side rails up, monitors on throughout procedure. See vital signs in flow sheet. Tolerated Procedure well. 

## 2020-12-15 NOTE — Anesthesia Postprocedure Evaluation (Signed)
Anesthesia Post Note  Patient: Nancy [name]  Procedure(s) Performed: OPEN REDUCTION INTERNAL FIXATION (ORIF) RIGHT TRIMALLEOLAR ANKLE FRACTURE (Right Ankle)     Patient location during evaluation: PACU Anesthesia Type: General Level of consciousness: awake and sedated Pain management: pain level controlled Vital Signs Assessment: post-procedure vital signs reviewed and stable Respiratory status: spontaneous breathing Cardiovascular status: stable Postop Assessment: no apparent nausea or vomiting Anesthetic complications: no   No complications documented.  Last Vitals:  Vitals:   12/15/20 1341 12/15/20 1349  BP:    Pulse: 86   Resp: 12   Temp:    SpO2: 94% 100%    Last Pain:  Vitals:   12/15/20 1349  TempSrc:   PainSc: 6                  [facility] [address]

## 2020-12-15 NOTE — Addendum Note (Signed)
Addendum  created 12/15/20 1426 by Leilani Able, MD   Child order released for a procedure order, Clinical Note Signed, Intraprocedure Blocks edited, Intraprocedure Meds edited

## 2020-12-15 NOTE — Plan of Care (Signed)
Plan of care reviewed and discussed with the patient. 

## 2020-12-15 NOTE — Anesthesia Procedure Notes (Signed)
Anesthesia Regional Block: Popliteal block   Pre-Anesthetic Checklist: ,, timeout performed, Correct Patient, Correct Site, Correct Laterality, Correct Procedure, Correct Position, site marked, Risks and benefits discussed,  Surgical consent,  Pre-op evaluation,  At surgeon's request and post-op pain management  Laterality: Lower and Right  Prep: chloraprep       Needles:  Injection technique: Single-shot  Needle Type: Echogenic Stimulator Needle     Needle Length: 9cm  Needle Gauge: 20   Needle insertion depth: 2 cm   Additional Needles:   Procedures:,,,, ultrasound used (permanent image in chart),,,,  Narrative:  Start time: 12/15/2020 10:55 AM End time: 12/15/2020 11:05 AM Injection made incrementally with aspirations every 5 mL.  Performed by: Personally  Anesthesiologist: Leilani Able, MD

## 2020-12-15 NOTE — Transfer of Care (Signed)
Immediate Anesthesia Transfer of Care Note  Patient: Nancy [name]  Procedure(s) Performed: OPEN REDUCTION INTERNAL FIXATION (ORIF) RIGHT TRIMALLEOLAR ANKLE FRACTURE (Right Ankle)  Patient Location: PACU  Anesthesia Type:GA combined with regional for post-op pain  Level of Consciousness: sedated and patient cooperative  Airway & Oxygen Therapy: Patient Spontanous Breathing and Patient connected to face mask oxygen  Post-op Assessment: Report given to RN and Post -op Vital signs reviewed and stable  Post vital signs: Reviewed and stable  Last Vitals:  Vitals Value Taken Time  BP 161/94 12/15/20 1318  Temp    Pulse 89 12/15/20 1319  Resp 16 12/15/20 1319  SpO2 100 % 12/15/20 1319  Vitals shown include unvalidated device data.  Last Pain:  Vitals:   12/15/20 1110  TempSrc:   PainSc: 2       Patients Stated Pain Goal: 3 (12/14/20 1518)  Complications: No complications documented.

## 2020-12-15 NOTE — Interval H&P Note (Signed)
History and Physical Interval Note: The patient is here today for surgical fixation of an unstable right ankle fracture.  She understands our recommendation for the surgery.  The risk and symptoms have been discussed in detail and informed consent is obtained.  The right ankle has been marked.  There is been no interval change in her health status.  See recent H&P.  12/15/2020 10:05 AM  Nancy [name]  has presented today for surgery, with the diagnosis of right trimalleolar ankle fracture.  The various methods of treatment have been discussed with the patient and family. After consideration of risks, benefits and other options for treatment, the patient has consented to  Procedure(s): OPEN REDUCTION INTERNAL FIXATION (ORIF) RIGHT TRIMALLEOLAR ANKLE FRACTURE (Right) as a surgical intervention.  The patient's history has been reviewed, patient examined, no change in status, stable for surgery.  I have reviewed the patient's chart and labs.  Questions were answered to the patient's satisfaction.     [name]

## 2020-12-15 NOTE — Anesthesia Procedure Notes (Signed)
Anesthesia Regional Block: Adductor canal block   Pre-Anesthetic Checklist: ,, timeout performed, Correct Patient, Correct Site, Correct Laterality, Correct Procedure, Correct Position, site marked, Risks and benefits discussed,  Surgical consent,  Pre-op evaluation,  At surgeon's request and post-op pain management  Laterality: Lower and Right  Prep: chloraprep       Needles:  Injection technique: Single-shot  Needle Type: Echogenic Stimulator Needle     Needle Length: 9cm  Needle Gauge: 20   Needle insertion depth: 2 cm   Additional Needles:   Procedures:,,,, ultrasound used (permanent image in chart),,,,  Narrative:  Start time: 12/15/2020 11:07 AM End time: 12/15/2020 11:17 AM Injection made incrementally with aspirations every 5 mL. Anesthesiologist: Leilani Able, MD

## 2020-12-15 NOTE — Anesthesia Procedure Notes (Signed)
Procedure Name: LMA Insertion Date/Time: 12/15/2020 11:33 AM Performed by: Wynonia Sours, CRNA Pre-anesthesia Checklist: Patient identified, Emergency Drugs available, Suction available, Patient being monitored and Timeout performed Patient Re-evaluated:Patient Re-evaluated prior to induction Oxygen Delivery Method: Circle system utilized Preoxygenation: Pre-oxygenation with 100% oxygen Induction Type: IV induction LMA: LMA with gastric port inserted LMA Size: 4.0 Number of attempts: 1 Placement Confirmation: positive ETCO2 and breath sounds checked- equal and bilateral Tube secured with: Tape Dental Injury: Teeth and Oropharynx as per pre-operative assessment

## 2020-12-15 NOTE — Brief Op Note (Signed)
12/15/2020  12:57 PM  PATIENT:  Nancy [name]  59 y.o. female  PRE-OPERATIVE DIAGNOSIS:  right trimalleolar ankle fracture  POST-OPERATIVE DIAGNOSIS:  right trimalleolar ankle fracture  PROCEDURE:  Procedure(s): OPEN REDUCTION INTERNAL FIXATION (ORIF) RIGHT TRIMALLEOLAR ANKLE FRACTURE (Right)  SURGEON:  Surgeon(s) and Role:    [name], MD - Primary  PHYSICIAN ASSISTANT:  Rexene Edison, PA-C  ANESTHESIA:   regional and general  COUNTS:  YES  TOURNIQUET:  * Missing tourniquet times found for documented tourniquets in log: [id number] *  DICTATION: .Other Dictation: Dictation Number [id number]  PLAN OF CARE: Admit for overnight observation  PATIENT DISPOSITION:  PACU - hemodynamically stable.   Delay start of Pharmacological VTE agent (>24hrs) due to surgical blood loss or risk of bleeding: no

## 2020-12-16 DIAGNOSIS — S82851A Displaced trimalleolar fracture of right lower leg, initial encounter for closed fracture: Secondary | ICD-10-CM | POA: Diagnosis not present

## 2020-12-16 LAB — PROTIME-INR
INR: 3 — ABNORMAL HIGH (ref 0.8–1.2)
Prothrombin Time: 30.5 seconds — ABNORMAL HIGH (ref 11.4–15.2)

## 2020-12-16 MED ORDER — METHOCARBAMOL 500 MG PO TABS: 500.0000 mg | ORAL_TABLET | Freq: Four times a day (QID) | ORAL | 1 refills | Status: DC | PRN

## 2020-12-16 MED ORDER — OXYCODONE HCL 5 MG PO TABS: 5.0000 mg | ORAL_TABLET | ORAL | 0 refills | Status: DC | PRN

## 2020-12-16 MED ORDER — ONDANSETRON 4 MG PO TBDP: 4.0000 mg | ORAL_TABLET | Freq: Three times a day (TID) | ORAL | 0 refills | Status: DC | PRN

## 2020-12-16 NOTE — Plan of Care (Signed)
Pt ready to DC home 

## 2020-12-16 NOTE — Op Note (Signed)
NAME: Nancy [name], Nancy [name] MEDICAL RECORD [id number] ACCOUNT [id number] DATE OF BIRTH:07-Nov-1961 FACILITY[facility] PHYSICIAN:Lashaunta Sicard Aretha Parrot, MD  OPERATIVE REPORT  DATE OF PROCEDURE:  12/15/2020  PREOPERATIVE DIAGNOSIS:  Right trimalleolar ankle fracture dislocation.  POSTOPERATIVE DIAGNOSIS:  Right trimalleolar ankle fracture dislocation.  PROCEDURE:  Open reduction and internal fixation of right trimalleolar ankle fracture dislocation with fixation of the medial and lateral malleolus only.  IMPLANTS:  Biomet/Zimmer anatomic right fibular locking plate laterally and two 4.0 partially threaded cancellous screws medially.  SURGEON:  Vanita Panda. Magnus Ivan, MD  ASSISTANT:  Richardean Canal, PA-C.  ANESTHESIA: 1.  Right lower extremity popliteal block. 2.  General.  TOURNIQUET TIME:  Just over 1 hour.  ESTIMATED BLOOD LOSS:  Minimal.  COMPLICATIONS:  None.  INDICATIONS:  The patient is a 60 year old female who had an accidental mechanical fall last Saturday, sustaining a right ankle fracture dislocation.  She was seen in the hospital in [place] and they had to perform a closed reduction to get the  fracture in better position.  Given that this was a trimalleolar ankle fracture dislocation, she understands this is an unstable fracture and we have recommended open reduction/internal fixation.  It looks like the posterior malleolus piece was less than  20% or even less than that of the articular surface and so likely fixation of that would not be warranted.  I have showed her ankle model and explained in detail what the surgery involves, including assessment of the risks and benefits of surgery.  DESCRIPTION OF PROCEDURE:  After informed consent was obtained and appropriate right ankle was marked, anesthesia obtained a popliteal block of the right lower extremity in the holding room.  She was then brought to the operating room and placed supine  on the operating  table.  General anesthesia was then obtained.  A nonsterile tourniquet was placed around her upper right thigh and her thigh, knee, leg, ankle and foot were prepped and draped with DuraPrep and sterile drapes.  A time-out was called.   She is identified as the correct patient, correct right ankle.  We then made an incision over the lateral malleolus and carried this proximally and distally.  We dissected down the fracture site and confirmed the fracture to be significantly comminuted.   We irrigated well with normal saline solution and removed fracture hematoma and then held the fracture, reduced with a tenaculum, but it was not in a position that we could lag the fracture due to her soft bone.  We then chose a Biomet/Zimmer titanium  lateral fibular plate for right ankle.  This was a composite locking plate, it is an anatomic plate for the distal fibula.  We secured this with bicortical screws proximally and locking screws distally.  We then turned to the medial side of the ankle.  I  made an incision on the medial side of the ankle and was dissected down to the medial malleolus fracture piece.  We irrigated out the ankle joint and found the medial talus to be intact.  We then temporarily held the medial malleolus fracture in reduced  position and placed 2 K-wires to further hold the position.  We then placed two 4.0 mm cannulated screws, which were partially threaded to size 46 screws.  Once we had secured the medial and lateral fracture pieces, I stressed the ankle under direct  fluoroscopy and visualization and the ankle mortise remained stable.  We then irrigated both wounds with normal saline solution and closed the deep  tissue over the hardware medially and laterally with 0 Vicryl followed by 2-0 Vicryl to close subcutaneous  tissue and interrupted 2-0 nylon in the medial and lateral incisions, well-padded sterile dressing was applied including a plaster splint, given the soft nature of her bone.  The  tourniquet was let down, her toes pinked in nicely.  She was taken to  recovery room in stable condition with all final counts being correct.  No complications noted.  Postoperatively, we are going to admit her overnight for antibiotics and pain control as well as therapy given her fall risk.  She will be nonweightbearing  on that right ankle for about 4-6 weeks.  Of note, Rexene Edison, PA-C, assisted during the entire case and assistance was crucial for facilitating all aspects of this case.  HN/NUANCE  D:12/15/2020 T:12/16/2020 JOB:[id number]

## 2020-12-16 NOTE — Discharge Instructions (Signed)
Increase your activities as comfort allows. Expect right foot and ankle swelling - ice and elevation as needed. Keep your splint clean and dry.

## 2020-12-16 NOTE — TOC Initial Note (Signed)
Transition of Care [facility]) - Initial/Assessment Note    Patient Details  Name: Nancy [name] MRN: [id number] Date of Birth: [birth year]  Transition of Care [facility] CM/SW Contact:    Armanda Heritage, RN Phone Number: 12/16/2020, 10:47 AM  Clinical Narrative:                 Patient reports has all needed dme at home.  Referral made to [facility] for HHPT needs.  Requested clinicals sent to [facility], who will arrange [facility] and notify patient of agency name.  Expected Discharge Plan: Home w Home Health Services Barriers to Discharge: No Barriers Identified   Patient Goals and CMS Choice Patient states their goals for this hospitalization and ongoing recovery are:: to go home      Expected Discharge Plan and Services Expected Discharge Plan: Home w Home Health Services   Discharge Planning Services: CM Consult   Living arrangements for the past 2 months: Single Family Home Expected Discharge Date: 12/16/20               DME Arranged: N/A DME Agency: NA       HH Arranged: PT (see note)       Representative spoke with at [facility] Agency: referral made to [employer] (CM agency for [employer])  Prior Living Arrangements/Services Living arrangements for the past 2 months: Single Family Home   Patient language and need for interpreter reviewed:: Yes Do you feel safe going back to the place where you live?: Yes      Need for Family Participation in Patient Care: Yes (Comment) Care giver support system in place?: Yes (comment)   Criminal Activity/Legal Involvement Pertinent to Current Situation/Hospitalization: No - Comment as needed  Activities of Daily Living Home Assistive Devices/Equipment: Contact lenses,Walker (specify type) (Knee scooter) ADL Screening (condition at time of admission) Patient's cognitive ability adequate to safely complete daily activities?: Yes Is the patient deaf or have difficulty hearing?: No Does the patient have difficulty seeing, even when wearing glasses/contacts?:  No Does the patient have difficulty concentrating, remembering, or making decisions?: No Patient able to express need for assistance with ADLs?: Yes Does the patient have difficulty dressing or bathing?: No Independently performs ADLs?: Yes (appropriate for developmental age) Does the patient have difficulty walking or climbing stairs?: No Weakness of Legs: None Weakness of Arms/Hands: None  Permission Sought/Granted                  Emotional Assessment              Admission diagnosis:  Trimalleolar fracture of right ankle [S82.851A] Patient Active Problem List   Diagnosis Date Noted  . Trimalleolar fracture of right ankle 12/15/2020  . Closed trimalleolar fracture of right ankle 12/14/2020  . Hyperlipidemia 12/13/2019  . S/P complete hysterectomy 01/29/2018  . Urinary retention 01/02/2018  . Pain in right foot 10/14/2016  . Allergic rhinitis 09/18/2015  . 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 Anxiety 05/09/2015  . CAFL (chronic airflow limitation) (HCC) 05/09/2015  . Clinical depression 05/09/2015  . Abnormal LFTs 05/09/2015  . Hydronephrosis 05/09/2015  . Adult hypothyroidism 05/09/2015  . AI (aortic incompetence) 05/09/2015  . D-dimer, elevated 05/09/2015  . 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 Avitaminosis D 05/09/2015  . Essential (primary) hypertension 05/09/2015  . Aortic valve defect 05/09/2015  . Chronic obstructive pulmonary disease (HCC) 05/09/2015  . History of open heart surgery 11/22/2013  . Congenital obstruction of ureteropelvic junction  01/28/2013  . Aortic dissection (HCC) 07/03/2011   PCP:  Tamsen Roers, PA-C Pharmacy:   [facility] - [place], [place] - [phone] [place] [address] Phone: [phone] Fax: [phone]  [facility] - [place], [place] - [address] [place] [address] Phone: [phone] Fax:  [phone]  [id number] [place], [place] - [address] AT [facility] [address] [place] [address] Phone: [phone] Fax: [phone]     Social Determinants of Health (SDOH) Interventions    Readmission Risk Interventions No flowsheet data found.

## 2020-12-16 NOTE — Discharge Summary (Signed)
Patient ID: Nancy [name] MRN: [id number] DOB/AGE: March 04, 1961 60 y.o.  Admit date: 12/15/2020 Discharge date: 12/16/2020  Admission Diagnoses:  Principal Problem:   Closed trimalleolar fracture of right ankle Active Problems:   Trimalleolar fracture of right ankle   Discharge Diagnoses:  Same  Past Medical History:  Diagnosis Date  . Aortic dissection ([place]) 2010  . COPD (chronic obstructive pulmonary disease) ([place])   . Depression   . Hypertension   . Hypothyroidism   . Pneumonia   . PONV (postoperative nausea and vomiting)   . Rosacea   . Seasonal allergies   . Sleep apnea    Does not use CPAP    Surgeries: Procedure(s): OPEN REDUCTION INTERNAL FIXATION (ORIF) RIGHT TRIMALLEOLAR ANKLE FRACTURE on 12/15/2020   Consultants:   Discharged Condition: Improved  Hospital Course: Nancy [name] is an 60 y.o. female who was admitted 12/15/2020 for operative treatment ofClosed trimalleolar fracture of right ankle. Patient has severe unremitting pain that affects sleep, daily activities, and work/hobbies. After pre-op clearance the patient was taken to the operating room on 12/15/2020 and underwent  Procedure(s): OPEN REDUCTION INTERNAL FIXATION (ORIF) RIGHT TRIMALLEOLAR ANKLE FRACTURE.    Patient was given perioperative antibiotics:  Anti-infectives (From admission, onward)   Start     Dose/Rate Route Frequency Ordered Stop   12/16/20 1000  doxycycline (VIBRA-TABS) tablet 50 mg        50 mg Oral Daily 12/15/20 1509     12/15/20 1800  ceFAZolin (ANCEF) IVPB 1 g/50 mL premix        1 g 100 mL/hr over 30 Minutes Intravenous Every 6 hours 12/15/20 1432 12/16/20 0645   12/15/20 0815  ceFAZolin (ANCEF) IVPB 2g/100 mL premix        2 g 200 mL/hr over 30 Minutes Intravenous On call to O.R. 12/15/20 0802 12/15/20 1204       Patient was given sequential compression devices, early ambulation, and chemoprophylaxis to prevent DVT.  Patient benefited maximally from hospital stay and there were  no complications.    Recent vital signs:  Patient Vitals for the past 24 hrs:  BP Temp Temp src Pulse Resp SpO2 Weight  12/16/20 0511 135/68 98.5 F (36.9 C) - 72 18 99 % -  12/16/20 0150 130/70 98.3 F (36.8 C) Oral 75 17 99 % -  12/15/20 2048 116/63 98.6 F (37 C) Oral 74 17 97 % -  12/15/20 1746 111/64 97.6 F (36.4 C) Oral 79 15 97 % -  12/15/20 1659 (!) 105/59 97.8 F (36.6 C) Oral 77 - 93 % -  12/15/20 1547 (!) 114/57 97.9 F (36.6 C) Oral 83 14 92 % -  12/15/20 1512 - - - - - 94 % -  12/15/20 1439 115/63 98.2 F (36.8 C) Oral 83 20 95 % -  12/15/20 1414 - - - 91 16 94 % -  12/15/20 1410 - - - 92 (!) 22 - -  12/15/20 1400 116/67 98.5 F (36.9 C) - 91 (!) 21 96 % -  12/15/20 1349 - - - - - 100 % -  12/15/20 1345 128/71 - - 87 12 94 % -  12/15/20 1341 - - - 86 12 94 % -  12/15/20 1336 - - - 90 14 97 % -  12/15/20 1332 131/78 - - 89 11 100 % -  12/15/20 1318 (!) 161/94 98.5 F (36.9 C) - 91 13 100 % -  12/15/20 1115 - - - 88 11 95 % -  12/15/20 1110 131/72 - - 89 19 95 % -  12/15/20 1051 125/79 - - 82 14 100 % -  12/15/20 0840 - - - - - - 86.2 kg  12/15/20 0836 138/68 98.1 F (36.7 C) Oral 91 16 92 % -     Recent laboratory studies:  Recent Labs    12/15/20 0845 12/16/20 0324  WBC 6.6  --   HGB 11.6*  --   HCT 34.1*  --   PLT 217  --   NA 137  --   K 3.2*  --   CL 100  --   CO2 26  --   BUN 14  --   CREATININE 0.74  --   GLUCOSE 102*  --   INR 2.5* 3.0*  CALCIUM 8.9  --      Discharge Medications:   Allergies as of 12/16/2020      Reactions   Codeine Nausea Only   Can take cough syrup.      Medication List    STOP taking these medications   oxyCODONE-acetaminophen 5-325 MG tablet Commonly known as: Percocet     TAKE these medications   albuterol-ipratropium 18-103 MCG/ACT inhaler Commonly known as: COMBIVENT Inhale 2 puffs into the lungs every 6 (six) hours as needed for wheezing or shortness of breath.   amLODipine 5 MG tablet Commonly  known as: NORVASC Take 5 mg by mouth daily.   azithromycin 250 MG tablet Commonly known as: ZITHROMAX Take 2 tablets today by mouth then 1 daily for 4 days.   budesonide-formoterol 160-4.5 MCG/ACT inhaler Commonly known as: SYMBICORT Inhale 2 puffs into the lungs daily.   carvedilol 25 MG tablet Commonly known as: COREG Take 25 mg by mouth 2 (two) times daily.   doxycycline 40 MG capsule Commonly known as: ORACEA TAKE 1 CAPSULE BY MOUTH DAILY WITH FOOD What changed: See the new instructions.   furosemide 40 MG tablet Commonly known as: LASIX Take 40 mg by mouth daily.   levothyroxine 125 MCG tablet Commonly known as: SYNTHROID Take 1 tablet (125 mcg total) by mouth daily before breakfast. Please schedule a physical before anymore refills.   ondansetron 4 MG disintegrating tablet Commonly known as: Zofran ODT Take 1 tablet (4 mg total) by mouth every 8 (eight) hours as needed for nausea or vomiting.   oxyCODONE 5 MG immediate release tablet Commonly known as: Roxicodone Take 1-2 tablets (5-10 mg total) by mouth every 4 (four) hours as needed.   warfarin 2.5 MG tablet Commonly known as: COUMADIN Take as directed. If you are unsure how to take this medication, talk to your nurse or doctor. Original instructions: Take 5 mg by mouth daily.            Durable Medical Equipment  (From admission, onward)         Start     Ordered   12/15/20 1433  DME 3 n 1  Once        12/15/20 1432   12/15/20 1433  DME Walker rolling  Once       Question Answer Comment  Walker: With 5 Inch Wheels   Patient needs a walker to treat with the following condition Trimalleolar fracture of right ankle      12/15/20 1432          Diagnostic Studies: DG Ankle 2 Views Right  Result Date: 12/15/2020 CLINICAL DATA:  Intraoperative examination, right ankle ORIF EXAM: RIGHT ANKLE - 2 VIEW; DG C-ARM 1-60  MIN-NO REPORT COMPARISON:  12/09/2020 FINDINGS: Three fluoroscopic intraoperative  radiographs of the reported right ankle demonstrate surgical changes of open reduction internal fixation of a trimalleolar fracture with a lateral fibular cortical plate and screws and 2 partially threaded screws within the medial malleolus. Posterior malleolar fracture appears minimally displaced. There is normal alignment. No unexpected fracture or dislocation. Normal alignment of the ankle mortise on this limited examination. FLUOROSCOPY TIME:  Time: 28 seconds Fluoroscopic dose: 1.2 mGy Fluoroscopic images: 3 IMPRESSION: ORIF of trimalleolar right ankle fracture as described above. Electronically Signed   By: Helyn Numbers MD   On: 12/15/2020 13:08   DG Ankle Complete Right  Result Date: 12/09/2020 CLINICAL DATA:  Post reduction EXAM: RIGHT ANKLE - COMPLETE 3+ VIEW COMPARISON:  X-ray right ankle 12/08/2020, x-ray right ankle 12/09/2020 2:10 a.m. FINDINGS: Redemonstration of a trimalleolar fracture with improved anatomical alignment and reduction of the tibiotalar joint status post cast placement. A medial talar fracture cannot be excluded. IMPRESSION: 1. Status post reduction/cast placement with improved/proper alignment of tibiotalar joint. 2. Trimalleolar fracture with improved alignment. 3. A medial talar fracture cannot be excluded. Electronically Signed   By: Tish Frederickson M.D.   On: 12/09/2020 04:19   DG Ankle Complete Right  Result Date: 12/09/2020 CLINICAL DATA:  Status post reduction EXAM: RIGHT ANKLE - COMPLETE 3+ VIEW COMPARISON:  12/09/2020 FINDINGS: There is a splint around the left ankle. There is worsened alignment at the tibiotalar joint in the anterior-posterior direction following reduction. The degree of posterior displacement of the talus relative to the tibia has increased. Otherwise, alignment is unchanged from the earlier radiograph. IMPRESSION: Worsened alignment at the tibiotalar joint in the anterior-posterior direction following reduction. Electronically Signed   By: Deatra Robinson M.D.   On: 12/09/2020 02:10   DG Ankle Complete Right  Result Date: 12/09/2020 CLINICAL DATA:  Status post reduction EXAM: RIGHT ANKLE - COMPLETE 3+ VIEW COMPARISON:  Films from earlier in the same day. FINDINGS: Some reduction of the ankle fracture has been performed. There remains lateral displacement of the talus with respect to the distal tibia. Casting material is noted. Posterior malleolar fracture is seen as well. IMPRESSION: Trimalleolar fracture with some interval reduction although the talus remains somewhat laterally displaced with respect to the distal tibia. Electronically Signed   By: Alcide Clever M.D.   On: 12/09/2020 00:35   DG Ankle Complete Right  Result Date: 12/08/2020 CLINICAL DATA:  Fall, deformity and right ankle. EXAM: RIGHT ANKLE - COMPLETE 3+ VIEW COMPARISON:  X-ray right foot 10/14/2016. FINDINGS: Transverse laterally displaced transsyndesmotic distal fibular fracture. A superimposed fracture fragment on the cross-table lateral may be related to the lateral malleolar fracture or a posterior malleolar fracture. Likely posterior malleolar fracture poorly visualized on this study. Transverse comminuted and displaced distal medial malleolar fracture. Associated widening of the mediolateral clear spaces with tibiotalar dislocation. Associated subcutaneus soft tissue edema. A subcutaneus soft tissue emphysema retained radiopaque foreign body. IMPRESSION: Tibiotalar dislocation with definite displaced and comminuted bi-malleolar - likely trimalleolar fracture. Electronically Signed   By: Tish Frederickson M.D.   On: 12/08/2020 23:51   DG C-Arm 1-60 Min-No Report  Result Date: 12/15/2020 Fluoroscopy was utilized by the requesting physician.  No radiographic interpretation.   MM CLIP PLACEMENT RIGHT  Result Date: 11/21/2020 CLINICAL DATA:  Status post ultrasound-guided core needle biopsy of a 6 mm mass in the 11 o'clock position of the right breast and 4 mm intramammary lymph  node in the 10  o'clock position of the right breast. EXAM: DIAGNOSTIC RIGHT MAMMOGRAM POST ULTRASOUND BIOPSY X 2 COMPARISON:  Previous exam(s). FINDINGS: Mammographic images were obtained following ultrasound guided biopsy of the recently demonstrated 6 mm mass in the 11 o'clock position of the right breast and ultrasound-guided biopsy of the recently demonstrated 4 mm intramammary lymph node with eccentric cortical thickening in the 10 o'clock position of the right breast. The biopsy marking clips are in expected positions at the sites of biopsy. IMPRESSION: Appropriate positioning of the X shaped biopsy marking clip at the site of biopsy in the mass in the 11 o'clock position of the right breast and Vision shaped biopsy marker clip at the site of biopsy in the intramammary lymph node in the 10 o'clock position of the right breast. Final Assessment: Post Procedure Mammograms for Marker Placement Electronically Signed   By: Claudie Revering M.D.   On: 11/21/2020 09:35   [place] RT BREAST BX W LOC DEV 1ST LESION IMG BX SPEC US GUIDE  Addendum Date: 11/23/2020   ADDENDUM REPORT: 11/23/2020 11:47 ADDENDUM: PATHOLOGY revealed: Site A. BREAST, RIGHT 11:00 8 CM FN; ULTRASOUND-GUIDED BIOPSY: - LYMPH NODE WITH CHANGES CONSISTENT WITH DERMATOPATHIC LYMPHADENITIS. - MAMMARY EPITHELIUM / TISSUE IS NOT IDENTIFIED. - MORPHOLOGIC EVIDENCE OF MALIGNANCY IS NOT IDENTIFIED. Pathology results are CONCORDANT with imaging findings, per Dr. Claudie Revering. PATHOLOGY revealed: Site B. BREAST, RIGHT 10:00 6 CM FN; ULTRASOUND-GUIDED BIOPSY: - LYMPH NODE WITH CHANGES CONSISTENT WITH DERMATOPATHIC LYMPHADENITIS. - MAMMARY EPITHELIUM / TISSUE IS NOT IDENTIFIED. - MORPHOLOGIC EVIDENCE OF MALIGNANCY IS NOT IDENTIFIED. Pathology results are CONCORDANT with imaging findings, per Dr. Claudie Revering. Pathology results and recommendations below were discussed with patient by telephone on 11/22/2020. Patient reported biopsy site within normal limits with slight  tenderness at the site. Post biopsy care instructions were reviewed, questions were answered and my direct phone number was provided to patient. Patient was instructed to call [facility] if any concerns or questions arise related to the biopsy. Recommendation: Resume annual bilateral screening mammogram due December 2022. Pathology results reported by Electa Sniff RN on 11/23/2020. Electronically Signed   By: Claudie Revering M.D.   On: 11/23/2020 11:47   Result Date: 11/23/2020 CLINICAL DATA:  6 mm indeterminate mass in the 11 o'clock position of the right breast and 9 mm indeterminate lymph node in the axillary tail region of the right breast at recent mammography and ultrasound. EXAM: ULTRASOUND GUIDED RIGHT BREAST CORE NEEDLE BIOPSY X 2 COMPARISON:  Previous exam(s). PROCEDURE: I met with the patient and we discussed the procedure of ultrasound-guided biopsy, including benefits and alternatives. We discussed the high likelihood of a successful procedures. We discussed the risks of the procedures, including infection, bleeding, tissue injury, clip migration, and inadequate sampling. Informed written consent was given. The usual time-out protocol was performed immediately prior to the procedures. Preliminary ultrasound of the breast demonstrated the 6 mm mass in the 11 o'clock position of the right breast, 8 cm from the nipple. The previously demonstrated 9 mm lymph node in the inferior axilla/axillary tail region seen on 11/07/2020 do not stand out as an abnormal lymph nodes today. There was a previously demonstrated 4 mm intramammary lymph node in the 10 o'clock position of the right breast, 6 cm from the nipple, with eccentric cortical thickening and flattening of the normal fatty hilum. Therefore, this lymph node was biopsied today. SITE 1: 6 MM MASS IN THE 11 O'CLOCK POSITION OF THE RIGHT BREAST Lesion quadrant: Upper outer quadrant  Using sterile technique and 1% Lidocaine as local anesthetic, under  direct ultrasound visualization, a 14 gauge spring-loaded device was used to perform biopsy of the recently demonstrated 6 mm mass in the 11 o'clock position of the right breast, using a caudal approach. At the conclusion of the procedure an x shaped tissue marker clip was deployed into the biopsy cavity. Follow up 2 view mammogram was performed and dictated separately. SITE 2: 4 MM INTRAMAMMARY LYMPH NODE IN THE 10 O'CLOCK POSITION OF THE RIGHT BREAST Lesion quadrant: Upper outer quadrant Using sterile technique and 1% Lidocaine as local anesthetic, under direct ultrasound visualization, a 14 gauge spring-loaded device was used to perform biopsy of the recently demonstrated 4 mm intramammary lymph node with eccentric cortical thickening in the 10 o'clock position of the right breast, 6 cm from the nipple, using a caudal approach. At the conclusion of the procedure a Vision shaped tissue marker clip was deployed into the biopsy cavity. Follow up 2 view mammogram was performed and dictated separately. In the is IMPRESSION: Ultrasound guided biopsy of the recently demonstrated 6 mm mass in the 11 o'clock position of the right breast and 4 mm intramammary lymph node with eccentric cortical thickening in the 10 o'clock position of the right breast. No apparent complications. Electronically Signed: By: Claudie Revering M.D. On: 11/21/2020 09:24   [place] RT BREAST BX W LOC DEV EA ADD LESION IMG BX SPEC US GUIDE  Addendum Date: 11/23/2020   ADDENDUM REPORT: 11/23/2020 11:47 ADDENDUM: PATHOLOGY revealed: Site A. BREAST, RIGHT 11:00 8 CM FN; ULTRASOUND-GUIDED BIOPSY: - LYMPH NODE WITH CHANGES CONSISTENT WITH DERMATOPATHIC LYMPHADENITIS. - MAMMARY EPITHELIUM / TISSUE IS NOT IDENTIFIED. - MORPHOLOGIC EVIDENCE OF MALIGNANCY IS NOT IDENTIFIED. Pathology results are CONCORDANT with imaging findings, per Dr. Claudie Revering. PATHOLOGY revealed: Site B. BREAST, RIGHT 10:00 6 CM FN; ULTRASOUND-GUIDED BIOPSY: - LYMPH NODE WITH CHANGES  CONSISTENT WITH DERMATOPATHIC LYMPHADENITIS. - MAMMARY EPITHELIUM / TISSUE IS NOT IDENTIFIED. - MORPHOLOGIC EVIDENCE OF MALIGNANCY IS NOT IDENTIFIED. Pathology results are CONCORDANT with imaging findings, per Dr. Claudie Revering. Pathology results and recommendations below were discussed with patient by telephone on 11/22/2020. Patient reported biopsy site within normal limits with slight tenderness at the site. Post biopsy care instructions were reviewed, questions were answered and my direct phone number was provided to patient. Patient was instructed to call [facility] if any concerns or questions arise related to the biopsy. Recommendation: Resume annual bilateral screening mammogram due December 2022. Pathology results reported by Electa Sniff RN on 11/23/2020. Electronically Signed   By: Claudie Revering M.D.   On: 11/23/2020 11:47   Result Date: 11/23/2020 CLINICAL DATA:  6 mm indeterminate mass in the 11 o'clock position of the right breast and 9 mm indeterminate lymph node in the axillary tail region of the right breast at recent mammography and ultrasound. EXAM: ULTRASOUND GUIDED RIGHT BREAST CORE NEEDLE BIOPSY X 2 COMPARISON:  Previous exam(s). PROCEDURE: I met with the patient and we discussed the procedure of ultrasound-guided biopsy, including benefits and alternatives. We discussed the high likelihood of a successful procedures. We discussed the risks of the procedures, including infection, bleeding, tissue injury, clip migration, and inadequate sampling. Informed written consent was given. The usual time-out protocol was performed immediately prior to the procedures. Preliminary ultrasound of the breast demonstrated the 6 mm mass in the 11 o'clock position of the right breast, 8 cm from the nipple. The previously demonstrated 9 mm lymph node in the inferior axilla/axillary tail  region seen on 11/07/2020 do not stand out as an abnormal lymph nodes today. There was a previously demonstrated 4 mm  intramammary lymph node in the 10 o'clock position of the right breast, 6 cm from the nipple, with eccentric cortical thickening and flattening of the normal fatty hilum. Therefore, this lymph node was biopsied today. SITE 1: 6 MM MASS IN THE 11 O'CLOCK POSITION OF THE RIGHT BREAST Lesion quadrant: Upper outer quadrant Using sterile technique and 1% Lidocaine as local anesthetic, under direct ultrasound visualization, a 14 gauge spring-loaded device was used to perform biopsy of the recently demonstrated 6 mm mass in the 11 o'clock position of the right breast, using a caudal approach. At the conclusion of the procedure an x shaped tissue marker clip was deployed into the biopsy cavity. Follow up 2 view mammogram was performed and dictated separately. SITE 2: 4 MM INTRAMAMMARY LYMPH NODE IN THE 10 O'CLOCK POSITION OF THE RIGHT BREAST Lesion quadrant: Upper outer quadrant Using sterile technique and 1% Lidocaine as local anesthetic, under direct ultrasound visualization, a 14 gauge spring-loaded device was used to perform biopsy of the recently demonstrated 4 mm intramammary lymph node with eccentric cortical thickening in the 10 o'clock position of the right breast, 6 cm from the nipple, using a caudal approach. At the conclusion of the procedure a Vision shaped tissue marker clip was deployed into the biopsy cavity. Follow up 2 view mammogram was performed and dictated separately. In the is IMPRESSION: Ultrasound guided biopsy of the recently demonstrated 6 mm mass in the 11 o'clock position of the right breast and 4 mm intramammary lymph node with eccentric cortical thickening in the 10 o'clock position of the right breast. No apparent complications. Electronically Signed: By: Claudie Revering M.D. On: 11/21/2020 09:24    Disposition: Discharge disposition: 01-Home or [place]    Mcarthur Rossetti, MD Follow up in 2 week(s).   Specialty: Orthopedic Surgery Contact  information: [address] [place] [address] [phone]                Signed: Mcarthur Rossetti 12/16/2020, 8:32 AM

## 2020-12-16 NOTE — Progress Notes (Signed)
Subjective: 1 Day Post-Op Procedure(s) (LRB): OPEN REDUCTION INTERNAL FIXATION (ORIF) RIGHT TRIMALLEOLAR ANKLE FRACTURE (Right) Patient reports pain as moderate.     Objective: Vital signs in last 24 hours: Temp:  [97.6 F (36.4 C)-98.6 F (37 C)] 98.5 F (36.9 C) (02/05 0511) Pulse Rate:  [72-92] 72 (02/05 0511) Resp:  [11-22] 18 (02/05 0511) BP: (105-161)/(57-94) 135/68 (02/05 0511) SpO2:  [92 %-100 %] 99 % (02/05 0511) Weight:  [86.2 kg] 86.2 kg (02/04 0840)  Intake/Output from previous day: 02/04 0701 - 02/05 0700 In: 2786.1 [P.O.:725; I.V.:1861.1; IV Piggyback:200] Out: 1000 [Urine:1000] Intake/Output this shift: No intake/output data recorded.  Recent Labs    12/15/20 0845  HGB 11.6*   Recent Labs    12/15/20 0845  WBC 6.6  RBC 3.63*  HCT 34.1*  PLT 217   Recent Labs    12/15/20 0845  NA 137  K 3.2*  CL 100  CO2 26  BUN 14  CREATININE 0.74  GLUCOSE 102*  CALCIUM 8.9   Recent Labs    12/15/20 0845 12/16/20 0324  INR 2.5* 3.0*    Her right ankle splint is intact.   Assessment/Plan: 1 Day Post-Op Procedure(s) (LRB): OPEN REDUCTION INTERNAL FIXATION (ORIF) RIGHT TRIMALLEOLAR ANKLE FRACTURE (Right) Up with therapy - NWB right ankle Discharge to home this afternoon.      [name] 12/16/2020, 8:25 AM

## 2020-12-16 NOTE — Evaluation (Signed)
Physical Therapy Evaluation Patient Details Name: Nancy [name] MRN: [id number] DOB: Jun 20, 1961 Today's Date: 12/16/2020   History of Present Illness  s/p ORIF R ankle trimalleolar fx.  Clinical Impression  Patient evaluated by Physical Therapy with no further acute PT needs identified. All education has been completed and the patient has no further questions.  Pt doing well today. See below for mobility details. Ready for d/c with family assist prn. Pt has DME. See below for any follow-up Physical Therapy or equipment needs. PT is signing off. Thank you for this referral.    Follow Up Recommendations Other (comment) (per MD after f/u)    Equipment Recommendations  None recommended by PT    Recommendations for Other Services       Precautions / Restrictions Precautions Precautions: Fall Restrictions Weight Bearing Restrictions: Yes RLE Weight Bearing: Non weight bearing      Mobility  Bed Mobility Overal bed mobility: Needs Assistance Bed Mobility: Supine to Sit     Supine to sit: Min assist     General bed mobility comments: assist with RLE    Transfers Overall transfer level: Needs assistance Equipment used: Rolling walker (2 wheeled) Transfers: Sit to/from Stand Sit to Stand: Min guard;Supervision         General transfer comment: cues for hand placement  Ambulation/Gait Ambulation/Gait assistance: Min guard Gait Distance (Feet): 30 Feet Assistive device: Rolling walker (2 wheeled)       General Gait Details: cues for RW position and safety. ablew to maintain NWB however UEs fatigue quickly requiring 1 standing rest during distance above  Stairs Stairs: Yes Stairs assistance: Min assist Stair Management: With walker;Backwards Number of Stairs: 1 General stair comments: cues for sequence and technique  Wheelchair Mobility    Modified Rankin (Stroke Patients Only)       Balance Overall balance assessment: Needs assistance   Sitting  balance-Leahy Scale: Good     Standing balance support: Bilateral upper extremity supported Standing balance-Leahy Scale: Poor Standing balance comment: reliant on UEs for static stand, leans forearms on RW to don mask                             Pertinent Vitals/Pain Pain Assessment: 0-10 Pain Score: 4  Pain Location: right ankle Pain Descriptors / Indicators: Aching;Sore Pain Intervention(s): Limited activity within patient's tolerance;Monitored during session;Premedicated before session;Repositioned    Home Living Family/patient expects to be discharged to:: Private residence Living Arrangements: Alone   Type of Home: House Home Access: Stairs to enter   [employer] of Steps: 1 Home Layout: Two level;Able to live on main level with bedroom/bathroom Home Equipment: Gilford Rile - 2 wheels;Wheelchair - manual      Prior Function Level of Independence: Independent               Hand Dominance        Extremity/Trunk Assessment   Upper Extremity Assessment Upper Extremity Assessment: Overall WFL for tasks assessed    Lower Extremity Assessment Lower Extremity Assessment: RLE deficits/detail RLE Deficits / Details: hip flexion and knee extension 2+/5; lower leg splint in place RLE: Unable to fully assess due to immobilization       Communication   Communication: No difficulties  Cognition Arousal/Alertness: Awake/alert Behavior During Therapy: WFL for tasks assessed/performed Overall Cognitive Status: Within Functional Limits for tasks assessed  General Comments      Exercises     Assessment/Plan    PT Assessment All further PT needs can be met in the next venue of care  PT Problem List         PT Treatment Interventions      PT Goals (Current goals can be found in the Care Plan section)  Acute Rehab PT Goals Patient Stated Goal: to go home today PT Goal Formulation: All  assessment and education complete, DC therapy    Frequency     Barriers to discharge        Co-evaluation               AM-PAC PT "6 Clicks" Mobility  Outcome Measure Help needed turning from your back to your side while in a flat bed without using bedrails?: None Help needed moving from lying on your back to sitting on the side of a flat bed without using bedrails?: None Help needed moving to and from a bed to a chair (including a wheelchair)?: None Help needed standing up from a chair using your arms (e.g., wheelchair or bedside chair)?: A Little Help needed to walk in hospital room?: A Little Help needed climbing 3-5 steps with a railing? : A Little 6 Click Score: 21    End of Session Equipment Utilized During Treatment: Gait belt Activity Tolerance: Patient tolerated treatment well Patient left: in chair;with call bell/phone within reach;with chair alarm set Nurse Communication: Mobility status PT Visit Diagnosis: Other abnormalities of gait and mobility (R26.89)    Time: 1000-1020 PT Time Calculation (min) (ACUTE ONLY): 20 min   Charges:   PT Evaluation $PT Eval Low Complexity: [place], PT  Acute Rehab Dept ([place]) [phone] Pager [phone]  12/16/2020   [facility] 12/16/2020, 10:32 AM

## 2020-12-19 ENCOUNTER — Telehealth: Payer: Self-pay | Admitting: Orthopaedic Surgery

## 2020-12-19 ENCOUNTER — Other Ambulatory Visit: Payer: Self-pay | Admitting: Orthopaedic Surgery

## 2020-12-19 MED ORDER — OXYCODONE HCL 5 MG PO TABS: 5.0000 mg | ORAL_TABLET | ORAL | 0 refills | Status: DC | PRN

## 2020-12-19 NOTE — Telephone Encounter (Signed)
Please advise 

## 2020-12-19 NOTE — Telephone Encounter (Signed)
Patient called. She would like a refill for oxycodone called in. Her call back number is [phone]

## 2020-12-20 ENCOUNTER — Encounter (HOSPITAL_COMMUNITY): Payer: Self-pay | Admitting: Orthopaedic Surgery

## 2020-12-22 ENCOUNTER — Telehealth: Payer: Self-pay | Admitting: Orthopaedic Surgery

## 2020-12-22 MED ORDER — OXYCODONE HCL 5 MG PO TABS: 5.0000 mg | ORAL_TABLET | ORAL | 0 refills | Status: DC | PRN

## 2020-12-22 NOTE — Telephone Encounter (Signed)
Pt called stating she would like a refill of her oxycodone rx sent in please

## 2020-12-28 ENCOUNTER — Ambulatory Visit (INDEPENDENT_AMBULATORY_CARE_PROVIDER_SITE_OTHER): Payer: 59 | Admitting: Physician Assistant

## 2020-12-28 ENCOUNTER — Encounter: Payer: Self-pay | Admitting: Physician Assistant

## 2020-12-28 ENCOUNTER — Ambulatory Visit (INDEPENDENT_AMBULATORY_CARE_PROVIDER_SITE_OTHER): Payer: 59

## 2020-12-28 DIAGNOSIS — M25571 Pain in right ankle and joints of right foot: Secondary | ICD-10-CM

## 2020-12-28 DIAGNOSIS — Z419 Encounter for procedure for purposes other than remedying health state, unspecified: Secondary | ICD-10-CM

## 2020-12-28 MED ORDER — OXYCODONE HCL 5 MG PO TABS: 5.0000 mg | ORAL_TABLET | ORAL | 0 refills | Status: DC | PRN

## 2020-12-28 MED ORDER — METHOCARBAMOL 500 MG PO TABS: 500.0000 mg | ORAL_TABLET | Freq: Four times a day (QID) | ORAL | 1 refills | Status: DC | PRN

## 2020-12-28 NOTE — Progress Notes (Signed)
HPI: [name] returns today 2 weeks status post ORIF right ankle fracture.  She denies any chest pain shortness of breath.  She is taking oxycodone using Flexeril for the pain and muscle spasm.  She feels she is overall doing well.  She has been nonweightbearing in a short leg splint.  Physical exam: Right foot dorsal pedal pulse present.  Surgical incisions are well approximated with nylon sutures no signs of infection or wound dehiscence.  Right calf supple nontender.  She is able dorsiflex plantarflex the ankle.  Radiographs: 3 views right ankle: Shows talus well located.  Status post ORIF medial and lateral malleolus fractures.  Overall excellent position and alignment.  No hardware failure.  Impression: Status post open reduction internal fixation right trimalleolar ankle fracture.  Plan: She is placed in a cam walker boot she is nonweightbearing.  We will see her back in 4 weeks at that time obtain 3 views of the right ankle.  Sutures removed today Steri-Strips applied.  She will work on scar tissue mobilization.  Discussed with her that she is able to get the incision wet but not submerge it under water.  No driving.  Refill on pain medication and muscle relaxant was given.  Elevation wiggling toes encouraged.

## 2021-01-04 ENCOUNTER — Telehealth: Payer: Self-pay | Admitting: Physician Assistant

## 2021-01-04 MED ORDER — OXYCODONE HCL 5 MG PO TABS: 5.0000 mg | ORAL_TABLET | Freq: Four times a day (QID) | ORAL | 0 refills | Status: DC | PRN

## 2021-01-04 NOTE — Telephone Encounter (Signed)
Patient called needing Rx refilled Oxycodone. The number to contact patient is [phone]

## 2021-01-15 ENCOUNTER — Telehealth: Payer: Self-pay

## 2021-01-15 ENCOUNTER — Other Ambulatory Visit: Payer: Self-pay | Admitting: Orthopaedic Surgery

## 2021-01-15 MED ORDER — OXYCODONE HCL 5 MG PO TABS: 5.0000 mg | ORAL_TABLET | Freq: Four times a day (QID) | ORAL | 0 refills | Status: DC | PRN

## 2021-01-15 NOTE — Telephone Encounter (Signed)
Pt called and would like a refill on her oxycodone  

## 2021-01-25 ENCOUNTER — Ambulatory Visit (INDEPENDENT_AMBULATORY_CARE_PROVIDER_SITE_OTHER): Payer: 59 | Admitting: Physician Assistant

## 2021-01-25 ENCOUNTER — Ambulatory Visit (INDEPENDENT_AMBULATORY_CARE_PROVIDER_SITE_OTHER): Payer: 59

## 2021-01-25 ENCOUNTER — Other Ambulatory Visit: Payer: Self-pay

## 2021-01-25 ENCOUNTER — Encounter: Payer: Self-pay | Admitting: Physician Assistant

## 2021-01-25 DIAGNOSIS — Z419 Encounter for procedure for purposes other than remedying health state, unspecified: Secondary | ICD-10-CM

## 2021-01-25 DIAGNOSIS — M25571 Pain in right ankle and joints of right foot: Secondary | ICD-10-CM

## 2021-01-25 MED ORDER — OXYCODONE HCL 5 MG PO TABS
5.0000 mg | ORAL_TABLET | Freq: Four times a day (QID) | ORAL | 0 refills | Status: DC | PRN
Start: 2021-01-25 — End: 2021-02-13

## 2021-01-25 MED ORDER — DOXYCYCLINE HYCLATE 100 MG PO TABS: 100.0000 mg | ORAL_TABLET | Freq: Two times a day (BID) | ORAL | 0 refills | Status: AC

## 2021-01-25 NOTE — Progress Notes (Signed)
HPI: Nancy [name] returns today follow-up status post open reduction internal fixation right ankle fracture.  She is now approximately 6 weeks status post surgery.  She has been nonweightbearing.  She denies any fevers chills or calf pain.  She still has some pain about the ankle.  She is taking oxycodone occasionally.  Physical exam: Right foot dorsal pedal pulses present.  Surgical incision is healing well.  Proximal portion of the lateral incision there is a stitch that has spit some slight purulence.  Otherwise no signs of infection.  Stitch was removed.  Calf supple nontender.  Radiographs 3 views right ankle shows talus well located within the ankle mortise.  No hardware failure.  Good consolidation of the fracture sites.  Impression: Status post open reduction internal fixation right trimalleolar ankle fracture  Plan: At this point time she is weightbearing as tolerated in the boot.  We did place her on doxycycline 100 mg twice daily for 14 days.  She is on chronic doxycycline 40 mg daily for rosacea she will stop this while on the 100 mg and then once finishing the 100 mg go back to 40 mg.  We will see her back in 2 weeks just for an incision check.  Sooner if there is any questions concerns.  Refill on her oxycodone was given.

## 2021-01-26 ENCOUNTER — Telehealth: Payer: Self-pay | Admitting: Physician Assistant

## 2021-01-26 NOTE — Telephone Encounter (Signed)
Pt called to check in about her work note! I let her know it has been sent to the doctor just waiting for him!

## 2021-01-29 NOTE — Telephone Encounter (Signed)
Done

## 2021-02-08 ENCOUNTER — Encounter: Payer: Self-pay | Admitting: Physician Assistant

## 2021-02-08 ENCOUNTER — Ambulatory Visit (INDEPENDENT_AMBULATORY_CARE_PROVIDER_SITE_OTHER): Payer: 59 | Admitting: Physician Assistant

## 2021-02-08 DIAGNOSIS — S82851D Displaced trimalleolar fracture of right lower leg, subsequent encounter for closed fracture with routine healing: Secondary | ICD-10-CM

## 2021-02-08 NOTE — Progress Notes (Signed)
HPI: Ms. Opal Sidles returns today follow-up of her right ankle.  This is mostly for incision check.  She had a spit stitch in the lateral incision.  She is placed on doxycycline 100 mg twice daily for 14 days and at this point time is finished the antibiotic without any adverse effects.  She denies any fevers chills.  She still having significant pain taking cocci few times a day.  Physical exam: Right ankle she is able dorsiflex and plantarflex the ankle but with diminished range of motion.  The lateral incision is healing well there is no signs of any infection or exhibit stage.  Medial incision has a small split stitch but no signs of purulence.  This is removed today.  Patient tolerates well.  There is otherwise no dehiscence of either  Pain.  Impression: Status post open reduction internal fixation right trimalleolar ankle fracture 8 weeks postop  Plan: She will continue to wash the incisions with an antibacterial soap daily.  Placed her in an ASO brace weightbearing as tolerated.  Like to see her back in just 2 weeks for incision check but also obtain 3 views of the right ankle at that time.  Keep her out of work and reevaluate her work status in 2 weeks.  Questions were encouraged and answered.

## 2021-02-13 ENCOUNTER — Telehealth: Payer: Self-pay | Admitting: Orthopaedic Surgery

## 2021-02-13 ENCOUNTER — Other Ambulatory Visit: Payer: Self-pay | Admitting: Physician Assistant

## 2021-02-13 MED ORDER — OXYCODONE HCL 5 MG PO TABS: 5.0000 mg | ORAL_TABLET | Freq: Four times a day (QID) | ORAL | 0 refills | Status: AC | PRN

## 2021-02-13 NOTE — Telephone Encounter (Signed)
Called and advised.

## 2021-02-13 NOTE — Telephone Encounter (Signed)
Sent in

## 2021-02-13 NOTE — Telephone Encounter (Signed)
Pt needs a refill on oxycodone.

## 2021-02-22 ENCOUNTER — Ambulatory Visit (INDEPENDENT_AMBULATORY_CARE_PROVIDER_SITE_OTHER): Payer: 59 | Admitting: Physician Assistant

## 2021-02-22 ENCOUNTER — Other Ambulatory Visit: Payer: Self-pay

## 2021-02-22 ENCOUNTER — Ambulatory Visit (INDEPENDENT_AMBULATORY_CARE_PROVIDER_SITE_OTHER): Payer: 59

## 2021-02-22 ENCOUNTER — Encounter: Payer: Self-pay | Admitting: Physician Assistant

## 2021-02-22 DIAGNOSIS — S82851D Displaced trimalleolar fracture of right lower leg, subsequent encounter for closed fracture with routine healing: Secondary | ICD-10-CM

## 2021-02-22 NOTE — Progress Notes (Signed)
Office Visit Note   Patient: Nancy [name]           Date of Birth: Apr 25, 1961           MRN: [id number] Visit Date: 02/22/2021              Requested by: Tamsen Roers, PA-C [address] [place],  [place] [address] PCP: Tamsen Roers, PA-C   Assessment & Plan: Visit Diagnoses:  1. Closed trimalleolar fracture of right ankle with routine healing, subsequent encounter     Plan: I explained to her that she really needs to get out of the cam walker boot and start bearing weight on the right ankle.  She needs to go in the ASO brace.  Recommend she get into therapy to work on range of motion strengthening ankle they will wean her out of the ASO as proprioception and strength improved.  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  Follow-Up Instructions: Return in about 6 weeks (around 04/05/2021).   Orders:  Orders Placed This Encounter  Procedures  . XR Ankle Complete Right   No orders of the defined types were placed in this encounter.     Procedures: No procedures performed   Clinical Data: No additional findings.   Subjective: Chief Complaint  Patient presents with  . Right Ankle - Fracture, Follow-up    HPI Nancy [name] is now 10 weeks status post right ankle open reduction internal fixation.  She states overall she is doing little better.  She comes in today wearing the cam walker boot again.  She states she occasionally wears the ASO brace but mostly wears the cam walker boot.  She is ambulating using a walker.  Review of Systems See HPI otherwise negative  Objective: Vital Signs: There were no vitals taken for this visit.  Physical Exam Constitutional:      Appearance: She is not ill-appearing or diaphoretic.  Pulmonary:     Effort: Pulmonary effort is normal.  Neurological:     Mental Status: She is oriented to person, place, and time.  Psychiatric:        Mood and  Affect: Mood normal.     Ortho Exam Right ankle she has decreased dorsiflexion full plantarflexion.  Sensation grossly intact throughout the foot.  Dorsal pedal pulse intact.  Surgical incisions are healing well no signs of infection calf supple nontender.  Specialty Comments:  No specialty comments available.  Imaging: XR Ankle Complete Right  Result Date: 02/22/2021 3 views right ankle: No acute fracture.  Medial and lateral malleolus fractures are well healed.  No hardware failure.  Talus well located within the ankle versus a diastases.  Disuse osteopenia involving the hindfoot.    PMFS History: Patient Active Problem List   Diagnosis Date Noted  . Trimalleolar fracture of right ankle 12/15/2020  . Closed trimalleolar fracture of right ankle 12/14/2020  . Hyperlipidemia 12/13/2019  . S/P complete hysterectomy 01/29/2018  . Urinary retention 01/02/2018  . Allergic rhinitis 09/18/2015  . 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 Anxiety 05/09/2015  . CAFL (chronic airflow limitation) (HCC) 05/09/2015  . Clinical depression 05/09/2015  . Abnormal LFTs 05/09/2015  . Hydronephrosis 05/09/2015  . Adult hypothyroidism 05/09/2015  . AI (aortic incompetence) 05/09/2015  . D-dimer, elevated 05/09/2015  . 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 Avitaminosis D 05/09/2015  . Essential (primary) hypertension 05/09/2015  .  Aortic valve defect 05/09/2015  . Chronic obstructive pulmonary disease (HCC) 05/09/2015  . History of open heart surgery 11/22/2013  . 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 Aortic dissection (HCC) 07/03/2011   Past Medical History:  Diagnosis Date  . Aortic dissection (HCC) 2010  . COPD (chronic obstructive pulmonary disease) (HCC)   . Depression   . Hypertension   . Hypothyroidism   . Pneumonia   . PONV (postoperative nausea and vomiting)   . Rosacea   . Seasonal allergies   . Sleep apnea     Does not use CPAP    Family History  Problem Relation Age of Onset  . Pneumonia Mother   . Breast cancer Mother 28       twice 50's and 44  . Hypertension Father   . Heart disease Father   . Colon cancer Maternal Grandmother   . Heart disease Paternal Grandmother   . Healthy Sister   . Prostate cancer Neg Hx   . Kidney cancer Neg Hx     Past Surgical History:  Procedure Laterality Date  . ABDOMINAL HYSTERECTOMY  2007  . AORTIC VALVE REPLACEMENT  06/2009   repeated 2017  . APPENDECTOMY  1985  . BREAST BIOPSY Right 11/2020   benign  . BREAST CYST ASPIRATION     does not remember any details  . CARPAL TUNNEL RELEASE Left 04/12/2013   elbow  . CATARACT EXTRACTION Right 1994  . FOOT SURGERY Left 1997-1998  . ORIF ANKLE FRACTURE Right 12/15/2020   Procedure: OPEN REDUCTION INTERNAL FIXATION (ORIF) RIGHT TRIMALLEOLAR ANKLE FRACTURE;  Surgeon: [name], MD;  Location: [facility];  Service: Orthopedics;  Laterality: Right;   Social History   Occupational History    Employer: [employer]  Tobacco Use  . Smoking status: Former Smoker    Quit date: 03/11/2016    Years since quitting: 4.9  . Smokeless tobacco: Never Used  . Tobacco comment: patient states  that she is working at her own pace to quit smoking  Vaping Use  . Vaping Use: Former  Substance and Sexual Activity  . Alcohol use: Yes    Alcohol/week: 0.0 standard drinks    Comment: OCCASIONALLY  . Drug use: No  . Sexual activity: Not on file

## 2021-02-23 ENCOUNTER — Other Ambulatory Visit: Payer: Self-pay | Admitting: Family Medicine

## 2021-02-23 DIAGNOSIS — E038 Other specified hypothyroidism: Secondary | ICD-10-CM

## 2021-02-28 ENCOUNTER — Ambulatory Visit: Payer: Self-pay | Admitting: Dermatology

## 2021-03-16 ENCOUNTER — Other Ambulatory Visit: Payer: Self-pay | Admitting: Dermatology

## 2021-04-04 ENCOUNTER — Ambulatory Visit (INDEPENDENT_AMBULATORY_CARE_PROVIDER_SITE_OTHER): Payer: 59 | Admitting: Physician Assistant

## 2021-04-04 ENCOUNTER — Encounter: Payer: Self-pay | Admitting: Physician Assistant

## 2021-04-04 DIAGNOSIS — S82851D Displaced trimalleolar fracture of right lower leg, subsequent encounter for closed fracture with routine healing: Secondary | ICD-10-CM

## 2021-04-04 NOTE — Progress Notes (Signed)
HPI: [name] returns now 16 weeks status post open reduction internal fixation right ankle fracture.  She states she is overall trending towards improvement.  She is going to physical therapy.  She still not placing full weight on the ankle.  She is no longer wearing the brace due to the fact that she had swelling in the leg and this seemed to exacerbate swelling.  She is having no calf pain.  No shortness of breath.  Review of systems see HPI otherwise negative  Physical exam: Right ankle surgical incisions healing well no signs of infection.  Dorsiflexion plantarflexion ankle intact able to invert and evert foot.  Dorsal pedal pulses 2+.  Right calf supple nontender.  Slight tenderness over the medial malleolus.  Sensation grossly intact throughout the foot and ankle with no hyperparesthesia.  Impression: 16-week status post open reduction internal fixation right ankle fracture.  Plan: At this point time we will see her back in 6 weeks to see how she is doing as far as gait balance and placing full weight on the ankle.  We will obtain 3 views of the right ankle at that time to assess her disuse osteopenia.  Questions encouraged and answered.

## 2021-05-16 ENCOUNTER — Ambulatory Visit (INDEPENDENT_AMBULATORY_CARE_PROVIDER_SITE_OTHER): Payer: 59 | Admitting: Physician Assistant

## 2021-05-16 ENCOUNTER — Ambulatory Visit (INDEPENDENT_AMBULATORY_CARE_PROVIDER_SITE_OTHER): Payer: 59

## 2021-05-16 ENCOUNTER — Other Ambulatory Visit: Payer: Self-pay

## 2021-05-16 ENCOUNTER — Encounter: Payer: Self-pay | Admitting: Physician Assistant

## 2021-05-16 DIAGNOSIS — S82851D Displaced trimalleolar fracture of right lower leg, subsequent encounter for closed fracture with routine healing: Secondary | ICD-10-CM

## 2021-05-16 NOTE — Progress Notes (Signed)
HPI:  [name] returns today status post right ankle trimalleolar open reduction internal fixation.  He states overall her ankle pain is improving.  She states she has no real pain in the ankle and some soreness whenever she places weight on it.  She been going to physical therapy to work on gait balance and proprioception.  She is no longer wearing the brace.  She is using no assistive devices to ambulate.  Physical exam: Right ankle surgical incisions well-healed no signs of infection.  She has full dorsiflexion plantarflexion of the ankle.  5-5 strength with inversion eversion against resistance.  Dorsal pedal pulse 2+.  Nontender over the medial and lateral malleoli.  Moderate edema throughout the ankle.  Right calf supple nontender.   Radiographs: Right ankle 3 views: Talus well located within the ankle mortise.  No hardware failure.  Heterotopic calcification syndesmotic region. No acute fracture.  Disuse osteopenic changes particularly throughout the foot have improved.  Impression: 22-week status post open reduction internal fixation right ankle fracture.  Plan: Recommend that she continue to work on range of motion strengthening the ankle.  Wear compression stockings/sock to help with the edema.  Follow-up with [place] or she develops any mechanical symptoms or evidence of any significant change or increased pain

## 2021-05-31 ENCOUNTER — Other Ambulatory Visit: Payer: Self-pay | Admitting: Family Medicine

## 2021-05-31 DIAGNOSIS — E038 Other specified hypothyroidism: Secondary | ICD-10-CM

## 2021-06-07 ENCOUNTER — Other Ambulatory Visit: Payer: Self-pay

## 2021-06-07 ENCOUNTER — Ambulatory Visit (INDEPENDENT_AMBULATORY_CARE_PROVIDER_SITE_OTHER): Payer: Self-pay | Admitting: Dermatology

## 2021-06-07 DIAGNOSIS — L988 Other specified disorders of the skin and subcutaneous tissue: Secondary | ICD-10-CM

## 2021-06-07 NOTE — Progress Notes (Signed)
   Follow-Up Visit   Subjective  Nancy [name] is a 60 y.o. female who presents for the following: Facial Elastosis (Patient presents for filler to bilateral oral commissure today).  The following portions of the chart were reviewed this encounter and updated as appropriate:   Tobacco  Allergies  Meds  Problems  Med Hx  Surg Hx  Fam Hx     Review of Systems:  No other skin or systemic complaints except as noted in HPI or Assessment and Plan.  Objective  Well appearing patient in no apparent distress; mood and affect are within normal limits.  A focused examination was performed including face. Relevant physical exam findings are noted in the Assessment and Plan.  Face Rhytides and volume loss.    Assessment & Plan  Elastosis of skin Face  Filling material injection - Face Prior to the procedure, the patient's past medical history, allergies and the rare but potential risks and complications were reviewed with the patient and a signed consent was obtained. Pre and post-treatment care was discussed and instructions provided.  Location: marionette lines and chin  Filler Type: Restylane defyne Lot #[id number] Exp 07/11/2022  Procedure: The area was prepped thoroughly with Puracyn. After introducing the needle into the desired treatment area, the syringe plunger was drawn back to ensure there was no flash of blood prior to injecting the filler in order to minimize risk of intravascular injection and vascular occlusion. After injection of the filler, the treated areas were cleansed and iced to reduce swelling. Post-treatment instructions were reviewed with the patient.       Patient tolerated the procedure well. The patient will call with any problems, questions or concerns prior to their next appointment.   Return for Fillers ~6 months.  I, Joanie Coddington, CMA, am acting as scribe for Armida Sans, MD . Documentation: I have reviewed the above documentation for accuracy and  completeness, and I agree with the above.  Armida Sans, MD

## 2021-06-07 NOTE — Patient Instructions (Signed)

## 2021-06-12 ENCOUNTER — Encounter: Payer: Self-pay | Admitting: Dermatology

## 2021-06-28 ENCOUNTER — Other Ambulatory Visit: Payer: Self-pay | Admitting: Dermatology

## 2021-09-03 ENCOUNTER — Telehealth: Payer: Self-pay | Admitting: Family Medicine

## 2021-09-03 DIAGNOSIS — E038 Other specified hypothyroidism: Secondary | ICD-10-CM

## 2021-09-03 MED ORDER — LEVOTHYROXINE SODIUM 125 MCG PO TABS: 125.0000 ug | ORAL_TABLET | Freq: Every day | ORAL | 0 refills | Status: DC

## 2021-09-03 NOTE — Telephone Encounter (Signed)
[facility] faxed refill request for the following medications:  levothyroxine (SYNTHROID) 125 MCG tablet   Please advise.

## 2021-09-19 ENCOUNTER — Ambulatory Visit: Payer: Self-pay

## 2021-09-19 NOTE — Telephone Encounter (Signed)
Pt called stating that she has been having random dizzy spells x1 week. She states that she is not experiencing this Today. Please advise.   Pt. Reports over 1 week ago had "a dizzy spell and I fell." No injury. Last Friday had another episode and felt like the room was spinning. Had nausea with this, no other symptoms. Warm transfer to [facility] in the practice for appointment.    Reason for Disposition  [1] MODERATE dizziness (e.g., interferes with normal activities) AND [2] has NOT been evaluated by physician for this  (Exception: dizziness caused by heat exposure, sudden standing, or poor fluid intake)  Answer Assessment - Initial Assessment Questions 1. DESCRIPTION: "Describe your dizziness."     Dizzy 2. LIGHTHEADED: "Do you feel lightheaded?" (e.g., somewhat faint, woozy, weak upon standing)     Yes 3. VERTIGO: "Do you feel like either you or the room is spinning or tilting?" (i.e. vertigo)     Yes 4. SEVERITY: "How bad is it?"  "Do you feel like you are going to faint?" "Can you stand and walk?"   - MILD: Feels slightly dizzy, but walking normally.   - MODERATE: Feels unsteady when walking, but not falling; interferes with normal activities (e.g., school, work).   - SEVERE: Unable to walk without falling, or requires assistance to walk without falling; feels like passing out now.      Moderate 5. ONSET:  "When did the dizziness begin?"     Over 1 week ago 6. AGGRAVATING FACTORS: "Does anything make it worse?" (e.g., standing, change in head position)     No 7. HEART RATE: "Can you tell me your heart rate?" "How many beats in 15 seconds?"  (Note: not all patients can do this)       N/a 8. CAUSE: "What do you think is causing the dizziness?"     Unsure 9. RECURRENT SYMPTOM: "Have you had dizziness before?" If Yes, ask: "When was the last time?" "What happened that time?"     No 10. OTHER SYMPTOMS: "Do you have any other symptoms?" (e.g., fever, chest pain, vomiting, diarrhea,  bleeding)       Nausea 11. PREGNANCY: "Is there any chance you are pregnant?" "When was your last menstrual period?"       No  Protocols used: Dizziness - Lightheadedness-A-AH

## 2021-09-21 ENCOUNTER — Encounter: Payer: Self-pay | Admitting: Family Medicine

## 2021-09-21 ENCOUNTER — Other Ambulatory Visit: Payer: Self-pay

## 2021-09-21 ENCOUNTER — Ambulatory Visit (INDEPENDENT_AMBULATORY_CARE_PROVIDER_SITE_OTHER): Payer: 59 | Admitting: Family Medicine

## 2021-09-21 VITALS — BP 110/67 | HR 82 | Temp 98.2°F | Wt 203.0 lb

## 2021-09-21 DIAGNOSIS — R110 Nausea: Secondary | ICD-10-CM | POA: Diagnosis not present

## 2021-09-21 DIAGNOSIS — F439 Reaction to severe stress, unspecified: Secondary | ICD-10-CM

## 2021-09-21 DIAGNOSIS — R5383 Other fatigue: Secondary | ICD-10-CM

## 2021-09-21 DIAGNOSIS — R270 Ataxia, unspecified: Secondary | ICD-10-CM | POA: Diagnosis not present

## 2021-09-21 DIAGNOSIS — Z1231 Encounter for screening mammogram for malignant neoplasm of breast: Secondary | ICD-10-CM

## 2021-09-21 DIAGNOSIS — G459 Transient cerebral ischemic attack, unspecified: Secondary | ICD-10-CM

## 2021-09-21 MED ORDER — ALPRAZOLAM 0.5 MG PO TABS: 0.5000 mg | ORAL_TABLET | Freq: Two times a day (BID) | ORAL | 2 refills | Status: DC | PRN

## 2021-09-21 MED ORDER — ONDANSETRON 4 MG PO TBDP: 4.0000 mg | ORAL_TABLET | Freq: Three times a day (TID) | ORAL | 0 refills | Status: DC | PRN

## 2021-09-21 NOTE — Progress Notes (Addendum)
Established patient visit   Patient: Nancy [name]   DOB: 06-19-1961   60 y.o. Female  MRN: [id number] Visit Date: 09/21/2021  Today's healthcare provider: Lelon Huh, MD   Chief Complaint  Patient presents with   Dizziness   Subjective    Dizziness This is a new problem. The current episode started 1 to 4 weeks ago. The problem has been waxing and waning (has had two episodes the last week). Associated symptoms include fatigue, nausea (only when having epiosde of vertigo) and vertigo. Pertinent negatives include no abdominal pain, headaches, numbness, sore throat or vomiting. Nothing aggravates the symptoms. Treatments tried: tried zoran and rest. The treatment provided no relief.  First episode was about 2 weeks ago, she stood up and starting walking out of a Church when she fel her left siding getting heavier and heavier until she was no longer able to stand up, and fell down the the left. It was 20-30 minutes before symptoms resolved.   She has a different episode one week ago when she was sitting in chair and suddenly felt like the room was spinning around her and felt very nauseated. This episode last several hours. Spinning sensation has resolved but she continues to have dizziness at times.   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 She has been very anxious and nervous about her health over the last week or so having trouble sleeping and staying focused.       Medications: Outpatient Medications Prior to Visit  Medication Sig   albuterol-ipratropium (COMBIVENT) 18-103 MCG/ACT inhaler Inhale 2 puffs into the lungs every 6 (six) hours as needed for wheezing or shortness of breath.   amLODipine (NORVASC) 5 MG tablet Take 5 mg by mouth daily.   budesonide-formoterol (SYMBICORT) 160-4.5 MCG/ACT inhaler Inhale 2 puffs into the lungs daily.    carvedilol (COREG) 25 MG tablet Take 25 mg by  mouth 2 (two) times daily.   doxycycline (ORACEA) 40 MG capsule TAKE 1 CAPSULE BY MOUTH ONCE DAILY WITH FOOD   furosemide (LASIX) 40 MG tablet Take 40 mg by mouth daily.   levothyroxine (SYNTHROID) 125 MCG tablet Take 1 tablet (125 mcg total) by mouth daily before breakfast. Please schedule an office visit before anymore refills.   methocarbamol (ROBAXIN) 500 MG tablet Take 1 tablet (500 mg total) by mouth every 6 (six) hours as needed for muscle spasms.   ondansetron (ZOFRAN ODT) 4 MG disintegrating tablet Take 1 tablet (4 mg total) by mouth every 8 (eight) hours as needed for nausea or vomiting.   oxyCODONE (ROXICODONE) 5 MG immediate release tablet Take 1-2 tablets (5-10 mg total) by mouth every 6 (six) hours as needed.   warfarin (COUMADIN) 2.5 MG tablet Take 5 mg by mouth daily.   Facility-Administered Medications Prior to Visit  Medication Dose Route Frequency Provider   ipratropium-albuterol (DUONEB) 0.5-2.5 (3) MG/3ML nebulizer solution 3 mL  3 mL Nebulization Once Terrilee Croak, Adriana M, PA-C    Review of Systems  Constitutional:  Positive for fatigue.  HENT:  Negative for ear discharge, ear pain, rhinorrhea, sinus pressure, sinus pain, sneezing, sore throat and tinnitus.   Eyes: Negative.  Negative for pain, discharge, redness, itching and visual disturbance.  Gastrointestinal:  Positive for nausea (only when having epiosde of vertigo). Negative for abdominal distention, abdominal pain, anal bleeding, blood in stool, constipation, diarrhea, rectal pain and vomiting.  Endocrine: Negative.  Neurological:  Positive for dizziness and vertigo. Negative for seizures, facial asymmetry, speech difficulty, light-headedness, numbness and headaches.  Psychiatric/Behavioral:  The patient is nervous/anxious.       Objective    BP 110/67 (BP Location: Right Arm, Patient Position: Sitting, Cuff Size: Normal)   Pulse 82   Temp 98.2 F (36.8 C) (Oral)   Wt 203 lb (92.1 kg)   SpO2 96%   BMI 31.79  kg/m   Physical Exam   General: Appearance:    Mildly obese female in no acute distress  Eyes:    PERRL, conjunctiva/corneas clear, EOM's intact       Lungs:     Clear to auscultation bilaterally, respirations unlabored  Heart:    Normal heart rate. Normal rhythm.  Mechanical aortic valve click.     MS:   All extremities are intact.    Neurologic:   Awake, alert, oriented x 3. No apparent focal neurological defect. Normal balance and gain. Negative rhomberg. Normal cerebellar functions. No nystagmus.         Assessment & Plan     1. Ataxia Now resolved. Considering history of thoracic aorta dissection there is increased risk for other vascular disorders including cerebral vascular disease.   - MR Angiogram Head Wo Contrast; Future  2. Nausea RF ondansetron.   3. TIA (transient ischemic attack) (Suspected) - MR Angiogram Head Wo Contrast; Future  4. Situational stress  - ALPRAZolam (XANAX) 0.5 MG tablet; Take 1 tablet (0.5 mg total) by mouth 2 (two) times daily as needed for anxiety.  Dispense: 20 tablet; Refill: 2  5. Other fatigue  - CBC - Comprehensive metabolic panel - TSH - T4, free - Vitamin B12 - MR Angiogram Head Wo Contrast; Future  6. Encounter for screening mammogram for malignant neoplasm of breast  - MM 3D SCREEN BREAST BILATERAL; Future      The entirety of the information documented in the History of Present Illness, Review of Systems and Physical Exam were personally obtained by me. Portions of this information were initially documented by the CMA and reviewed by me for thoroughness and accuracy.     Mila Merry, MD  [facility] [phone] (phone) [phone] (fax)  [facility]

## 2021-09-25 ENCOUNTER — Encounter: Payer: Self-pay | Admitting: Family Medicine

## 2021-09-26 ENCOUNTER — Encounter: Payer: Self-pay | Admitting: Family Medicine

## 2021-09-26 LAB — CBC
Hematocrit: 43.1 % (ref 34.0–46.6)
Hemoglobin: 14.7 g/dL (ref 11.1–15.9)
MCH: 31.1 pg (ref 26.6–33.0)
MCHC: 34.1 g/dL (ref 31.5–35.7)
MCV: 91 fL (ref 79–97)
Platelets: 224 10*3/uL (ref 150–450)
RBC: 4.73 x10E6/uL (ref 3.77–5.28)
RDW: 11.9 % (ref 11.7–15.4)
WBC: 8.5 10*3/uL (ref 3.4–10.8)

## 2021-09-26 LAB — COMPREHENSIVE METABOLIC PANEL
ALT: 13 IU/L (ref 0–32)
AST: 23 IU/L (ref 0–40)
Albumin/Globulin Ratio: 1.8 (ref 1.2–2.2)
Albumin: 4.6 g/dL (ref 3.8–4.9)
Alkaline Phosphatase: 138 IU/L — ABNORMAL HIGH (ref 44–121)
BUN/Creatinine Ratio: 15 (ref 9–23)
BUN: 13 mg/dL (ref 6–24)
Bilirubin Total: 0.7 mg/dL (ref 0.0–1.2)
CO2: 26 mmol/L (ref 20–29)
Calcium: 9.5 mg/dL (ref 8.7–10.2)
Chloride: 100 mmol/L (ref 96–106)
Creatinine, Ser: 0.85 mg/dL (ref 0.57–1.00)
Globulin, Total: 2.6 g/dL (ref 1.5–4.5)
Glucose: 84 mg/dL (ref 70–99)
Potassium: 3.5 mmol/L (ref 3.5–5.2)
Sodium: 141 mmol/L (ref 134–144)
Total Protein: 7.2 g/dL (ref 6.0–8.5)
eGFR: 79 mL/min/{1.73_m2} (ref >59)

## 2021-09-26 LAB — VITAMIN B12: Vitamin B-12: 1618 pg/mL — ABNORMAL HIGH (ref 232–1245)

## 2021-09-26 LAB — TSH: TSH: 2.57 u[IU]/mL (ref 0.450–4.500)

## 2021-09-26 LAB — T4, FREE: Free T4: 1.43 ng/dL (ref 0.82–1.77)

## 2021-09-26 MED ORDER — SERTRALINE HCL 25 MG PO TABS: ORAL_TABLET | ORAL | 1 refills | Status: DC

## 2021-09-27 ENCOUNTER — Ambulatory Visit
Admission: RE | Admit: 2021-09-27 | Discharge: 2021-09-27 | Disposition: A | Discharge status: Home / Self Care | Payer: 59 | Source: Ambulatory Visit | Attending: Family Medicine | Admitting: Family Medicine

## 2021-09-27 DIAGNOSIS — R5383 Other fatigue: Secondary | ICD-10-CM | POA: Diagnosis present

## 2021-09-27 DIAGNOSIS — G459 Transient cerebral ischemic attack, unspecified: Secondary | ICD-10-CM | POA: Insufficient documentation

## 2021-09-27 DIAGNOSIS — R270 Ataxia, unspecified: Secondary | ICD-10-CM | POA: Diagnosis present

## 2021-10-01 ENCOUNTER — Other Ambulatory Visit: Payer: Self-pay | Admitting: Family Medicine

## 2021-10-01 ENCOUNTER — Telehealth: Payer: Self-pay

## 2021-10-01 DIAGNOSIS — E038 Other specified hypothyroidism: Secondary | ICD-10-CM

## 2021-10-01 DIAGNOSIS — I779 Disorder of arteries and arterioles, unspecified: Secondary | ICD-10-CM

## 2021-10-01 NOTE — Telephone Encounter (Signed)
-----   Message from Malva Limes, MD sent at 09/29/2021  8:21 AM EST ----- MRA is normal. No signs of any aneurysms of brain. If she has any more episodes would recommend seeing neurologist.  There is an outpouching of carotid artery (infundibulum) which is not dangerous, but sometimes progress to aneurysms. Recommend referral to vascular surgery to follow this.

## 2021-10-01 NOTE — Telephone Encounter (Signed)
Patient advised and states she would like to proceed with Vascular referral. She wants to know if Dr. Sherrie Mustache recommends a specific vascular specialist? Patient wants to go to whoever Dr. Sherrie Mustache recommends. Referral order pending in this encounter. Please advise on which specialist your recommend.

## 2021-10-15 ENCOUNTER — Encounter: Payer: Self-pay | Admitting: Family Medicine

## 2021-10-15 DIAGNOSIS — F439 Reaction to severe stress, unspecified: Secondary | ICD-10-CM

## 2021-10-16 MED ORDER — SERTRALINE HCL 50 MG PO TABS
50.0000 mg | ORAL_TABLET | Freq: Every day | ORAL | 1 refills | Status: DC
Start: 2021-10-16 — End: 2021-11-27

## 2021-10-16 NOTE — Telephone Encounter (Signed)
Patient is requesting a new prescription with new instructions stating that she is to take 2 tablets daily of Sertraline 25mg  tablet. Please advise of ok to send in new prescription with updated directions and increased qty.

## 2021-10-26 ENCOUNTER — Other Ambulatory Visit: Payer: Self-pay | Admitting: Family Medicine

## 2021-10-26 DIAGNOSIS — F3342 Major depressive disorder, recurrent, in full remission: Secondary | ICD-10-CM

## 2021-10-26 MED ORDER — VENLAFAXINE HCL ER 37.5 MG PO CP24: 37.5000 mg | ORAL_CAPSULE | Freq: Every day | ORAL | 1 refills | Status: DC

## 2021-11-08 ENCOUNTER — Other Ambulatory Visit: Payer: Self-pay

## 2021-11-08 MED ORDER — DOXYCYCLINE HYCLATE 20 MG PO TABS: 20.0000 mg | ORAL_TABLET | Freq: Two times a day (BID) | ORAL | 2 refills | Status: DC

## 2021-11-08 NOTE — Progress Notes (Signed)
Doxycycline 40mg  denied. Doxycycline 20mg  BID sent in as replacement. Patient advised of change.

## 2021-11-11 HISTORY — PX: OTHER SURGICAL HISTORY: SHX169

## 2021-11-15 ENCOUNTER — Telehealth: Payer: Self-pay | Admitting: Family Medicine

## 2021-11-15 NOTE — Telephone Encounter (Signed)
Patient called in says she was told from referral [facility] that she needs to see Dr Marcell Barlow, she believes at [facility]. Please call back regarding this.

## 2021-11-16 NOTE — Telephone Encounter (Signed)
Pt called in to follow up on referral. Advised pt that message has been communicated to provider for further assistance.

## 2021-11-16 NOTE — Telephone Encounter (Signed)
I don't see any documentation from [facility] about seeing Dr. Marcell Barlow. We can't do a referral without knowing the medical reason..... maybe she needs to have [facility] call here and explain what the referral is for.

## 2021-11-20 NOTE — Telephone Encounter (Signed)
I called and spoke with patient advising her of message below. Patient agrees to have [facility] call us to explain the reason for the referral.

## 2021-11-21 ENCOUNTER — Other Ambulatory Visit: Payer: Self-pay | Admitting: Family Medicine

## 2021-11-21 DIAGNOSIS — I671 Cerebral aneurysm, nonruptured: Secondary | ICD-10-CM

## 2021-11-21 NOTE — Progress Notes (Signed)
Received message from Sheppard Plumber, NP stating patient needs referral to neurosurgery for possible intracranial aneurysm (left ICA).

## 2021-11-26 ENCOUNTER — Ambulatory Visit: Payer: Self-pay

## 2021-11-26 NOTE — Telephone Encounter (Signed)
°  Chief Complaint: nausea/fatigue Symptoms: nausea since 09/21/21 Frequency: daily since November Pertinent Negatives: Patient denies vomiting, dehydration, dizziness Disposition: [] ED /[] Urgent Care (no appt availability in office) / [x] Appointment(In office/virtual)/ []  [facility]/ [] Home Care/ [] Refused Recommended Disposition /[] [facility]/ [x]  Follow-up with PCP Additional Notes: Pt tearful during triage. Pt stated she thinks it could be the Effexor and/or Zoloft. Pt given VV tomorrow [id number]. Advised pt she can use MyChart to do a VV today.        Reason for Disposition  Taking prescription medication that could cause nausea (e.g., narcotics/opiates, antibiotics, OCPs, many others)  Answer Assessment - Initial Assessment Questions 1. NAUSEA SEVERITY: "How bad is the nausea?" (e.g., mild, moderate, severe; dehydration, weight loss)   - MILD: loss of appetite without change in eating habits   - MODERATE: decreased oral intake without significant weight loss, dehydration, or malnutrition   - SEVERE: inadequate caloric or fluid intake, significant weight loss, symptoms of dehydration     Drinking only  2. ONSET: "When did the nausea begin?"     Since the Effexor 3. VOMITING: "Any vomiting?" If Yes, ask: "How many times today?"     Nausea all the time  4. RECURRENT SYMPTOM: "Have you had nausea before?" If Yes, ask: "When was the last time?" "What happened that time?"     yes 5. CAUSE: "What do you think is causing the nausea?"     Effexor  Protocols used: Nausea-A-AH

## 2021-11-26 NOTE — Progress Notes (Signed)
MyChart Video Visit    Virtual Visit via Video Note   This visit type was conducted due to national recommendations for restrictions regarding the COVID-19 Pandemic (e.g. social distancing) in an effort to limit this patient's exposure and mitigate transmission in our community. This patient is at least at moderate risk for complications without adequate follow up. This format is felt to be most appropriate for this patient at this time. Physical exam was limited by quality of the video and audio technology used for the visit.   Patient location: home Provider location: [facility] I discussed the limitations of evaluation and management by telemedicine and the availability of in person appointments. The patient expressed understanding and agreed to proceed.  Patient: Nancy [name]   DOB: [birth year]   61 y.o. Female  MRN: [id number] Visit Date: 11/27/2021  Today's healthcare provider: Jacky Kindle, FNP   Chief Complaint  Patient presents with   Nausea    Patient reports today with concerns of nausea for the past 5-6 weeks, patient reports within the past 6 weeks she was started on new medication by Dr. Sherrie Mustache ( Effexor). Patient states that she has tried taking otc Dramamine and prescription Zofran with little relief.    Dizziness    Patient complains of episodes of dizziness and fatigue since November that  has been intermittent.    Subjective    HPI HPI     Nausea    Additional comments: Patient reports today with concerns of nausea for the past 5-6 weeks, patient reports within the past 6 weeks she was started on new medication by Dr. Sherrie Mustache ( Effexor). Patient states that she has tried taking otc Dramamine and prescription Zofran with little relief.         Dizziness    Additional comments: Patient complains of episodes of dizziness and fatigue since November that  has been intermittent.       Last edited by Fonda Kinder, CMA on 11/27/2021 10:40  AM.        Medications: Outpatient Medications Prior to Visit  Medication Sig   albuterol (VENTOLIN HFA) 108 (90 Base) MCG/ACT inhaler SMARTSIG:2 Inhalation Via Inhaler Every 6 Hours PRN   albuterol-ipratropium (COMBIVENT) 18-103 MCG/ACT inhaler Inhale 2 puffs into the lungs every 6 (six) hours as needed for wheezing or shortness of breath.   amLODipine (NORVASC) 5 MG tablet Take 5 mg by mouth daily.   budesonide-formoterol (SYMBICORT) 160-4.5 MCG/ACT inhaler Inhale 2 puffs into the lungs daily.    carvedilol (COREG) 25 MG tablet Take 25 mg by mouth 2 (two) times daily.   doxycycline (ORACEA) 40 MG capsule TAKE 1 CAPSULE BY MOUTH ONCE DAILY WITH FOOD   doxycycline (PERIOSTAT) 20 MG tablet Take 1 tablet (20 mg total) by mouth 2 (two) times daily.   Fluticasone-Umeclidin-Vilant 100-62.5-25 MCG/ACT AEPB SMARTSIG:1 Inhalation Via Inhaler Daily   furosemide (LASIX) 40 MG tablet Take 40 mg by mouth daily.   levothyroxine (SYNTHROID) 125 MCG tablet Take 1 tablet (125 mcg total) by mouth daily before breakfast.   methocarbamol (ROBAXIN) 500 MG tablet Take 1 tablet (500 mg total) by mouth every 6 (six) hours as needed for muscle spasms.   oxyCODONE (ROXICODONE) 5 MG immediate release tablet Take 1-2 tablets (5-10 mg total) by mouth every 6 (six) hours as needed.   topiramate (TOPAMAX) 50 MG tablet Take 50 mg by mouth 2 (two) times daily.   warfarin (COUMADIN) 2.5 MG tablet Take 5 mg by  mouth daily.   [DISCONTINUED] ALPRAZolam (XANAX) 0.5 MG tablet Take 1 tablet (0.5 mg total) by mouth 2 (two) times daily as needed for anxiety.   [DISCONTINUED] ondansetron (ZOFRAN ODT) 4 MG disintegrating tablet Take 1 tablet (4 mg total) by mouth every 8 (eight) hours as needed for nausea or vomiting.   [DISCONTINUED] sertraline (ZOLOFT) 50 MG tablet Take 1 tablet (50 mg total) by mouth daily.   [DISCONTINUED] venlafaxine XR (EFFEXOR XR) 37.5 MG 24 hr capsule Take 1 capsule (37.5 mg total) by mouth daily with  breakfast.   Facility-Administered Medications Prior to Visit  Medication Dose Route Frequency Provider   ipratropium-albuterol (DUONEB) 0.5-2.5 (3) MG/3ML nebulizer solution 3 mL  3 mL Nebulization Once Pollak, Adriana M, PA-C    Review of Systems    Objective    There were no vitals taken for this visit.   Physical Exam Vitals and nursing note reviewed.  Constitutional:      General: She is not in acute distress.    Appearance: Normal appearance. She is overweight. She is not ill-appearing, toxic-appearing or diaphoretic.  HENT:     Head: Normocephalic and atraumatic.  Cardiovascular:     Rate and Rhythm: Normal rate and regular rhythm.     Pulses: Normal pulses.     Heart sounds: Normal heart sounds. No murmur heard.   No friction rub. No gallop.  Pulmonary:     Effort: Pulmonary effort is normal. No respiratory distress.     Breath sounds: Normal breath sounds. No stridor. No wheezing, rhonchi or rales.  Chest:     Chest wall: No tenderness.  Abdominal:     General: Bowel sounds are normal.     Palpations: Abdomen is soft.  Musculoskeletal:        General: No swelling, tenderness, deformity or signs of injury. Normal range of motion.     Right lower leg: No edema.     Left lower leg: No edema.  Skin:    General: Skin is warm and dry.     Capillary Refill: Capillary refill takes less than 2 seconds.     Coloration: Skin is not jaundiced or pale.     Findings: No bruising, erythema, lesion or rash.  Neurological:     General: No focal deficit present.     Mental Status: She is alert and oriented to person, place, and time. Mental status is at baseline.     Cranial Nerves: No cranial nerve deficit.     Sensory: No sensory deficit.     Motor: No weakness.     Coordination: Coordination normal.  Psychiatric:        Mood and Affect: Mood normal.        Behavior: Behavior normal.        Thought Content: Thought content normal.        Judgment: Judgment normal.     Encouraged to call specialists and seek closer appt times given ongoing symptoms as well as seek out additional medical supply store to seek CPAP supplies.   Assessment & Plan     Problem List Items Addressed This Visit       Cardiovascular and Mediastinum   Brain aneurysm    2 mm outpouching seen on exam in 09/2021; has not established with neuro Symptoms have worsened- depression, nausea, weight loss, etc Referral re-established       Relevant Orders   Ambulatory referral to Neurosurgery     Respiratory   Chronic obstructive pulmonary  disease (HCC)    Chronic, prev well controlled  Has follow up 2/23; encouraged to address c/o fatigue Re-est need for CPAP- hasn't been wearing d/t mask issues      OSA and COPD overlap syndrome (HCC)   Relevant Orders   PSG Sleep Study   COPD with acute exacerbation (HCC)     Other   Mechanical heart valve present    Chronic, prev well controlled  Encouraged to seek return to cards before 4/23 given c/o fatigue      Fatigue due to depression    Has to convince herself to eat/go to the store/ go to work/ get dressed Denies SI/HI I've explained to her that drugs of the SSRI class can have side effects such as weight gain, sexual dysfunction, insomnia, headache, nausea. These medications are generally effective at alleviating symptoms of anxiety and/or depression. Let me know if significant side effects do occur. Will Start Cymbalta 40 mg daily Discussed potential side effects, incl GI upset, sexual dysfunction, increased anxiety, and SI Discussed that it can take 6-8 weeks to reach full efficacy Contracted for safety - no SI/HI Discussed synergistic effects of medications and therapy       Nausea    Refill of zofran per request Provide ativan as well for uncontrolled nausea given weight loss, non intentional       Relevant Medications   LORazepam (ATIVAN) 0.5 MG tablet   ondansetron (ZOFRAN ODT) 4 MG disintegrating tablet    Depression, recurrent (HCC) - Primary    Does not feel that she has 'depression' Describes her symptoms as 'lack of energy' Started previous on B12 supplements-  Did not help, labs were supra therapeteutic in 09/2021; since has stopped Sleeps 10-11 hrs/night and wants to lie on couch Change to non trad dep medication given ongoing nausea complaints      Relevant Medications   LORazepam (ATIVAN) 0.5 MG tablet   DULoxetine (CYMBALTA) 20 MG capsule   Other Relevant Orders   AMB Referral to Community Care Coordinaton   Weight loss, unintentional    Close to 20# weight loss Self reported weight of 186# today Ref to hem/onc      Relevant Orders   Ambulatory referral to Hematology / Oncology     Return in about 3 months (around 02/25/2022) for anxiety and depression.     I discussed the assessment and treatment plan with the patient. The patient was provided an opportunity to ask questions and all were answered. The patient agreed with the plan and demonstrated an understanding of the instructions.   The patient was advised to call back or seek an in-person evaluation if the symptoms worsen or if the condition fails to improve as anticipated.  I provided 28 minutes of non-face-to-face time during this encounter.  Leilani MerlI, Malek Skog T Hanadi Stanly, FNP, have reviewed all documentation for this visit. The documentation on 11/27/21 for the exam, diagnosis, procedures, and orders are all accurate and complete.  Patient seen and examined by Merita NortonElise Khaliyah Northrop,  FNP note scribed by Sheliah HatchKathleen Wolford, NCMA  Jacky KindleElise T Rylan Bernard, FNP [facility] [phone] (phone) [phone] (fax)  [facility]

## 2021-11-27 ENCOUNTER — Telehealth (INDEPENDENT_AMBULATORY_CARE_PROVIDER_SITE_OTHER): Payer: BC Managed Care – PPO | Admitting: Family Medicine

## 2021-11-27 ENCOUNTER — Encounter: Payer: Self-pay | Admitting: Family Medicine

## 2021-11-27 ENCOUNTER — Other Ambulatory Visit: Payer: Self-pay

## 2021-11-27 ENCOUNTER — Ambulatory Visit: Payer: Self-pay | Admitting: *Deleted

## 2021-11-27 DIAGNOSIS — R110 Nausea: Secondary | ICD-10-CM | POA: Diagnosis not present

## 2021-11-27 DIAGNOSIS — I671 Cerebral aneurysm, nonruptured: Secondary | ICD-10-CM | POA: Diagnosis not present

## 2021-11-27 DIAGNOSIS — J42 Unspecified chronic bronchitis: Secondary | ICD-10-CM

## 2021-11-27 DIAGNOSIS — F339 Major depressive disorder, recurrent, unspecified: Secondary | ICD-10-CM

## 2021-11-27 DIAGNOSIS — F32A Depression, unspecified: Secondary | ICD-10-CM

## 2021-11-27 DIAGNOSIS — J441 Chronic obstructive pulmonary disease with (acute) exacerbation: Secondary | ICD-10-CM

## 2021-11-27 DIAGNOSIS — J449 Chronic obstructive pulmonary disease, unspecified: Secondary | ICD-10-CM

## 2021-11-27 DIAGNOSIS — R5383 Other fatigue: Secondary | ICD-10-CM

## 2021-11-27 DIAGNOSIS — G4733 Obstructive sleep apnea (adult) (pediatric): Secondary | ICD-10-CM

## 2021-11-27 DIAGNOSIS — R634 Abnormal weight loss: Secondary | ICD-10-CM

## 2021-11-27 DIAGNOSIS — Z952 Presence of prosthetic heart valve: Secondary | ICD-10-CM

## 2021-11-27 MED ORDER — LORAZEPAM 0.5 MG PO TABS
0.5000 mg | ORAL_TABLET | Freq: Three times a day (TID) | ORAL | 0 refills | Status: DC | PRN
Start: 2021-11-27 — End: 2023-01-29

## 2021-11-27 MED ORDER — ONDANSETRON 4 MG PO TBDP: 4.0000 mg | ORAL_TABLET | Freq: Three times a day (TID) | ORAL | 2 refills | Status: DC | PRN

## 2021-11-27 MED ORDER — DULOXETINE HCL 20 MG PO CPEP: 40.0000 mg | ORAL_CAPSULE | Freq: Every day | ORAL | 1 refills | Status: DC

## 2021-11-27 NOTE — Assessment & Plan Note (Signed)
Does not feel that she has 'depression' Describes her symptoms as 'lack of energy' Started previous on B12 supplements-  Did not help, labs were supra therapeteutic in 09/2021; since has stopped Sleeps 10-11 hrs/night and wants to lie on couch Change to non trad dep medication given ongoing nausea complaints

## 2021-11-27 NOTE — Assessment & Plan Note (Signed)
Chronic, prev well controlled  Encouraged to seek return to cards before 4/23 given c/o fatigue

## 2021-11-27 NOTE — Assessment & Plan Note (Addendum)
2 mm outpouching seen on exam in 09/2021; has not established with neuro Symptoms have worsened- depression, nausea, weight loss, etc Referral re-established

## 2021-11-27 NOTE — Assessment & Plan Note (Signed)
Refill of zofran per request Provide ativan as well for uncontrolled nausea given weight loss, non intentional

## 2021-11-27 NOTE — Assessment & Plan Note (Signed)
Chronic, prev well controlled  Has follow up 2/23; encouraged to address c/o fatigue Re-est need for CPAP- hasn't been wearing d/t mask issues

## 2021-11-27 NOTE — Telephone Encounter (Signed)
Summary: medication question   Patient called in, says was switched from effexor medication  to cymbalta and she wants to know how to start the new medication and when.     Per MyChart- message: Jonathon Bellows,   Here is an example of a suggested SSRI wean from Effexor, 75 mg.   Removing 75 mg each time.   This week 1/17-1/21, SKIP 1/18 Next week, 1/22-1/28, SKIP 1/24, 1/26 1/29-2/4, SKIP 1/30, 2/1, 2/3 2/5-2/11. SKIP 2/5, 2/7, 2/9, 2/11 2/12-2/18: SKIP 2/12, 2/14, 2/16, 2/18   2/19-2/25: TAKE 2/21, 2/23 2/26-3/4: TAKE 3/1   OK to continue 1 dose (75 mg) per week until you start to see effect from new medication; Cymbalta 2-20mg  capsules (40mg  total).   Thank you for your time,   Daneil Dan FNP Reason for Disposition  Caller has medicine question only, adult not sick, AND triager answers question  Answer Assessment - Initial Assessment Questions 1. NAME of MEDICATION: "What medicine are you calling about?"     Stopping Effexor and starting Cymbalta 2. QUESTION: "What is your question?" (e.g., double dose of medicine, side effect)     How to switch over 3. PRESCRIBING HCP: "Who prescribed it?" Reason: if prescribed by specialist, call should be referred to that group.     PCP  Protocols used: Medication Question Call-A-AH

## 2021-11-27 NOTE — Assessment & Plan Note (Signed)
Has to convince herself to eat/go to the store/ go to work/ get dressed Denies SI/HI I've explained to her that drugs of the SSRI class can have side effects such as weight gain, sexual dysfunction, insomnia, headache, nausea. These medications are generally effective at alleviating symptoms of anxiety and/or depression. Let me know if significant side effects do occur. Will Start Cymbalta 40 mg daily Discussed potential side effects, incl GI upset, sexual dysfunction, increased anxiety, and SI Discussed that it can take 6-8 weeks to reach full efficacy Contracted for safety - no SI/HI Discussed synergistic effects of medications and therapy

## 2021-11-27 NOTE — Assessment & Plan Note (Signed)
Close to 20# weight loss Self reported weight of 186# today Ref to hem/onc

## 2021-11-28 ENCOUNTER — Telehealth: Payer: Self-pay | Admitting: *Deleted

## 2021-11-28 NOTE — Chronic Care Management (AMB) (Signed)
°  Care Management   Outreach Note  11/28/2021 Name: Nancy [name] MRN: [id number] DOB: Jun 20, 1961  Referred by: Tamsen Roers, PA-C (Inactive) Reason for referral : Care Coordination (Initial outreach to schedule referral with Licensed Clinical SW)   An unsuccessful telephone outreach was attempted today. The patient was referred to the case management team for assistance with care management and care coordination.   Follow Up Plan:  A HIPAA compliant phone message was left for the patient providing contact information and requesting a return call.  If patient returns call to provider office, please advise to call Embedded Care Management Care Guide Tyianna Menefee at [phone]  Burman Nieves, CCMA Care Guide, Embedded Care Coordination [facility]   Care Management  Direct Dial: [phone]

## 2021-12-03 ENCOUNTER — Encounter: Payer: Self-pay | Admitting: Oncology

## 2021-12-03 ENCOUNTER — Inpatient Hospital Stay: Payer: BC Managed Care – PPO | Attending: Oncology | Admitting: Oncology

## 2021-12-03 ENCOUNTER — Inpatient Hospital Stay: Payer: BC Managed Care – PPO

## 2021-12-03 ENCOUNTER — Other Ambulatory Visit: Payer: Self-pay

## 2021-12-03 ENCOUNTER — Ambulatory Visit: Payer: Self-pay

## 2021-12-03 VITALS — BP 128/67 | HR 68 | Temp 97.6°F | Resp 16 | Ht 67.0 in | Wt 189.0 lb

## 2021-12-03 DIAGNOSIS — R634 Abnormal weight loss: Secondary | ICD-10-CM | POA: Insufficient documentation

## 2021-12-03 DIAGNOSIS — Z7901 Long term (current) use of anticoagulants: Secondary | ICD-10-CM | POA: Insufficient documentation

## 2021-12-03 DIAGNOSIS — D751 Secondary polycythemia: Secondary | ICD-10-CM | POA: Insufficient documentation

## 2021-12-03 DIAGNOSIS — R17 Unspecified jaundice: Secondary | ICD-10-CM | POA: Diagnosis not present

## 2021-12-03 DIAGNOSIS — J449 Chronic obstructive pulmonary disease, unspecified: Secondary | ICD-10-CM | POA: Insufficient documentation

## 2021-12-03 DIAGNOSIS — Z952 Presence of prosthetic heart valve: Secondary | ICD-10-CM | POA: Diagnosis not present

## 2021-12-03 DIAGNOSIS — Z803 Family history of malignant neoplasm of breast: Secondary | ICD-10-CM | POA: Diagnosis not present

## 2021-12-03 DIAGNOSIS — Z800 Family history of malignant neoplasm of digestive organs: Secondary | ICD-10-CM | POA: Insufficient documentation

## 2021-12-03 DIAGNOSIS — Z79899 Other long term (current) drug therapy: Secondary | ICD-10-CM | POA: Diagnosis not present

## 2021-12-03 DIAGNOSIS — F339 Major depressive disorder, recurrent, unspecified: Secondary | ICD-10-CM | POA: Diagnosis not present

## 2021-12-03 LAB — CBC WITH DIFFERENTIAL/PLATELET
Abs Immature Granulocytes: 0.02 10*3/uL (ref 0.00–0.07)
Basophils Absolute: 0.1 10*3/uL (ref 0.0–0.1)
Basophils Relative: 1 %
Eosinophils Absolute: 0.1 10*3/uL (ref 0.0–0.5)
Eosinophils Relative: 2 %
HCT: 46.1 % — ABNORMAL HIGH (ref 36.0–46.0)
Hemoglobin: 15.8 g/dL — ABNORMAL HIGH (ref 12.0–15.0)
Immature Granulocytes: 0 %
Lymphocytes Relative: 20 %
Lymphs Abs: 1.6 10*3/uL (ref 0.7–4.0)
MCH: 31.3 pg (ref 26.0–34.0)
MCHC: 34.3 g/dL (ref 30.0–36.0)
MCV: 91.5 fL (ref 80.0–100.0)
Monocytes Absolute: 0.7 10*3/uL (ref 0.1–1.0)
Monocytes Relative: 8 %
Neutro Abs: 5.5 10*3/uL (ref 1.7–7.7)
Neutrophils Relative %: 69 %
Platelets: 222 10*3/uL (ref 150–400)
RBC: 5.04 MIL/uL (ref 3.87–5.11)
RDW: 12.5 % (ref 11.5–15.5)
WBC: 8 10*3/uL (ref 4.0–10.5)
nRBC: 0 % (ref 0.0–0.2)

## 2021-12-03 LAB — COMPREHENSIVE METABOLIC PANEL
ALT: 21 U/L (ref 0–44)
AST: 25 U/L (ref 15–41)
Albumin: 4.3 g/dL (ref 3.5–5.0)
Alkaline Phosphatase: 88 U/L (ref 38–126)
Anion gap: 7 (ref 5–15)
BUN: 11 mg/dL (ref 6–20)
CO2: 30 mmol/L (ref 22–32)
Calcium: 9.3 mg/dL (ref 8.9–10.3)
Chloride: 100 mmol/L (ref 98–111)
Creatinine, Ser: 0.87 mg/dL (ref 0.44–1.00)
GFR, Estimated: >60 mL/min (ref >60)
Glucose, Bld: 96 mg/dL (ref 70–99)
Potassium: 3.8 mmol/L (ref 3.5–5.1)
Sodium: 137 mmol/L (ref 135–145)
Total Bilirubin: 1.6 mg/dL — ABNORMAL HIGH (ref 0.3–1.2)
Total Protein: 7.6 g/dL (ref 6.5–8.1)

## 2021-12-03 LAB — LACTATE DEHYDROGENASE: LDH: 198 U/L — ABNORMAL HIGH (ref 98–192)

## 2021-12-03 LAB — TECHNOLOGIST SMEAR REVIEW: Plt Morphology: NORMAL

## 2021-12-03 NOTE — Telephone Encounter (Signed)
She does not need to wean off the venlafaxine since she was already taking a very low dose. Just start taking the duloxetine in its place.

## 2021-12-03 NOTE — Telephone Encounter (Signed)
Pt would like to have the nurse call her back to discuss the medications Robynn Pane put her on last week.  She said she is not sure about them and wants to discuss w/ Dr Theodis Aguas nurse   Pt. Concerned with her schedule of "taking Cymbalta and Effexor at the same time and weaning off the Effexor." "I also don't want to take the lorazepam for nausea because it is habit forming." "I want another opinion from Dr. Sherrie Mustache about these medicines." Please advise pt.   Answer Assessment - Initial Assessment Questions 1. NAME of MEDICATION: "What medicine are you calling about?"     Cymbalta and lorazepam 2. QUESTION: "What is your question?" (e.g., double dose of medicine, side effect)     I'm concerned about taking Cymbalta and Effexor at the same time 3. PRESCRIBING HCP: "Who prescribed it?" Reason: if prescribed by specialist, call should be referred to that group.     Merita Norton 4. SYMPTOMS: "Do you have any symptoms?"     No. 5. SEVERITY: If symptoms are present, ask "Are they mild, moderate or severe?"     N/a 6. PREGNANCY:  "Is there any chance that you are pregnant?" "When was your last menstrual period?"     No  Protocols used: Medication Question Call-A-AH

## 2021-12-03 NOTE — Progress Notes (Signed)
New pt referred by Dr Rollene Rotunda for unintentional weight loss. Pt reports weighing 203 in November, today was 189. Pt has family history  of cancer, mother breast cancer x2 and maternal grandmother colon cancer.  Both deceased.  Pt is very emotional today, tearful, states she has trouble with "some new meds and they make her very nauseous".

## 2021-12-03 NOTE — Progress Notes (Signed)
Hematology/Oncology Consult note [phone] [phone]      Patient Care Team: Jacky KindlePayne, Elise T, FNP as PCP - General (Family Medicine)  REFERRING PROVIDER: Jacky KindlePayne, Elise T, FNP  CHIEF COMPLAINTS/REASON FOR VISIT:  Evaluation of unintentional weight loss  HISTORY OF PRESENTING ILLNESS:   Nancy [name] is a  61 y.o.  female with PMH listed below was seen in consultation at the request of  Jacky Kindleayne, Elise T, FNP  for evaluation of unintentional weight loss  Patient weighed 203 pounds in November 2022.  Currently she weighs 189 pounds. She has experienced dizziness episodes likely November and had MRI done 09/27/21 MRI angiogram head without contrast showed no explanation for symptoms.  2 mm outpouching from the supraclinoid left ICA, shape favoring infundibulum over aneurysm She has also felt depressed, has no energy to do anything. She reports a history of depression, she has been off medication for a long period of time.  Recently restarted initially on Zoloft which caused her to have nausea.  Later, medication was switched to Effexor which continues to cause her to have nausea symptoms.  Patient denies any nausea symptoms prior to start of antidepressants.  There was plan to switch patient to Cymbalta. Patient attributes to the weight loss due to the decreased oral intake because of nausea side effects from antidepressants.  Night sweats, fever or chills. Patient has a mechanical heart valve, on chronic anticoagulation.  Review of Systems  Constitutional:  Positive for appetite change, fatigue and unexpected weight change. Negative for chills and fever.  HENT:   Negative for hearing loss and voice change.   Eyes:  Negative for eye problems.  Respiratory:  Negative for chest tightness and cough.   Cardiovascular:  Negative for chest pain.  Gastrointestinal:  Negative for abdominal distention, abdominal pain and blood in stool.  Endocrine: Negative for hot flashes.   Genitourinary:  Negative for difficulty urinating and frequency.   Musculoskeletal:  Negative for arthralgias.  Skin:  Negative for itching and rash.  Neurological:  Negative for extremity weakness.  Hematological:  Negative for adenopathy.  Psychiatric/Behavioral:  Negative for confusion.    MEDICAL HISTORY:  Past Medical History:  Diagnosis Date   Aortic dissection (HCC) 2010   COPD (chronic obstructive pulmonary disease) (HCC)    Depression    Hypertension    Hypothyroidism    Pneumonia    PONV (postoperative nausea and vomiting)    Rosacea    Seasonal allergies    Sleep apnea    Does not use CPAP    SURGICAL HISTORY: Past Surgical History:  Procedure Laterality Date   ABDOMINAL HYSTERECTOMY  2007   AORTIC VALVE REPLACEMENT  06/2009   repeated 2017   APPENDECTOMY  1985   BREAST BIOPSY Right 11/2020   benign   BREAST CYST ASPIRATION     does not remember any details   CARPAL TUNNEL RELEASE Left 04/12/2013   elbow   CATARACT EXTRACTION Right 1994   FOOT SURGERY Left 1997-1998   ORIF ANKLE FRACTURE Right 12/15/2020   Procedure: OPEN REDUCTION INTERNAL FIXATION (ORIF) RIGHT TRIMALLEOLAR ANKLE FRACTURE;  Surgeon: Kathryne HitchBlackman, Christopher Y, MD;  Location: [facility];  Service: Orthopedics;  Laterality: Right;    SOCIAL HISTORY: Social History   Socioeconomic History   Marital status: Single    Spouse name: Not on file   Number of children: 0   Years of education: 13   Highest education level: High school graduate  Occupational History    Employer:  [employer]  Tobacco Use   Smoking status: Former    Packs/day: 0.50    Years: 30.00    Pack years: 15.00    Types: Cigarettes    Quit date: 03/11/2016    Years since quitting: 5.7   Smokeless tobacco: Never   Tobacco comments:    patient states  that she is working at her own pace to quit smoking  Vaping Use   Vaping Use: Former  Substance and Sexual Activity   Alcohol use: Yes    Alcohol/week: 0.0 standard drinks     Comment: OCCASIONALLY   Drug use: No   Sexual activity: Not on file  Other Topics Concern   Not on file  Social History Narrative   Not on file   Social Determinants of Health   Financial Resource Strain: Not on file  Food Insecurity: Not on file  Transportation Needs: Not on file  Physical Activity: Not on file  Stress: Not on file  Social Connections: Not on file  Intimate Partner Violence: Not on file    FAMILY HISTORY: Family History  Problem Relation Age of Onset   Pneumonia Mother    Breast cancer Mother 75       twice 27's and 19   Hypertension Father    Heart disease Father    Healthy Sister    Colon cancer Maternal Grandmother    Heart disease Paternal Grandmother    Prostate cancer Neg Hx    Kidney cancer Neg Hx     ALLERGIES:  is allergic to codeine.  MEDICATIONS:  Current Outpatient Medications  Medication Sig Dispense Refill   albuterol (VENTOLIN HFA) 108 (90 Base) MCG/ACT inhaler SMARTSIG:2 Inhalation Via Inhaler Every 6 Hours PRN     amLODipine (NORVASC) 5 MG tablet Take 5 mg by mouth daily.     budesonide-formoterol (SYMBICORT) 160-4.5 MCG/ACT inhaler Inhale 2 puffs into the lungs daily.      carvedilol (COREG) 25 MG tablet Take 25 mg by mouth 2 (two) times daily.     doxycycline (PERIOSTAT) 20 MG tablet Take 1 tablet (20 mg total) by mouth 2 (two) times daily. 60 tablet 2   Fluticasone-Umeclidin-Vilant 100-62.5-25 MCG/ACT AEPB SMARTSIG:1 Inhalation Via Inhaler Daily     furosemide (LASIX) 40 MG tablet Take 40 mg by mouth daily.     levothyroxine (SYNTHROID) 125 MCG tablet Take 1 tablet (125 mcg total) by mouth daily before breakfast. 90 tablet 4   LORazepam (ATIVAN) 0.5 MG tablet Take 1 tablet (0.5 mg total) by mouth every 8 (eight) hours as needed (nausea, recommend 6am, 2pm, 10pm dosing). 90 tablet 0   ondansetron (ZOFRAN ODT) 4 MG disintegrating tablet Take 1 tablet (4 mg total) by mouth every 8 (eight) hours as needed for nausea or vomiting. 20  tablet 2   warfarin (COUMADIN) 2.5 MG tablet Take 5 mg by mouth daily.     DULoxetine (CYMBALTA) 20 MG capsule Take 2 capsules (40 mg total) by mouth daily. (Patient not taking: Reported on 12/03/2021) 180 capsule 1   oxyCODONE (ROXICODONE) 5 MG immediate release tablet Take 1-2 tablets (5-10 mg total) by mouth every 6 (six) hours as needed. (Patient not taking: Reported on 12/03/2021) 30 tablet 0   topiramate (TOPAMAX) 50 MG tablet Take 50 mg by mouth 2 (two) times daily. (Patient not taking: Reported on 12/03/2021)     Current Facility-Administered Medications  Medication Dose Route Frequency Provider Last Rate Last Admin   ipratropium-albuterol (DUONEB) 0.5-2.5 (3) MG/3ML  nebulizer solution 3 mL  3 mL Nebulization Once Pollak, Adriana M, PA-C         PHYSICAL EXAMINATION: ECOG PERFORMANCE STATUS: 1 - Symptomatic but completely ambulatory Vitals:   12/03/21 1522  BP: 128/67  Pulse: 68  Resp: 16  Temp: 97.6 F (36.4 C)  SpO2: 97%   Filed Weights   12/03/21 1522  Weight: 189 lb (85.7 kg)    Physical Exam Constitutional:      General: She is not in acute distress. HENT:     Head: Normocephalic and atraumatic.  Eyes:     General: No scleral icterus. Cardiovascular:     Rate and Rhythm: Normal rate and regular rhythm.     Comments: clicking sound Pulmonary:     Effort: Pulmonary effort is normal. No respiratory distress.     Breath sounds: No wheezing.  Abdominal:     General: Bowel sounds are normal. There is no distension.     Palpations: Abdomen is soft.  Musculoskeletal:        General: No deformity. Normal range of motion.     Cervical back: Normal range of motion and neck supple.  Skin:    General: Skin is warm and dry.     Findings: No erythema or rash.  Neurological:     Mental Status: She is alert and oriented to person, place, and time. Mental status is at baseline.     Cranial Nerves: No cranial nerve deficit.     Coordination: Coordination normal.   Psychiatric:     Comments: Tearful    LABORATORY DATA:  I have reviewed the data as listed Lab Results  Component Value Date   WBC 8.0 12/03/2021   HGB 15.8 (H) 12/03/2021   HCT 46.1 (H) 12/03/2021   MCV 91.5 12/03/2021   PLT 222 12/03/2021   Recent Labs    12/08/20 2353 12/15/20 0845 09/21/21 1411 12/03/21 1601  NA 130* 137 141 137  K 3.2* 3.2* 3.5 3.8  CL 94* 100 100 100  CO2 24 26 26 30   GLUCOSE 104* 102* 84 96  BUN 10 14 13 11   CREATININE 0.65 0.74 0.85 0.87  CALCIUM 8.4* 8.9 9.5 9.3  GFRNONAA >60 >60  --  >60  PROT  --  7.3 7.2 7.6  ALBUMIN  --  3.8 4.6 4.3  AST  --  18 23 25   ALT  --  15 13 21   ALKPHOS  --  81 138* 88  BILITOT  --  1.2 0.7 1.6*   Iron/TIBC/Ferritin/ %Sat No results found for: IRON, TIBC, FERRITIN, IRONPCTSAT    RADIOGRAPHIC STUDIES: I have personally reviewed the radiological images as listed and agreed with the findings in the report. No results found.    ASSESSMENT & PLAN:  1. Weight loss   2. Erythrocytosis   3. Depression, recurrent (HCC)   4. Total bilirubin, elevated    #Weight loss, Physical examination did not reveal any lymphadenopathy, organomegaly.  She does not have other constitutional symptoms.  Patient feels that the weight loss is today due to side effects from antidepressants.  Timeframe of onset of symptoms fit with start of antidepressants. Check CBC, CMP,Flow cytometry, smear, LDH.  We discussed about option of obtaining CT scan of the abdomen pelvis, patient prefers to defer any statin for now.  She will try to increase oral intake and see how she does.  I think this is reasonable.  #Increased total bilirubin.  I will check direct bilirubin.  #  Erythrocytosis, this appears to be new for her.  Patient has OSA and COPD.  I recommend sleep study. #Depression, continue follow-up with primary care provider for management.#Likely secondary to depression OSA. #  Orders Placed This Encounter  Procedures    Comprehensive metabolic panel    Standing Status:   Future    Number of Occurrences:   1    Standing Expiration Date:   12/03/2022   CBC with Differential/Platelet    Standing Status:   Future    Number of Occurrences:   1    Standing Expiration Date:   12/03/2022   Flow cytometry panel-leukemia/lymphoma work-up    Standing Status:   Future    Number of Occurrences:   1    Standing Expiration Date:   12/03/2022   Lactate dehydrogenase    Standing Status:   Future    Number of Occurrences:   1    Standing Expiration Date:   12/03/2022   Technologist smear review    Standing Status:   Future    Number of Occurrences:   1    Standing Expiration Date:   12/03/2022    All questions were answered. The patient knows to call the clinic with any problems questions or concerns.  cc Jacky KindlePayne, Elise T, FNP    Return of visit: 3 months Thank you for this kind study and the opportunity to participate in the care of this patient. A copy of today's note is routed to referring provider   Rickard PatienceZhou Jahira Swiss, MD, PhD [facility] 12/03/2021

## 2021-12-04 ENCOUNTER — Other Ambulatory Visit: Payer: Self-pay

## 2021-12-04 DIAGNOSIS — R17 Unspecified jaundice: Secondary | ICD-10-CM

## 2021-12-04 LAB — BILIRUBIN, DIRECT: Bilirubin, Direct: 0.3 mg/dL — ABNORMAL HIGH (ref 0.0–0.2)

## 2021-12-05 LAB — COMP PANEL: LEUKEMIA/LYMPHOMA

## 2021-12-06 ENCOUNTER — Ambulatory Visit: Payer: Self-pay | Admitting: Dermatology

## 2021-12-06 DIAGNOSIS — H18601 Keratoconus, unspecified, right eye: Secondary | ICD-10-CM | POA: Diagnosis not present

## 2021-12-11 NOTE — Chronic Care Management (AMB) (Signed)
°  Care Management   Outreach Note  12/11/2021 Name: Nancy [name] MRN: [id number] DOB: 1961/07/11  Referred by: Jacky Kindle, FNP Reason for referral : Care Coordination (Initial outreach to schedule referral with Licensed Clinical SW)   Successful outreach made and pt wants to give new medication a few more weeks before scheduling with Licensed Clinical SW - contact info given and pt will call back   Follow Up Plan:  If no return call will contact pt toward end of February   Burman Nieves, CCMA Care Guide, Embedded Care Coordination [facility]   Care Management  Direct Dial: [phone]

## 2021-12-17 DIAGNOSIS — I671 Cerebral aneurysm, nonruptured: Secondary | ICD-10-CM | POA: Diagnosis not present

## 2021-12-17 DIAGNOSIS — I10 Essential (primary) hypertension: Secondary | ICD-10-CM | POA: Diagnosis not present

## 2021-12-17 DIAGNOSIS — Z6829 Body mass index (BMI) 29.0-29.9, adult: Secondary | ICD-10-CM | POA: Diagnosis not present

## 2021-12-18 DIAGNOSIS — G4733 Obstructive sleep apnea (adult) (pediatric): Secondary | ICD-10-CM | POA: Diagnosis not present

## 2021-12-18 DIAGNOSIS — R911 Solitary pulmonary nodule: Secondary | ICD-10-CM | POA: Diagnosis not present

## 2021-12-18 DIAGNOSIS — R0602 Shortness of breath: Secondary | ICD-10-CM | POA: Diagnosis not present

## 2021-12-18 DIAGNOSIS — J449 Chronic obstructive pulmonary disease, unspecified: Secondary | ICD-10-CM | POA: Diagnosis not present

## 2021-12-18 DIAGNOSIS — Z5181 Encounter for therapeutic drug level monitoring: Secondary | ICD-10-CM | POA: Diagnosis not present

## 2022-01-01 NOTE — Chronic Care Management (AMB) (Signed)
°  Care Management   Outreach Note  01/01/2022 Name: Nancy [name] MRN: [id number] DOB: [birth year]  Referred by: Jacky Kindle, FNP Reason for referral : Care Coordination (Initial outreach to schedule referral with Licensed Clinical SW)   Successful outreach made, pt appreciative but declines to schedule with LCSW at this time    Nancy [name], CCMA Care Guide, Embedded Care Coordination [facility]   Care Management  Direct Dial: [phone]

## 2022-01-02 ENCOUNTER — Other Ambulatory Visit: Payer: Self-pay | Admitting: Dermatology

## 2022-01-09 DIAGNOSIS — H10401 Unspecified chronic conjunctivitis, right eye: Secondary | ICD-10-CM | POA: Diagnosis not present

## 2022-02-08 ENCOUNTER — Other Ambulatory Visit: Payer: Self-pay | Admitting: Dermatology

## 2022-02-14 ENCOUNTER — Ambulatory Visit: Payer: Self-pay | Admitting: Dermatology

## 2022-03-01 ENCOUNTER — Telehealth: Payer: Self-pay | Admitting: Oncology

## 2022-03-01 NOTE — Telephone Encounter (Signed)
pt cannot make appt, will call to r/s .Doristine Church  ?

## 2022-03-04 ENCOUNTER — Inpatient Hospital Stay: Payer: BC Managed Care – PPO | Admitting: Oncology

## 2022-03-04 DIAGNOSIS — H18601 Keratoconus, unspecified, right eye: Secondary | ICD-10-CM | POA: Diagnosis not present

## 2022-03-14 ENCOUNTER — Ambulatory Visit: Payer: Self-pay | Admitting: Dermatology

## 2022-03-19 DIAGNOSIS — Z5181 Encounter for therapeutic drug level monitoring: Secondary | ICD-10-CM | POA: Diagnosis not present

## 2022-03-25 DIAGNOSIS — Z8679 Personal history of other diseases of the circulatory system: Secondary | ICD-10-CM | POA: Diagnosis not present

## 2022-03-25 DIAGNOSIS — Z952 Presence of prosthetic heart valve: Secondary | ICD-10-CM | POA: Diagnosis not present

## 2022-03-25 DIAGNOSIS — I10 Essential (primary) hypertension: Secondary | ICD-10-CM | POA: Diagnosis not present

## 2022-03-25 DIAGNOSIS — I719 Aortic aneurysm of unspecified site, without rupture: Secondary | ICD-10-CM | POA: Diagnosis not present

## 2022-05-30 ENCOUNTER — Other Ambulatory Visit: Payer: Self-pay | Admitting: Family Medicine

## 2022-05-30 DIAGNOSIS — F339 Major depressive disorder, recurrent, unspecified: Secondary | ICD-10-CM

## 2022-07-12 ENCOUNTER — Other Ambulatory Visit: Payer: Self-pay | Admitting: Dermatology

## 2022-07-29 DIAGNOSIS — Z9889 Other specified postprocedural states: Secondary | ICD-10-CM | POA: Diagnosis not present

## 2022-07-29 DIAGNOSIS — I745 Embolism and thrombosis of iliac artery: Secondary | ICD-10-CM | POA: Diagnosis not present

## 2022-07-29 DIAGNOSIS — Q231 Congenital insufficiency of aortic valve: Secondary | ICD-10-CM | POA: Diagnosis not present

## 2022-07-29 DIAGNOSIS — Z952 Presence of prosthetic heart valve: Secondary | ICD-10-CM | POA: Diagnosis not present

## 2022-07-29 DIAGNOSIS — I7102 Dissection of abdominal aorta: Secondary | ICD-10-CM | POA: Diagnosis not present

## 2022-07-29 DIAGNOSIS — Z8679 Personal history of other diseases of the circulatory system: Secondary | ICD-10-CM | POA: Diagnosis not present

## 2022-07-29 DIAGNOSIS — I7121 Aneurysm of the ascending aorta, without rupture: Secondary | ICD-10-CM | POA: Diagnosis not present

## 2022-07-29 DIAGNOSIS — J449 Chronic obstructive pulmonary disease, unspecified: Secondary | ICD-10-CM | POA: Diagnosis not present

## 2022-07-29 DIAGNOSIS — Z48812 Encounter for surgical aftercare following surgery on the circulatory system: Secondary | ICD-10-CM | POA: Diagnosis not present

## 2022-07-29 DIAGNOSIS — I719 Aortic aneurysm of unspecified site, without rupture: Secondary | ICD-10-CM | POA: Diagnosis not present

## 2022-07-29 DIAGNOSIS — I7772 Dissection of iliac artery: Secondary | ICD-10-CM | POA: Diagnosis not present

## 2022-07-29 DIAGNOSIS — I71012 Dissection of descending thoracic aorta: Secondary | ICD-10-CM | POA: Diagnosis not present

## 2022-08-13 ENCOUNTER — Other Ambulatory Visit: Payer: Self-pay | Admitting: Dermatology

## 2022-08-28 ENCOUNTER — Ambulatory Visit: Payer: BC Managed Care – PPO | Admitting: Dermatology

## 2022-08-28 DIAGNOSIS — L719 Rosacea, unspecified: Secondary | ICD-10-CM | POA: Diagnosis not present

## 2022-08-28 DIAGNOSIS — L988 Other specified disorders of the skin and subcutaneous tissue: Secondary | ICD-10-CM | POA: Diagnosis not present

## 2022-08-28 DIAGNOSIS — Z79899 Other long term (current) drug therapy: Secondary | ICD-10-CM | POA: Diagnosis not present

## 2022-08-28 MED ORDER — DOXYCYCLINE HYCLATE 20 MG PO TABS: 20.0000 mg | ORAL_TABLET | Freq: Two times a day (BID) | ORAL | 6 refills | Status: DC

## 2022-08-28 NOTE — Patient Instructions (Addendum)
Doxycycline should be taken with food to prevent nausea. Do not lay down for 30 minutes after taking. Be cautious with sun exposure and use good sun protection while on this medication. Pregnant women should not take this medication.   Due to recent changes in healthcare laws, you may see results of your pathology and/or laboratory studies on MyChart before the doctors have had a chance to review them. We understand that in some cases there may be results that are confusing or concerning to you. Please understand that not all results are received at the same time and often the doctors may need to interpret multiple results in order to provide you with the best plan of care or course of treatment. Therefore, we ask that you please give us 2 business days to thoroughly review all your results before contacting the office for clarification. Should we see a critical lab result, you will be contacted sooner.   If You Need Anything After Your Visit  If you have any questions or concerns for your doctor, please call our main line at [phone] and press option 4 to reach your doctor's medical assistant. If no one answers, please leave a voicemail as directed and we will return your call as soon as possible. Messages left after 4 pm will be answered the following business day.   You may also send us a message via MyChart. We typically respond to MyChart messages within 1-2 business days.  For prescription refills, please ask your pharmacy to contact our office. Our fax number is [phone].  If you have an urgent issue when the clinic is closed that cannot wait until the next business day, you can page your doctor at the number below.    Please note that while we do our best to be available for urgent issues outside of office hours, we are not available 24/7.   If you have an urgent issue and are unable to reach us, you may choose to seek medical care at your doctor's office, retail clinic, urgent care  center, or emergency room.  If you have a medical emergency, please immediately call 911 or go to the emergency department.  Pager Numbers  - Dr. Kowalski: [phone]  - Dr. Moye: [phone]  - Dr. Stewart: [phone]  In the event of inclement weather, please call our main line at [phone] for an update on the status of any delays or closures.  Dermatology Medication Tips: Please keep the boxes that topical medications come in in order to help keep track of the instructions about where and how to use these. Pharmacies typically print the medication instructions only on the boxes and not directly on the medication tubes.   If your medication is too expensive, please contact our office at [phone] option 4 or send us a message through MyChart.   We are unable to tell what your co-pay for medications will be in advance as this is different depending on your insurance coverage. However, we may be able to find a substitute medication at lower cost or fill out paperwork to get insurance to cover a needed medication.   If a prior authorization is required to get your medication covered by your insurance company, please allow us 1-2 business days to complete this process.  Drug prices often vary depending on where the prescription is filled and some pharmacies may offer cheaper prices.  The website www.goodrx.com contains coupons for medications through different pharmacies. The prices here do not account for what the   cost may be with help from insurance (it may be cheaper with your insurance), but the website can give you the price if you did not use any insurance.  - You can print the associated coupon and take it with your prescription to the pharmacy.  - You may also stop by our office during regular business hours and pick up a GoodRx coupon card.  - If you need your prescription sent electronically to a different pharmacy, notify our office through [facility] MyChart or by  phone at [phone] option 4.     Si Usted Necesita Algo Despus de Su Visita  Tambin puede enviarnos un mensaje a travs de MyChart. Por lo general respondemos a los mensajes de MyChart en el transcurso de 1 a 2 das hbiles.  Para renovar recetas, por favor pida a su farmacia que se ponga en contacto con nuestra oficina. Nuestro nmero de fax es el [phone].  Si tiene un asunto urgente cuando la clnica est cerrada y que no puede esperar hasta el siguiente da hbil, puede llamar/localizar a su doctor(a) al nmero que aparece a continuacin.   Por favor, tenga en cuenta que aunque hacemos todo lo posible para estar disponibles para asuntos urgentes fuera del horario de oficina, no estamos disponibles las 24 horas del da, los 7 das de la semana.   Si tiene un problema urgente y no puede comunicarse con nosotros, puede optar por buscar atencin mdica  en el consultorio de su doctor(a), en una clnica privada, en un centro de atencin urgente o en una sala de emergencias.  Si tiene una emergencia mdica, por favor llame inmediatamente al 911 o vaya a la sala de emergencias.  Nmeros de bper  - Dr. Kowalski: [phone]  - Dra. Moye: [phone]  - Dra. Stewart: [phone]  En caso de inclemencias del tiempo, por favor llame a nuestra lnea principal al [phone] para una actualizacin sobre el estado de cualquier retraso o cierre.  Consejos para la medicacin en dermatologa: Por favor, guarde las cajas en las que vienen los medicamentos de uso tpico para ayudarle a seguir las instrucciones sobre dnde y cmo usarlos. Las farmacias generalmente imprimen las instrucciones del medicamento slo en las cajas y no directamente en los tubos del medicamento.   Si su medicamento es muy caro, por favor, pngase en contacto con nuestra oficina llamando al [phone] y presione la opcin 4 o envenos un mensaje a travs de MyChart.   No podemos decirle cul ser su copago  por los medicamentos por adelantado ya que esto es diferente dependiendo de la cobertura de su seguro. Sin embargo, es posible que podamos encontrar un medicamento sustituto a menor costo o llenar un formulario para que el seguro cubra el medicamento que se considera necesario.   Si se requiere una autorizacin previa para que su compaa de seguros cubra su medicamento, por favor permtanos de 1 a 2 das hbiles para completar este proceso.  Los precios de los medicamentos varan con frecuencia dependiendo del lugar de dnde se surte la receta y alguna farmacias pueden ofrecer precios ms baratos.  El sitio web www.goodrx.com tiene cupones para medicamentos de diferentes farmacias. Los precios aqu no tienen en cuenta lo que podra costar con la ayuda del seguro (puede ser ms barato con su seguro), pero el sitio web puede darle el precio si no utiliz ningn seguro.  - Puede imprimir el cupn correspondiente y llevarlo con su receta a la farmacia.  - Tambin puede pasar por   nuestra oficina durante el horario de atencin regular y recoger una tarjeta de cupones de GoodRx.  - Si necesita que su receta se enve electrnicamente a una farmacia diferente, informe a nuestra oficina a travs de MyChart de [facility] o por telfono llamando al [phone] y presione la opcin 4.  

## 2022-08-28 NOTE — Progress Notes (Signed)
   Follow-Up Visit   Subjective  Nancy [name] is a 61 y.o. female who presents for the following: Rosacea (Yearly f/u on Rosacea on her face, treating with Doxycycline 20 mg twice a day with a good response. ).  The following portions of the chart were reviewed this encounter and updated as appropriate:   Tobacco  Allergies  Meds  Problems  Med Hx  Surg Hx  Fam Hx     Review of Systems:  No other skin or systemic complaints except as noted in HPI or Assessment and Plan.  Objective  Well appearing patient in no apparent distress; mood and affect are within normal limits.  A focused examination was performed including face. Relevant physical exam findings are noted in the Assessment and Plan.  face Mid face erythema with telangiectasias +/- scattered inflammatory papules.   face Rhytides and volume loss.    Assessment & Plan  Rosacea Face Chronic and persistent condition with duration or expected duration over one year. Condition is symptomatic / bothersome to patient. Not to goal.  Rosacea is a chronic progressive skin condition usually affecting the face of adults, causing redness and/or acne bumps. It is treatable but not curable. It sometimes affects the eyes (ocular rosacea) as well. It may respond to topical and/or systemic medication and can flare with stress, sun exposure, alcohol, exercise, topical steroids (including hydrocortisone/cortisone 10) and some foods.  Daily application of broad spectrum spf 30+ sunscreen to face is recommended to reduce flares.   Discussed Isotretinoin a option, pt decline  Continue Doxycyline 20 mg twice a day   Doxycycline should be taken with food to prevent nausea. Do not lay down for 30 minutes after taking. Be cautious with sun exposure and use good sun protection while on this medication. Pregnant women should not take this medication.     Related Medications doxycycline (PERIOSTAT) 20 MG tablet Take 1 tablet (20 mg total) by  mouth 2 (two) times daily.  Elastosis of skin face Discussed Restylane Refyne or Restylane Defyne for corners of mouth/Marionette lines/oral commissures  Return in about 1 year (around 08/29/2023) for Rosacea.  IMarye Round, CMA, am acting as scribe for Sarina Ser, MD .  Documentation: I have reviewed the above documentation for accuracy and completeness, and I agree with the above.  Sarina Ser, MD

## 2022-09-07 ENCOUNTER — Encounter: Payer: Self-pay | Admitting: Dermatology

## 2022-09-09 ENCOUNTER — Ambulatory Visit
Admission: RE | Admit: 2022-09-09 | Discharge: 2022-09-09 | Disposition: A | Discharge status: Home / Self Care | Payer: BC Managed Care – PPO | Source: Ambulatory Visit | Attending: Family Medicine | Admitting: Family Medicine

## 2022-09-09 DIAGNOSIS — Z1231 Encounter for screening mammogram for malignant neoplasm of breast: Secondary | ICD-10-CM | POA: Insufficient documentation

## 2022-09-13 DIAGNOSIS — T797XXA Traumatic subcutaneous emphysema, initial encounter: Secondary | ICD-10-CM | POA: Diagnosis not present

## 2022-09-13 DIAGNOSIS — Z7901 Long term (current) use of anticoagulants: Secondary | ICD-10-CM | POA: Diagnosis not present

## 2022-09-13 DIAGNOSIS — Z9889 Other specified postprocedural states: Secondary | ICD-10-CM | POA: Diagnosis not present

## 2022-09-13 DIAGNOSIS — I7103 Dissection of thoracoabdominal aorta: Secondary | ICD-10-CM | POA: Diagnosis not present

## 2022-09-13 DIAGNOSIS — Z79899 Other long term (current) drug therapy: Secondary | ICD-10-CM | POA: Diagnosis not present

## 2022-09-13 DIAGNOSIS — J9811 Atelectasis: Secondary | ICD-10-CM | POA: Diagnosis not present

## 2022-09-13 DIAGNOSIS — J982 Interstitial emphysema: Secondary | ICD-10-CM | POA: Diagnosis not present

## 2022-09-13 DIAGNOSIS — I712 Thoracic aortic aneurysm, without rupture, unspecified: Secondary | ICD-10-CM | POA: Diagnosis not present

## 2022-09-13 DIAGNOSIS — Z885 Allergy status to narcotic agent status: Secondary | ICD-10-CM | POA: Diagnosis not present

## 2022-09-13 DIAGNOSIS — Z01810 Encounter for preprocedural cardiovascular examination: Secondary | ICD-10-CM | POA: Diagnosis not present

## 2022-09-13 DIAGNOSIS — J3800 Paralysis of vocal cords and larynx, unspecified: Secondary | ICD-10-CM | POA: Diagnosis not present

## 2022-09-13 DIAGNOSIS — Z952 Presence of prosthetic heart valve: Secondary | ICD-10-CM | POA: Diagnosis not present

## 2022-09-13 DIAGNOSIS — Z4682 Encounter for fitting and adjustment of non-vascular catheter: Secondary | ICD-10-CM | POA: Diagnosis not present

## 2022-09-13 DIAGNOSIS — I10 Essential (primary) hypertension: Secondary | ICD-10-CM | POA: Diagnosis not present

## 2022-09-13 DIAGNOSIS — J441 Chronic obstructive pulmonary disease with (acute) exacerbation: Secondary | ICD-10-CM | POA: Diagnosis not present

## 2022-09-13 DIAGNOSIS — J439 Emphysema, unspecified: Secondary | ICD-10-CM | POA: Diagnosis not present

## 2022-09-13 DIAGNOSIS — G4733 Obstructive sleep apnea (adult) (pediatric): Secondary | ICD-10-CM | POA: Diagnosis not present

## 2022-09-13 DIAGNOSIS — I7090 Unspecified atherosclerosis: Secondary | ICD-10-CM | POA: Diagnosis not present

## 2022-09-13 DIAGNOSIS — R768 Other specified abnormal immunological findings in serum: Secondary | ICD-10-CM | POA: Diagnosis not present

## 2022-09-13 DIAGNOSIS — I719 Aortic aneurysm of unspecified site, without rupture: Secondary | ICD-10-CM | POA: Diagnosis not present

## 2022-09-13 DIAGNOSIS — I7121 Aneurysm of the ascending aorta, without rupture: Secondary | ICD-10-CM | POA: Diagnosis not present

## 2022-09-13 DIAGNOSIS — J90 Pleural effusion, not elsewhere classified: Secondary | ICD-10-CM | POA: Diagnosis not present

## 2022-09-13 DIAGNOSIS — F32A Depression, unspecified: Secondary | ICD-10-CM | POA: Diagnosis not present

## 2022-09-13 DIAGNOSIS — F17290 Nicotine dependence, other tobacco product, uncomplicated: Secondary | ICD-10-CM | POA: Diagnosis not present

## 2022-09-13 DIAGNOSIS — R918 Other nonspecific abnormal finding of lung field: Secondary | ICD-10-CM | POA: Diagnosis not present

## 2022-09-13 DIAGNOSIS — J449 Chronic obstructive pulmonary disease, unspecified: Secondary | ICD-10-CM | POA: Diagnosis not present

## 2022-09-13 DIAGNOSIS — T8092XA Unspecified transfusion reaction, initial encounter: Secondary | ICD-10-CM | POA: Diagnosis not present

## 2022-09-13 DIAGNOSIS — Z95828 Presence of other vascular implants and grafts: Secondary | ICD-10-CM | POA: Diagnosis not present

## 2022-09-13 DIAGNOSIS — E039 Hypothyroidism, unspecified: Secondary | ICD-10-CM | POA: Diagnosis not present

## 2022-09-13 DIAGNOSIS — I7122 Aneurysm of the aortic arch, without rupture: Secondary | ICD-10-CM | POA: Diagnosis not present

## 2022-09-13 DIAGNOSIS — G43909 Migraine, unspecified, not intractable, without status migrainosus: Secondary | ICD-10-CM | POA: Diagnosis not present

## 2022-09-13 DIAGNOSIS — R0602 Shortness of breath: Secondary | ICD-10-CM | POA: Diagnosis not present

## 2022-09-13 DIAGNOSIS — J3801 Paralysis of vocal cords and larynx, unilateral: Secondary | ICD-10-CM | POA: Diagnosis not present

## 2022-09-13 DIAGNOSIS — G8918 Other acute postprocedural pain: Secondary | ICD-10-CM | POA: Diagnosis not present

## 2022-09-13 DIAGNOSIS — R5381 Other malaise: Secondary | ICD-10-CM | POA: Diagnosis not present

## 2022-09-13 DIAGNOSIS — I7123 Aneurysm of the descending thoracic aorta, without rupture: Secondary | ICD-10-CM | POA: Diagnosis not present

## 2022-09-17 DIAGNOSIS — I7121 Aneurysm of the ascending aorta, without rupture: Secondary | ICD-10-CM | POA: Insufficient documentation

## 2022-09-26 DIAGNOSIS — R768 Other specified abnormal immunological findings in serum: Secondary | ICD-10-CM | POA: Diagnosis not present

## 2022-09-30 ENCOUNTER — Ambulatory Visit: Payer: Self-pay | Admitting: *Deleted

## 2022-09-30 ENCOUNTER — Other Ambulatory Visit: Payer: Self-pay | Admitting: *Deleted

## 2022-09-30 DIAGNOSIS — Z48812 Encounter for surgical aftercare following surgery on the circulatory system: Secondary | ICD-10-CM | POA: Diagnosis not present

## 2022-09-30 DIAGNOSIS — F32A Depression, unspecified: Secondary | ICD-10-CM | POA: Diagnosis not present

## 2022-09-30 DIAGNOSIS — J449 Chronic obstructive pulmonary disease, unspecified: Secondary | ICD-10-CM | POA: Diagnosis not present

## 2022-09-30 DIAGNOSIS — G8918 Other acute postprocedural pain: Secondary | ICD-10-CM | POA: Diagnosis not present

## 2022-09-30 DIAGNOSIS — R110 Nausea: Secondary | ICD-10-CM

## 2022-09-30 DIAGNOSIS — E039 Hypothyroidism, unspecified: Secondary | ICD-10-CM | POA: Diagnosis not present

## 2022-09-30 DIAGNOSIS — Z7982 Long term (current) use of aspirin: Secondary | ICD-10-CM | POA: Diagnosis not present

## 2022-09-30 DIAGNOSIS — J3801 Paralysis of vocal cords and larynx, unilateral: Secondary | ICD-10-CM | POA: Diagnosis not present

## 2022-09-30 DIAGNOSIS — R1314 Dysphagia, pharyngoesophageal phase: Secondary | ICD-10-CM | POA: Diagnosis not present

## 2022-09-30 DIAGNOSIS — F17200 Nicotine dependence, unspecified, uncomplicated: Secondary | ICD-10-CM | POA: Diagnosis not present

## 2022-09-30 DIAGNOSIS — G4733 Obstructive sleep apnea (adult) (pediatric): Secondary | ICD-10-CM | POA: Diagnosis not present

## 2022-09-30 DIAGNOSIS — R490 Dysphonia: Secondary | ICD-10-CM | POA: Diagnosis not present

## 2022-09-30 DIAGNOSIS — I7121 Aneurysm of the ascending aorta, without rupture: Secondary | ICD-10-CM | POA: Diagnosis not present

## 2022-09-30 DIAGNOSIS — Z9981 Dependence on supplemental oxygen: Secondary | ICD-10-CM | POA: Diagnosis not present

## 2022-09-30 DIAGNOSIS — I10 Essential (primary) hypertension: Secondary | ICD-10-CM | POA: Diagnosis not present

## 2022-09-30 MED ORDER — ONDANSETRON 4 MG PO TBDP: 4.0000 mg | ORAL_TABLET | Freq: Three times a day (TID) | ORAL | 2 refills | Status: DC | PRN

## 2022-09-30 NOTE — Telephone Encounter (Signed)
  Chief Complaint: nausea after every Duoneb treatment since having heart surgery last week 09/17/22. Requesting refill zofran and how often zofran can be given before duoneb treatments? 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  Symptoms: nausea after taking Duoneb treatments and and does not want to take due to nausea then has labored breathing. Cardiologist recommended to see PCP for refill zofran per patient sister Amy.  Frequency: since Friday last week after heart surgery Pertinent Negatives: Patient denies chest pain no difficulty breathing no vomiting  Disposition: [] ED /[] Urgent Care (no appt availability in office) / [] Appointment(In office/virtual)/ []  [facility]/ [] Home Care/ [] Refused Recommended Disposition /[] [facility]/ [x]  Follow-up with PCP Additional Notes:   Recommended appt none available until 10/07/22. Patient in need of medication now and will scheduld appt later due to multiple other appt to go to this week due to heart surgery issues. Please advise and order zofran if possible or call patient's sister Amy #[phone] for assistance.    Reason for Disposition  Taking prescription medication that could cause nausea (e.g., narcotics/opiates, antibiotics, OCPs, many others)  Answer Assessment - Initial Assessment Questions 1. NAUSEA SEVERITY: "How bad is the nausea?" (e.g., mild, moderate, severe; dehydration, weight loss)   - MILD: loss of appetite without change in eating habits   - MODERATE: decreased oral intake without significant weight loss, dehydration, or malnutrition   - SEVERE: inadequate caloric or fluid intake, significant weight loss, symptoms of dehydration     Nausea after taking duoneb treatments. S/p heart surgery  2. ONSET: "When did the nausea begin?"     After heart surgery at [facility] last week  3. VOMITING: "Any vomiting?" If Yes, ask: "How many times today?"     no 4.  RECURRENT SYMPTOM: "Have you had nausea before?" If Yes, ask: "When was the last time?" "What happened that time?"     Yes  5. CAUSE: "What do you think is causing the nausea?"     S/p heart surgery . Using duoneb treatments  6. PREGNANCY: "Is there any chance you are pregnant?" (e.g., unprotected intercourse, missed birth control pill, broken condom)     na  Protocols used: Nausea-A-AH

## 2022-09-30 NOTE — Telephone Encounter (Signed)
Requested medication (s) are due for refill today: yes  Requested medication (s) are on the active medication list: yes  Last refill:  11/27/21 #20 2 refills  Future visit scheduled: no  Notes to clinic:  not delegated per protocol. Patient 's sister Amy requesting refill and reports she will schedule appt when she can review patient's other dr appt due to recent heart surgery . Will call back to scheduled . Please advise.      Requested Prescriptions  Pending Prescriptions Disp Refills   ondansetron (ZOFRAN ODT) 4 MG disintegrating tablet 20 tablet 2    Sig: Take 1 tablet (4 mg total) by mouth every 8 (eight) hours as needed for nausea or vomiting.     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 Malva Limes, MD   2 years ago Annual physical exam   [facility] Chrismon, Jodell Cipro, PA-C   2 years ago Essential (primary) hypertension   [facility] [place], Marzella Schlein, MD   3 years ago Nausea   [facility] Chrismon, Maryjean Morn              Passed - AST in normal range and within 360 days    AST  Date Value Ref Range Status  12/03/2021 25 15 - 41 U/L Final   SGOT(AST)  Date Value Ref Range Status  03/11/2014 22 15 - 37 Unit/L Final         Passed - ALT in normal range and within 360 days    ALT  Date Value Ref Range Status  12/03/2021 21 0 - 44 U/L Final   SGPT (ALT)  Date Value Ref Range Status  03/11/2014 25 12 - 78 U/L Final

## 2022-10-01 ENCOUNTER — Other Ambulatory Visit: Payer: Self-pay | Admitting: Family Medicine

## 2022-10-01 DIAGNOSIS — Z7901 Long term (current) use of anticoagulants: Secondary | ICD-10-CM | POA: Diagnosis not present

## 2022-10-01 DIAGNOSIS — R110 Nausea: Secondary | ICD-10-CM

## 2022-10-01 MED ORDER — ONDANSETRON 4 MG PO TBDP: 4.0000 mg | ORAL_TABLET | Freq: Three times a day (TID) | ORAL | 2 refills | Status: AC | PRN

## 2022-10-01 NOTE — Telephone Encounter (Signed)
Spoke with patient and patient's sister.

## 2022-10-07 DIAGNOSIS — J90 Pleural effusion, not elsewhere classified: Secondary | ICD-10-CM | POA: Diagnosis not present

## 2022-10-07 DIAGNOSIS — Z7982 Long term (current) use of aspirin: Secondary | ICD-10-CM | POA: Diagnosis not present

## 2022-10-07 DIAGNOSIS — I7103 Dissection of thoracoabdominal aorta: Secondary | ICD-10-CM | POA: Diagnosis not present

## 2022-10-07 DIAGNOSIS — J9811 Atelectasis: Secondary | ICD-10-CM | POA: Diagnosis not present

## 2022-10-07 DIAGNOSIS — Z9229 Personal history of other drug therapy: Secondary | ICD-10-CM | POA: Diagnosis not present

## 2022-10-07 DIAGNOSIS — Z7901 Long term (current) use of anticoagulants: Secondary | ICD-10-CM | POA: Diagnosis not present

## 2022-10-07 DIAGNOSIS — I7123 Aneurysm of the descending thoracic aorta, without rupture: Secondary | ICD-10-CM | POA: Diagnosis not present

## 2022-10-07 DIAGNOSIS — Z952 Presence of prosthetic heart valve: Secondary | ICD-10-CM | POA: Diagnosis not present

## 2022-10-07 DIAGNOSIS — R9431 Abnormal electrocardiogram [ECG] [EKG]: Secondary | ICD-10-CM | POA: Diagnosis not present

## 2022-10-07 DIAGNOSIS — Z4889 Encounter for other specified surgical aftercare: Secondary | ICD-10-CM | POA: Diagnosis not present

## 2022-10-07 DIAGNOSIS — I493 Ventricular premature depolarization: Secondary | ICD-10-CM | POA: Diagnosis not present

## 2022-10-08 DIAGNOSIS — Z2821 Immunization not carried out because of patient refusal: Secondary | ICD-10-CM | POA: Diagnosis not present

## 2022-10-08 DIAGNOSIS — F17200 Nicotine dependence, unspecified, uncomplicated: Secondary | ICD-10-CM | POA: Diagnosis not present

## 2022-10-08 DIAGNOSIS — Z7982 Long term (current) use of aspirin: Secondary | ICD-10-CM | POA: Diagnosis not present

## 2022-10-08 DIAGNOSIS — G8918 Other acute postprocedural pain: Secondary | ICD-10-CM | POA: Diagnosis not present

## 2022-10-08 DIAGNOSIS — R1314 Dysphagia, pharyngoesophageal phase: Secondary | ICD-10-CM | POA: Diagnosis not present

## 2022-10-08 DIAGNOSIS — R490 Dysphonia: Secondary | ICD-10-CM | POA: Diagnosis not present

## 2022-10-08 DIAGNOSIS — J449 Chronic obstructive pulmonary disease, unspecified: Secondary | ICD-10-CM | POA: Diagnosis not present

## 2022-10-08 DIAGNOSIS — J3801 Paralysis of vocal cords and larynx, unilateral: Secondary | ICD-10-CM | POA: Diagnosis not present

## 2022-10-08 DIAGNOSIS — E039 Hypothyroidism, unspecified: Secondary | ICD-10-CM | POA: Diagnosis not present

## 2022-10-08 DIAGNOSIS — F32A Depression, unspecified: Secondary | ICD-10-CM | POA: Diagnosis not present

## 2022-10-08 DIAGNOSIS — G4733 Obstructive sleep apnea (adult) (pediatric): Secondary | ICD-10-CM | POA: Diagnosis not present

## 2022-10-08 DIAGNOSIS — I10 Essential (primary) hypertension: Secondary | ICD-10-CM | POA: Diagnosis not present

## 2022-10-08 DIAGNOSIS — Z9981 Dependence on supplemental oxygen: Secondary | ICD-10-CM | POA: Diagnosis not present

## 2022-10-08 DIAGNOSIS — Z48812 Encounter for surgical aftercare following surgery on the circulatory system: Secondary | ICD-10-CM | POA: Diagnosis not present

## 2022-10-09 ENCOUNTER — Other Ambulatory Visit: Payer: Self-pay | Admitting: Family Medicine

## 2022-10-09 DIAGNOSIS — Z7982 Long term (current) use of aspirin: Secondary | ICD-10-CM | POA: Diagnosis not present

## 2022-10-09 DIAGNOSIS — Z9981 Dependence on supplemental oxygen: Secondary | ICD-10-CM | POA: Diagnosis not present

## 2022-10-09 DIAGNOSIS — Z48812 Encounter for surgical aftercare following surgery on the circulatory system: Secondary | ICD-10-CM | POA: Diagnosis not present

## 2022-10-09 DIAGNOSIS — J3801 Paralysis of vocal cords and larynx, unilateral: Secondary | ICD-10-CM | POA: Diagnosis not present

## 2022-10-09 DIAGNOSIS — R1314 Dysphagia, pharyngoesophageal phase: Secondary | ICD-10-CM | POA: Diagnosis not present

## 2022-10-09 DIAGNOSIS — G4733 Obstructive sleep apnea (adult) (pediatric): Secondary | ICD-10-CM | POA: Diagnosis not present

## 2022-10-09 DIAGNOSIS — J449 Chronic obstructive pulmonary disease, unspecified: Secondary | ICD-10-CM | POA: Diagnosis not present

## 2022-10-09 DIAGNOSIS — F17200 Nicotine dependence, unspecified, uncomplicated: Secondary | ICD-10-CM | POA: Diagnosis not present

## 2022-10-09 DIAGNOSIS — I10 Essential (primary) hypertension: Secondary | ICD-10-CM | POA: Diagnosis not present

## 2022-10-09 DIAGNOSIS — E038 Other specified hypothyroidism: Secondary | ICD-10-CM

## 2022-10-09 DIAGNOSIS — R490 Dysphonia: Secondary | ICD-10-CM | POA: Diagnosis not present

## 2022-10-09 DIAGNOSIS — E039 Hypothyroidism, unspecified: Secondary | ICD-10-CM | POA: Diagnosis not present

## 2022-10-09 DIAGNOSIS — F32A Depression, unspecified: Secondary | ICD-10-CM | POA: Diagnosis not present

## 2022-10-09 DIAGNOSIS — G8918 Other acute postprocedural pain: Secondary | ICD-10-CM | POA: Diagnosis not present

## 2022-10-09 NOTE — Telephone Encounter (Signed)
Requested Prescriptions  Pending Prescriptions Disp Refills   levothyroxine (SYNTHROID) 125 MCG tablet [Pharmacy Med Name: LEVOTHYROXINE SODIUM 125 MCG TAB] 90 tablet 0    Sig: TAKE 1 TABLET BY MOUTH DAILY BEFORE BREAKFAST.     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

## 2022-10-10 DIAGNOSIS — G8918 Other acute postprocedural pain: Secondary | ICD-10-CM | POA: Diagnosis not present

## 2022-10-10 DIAGNOSIS — J3801 Paralysis of vocal cords and larynx, unilateral: Secondary | ICD-10-CM | POA: Diagnosis not present

## 2022-10-10 DIAGNOSIS — Z7982 Long term (current) use of aspirin: Secondary | ICD-10-CM | POA: Diagnosis not present

## 2022-10-10 DIAGNOSIS — R490 Dysphonia: Secondary | ICD-10-CM | POA: Diagnosis not present

## 2022-10-10 DIAGNOSIS — G4733 Obstructive sleep apnea (adult) (pediatric): Secondary | ICD-10-CM | POA: Diagnosis not present

## 2022-10-10 DIAGNOSIS — F17200 Nicotine dependence, unspecified, uncomplicated: Secondary | ICD-10-CM | POA: Diagnosis not present

## 2022-10-10 DIAGNOSIS — R1314 Dysphagia, pharyngoesophageal phase: Secondary | ICD-10-CM | POA: Diagnosis not present

## 2022-10-10 DIAGNOSIS — J449 Chronic obstructive pulmonary disease, unspecified: Secondary | ICD-10-CM | POA: Diagnosis not present

## 2022-10-10 DIAGNOSIS — Z48812 Encounter for surgical aftercare following surgery on the circulatory system: Secondary | ICD-10-CM | POA: Diagnosis not present

## 2022-10-10 DIAGNOSIS — Z9981 Dependence on supplemental oxygen: Secondary | ICD-10-CM | POA: Diagnosis not present

## 2022-10-10 DIAGNOSIS — E039 Hypothyroidism, unspecified: Secondary | ICD-10-CM | POA: Diagnosis not present

## 2022-10-10 DIAGNOSIS — F32A Depression, unspecified: Secondary | ICD-10-CM | POA: Diagnosis not present

## 2022-10-10 DIAGNOSIS — I10 Essential (primary) hypertension: Secondary | ICD-10-CM | POA: Diagnosis not present

## 2022-10-11 DIAGNOSIS — Z48812 Encounter for surgical aftercare following surgery on the circulatory system: Secondary | ICD-10-CM | POA: Diagnosis not present

## 2022-10-11 DIAGNOSIS — G4733 Obstructive sleep apnea (adult) (pediatric): Secondary | ICD-10-CM | POA: Diagnosis not present

## 2022-10-11 DIAGNOSIS — E039 Hypothyroidism, unspecified: Secondary | ICD-10-CM | POA: Diagnosis not present

## 2022-10-11 DIAGNOSIS — I10 Essential (primary) hypertension: Secondary | ICD-10-CM | POA: Diagnosis not present

## 2022-10-11 DIAGNOSIS — F32A Depression, unspecified: Secondary | ICD-10-CM | POA: Diagnosis not present

## 2022-10-11 DIAGNOSIS — J3801 Paralysis of vocal cords and larynx, unilateral: Secondary | ICD-10-CM | POA: Diagnosis not present

## 2022-10-11 DIAGNOSIS — Z9981 Dependence on supplemental oxygen: Secondary | ICD-10-CM | POA: Diagnosis not present

## 2022-10-11 DIAGNOSIS — G8918 Other acute postprocedural pain: Secondary | ICD-10-CM | POA: Diagnosis not present

## 2022-10-11 DIAGNOSIS — F17200 Nicotine dependence, unspecified, uncomplicated: Secondary | ICD-10-CM | POA: Diagnosis not present

## 2022-10-11 DIAGNOSIS — Z7982 Long term (current) use of aspirin: Secondary | ICD-10-CM | POA: Diagnosis not present

## 2022-10-11 DIAGNOSIS — R1314 Dysphagia, pharyngoesophageal phase: Secondary | ICD-10-CM | POA: Diagnosis not present

## 2022-10-11 DIAGNOSIS — J449 Chronic obstructive pulmonary disease, unspecified: Secondary | ICD-10-CM | POA: Diagnosis not present

## 2022-10-11 DIAGNOSIS — R490 Dysphonia: Secondary | ICD-10-CM | POA: Diagnosis not present

## 2022-10-16 DIAGNOSIS — Z952 Presence of prosthetic heart valve: Secondary | ICD-10-CM | POA: Diagnosis not present

## 2022-10-16 DIAGNOSIS — G8918 Other acute postprocedural pain: Secondary | ICD-10-CM | POA: Diagnosis not present

## 2022-10-16 DIAGNOSIS — I10 Essential (primary) hypertension: Secondary | ICD-10-CM | POA: Diagnosis not present

## 2022-10-16 DIAGNOSIS — F32A Depression, unspecified: Secondary | ICD-10-CM | POA: Diagnosis not present

## 2022-10-16 DIAGNOSIS — Z7982 Long term (current) use of aspirin: Secondary | ICD-10-CM | POA: Diagnosis not present

## 2022-10-16 DIAGNOSIS — R490 Dysphonia: Secondary | ICD-10-CM | POA: Diagnosis not present

## 2022-10-16 DIAGNOSIS — R1314 Dysphagia, pharyngoesophageal phase: Secondary | ICD-10-CM | POA: Diagnosis not present

## 2022-10-16 DIAGNOSIS — F17200 Nicotine dependence, unspecified, uncomplicated: Secondary | ICD-10-CM | POA: Diagnosis not present

## 2022-10-16 DIAGNOSIS — Z9981 Dependence on supplemental oxygen: Secondary | ICD-10-CM | POA: Diagnosis not present

## 2022-10-16 DIAGNOSIS — E039 Hypothyroidism, unspecified: Secondary | ICD-10-CM | POA: Diagnosis not present

## 2022-10-16 DIAGNOSIS — J449 Chronic obstructive pulmonary disease, unspecified: Secondary | ICD-10-CM | POA: Diagnosis not present

## 2022-10-16 DIAGNOSIS — J3801 Paralysis of vocal cords and larynx, unilateral: Secondary | ICD-10-CM | POA: Diagnosis not present

## 2022-10-16 DIAGNOSIS — Z48812 Encounter for surgical aftercare following surgery on the circulatory system: Secondary | ICD-10-CM | POA: Diagnosis not present

## 2022-10-16 DIAGNOSIS — G4733 Obstructive sleep apnea (adult) (pediatric): Secondary | ICD-10-CM | POA: Diagnosis not present

## 2022-10-18 DIAGNOSIS — F17200 Nicotine dependence, unspecified, uncomplicated: Secondary | ICD-10-CM | POA: Diagnosis not present

## 2022-10-18 DIAGNOSIS — G4733 Obstructive sleep apnea (adult) (pediatric): Secondary | ICD-10-CM | POA: Diagnosis not present

## 2022-10-18 DIAGNOSIS — I10 Essential (primary) hypertension: Secondary | ICD-10-CM | POA: Diagnosis not present

## 2022-10-18 DIAGNOSIS — Z48812 Encounter for surgical aftercare following surgery on the circulatory system: Secondary | ICD-10-CM | POA: Diagnosis not present

## 2022-10-18 DIAGNOSIS — R1314 Dysphagia, pharyngoesophageal phase: Secondary | ICD-10-CM | POA: Diagnosis not present

## 2022-10-18 DIAGNOSIS — E039 Hypothyroidism, unspecified: Secondary | ICD-10-CM | POA: Diagnosis not present

## 2022-10-18 DIAGNOSIS — R490 Dysphonia: Secondary | ICD-10-CM | POA: Diagnosis not present

## 2022-10-18 DIAGNOSIS — J449 Chronic obstructive pulmonary disease, unspecified: Secondary | ICD-10-CM | POA: Diagnosis not present

## 2022-10-18 DIAGNOSIS — Z9981 Dependence on supplemental oxygen: Secondary | ICD-10-CM | POA: Diagnosis not present

## 2022-10-18 DIAGNOSIS — G8918 Other acute postprocedural pain: Secondary | ICD-10-CM | POA: Diagnosis not present

## 2022-10-18 DIAGNOSIS — J3801 Paralysis of vocal cords and larynx, unilateral: Secondary | ICD-10-CM | POA: Diagnosis not present

## 2022-10-18 DIAGNOSIS — F32A Depression, unspecified: Secondary | ICD-10-CM | POA: Diagnosis not present

## 2022-10-18 DIAGNOSIS — Z7982 Long term (current) use of aspirin: Secondary | ICD-10-CM | POA: Diagnosis not present

## 2022-10-21 DIAGNOSIS — Z48812 Encounter for surgical aftercare following surgery on the circulatory system: Secondary | ICD-10-CM | POA: Diagnosis not present

## 2022-10-21 DIAGNOSIS — G4733 Obstructive sleep apnea (adult) (pediatric): Secondary | ICD-10-CM | POA: Diagnosis not present

## 2022-10-21 DIAGNOSIS — Z7982 Long term (current) use of aspirin: Secondary | ICD-10-CM | POA: Diagnosis not present

## 2022-10-21 DIAGNOSIS — Z9981 Dependence on supplemental oxygen: Secondary | ICD-10-CM | POA: Diagnosis not present

## 2022-10-21 DIAGNOSIS — F17200 Nicotine dependence, unspecified, uncomplicated: Secondary | ICD-10-CM | POA: Diagnosis not present

## 2022-10-21 DIAGNOSIS — R1314 Dysphagia, pharyngoesophageal phase: Secondary | ICD-10-CM | POA: Diagnosis not present

## 2022-10-21 DIAGNOSIS — G8918 Other acute postprocedural pain: Secondary | ICD-10-CM | POA: Diagnosis not present

## 2022-10-21 DIAGNOSIS — F32A Depression, unspecified: Secondary | ICD-10-CM | POA: Diagnosis not present

## 2022-10-21 DIAGNOSIS — I10 Essential (primary) hypertension: Secondary | ICD-10-CM | POA: Diagnosis not present

## 2022-10-21 DIAGNOSIS — J449 Chronic obstructive pulmonary disease, unspecified: Secondary | ICD-10-CM | POA: Diagnosis not present

## 2022-10-21 DIAGNOSIS — R490 Dysphonia: Secondary | ICD-10-CM | POA: Diagnosis not present

## 2022-10-21 DIAGNOSIS — E039 Hypothyroidism, unspecified: Secondary | ICD-10-CM | POA: Diagnosis not present

## 2022-10-21 DIAGNOSIS — J3801 Paralysis of vocal cords and larynx, unilateral: Secondary | ICD-10-CM | POA: Diagnosis not present

## 2022-10-24 DIAGNOSIS — I10 Essential (primary) hypertension: Secondary | ICD-10-CM | POA: Diagnosis not present

## 2022-10-24 DIAGNOSIS — G4733 Obstructive sleep apnea (adult) (pediatric): Secondary | ICD-10-CM | POA: Diagnosis not present

## 2022-10-24 DIAGNOSIS — E039 Hypothyroidism, unspecified: Secondary | ICD-10-CM | POA: Diagnosis not present

## 2022-10-24 DIAGNOSIS — R1314 Dysphagia, pharyngoesophageal phase: Secondary | ICD-10-CM | POA: Diagnosis not present

## 2022-10-24 DIAGNOSIS — F32A Depression, unspecified: Secondary | ICD-10-CM | POA: Diagnosis not present

## 2022-10-24 DIAGNOSIS — Z48812 Encounter for surgical aftercare following surgery on the circulatory system: Secondary | ICD-10-CM | POA: Diagnosis not present

## 2022-10-24 DIAGNOSIS — Z7982 Long term (current) use of aspirin: Secondary | ICD-10-CM | POA: Diagnosis not present

## 2022-10-24 DIAGNOSIS — J3801 Paralysis of vocal cords and larynx, unilateral: Secondary | ICD-10-CM | POA: Diagnosis not present

## 2022-10-24 DIAGNOSIS — F17200 Nicotine dependence, unspecified, uncomplicated: Secondary | ICD-10-CM | POA: Diagnosis not present

## 2022-10-24 DIAGNOSIS — Z9981 Dependence on supplemental oxygen: Secondary | ICD-10-CM | POA: Diagnosis not present

## 2022-10-24 DIAGNOSIS — G8918 Other acute postprocedural pain: Secondary | ICD-10-CM | POA: Diagnosis not present

## 2022-10-24 DIAGNOSIS — R490 Dysphonia: Secondary | ICD-10-CM | POA: Diagnosis not present

## 2022-10-24 DIAGNOSIS — J449 Chronic obstructive pulmonary disease, unspecified: Secondary | ICD-10-CM | POA: Diagnosis not present

## 2022-10-27 DIAGNOSIS — J439 Emphysema, unspecified: Secondary | ICD-10-CM | POA: Diagnosis not present

## 2022-10-28 ENCOUNTER — Encounter: Payer: Self-pay | Admitting: *Deleted

## 2022-10-28 DIAGNOSIS — Z952 Presence of prosthetic heart valve: Secondary | ICD-10-CM | POA: Diagnosis not present

## 2022-10-28 NOTE — Progress Notes (Signed)
To: Dr. Zebedee Iba      Date:  10/28/2022  From: Cora Collum RN, BSN,  [facility]   Our Phone: [phone] Cardiac and Pulmonary Rehabilitation  Our Fax: [phone] [address] [place], [place] [address]  EXERCISE PARAMETERS FOR PATIENTS WITH A THORACIC DISSECTION SCHEDULED FOR REPAIR FEB. 2024          Nancy [name]   DOB 05-14-1961  BLOOD PRESSURE PARAMETERS: SYSTOLIC  DYSTOLIC EQUIPMENT:  please circle  Treadmill  Y N  If yes, indicate max.  Incline/grade _______  [employer] Bike Y N  Arm Crank  Y N  Recumbent Bike Y N  Track   [employer] (hand) Y N         If yes, please circle the maximum pound allowed       1    2     3     4     5     6     7     8    10     Please sign and return to [phone].  Thank you Cardiac Rehab Staff.  Signature/Date/Time

## 2022-11-07 ENCOUNTER — Encounter: Payer: BC Managed Care – PPO | Attending: Specialist | Admitting: *Deleted

## 2022-11-07 DIAGNOSIS — J449 Chronic obstructive pulmonary disease, unspecified: Secondary | ICD-10-CM

## 2022-11-07 NOTE — Progress Notes (Signed)
Initial phone call completed. Diagnosis can be found in [facility] 11/28. EP Orientation scheduled for Monday 1/8 at 3pm.

## 2022-11-11 HISTORY — PX: ABDOMINAL AORTA STENT: SHX1108

## 2022-11-13 DIAGNOSIS — Z952 Presence of prosthetic heart valve: Secondary | ICD-10-CM | POA: Diagnosis not present

## 2022-11-18 ENCOUNTER — Encounter: Payer: BC Managed Care – PPO | Attending: Specialist | Admitting: *Deleted

## 2022-11-18 VITALS — Ht 67.5 in | Wt 190.3 lb

## 2022-11-18 DIAGNOSIS — Z5189 Encounter for other specified aftercare: Secondary | ICD-10-CM | POA: Insufficient documentation

## 2022-11-18 DIAGNOSIS — J449 Chronic obstructive pulmonary disease, unspecified: Secondary | ICD-10-CM | POA: Diagnosis not present

## 2022-11-18 NOTE — Progress Notes (Signed)
Pulmonary Individual Treatment Plan  Patient Details  Name: Nancy [name] MRN: [id number] Date of Birth: 1961-09-05 Referring Provider:   Flowsheet Row Pulmonary Rehab from 11/18/2022 in [facility] Cardiac and Pulmonary Rehab  Referring Provider Meredeth Ide       Initial Encounter Date:  Flowsheet Row Pulmonary Rehab from 11/18/2022 in [facility] Cardiac and Pulmonary Rehab  Date 11/18/22       Visit Diagnosis: Stage 3 severe COPD by GOLD classification (HCC)  Patient's Home Medications on Admission:  Current Outpatient Medications:    albuterol (VENTOLIN HFA) 108 (90 Base) MCG/ACT inhaler, SMARTSIG:2 Inhalation Via Inhaler Every 6 Hours PRN, Disp: , Rfl:    amLODipine (NORVASC) 5 MG tablet, Take 5 mg by mouth daily., Disp: , Rfl:    aspirin EC 81 MG tablet, Take by mouth., Disp: , Rfl:    budesonide-formoterol (SYMBICORT) 160-4.5 MCG/ACT inhaler, Inhale 2 puffs into the lungs daily. , Disp: , Rfl:    carvedilol (COREG) 25 MG tablet, Take 25 mg by mouth 2 (two) times daily., Disp: , Rfl:    doxycycline (PERIOSTAT) 20 MG tablet, Take 1 tablet (20 mg total) by mouth 2 (two) times daily., Disp: 60 tablet, Rfl: 6   DULoxetine (CYMBALTA) 20 MG capsule, TAKE 2 CAPSULES BY MOUTH DAILY, Disp: 180 capsule, Rfl: 1   Fluticasone-Umeclidin-Vilant 100-62.5-25 MCG/ACT AEPB, SMARTSIG:1 Inhalation Via Inhaler Daily, Disp: , Rfl:    furosemide (LASIX) 40 MG tablet, Take 40 mg by mouth daily., Disp: , Rfl:    levothyroxine (SYNTHROID) 125 MCG tablet, TAKE 1 TABLET BY MOUTH DAILY BEFORE BREAKFAST., Disp: 90 tablet, Rfl: 0   LORazepam (ATIVAN) 0.5 MG tablet, Take 1 tablet (0.5 mg total) by mouth every 8 (eight) hours as needed (nausea, recommend 6am, 2pm, 10pm dosing). (Patient not taking: Reported on 11/07/2022), Disp: 90 tablet, Rfl: 0   ondansetron (ZOFRAN ODT) 4 MG disintegrating tablet, Take 1 tablet (4 mg total) by mouth every 8 (eight) hours as needed for nausea or vomiting. (Patient not taking: Reported on  11/07/2022), Disp: 20 tablet, Rfl: 2   spironolactone (ALDACTONE) 25 MG tablet, Take 25 mg by mouth daily., Disp: , Rfl:    topiramate (TOPAMAX) 50 MG tablet, Take 50 mg by mouth 2 (two) times daily. (Patient not taking: Reported on 12/03/2021), Disp: , Rfl:    valACYclovir (VALTREX) 1000 MG tablet, , Disp: , Rfl:    warfarin (COUMADIN) 2.5 MG tablet, Take 5 mg by mouth daily., Disp: , Rfl:   Current Facility-Administered Medications:    ipratropium-albuterol (DUONEB) 0.5-2.5 (3) MG/3ML nebulizer solution 3 mL, 3 mL, Nebulization, Once, Pollak, Adriana M, PA-C  Past Medical History: Past Medical History:  Diagnosis Date   Aortic dissection (HCC) 2010   COPD (chronic obstructive pulmonary disease) (HCC)    Depression    Hypertension    Hypothyroidism    Pneumonia    PONV (postoperative nausea and vomiting)    Rosacea    Seasonal allergies    Sleep apnea    Does not use CPAP    Tobacco Use: Social History   Tobacco Use  Smoking Status Former   Packs/day: 0.50   Years: 30.00   Total pack years: 15.00   Types: Cigarettes   Quit date: 03/11/2016   Years since quitting: 6.6  Smokeless Tobacco Never  Tobacco Comments   patient states  that she is working at her own pace to quit smoking    Labs: Review Flowsheet       Latest Ref Rng &  Units 07/02/2009 08/29/2015 01/05/2018 09/29/2020  Labs for ITP Cardiac and Pulmonary Rehab  Cholestrol 100 - 199 mg/dL - 161176  [id number]  [id number]   LDL (calc) 0 - 99 mg/dL - 409103  811111  914101   HDL-C >39 mg/dL - 53  50  45   Trlycerides 0 - 149 mg/dL - 782102  956119  213116   TCO2 0 - 100 mmol/L 22  - - -     Pulmonary Assessment Scores:  Pulmonary Assessment Scores     Row Name 11/18/22 1739         ADL UCSD   SOB Score total 36     Rest 0     Walk 4     Stairs 4     Bath 3     Dress 1     Shop 3       CAT Score   CAT Score 16       mMRC Score   mMRC Score 2.5              UCSD: Self-administered rating of dyspnea associated with  activities of daily living (ADLs) 6-point scale (0 = "not at all" to 5 = "maximal or unable to do because of breathlessness")  Scoring Scores range from 0 to 120.  Minimally important difference is 5 units  CAT: CAT can identify the health impairment of COPD patients and is better correlated with disease progression.  CAT has a scoring range of zero to 40. The CAT score is classified into four groups of low (less than 10), medium (10 - 20), high (21-30) and very high (31-40) based on the impact level of disease on health status. A CAT score over 10 suggests significant symptoms.  A worsening CAT score could be explained by an exacerbation, poor medication adherence, poor inhaler technique, or progression of COPD or comorbid conditions.  CAT MCID is 2 points  mMRC: mMRC (Modified Medical Research Council) Dyspnea Scale is used to assess the degree of baseline functional disability in patients of respiratory disease due to dyspnea. No minimal important difference is established. A decrease in score of 1 point or greater is considered a positive change.   Pulmonary Function Assessment:  Pulmonary Function Assessment - 11/18/22 1734       Initial Spirometry Results   FVC% 72 %    FEV1% 34 %    FEV1/FVC Ratio 38      Post Bronchodilator Spirometry Results   FVC% 79 %    FEV1% 37 %    FEV1/FVC Ratio 38             Exercise Target Goals: Exercise Program Goal: Individual exercise prescription set using results from initial 6 min walk test and THRR while considering  patient's activity barriers and safety.   Exercise Prescription Goal: Initial exercise prescription builds to 30-45 minutes a day of aerobic activity, 2-3 days per week.  Home exercise guidelines will be given to patient during program as part of exercise prescription that the participant will acknowledge.  Education: Aerobic Exercise: - Group verbal and visual presentation on the components of exercise prescription.  Introduces F.I.T.T principle from ACSM for exercise prescriptions.  Reviews F.I.T.T. principles of aerobic exercise including progression. Written material given at graduation.   Education: Resistance Exercise: - Group verbal and visual presentation on the components of exercise prescription. Introduces F.I.T.T principle from ACSM for exercise prescriptions  Reviews F.I.T.T. principles of resistance exercise including progression. Written material given at  graduation.    Education: Exercise & Equipment Safety: - Individual verbal instruction and demonstration of equipment use and safety with use of the equipment. Flowsheet [facility]  Date 11/18/22  [facility]  Instruction Review Code 1- Verbalizes Understanding       Education: Exercise Physiology & General Exercise Guidelines: - Group verbal and written instruction with models to review the exercise physiology of the cardiovascular system and associated critical values. Provides general exercise guidelines with specific guidelines to those with heart or lung disease.    Education: Flexibility, Balance, Mind/Body Relaxation: - Group verbal and visual presentation with interactive activity on the components of exercise prescription. Introduces F.I.T.T principle from ACSM for exercise prescriptions. Reviews F.I.T.T. principles of flexibility and balance exercise training including progression. Also discusses the mind body connection.  Reviews various relaxation techniques to help reduce and manage stress (i.e. Deep breathing, progressive muscle relaxation, and visualization). Balance handout provided to take home. Written material given at graduation.   Activity Barriers & Risk Stratification:  Activity Barriers & Cardiac Risk Stratification - 11/18/22 1716       Activity Barriers & Cardiac Risk Stratification   Activity Barriers None             6 Minute Walk:  6  Minute Walk     Row Name 11/18/22 1708         6 Minute Walk   Phase Initial     Distance 580 feet     Walk Time 4.5 minutes     # of Rest Breaks 4     MPH 1.46     METS 2.3     RPE 14     Perceived Dyspnea  3     VO2 Peak 8.04     Symptoms Yes (comment)  SOB     Resting HR 89 bpm     Resting BP 138/84     Resting Oxygen Saturation  93 %     Exercise Oxygen Saturation  during 6 min walk 94 %     Max Ex. HR 104 bpm     Max Ex. BP 146/72     2 Minute Post BP 122/60       Interval HR   1 Minute HR 99     2 Minute HR 104     3 Minute HR 95     4 Minute HR 95     5 Minute HR 95     6 Minute HR 98     2 Minute Post HR 98     Interval Heart Rate? Yes       Interval Oxygen   Interval Oxygen? Yes     Baseline Oxygen Saturation % 93 %     1 Minute Oxygen Saturation % 99 %     1 Minute Liters of Oxygen 2 L     2 Minute Oxygen Saturation % 104 %     2 Minute Liters of Oxygen 2 L     3 Minute Oxygen Saturation % 95 %     3 Minute Liters of Oxygen 2 L     4 Minute Oxygen Saturation % 95 %     4 Minute Liters of Oxygen 2 L     5 Minute Oxygen Saturation % 95 %     5 Minute Liters of Oxygen 2 L     6 Minute Oxygen Saturation % 98 %  6 Minute Liters of Oxygen 2 L     2 Minute Post Oxygen Saturation % 98 %     2 Minute Post Liters of Oxygen 2 L             Oxygen Initial Assessment:  Oxygen Initial Assessment - 11/18/22 1732       Home Oxygen   Home Oxygen Device Portable Concentrator    Sleep Oxygen Prescription Continuous    Home Exercise Oxygen Prescription --    Liters per minute 2    Home Resting Oxygen Prescription --    Liters per minute 2    Compliance with Home Oxygen Use No      Initial 6 min Walk   Oxygen Used Continuous    Liters per minute 2      Program Oxygen Prescription   Program Oxygen Prescription Continuous    Liters per minute 2      Intervention   Short Term Goals To learn and understand importance of monitoring SPO2 with pulse  oximeter and demonstrate accurate use of the pulse oximeter.;To learn and understand importance of maintaining oxygen saturations>88%;To learn and demonstrate proper pursed lip breathing techniques or other breathing techniques. ;To learn and demonstrate proper use of respiratory medications    Long  Term Goals Verbalizes importance of monitoring SPO2 with pulse oximeter and return demonstration;Maintenance of O2 saturations>88%;Exhibits proper breathing techniques, such as pursed lip breathing or other method taught during program session;Compliance with respiratory medication             Oxygen Re-Evaluation:   Oxygen Discharge (Final Oxygen Re-Evaluation):   Initial Exercise Prescription:  Initial Exercise Prescription - 11/18/22 1700       Date of Initial Exercise RX and Referring Provider   Date 11/18/22    Referring Provider Meredeth IdeFleming      Oxygen   Oxygen Continuous    Liters 2    Maintain Oxygen Saturation 88% or higher      Treadmill   MPH 0.8    Grade 0    Minutes 15    METs 1      Recumbant Bike   Level 1    RPM 50    Minutes 15    METs 1.46      NuStep   Level 1    SPM 80    Minutes 15    METs 1.46      REL-XR   Level 1    Speed 50    Minutes 15    METs 1.46      T5 Nustep   Level 1    SPM 80    Minutes 15    METs 2.3      Track   Laps 10    Minutes 15    METs 1.54      Prescription Details   Frequency (times per week) 3    Duration Progress to 30 minutes of continuous aerobic without signs/symptoms of physical distress      Intensity   THRR 40-80% of Max Heartrate 117-146    Perceived Dyspnea 0-4      Progression   Progression Continue to progress workloads to maintain intensity without signs/symptoms of physical distress.      Resistance Training   Training Prescription Yes    Weight 2    Reps 10-15             Perform Capillary Blood Glucose checks as needed.  Exercise Prescription Changes:  Exercise Prescription  Changes     Row Name 11/18/22 1700             Response to Exercise   Blood Pressure (Admit) 138/84       Blood Pressure (Exercise) 146/72       Blood Pressure (Exit) 122/60       Heart Rate (Admit) 89 bpm       Heart Rate (Exercise) 104 bpm       Heart Rate (Exit) 98 bpm       Oxygen Saturation (Admit) 93 %       Oxygen Saturation (Exercise) 94 %       Oxygen Saturation (Exit) 98 %       Rating of Perceived Exertion (Exercise) 14       Perceived Dyspnea (Exercise) 3       Symptoms SOB       Comments 6 MWT results                Exercise Comments:   Exercise Goals and Review:   Exercise Goals     Row Name 11/18/22 1722             Exercise Goals   Increase Physical Activity Yes       Intervention Provide advice, education, support and counseling about physical activity/exercise needs.;Develop an individualized exercise prescription for aerobic and resistive training based on initial evaluation findings, risk stratification, comorbidities and participant's personal goals.       Expected Outcomes Short Term: Attend rehab on a regular basis to increase amount of physical activity.;Long Term: Add in home exercise to make exercise part of routine and to increase amount of physical activity.;Long Term: Exercising regularly at least 3-5 days a week.       Increase Strength and Stamina Yes       Intervention Provide advice, education, support and counseling about physical activity/exercise needs.;Develop an individualized exercise prescription for aerobic and resistive training based on initial evaluation findings, risk stratification, comorbidities and participant's personal goals.       Expected Outcomes Short Term: Increase workloads from initial exercise prescription for resistance, speed, and METs.;Short Term: Perform resistance training exercises routinely during rehab and add in resistance training at home;Long Term: Improve cardiorespiratory fitness, muscular endurance  and strength as measured by increased METs and functional capacity (6MWT)       Able to understand and use rate of perceived exertion (RPE) scale Yes       Intervention Provide education and explanation on how to use RPE scale       Expected Outcomes Short Term: Able to use RPE daily in rehab to express subjective intensity level;Long Term:  Able to use RPE to guide intensity level when exercising independently       Able to understand and use Dyspnea scale Yes       Intervention Provide education and explanation on how to use Dyspnea scale       Expected Outcomes Short Term: Able to use Dyspnea scale daily in rehab to express subjective sense of shortness of breath during exertion;Long Term: Able to use Dyspnea scale to guide intensity level when exercising independently       Knowledge and understanding of Target Heart Rate Range (THRR) Yes       Intervention Provide education and explanation of THRR including how the numbers were predicted and where they are located for reference       Expected Outcomes Short Term: Able to  state/look up THRR;Long Term: Able to use THRR to govern intensity when exercising independently;Short Term: Able to use daily as guideline for intensity in rehab       Able to check pulse independently Yes       Intervention Provide education and demonstration on how to check pulse in carotid and radial arteries.;Review the importance of being able to check your own pulse for safety during independent exercise       Expected Outcomes Short Term: Able to explain why pulse checking is important during independent exercise       Understanding of Exercise Prescription Yes       Intervention Provide education, explanation, and written materials on patient's individual exercise prescription       Expected Outcomes Short Term: Able to explain program exercise prescription;Long Term: Able to explain home exercise prescription to exercise independently                Exercise  Goals Re-Evaluation :   Discharge Exercise Prescription (Final Exercise Prescription Changes):  Exercise Prescription Changes - 11/18/22 1700       Response to Exercise   Blood Pressure (Admit) 138/84    Blood Pressure (Exercise) 146/72    Blood Pressure (Exit) 122/60    Heart Rate (Admit) 89 bpm    Heart Rate (Exercise) 104 bpm    Heart Rate (Exit) 98 bpm    Oxygen Saturation (Admit) 93 %    Oxygen Saturation (Exercise) 94 %    Oxygen Saturation (Exit) 98 %    Rating of Perceived Exertion (Exercise) 14    Perceived Dyspnea (Exercise) 3    Symptoms SOB    Comments 6 MWT results             Nutrition:  Target Goals: Understanding of nutrition guidelines, daily intake of sodium 1500mg , cholesterol 200mg , calories 30% from fat and 7% or less from saturated fats, daily to have 5 or more servings of fruits and vegetables.  Education: All About Nutrition: -Group instruction provided by verbal, written material, interactive activities, discussions, models, and posters to present general guidelines for heart healthy nutrition including fat, fiber, MyPlate, the role of sodium in heart healthy nutrition, utilization of the nutrition label, and utilization of this knowledge for meal planning. Follow up email sent as well. Written material given at graduation. Flowsheet Row Pulmonary Rehab from 11/18/2022 in [facility] Cardiac and Pulmonary Rehab  Education need identified 11/18/22       Biometrics:  Pre Biometrics - 11/18/22 1722       Pre Biometrics   Height 5' 7.5" (1.715 m)    Weight 190 lb 4.8 oz (86.3 kg)    Waist Circumference 40 inches    Hip Circumference 44 inches    Waist to Hip Ratio 0.91 %    BMI (Calculated) 29.35    Single Leg Stand 2.66 seconds              Nutrition Therapy Plan and Nutrition Goals:  Nutrition Therapy & Goals - 11/18/22 1723       Intervention Plan   Intervention Prescribe, educate and counsel regarding individualized specific dietary  modifications aiming towards targeted core components such as weight, hypertension, lipid management, diabetes, heart failure and other comorbidities.    Expected Outcomes Short Term Goal: Understand basic principles of dietary content, such as calories, fat, sodium, cholesterol and nutrients.;Short Term Goal: A plan has been developed with personal nutrition goals set during dietitian appointment.;Long Term Goal: Adherence to prescribed  nutrition plan.             Nutrition Assessments:  MEDIFICTS Score Key: ?70 Need to make dietary changes  40-70 Heart Healthy Diet ? 40 Therapeutic Level Cholesterol Diet  Flowsheet Row Pulmonary Rehab from 11/18/2022 in [facility] Cardiac and Pulmonary Rehab  Picture Your Plate Total Score on Admission 41      Picture Your Plate Scores: <97 Unhealthy dietary pattern with much room for improvement. 41-50 Dietary pattern unlikely to meet recommendations for good health and room for improvement. 51-60 More healthful dietary pattern, with some room for improvement.  >60 Healthy dietary pattern, although there may be some specific behaviors that could be improved.   Nutrition Goals Re-Evaluation:   Nutrition Goals Discharge (Final Nutrition Goals Re-Evaluation):   Psychosocial: Target Goals: Acknowledge presence or absence of significant depression and/or stress, maximize coping skills, provide positive support system. Participant is able to verbalize types and ability to use techniques and skills needed for reducing stress and depression.   Education: Stress, Anxiety, and Depression - Group verbal and visual presentation to define topics covered.  Reviews how body is impacted by stress, anxiety, and depression.  Also discusses healthy ways to reduce stress and to treat/manage anxiety and depression.  Written material given at graduation.   Education: Sleep Hygiene -Provides group verbal and written instruction about how sleep can affect your health.   Define sleep hygiene, discuss sleep cycles and impact of sleep habits. Review good sleep hygiene tips.    Initial Review & Psychosocial Screening:  Initial Psych Review & Screening - 11/07/22 1437       Initial Review   Current issues with Current Depression      Family Dynamics   Good Support System? Yes   sister, family     Screening Interventions   Interventions Encouraged to exercise;Provide feedback about the scores to participant    Expected Outcomes Short Term goal: Utilizing psychosocial counselor, staff and physician to assist with identification of specific Stressors or current issues interfering with healing process. Setting desired goal for each stressor or current issue identified.;Long Term Goal: Stressors or current issues are controlled or eliminated.;Short Term goal: Identification and review with participant of any Quality of Life or Depression concerns found by scoring the questionnaire.;Long Term goal: The participant improves quality of Life and PHQ9 Scores as seen by post scores and/or verbalization of changes             Quality of Life Scores:  Scores of 19 and below usually indicate a poorer quality of life in these areas.  A difference of  2-3 points is a clinically meaningful difference.  A difference of 2-3 points in the total score of the Quality of Life Index has been associated with significant improvement in overall quality of life, self-image, physical symptoms, and general health in studies assessing change in quality of life.  PHQ-9: Review Flowsheet  More data may exist      11/18/2022 09/21/2021 09/18/2020 12/13/2019 01/02/2018  Depression screen PHQ 2/9  Decreased Interest 1 0 0 0 1  Down, Depressed, Hopeless 0 0 0 0 0  PHQ - 2 Score 1 0 0 0 1  Altered sleeping 0 2 - 0 -  Tired, decreased energy 3 3 - 0 -  Change in appetite 0 0 - 0 -  Feeling bad or failure about yourself  0 0 - 0 -  Trouble concentrating 0 0 - 0 -  Moving slowly or  fidgety/restless 0  0 - 0 -  Suicidal thoughts 0 0 - 0 -  PHQ-9 Score 4 5 - 0 -  Difficult doing work/chores - Not difficult at all - Not difficult at all -   Interpretation of Total Score  Total Score Depression Severity:  1-4 = Minimal depression, 5-9 = Mild depression, 10-14 = Moderate depression, 15-19 = Moderately severe depression, 20-27 = Severe depression   Psychosocial Evaluation and Intervention:  Psychosocial Evaluation - 11/07/22 1442       Psychosocial Evaluation & Interventions   Interventions Encouraged to exercise with the program and follow exercise prescription    Comments Carroll is coming to pulmonary rehab for COPD. She is on cymbalta and reports it is working well. 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 She recently got a concentrator but it was unclear over the phone as to her oxygen use. She reports feeling like she is managing her health well independenlty and also has a great support system of family close by.    Expected Outcomes Short: attend pulmonary rehab for education and exercise. Long: develop and maintain positive self care habits.    Continue Psychosocial Services  Follow up required by staff             Psychosocial Re-Evaluation:   Psychosocial Discharge (Final Psychosocial Re-Evaluation):   Education: Education Goals: Education classes will be provided on a weekly basis, covering required topics. Participant will state understanding/return demonstration of topics presented.  Learning Barriers/Preferences:  Learning Barriers/Preferences - 11/07/22 1453       Learning Barriers/Preferences   Learning Barriers None    Learning Preferences Individual Instruction             General Pulmonary Education Topics:  Infection Prevention: - Provides verbal and written material to individual with discussion of infection control including proper hand washing and proper equipment cleaning during exercise  session. Flowsheet Row Pulmonary Rehab from 11/18/2022 in [facility] Cardiac and Pulmonary Rehab  Date 11/18/22  Educator [facility]  Instruction Review Code 1- Verbalizes Understanding       Falls Prevention: - Provides verbal and written material to individual with discussion of falls prevention and safety. Flowsheet Row Pulmonary Rehab from 11/18/2022 in [facility] Cardiac and Pulmonary Rehab  Date 11/18/22  Educator [facility]  Instruction Review Code 1- Verbalizes Understanding       Chronic Lung Disease Review: - Group verbal instruction with posters, models, PowerPoint presentations and videos,  to review new updates, new respiratory medications, new advancements in procedures and treatments. Providing information on websites and "800" numbers for continued self-education. Includes information about supplement oxygen, available portable oxygen systems, continuous and intermittent flow rates, oxygen safety, concentrators, and Medicare reimbursement for oxygen. Explanation of Pulmonary Drugs, including class, frequency, complications, importance of spacers, rinsing mouth after steroid MDI's, and proper cleaning methods for nebulizers. Review of basic lung anatomy and physiology related to function, structure, and complications of lung disease. Review of risk factors. Discussion about methods for diagnosing sleep apnea and types of masks and machines for OSA. Includes a review of the use of types of environmental controls: home humidity, furnaces, filters, dust mite/pet prevention, HEPA vacuums. Discussion about weather changes, air quality and the benefits of nasal washing. Instruction on Warning signs, infection symptoms, calling MD promptly, preventive modes, and value of vaccinations. Review of effective airway clearance, coughing and/or vibration techniques. Emphasizing that all should Create an Action Plan. Written material given at graduation. Flowsheet Row Pulmonary Rehab from 11/18/2022  in [facility] Cardiac and Pulmonary  Rehab  Education need identified 11/18/22       AED/CPR: - Group verbal and written instruction with the use of models to demonstrate the basic use of the AED with the basic ABC's of resuscitation.    Anatomy and Cardiac Procedures: - Group verbal and visual presentation and models provide information about basic cardiac anatomy and function. Reviews the testing methods done to diagnose heart disease and the outcomes of the test results. Describes the treatment choices: Medical Management, Angioplasty, or Coronary Bypass Surgery for treating various heart conditions including Myocardial Infarction, Angina, Valve Disease, and Cardiac Arrhythmias.  Written material given at graduation.   Medication Safety: - Group verbal and visual instruction to review commonly prescribed medications for heart and lung disease. Reviews the medication, class of the drug, and side effects. Includes the steps to properly store meds and maintain the prescription regimen.  Written material given at graduation.   Other: -Provides group and verbal instruction on various topics (see comments)   Knowledge Questionnaire Score:  Knowledge Questionnaire Score - 11/18/22 1729       Knowledge Questionnaire Score   Pre Score 16/18              Core Components/Risk Factors/Patient Goals at Admission:  Personal Goals and Risk Factors at Admission - 11/18/22 1723       Core Components/Risk Factors/Patient Goals on Admission    Weight Management Yes    Intervention Weight Management: Develop a combined nutrition and exercise program designed to reach desired caloric intake, while maintaining appropriate intake of nutrient and fiber, sodium and fats, and appropriate energy expenditure required for the weight goal.;Weight Management: Provide education and appropriate resources to help participant work on and attain dietary goals.;Weight Management/Obesity: Establish reasonable short term and long term weight  goals.    Admit Weight 190 lb 4.8 oz (86.3 kg)    Goal Weight: Short Term 185 lb (83.9 kg)    Goal Weight: Long Term 180 lb (81.6 kg)    Expected Outcomes Short Term: Continue to assess and modify interventions until short term weight is achieved;Long Term: Adherence to nutrition and physical activity/exercise program aimed toward attainment of established weight goal;Weight Loss: Understanding of general recommendations for a balanced deficit meal plan, which promotes 1-2 lb weight loss per week and includes a negative energy balance of [phone] kcal/d;Understanding recommendations for meals to include 15-35% energy as protein, 25-35% energy from fat, 35-60% energy from carbohydrates, less than 200mg  of dietary cholesterol, 20-35 gm of total fiber daily;Understanding of distribution of calorie intake throughout the day with the consumption of 4-5 meals/snacks    Hypertension Yes    Intervention Provide education on lifestyle modifcations including regular physical activity/exercise, weight management, moderate sodium restriction and increased consumption of fresh fruit, vegetables, and low fat dairy, alcohol moderation, and smoking cessation.;Monitor prescription use compliance.    Expected Outcomes Short Term: Continued assessment and intervention until BP is < 140/24mm HG in hypertensive participants. < 130/46mm HG in hypertensive participants with diabetes, heart failure or chronic kidney disease.;Long Term: Maintenance of blood pressure at goal levels.             Education:Diabetes - Individual verbal and written instruction to review signs/symptoms of diabetes, desired ranges of glucose level fasting, after meals and with exercise. Acknowledge that pre and post exercise glucose checks will be done for 3 sessions at entry of program.   Know Your Numbers and Heart Failure: - Group verbal  and visual instruction to discuss disease risk factors for cardiac and pulmonary disease and treatment  options.  Reviews associated critical values for Overweight/Obesity, Hypertension, Cholesterol, and Diabetes.  Discusses basics of heart failure: signs/symptoms and treatments.  Introduces Heart Failure Zone chart for action plan for heart failure.  Written material given at graduation.   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 Initial phone call completed. Diagnosis can be found in [facility] 11/28. EP Orientation scheduled for Monday 1/8 at 3pm. Completed and gym orientation. Initial ITP created and sent for review to Dr. Jinny Sanders, Medical Director.              Comments: initial ITP

## 2022-11-18 NOTE — Patient Instructions (Signed)
Patient Instructions  Patient Details  Name: Nancy [name] MRN: [id number] Date of Birth: [birth year] Referring Provider:  Erby Pian, MD  Below are your personal goals for exercise, nutrition, and risk factors. Our goal is to help you stay on track towards obtaining and maintaining these goals. We will be discussing your progress on these goals with you throughout the program.  Initial Exercise Prescription:  Initial Exercise Prescription - 11/18/22 1700       Date of Initial Exercise RX and Referring Provider   Date 11/18/22    Referring Provider [name]      Oxygen   Oxygen Continuous    Liters 2    Maintain Oxygen Saturation 88% or higher      Treadmill   MPH 0.8    Grade 0    Minutes 15    METs 1      Recumbant Bike   Level 1    RPM 50    Minutes 15    METs 1.46      NuStep   Level 1    SPM 80    Minutes 15    METs 1.46      REL-XR   Level 1    Speed 50    Minutes 15    METs 1.46      T5 Nustep   Level 1    SPM 80    Minutes 15    METs 2.3      Track   Laps 10    Minutes 15    METs 1.54      Prescription Details   Frequency (times per week) 3    Duration Progress to 30 minutes of continuous aerobic without signs/symptoms of physical distress      Intensity   THRR 40-80% of Max Heartrate 117-146    Perceived Dyspnea 0-4      Progression   Progression Continue to progress workloads to maintain intensity without signs/symptoms of physical distress.      Resistance Training   Training Prescription Yes    Weight 2    Reps 10-15             Exercise Goals: Frequency: Be able to perform aerobic exercise two to three times per week in program working toward 2-5 days per week of home exercise.  Intensity: Work with a perceived exertion of 11 (fairly light) - 15 (hard) while following your exercise prescription.  We will make changes to your prescription with you as you progress through the program.   Duration: Be able to do 30 to 45  minutes of continuous aerobic exercise in addition to a 5 minute warm-up and a 5 minute cool-down routine.   Nutrition Goals: Your personal nutrition goals will be established when you do your nutrition analysis with the dietician.  The following are general nutrition guidelines to follow: Cholesterol < 200mg /day Sodium < 1500mg /day Fiber: Men over 50 yrs - 30 grams per day  Personal Goals:  Personal Goals and Risk Factors at Admission - 11/18/22 1723       Core Components/Risk Factors/Patient Goals on Admission    Weight Management Yes    Intervention Weight Management: Develop a combined nutrition and exercise program designed to reach desired caloric intake, while maintaining appropriate intake of nutrient and fiber, sodium and fats, and appropriate energy expenditure required for the weight goal.;Weight Management: Provide education and appropriate resources to help participant work on and attain dietary goals.;Weight Management/Obesity: Establish reasonable short  term and long term weight goals.    Admit Weight 190 lb 4.8 oz (86.3 kg)    Goal Weight: Short Term 185 lb (83.9 kg)    Goal Weight: Long Term 180 lb (81.6 kg)    Expected Outcomes Short Term: Continue to assess and modify interventions until short term weight is achieved;Long Term: Adherence to nutrition and physical activity/exercise program aimed toward attainment of established weight goal;Weight Loss: Understanding of general recommendations for a balanced deficit meal plan, which promotes 1-2 lb weight loss per week and includes a negative energy balance of [phone] kcal/d;Understanding recommendations for meals to include 15-35% energy as protein, 25-35% energy from fat, 35-60% energy from carbohydrates, less than 200mg  of dietary cholesterol, 20-35 gm of total fiber daily;Understanding of distribution of calorie intake throughout the day with the consumption of 4-5 meals/snacks    Hypertension Yes    Intervention Provide  education on lifestyle modifcations including regular physical activity/exercise, weight management, moderate sodium restriction and increased consumption of fresh fruit, vegetables, and low fat dairy, alcohol moderation, and smoking cessation.;Monitor prescription use compliance.    Expected Outcomes Short Term: Continued assessment and intervention until BP is < 140/19mm HG in hypertensive participants. < 130/72mm HG in hypertensive participants with diabetes, heart failure or chronic kidney disease.;Long Term: Maintenance of blood pressure at goal levels.             Tobacco Use Initial Evaluation: Social History   Tobacco Use  Smoking Status Former   Packs/day: 0.50   Years: 30.00   Total pack years: 15.00   Types: Cigarettes   Quit date: 03/11/2016   Years since quitting: 6.6  Smokeless Tobacco Never  Tobacco Comments   patient states  that she is working at her own pace to quit smoking    Exercise Goals and Review:  Exercise Goals     Row Name 11/18/22 1722             Exercise Goals   Increase Physical Activity Yes       Intervention Provide advice, education, support and counseling about physical activity/exercise needs.;Develop an individualized exercise prescription for aerobic and resistive training based on initial evaluation findings, risk stratification, comorbidities and participant's personal goals.       Expected Outcomes Short Term: Attend rehab on a regular basis to increase amount of physical activity.;Long Term: Add in home exercise to make exercise part of routine and to increase amount of physical activity.;Long Term: Exercising regularly at least 3-5 days a week.       Increase Strength and Stamina Yes       Intervention Provide advice, education, support and counseling about physical activity/exercise needs.;Develop an individualized exercise prescription for aerobic and resistive training based on initial evaluation findings, risk stratification,  comorbidities and participant's personal goals.       Expected Outcomes Short Term: Increase workloads from initial exercise prescription for resistance, speed, and METs.;Short Term: Perform resistance training exercises routinely during rehab and add in resistance training at home;Long Term: Improve cardiorespiratory fitness, muscular endurance and strength as measured by increased METs and functional capacity (01/17/23)       Able to understand and use rate of perceived exertion (RPE) scale Yes       Intervention Provide education and explanation on how to use RPE scale       Expected Outcomes Short Term: Able to use RPE daily in rehab to express subjective intensity level;Long Term:  Able to use RPE to guide  intensity level when exercising independently       Able to understand and use Dyspnea scale Yes       Intervention Provide education and explanation on how to use Dyspnea scale       Expected Outcomes Short Term: Able to use Dyspnea scale daily in rehab to express subjective sense of shortness of breath during exertion;Long Term: Able to use Dyspnea scale to guide intensity level when exercising independently       Knowledge and understanding of Target Heart Rate Range (THRR) Yes       Intervention Provide education and explanation of THRR including how the numbers were predicted and where they are located for reference       Expected Outcomes Short Term: Able to state/look up THRR;Long Term: Able to use THRR to govern intensity when exercising independently;Short Term: Able to use daily as guideline for intensity in rehab       Able to check pulse independently Yes       Intervention Provide education and demonstration on how to check pulse in carotid and radial arteries.;Review the importance of being able to check your own pulse for safety during independent exercise       Expected Outcomes Short Term: Able to explain why pulse checking is important during independent exercise        Understanding of Exercise Prescription Yes       Intervention Provide education, explanation, and written materials on patient's individual exercise prescription       Expected Outcomes Short Term: Able to explain program exercise prescription;Long Term: Able to explain home exercise prescription to exercise independently                Copy of goals given to participant.

## 2022-11-21 ENCOUNTER — Encounter: Payer: BC Managed Care – PPO | Admitting: *Deleted

## 2022-11-21 DIAGNOSIS — J449 Chronic obstructive pulmonary disease, unspecified: Secondary | ICD-10-CM | POA: Diagnosis not present

## 2022-11-21 DIAGNOSIS — Z5189 Encounter for other specified aftercare: Secondary | ICD-10-CM | POA: Diagnosis not present

## 2022-11-21 NOTE — Progress Notes (Signed)
Daily Session Note  Patient Details  Name: DELAILA NAND MRN: [id number] Date of Birth: July 14, 1961 Referring Provider:   Flowsheet Row Pulmonary Rehab from 11/18/2022 in [facility] Cardiac and Pulmonary Rehab  Referring Provider [name]       Encounter Date: 11/21/2022  Check In:  Session Check In - 11/21/22 1606       Check-In   Supervising physician immediately available to respond to emergencies See telemetry face sheet for immediately available ER MD    Location [facility]-Cardiac & Pulmonary Rehab    Staff Present Renita Papa, RN BSN;Joseph [place], RCP,RRT,BSRT;Noah Coin, [place], Exercise Physiologist    Medication changes reported     No    Fall or balance concerns reported    No    Warm-up and Cool-down Performed on first and last piece of equipment    Resistance Training Performed Yes    VAD Patient? No    PAD/SET Patient? No      Pain Assessment   Currently in Pain? No/denies                Social History   Tobacco Use  Smoking Status Former   Packs/day: 0.50   Years: 30.00   Total pack years: 15.00   Types: Cigarettes   Quit date: 03/11/2016   Years since quitting: 6.7  Smokeless Tobacco Never  Tobacco Comments   patient states  that she is working at her own pace to quit smoking    Goals Met:  Independence with exercise equipment Exercise tolerated well No report of concerns or symptoms today Strength training completed today  Goals Unmet:  Not Applicable  Comments: First full day of exercise!  Patient was oriented to gym and equipment including functions, settings, policies, and procedures.  Patient's individual exercise prescription and treatment plan were reviewed.  All starting workloads were established based on the results of the 6 minute walk test done at initial orientation visit.  The plan for exercise progression was also introduced and progression will be customized based on patient's performance and goals.    Dr. Emily Filbert is Medical Director  for [place].  Dr. Ottie Glazier is Medical Director for [facility] Pulmonary Rehabilitation.

## 2022-11-25 ENCOUNTER — Encounter: Payer: BC Managed Care – PPO | Admitting: *Deleted

## 2022-11-25 DIAGNOSIS — J449 Chronic obstructive pulmonary disease, unspecified: Secondary | ICD-10-CM

## 2022-11-25 DIAGNOSIS — Z5189 Encounter for other specified aftercare: Secondary | ICD-10-CM | POA: Diagnosis not present

## 2022-11-25 NOTE — Progress Notes (Signed)
Daily Session Note  Patient Details  Name: Nancy [name] MRN: [id number] Date of Birth: 1961-01-28 Referring Provider:   Flowsheet Row Pulmonary Rehab from 11/18/2022 in [facility] Cardiac and Pulmonary Rehab  Referring Provider [name]       Encounter Date: 11/25/2022  Check In:  Session Check In - 11/25/22 1555       Check-In   Supervising physician immediately available to respond to emergencies See telemetry face sheet for immediately available ER MD    Location [facility]-Cardiac & Pulmonary Rehab    Staff Present Renita Papa, RN BSN;Joseph Tessie Fass, RCP,RRT,BSRT;Noah [place], [place], Exercise Physiologist    Virtual Visit No    Medication changes reported     No    Fall or balance concerns reported    No    Warm-up and Cool-down Performed on first and last piece of equipment    Resistance Training Performed Yes    VAD Patient? No    PAD/SET Patient? No      Pain Assessment   Currently in Pain? No/denies                Social History   Tobacco Use  Smoking Status Former   Packs/day: 0.50   Years: 30.00   Total pack years: 15.00   Types: Cigarettes   Quit date: 03/11/2016   Years since quitting: 6.7  Smokeless Tobacco Never  Tobacco Comments   patient states  that she is working at her own pace to quit smoking    Goals Met:  Independence with exercise equipment Exercise tolerated well No report of concerns or symptoms today Strength training completed today  Goals Unmet:  Not Applicable  Comments: Pt able to follow exercise prescription today without complaint.  Will continue to monitor for progression.    Dr. Emily Filbert is Medical Director for [place].  Dr. Ottie Glazier is Medical Director for [facility] Pulmonary Rehabilitation.

## 2022-11-27 ENCOUNTER — Encounter: Payer: BC Managed Care – PPO | Admitting: *Deleted

## 2022-11-27 DIAGNOSIS — Z5189 Encounter for other specified aftercare: Secondary | ICD-10-CM | POA: Diagnosis not present

## 2022-11-27 DIAGNOSIS — J439 Emphysema, unspecified: Secondary | ICD-10-CM | POA: Diagnosis not present

## 2022-11-27 DIAGNOSIS — J449 Chronic obstructive pulmonary disease, unspecified: Secondary | ICD-10-CM

## 2022-11-27 NOTE — Progress Notes (Signed)
Daily Session Note  Patient Details  Name: Nancy [name] MRN: [id number] Date of Birth: 1961/01/06 Referring Provider:   Flowsheet Row Pulmonary Rehab from 11/18/2022 in [facility] Cardiac and Pulmonary Rehab  Referring Provider [name]       Encounter Date: 11/27/2022  Check In:  Session Check In - 11/27/22 1552       Check-In   Supervising physician immediately available to respond to emergencies See telemetry face sheet for immediately available ER MD    Location [facility]-Cardiac & Pulmonary Rehab    Staff Present Renita Papa, RN BSN;Joseph Tessie Fass, RCP,RRT,BSRT;Megan Tamala Julian, RN, [place]    Virtual Visit No    Medication changes reported     No    Fall or balance concerns reported    No    Warm-up and Cool-down Performed on first and last piece of equipment    Resistance Training Performed Yes    VAD Patient? No    PAD/SET Patient? No      Pain Assessment   Currently in Pain? No/denies                Social History   Tobacco Use  Smoking Status Former   Packs/day: 0.50   Years: 30.00   Total pack years: 15.00   Types: Cigarettes   Quit date: 03/11/2016   Years since quitting: 6.7  Smokeless Tobacco Never  Tobacco Comments   patient states  that she is working at her own pace to quit smoking    Goals Met:  Independence with exercise equipment Exercise tolerated well No report of concerns or symptoms today Strength training completed today  Goals Unmet:  Not Applicable  Comments: Pt able to follow exercise prescription today without complaint.  Will continue to monitor for progression.    Dr. Emily Filbert is Medical Director for [place].  Dr. Ottie Glazier is Medical Director for [facility] Pulmonary Rehabilitation.

## 2022-11-28 ENCOUNTER — Encounter: Payer: BC Managed Care – PPO | Admitting: *Deleted

## 2022-11-28 DIAGNOSIS — J449 Chronic obstructive pulmonary disease, unspecified: Secondary | ICD-10-CM | POA: Diagnosis not present

## 2022-11-28 DIAGNOSIS — Z5189 Encounter for other specified aftercare: Secondary | ICD-10-CM | POA: Diagnosis not present

## 2022-11-28 NOTE — Progress Notes (Signed)
Daily Session Note  Patient Details  Name: Nancy [name] MRN: [id number] Date of Birth: [birth year] Referring Provider:   Flowsheet Row Pulmonary Rehab from 11/18/2022 in [facility] Cardiac and Pulmonary Rehab  Referring Provider [name]       Encounter Date: 11/28/2022  Check In:  Session Check In - 11/28/22 1553       Check-In   Supervising physician immediately available to respond to emergencies See telemetry face sheet for immediately available ER MD    Location [facility]-Cardiac & Pulmonary Rehab    Staff Present Renita Papa, RN BSN;Joseph Tessie Fass, RCP,RRT,BSRT;Noah [place], [place], Exercise Physiologist    Virtual Visit No    Medication changes reported     No    Fall or balance concerns reported    No    Warm-up and Cool-down Performed on first and last piece of equipment    Resistance Training Performed Yes    VAD Patient? No    PAD/SET Patient? No      Pain Assessment   Currently in Pain? No/denies                Social History   Tobacco Use  Smoking Status Former   Packs/day: 0.50   Years: 30.00   Total pack years: 15.00   Types: Cigarettes   Quit date: 03/11/2016   Years since quitting: 6.7  Smokeless Tobacco Never  Tobacco Comments   patient states  that she is working at her own pace to quit smoking    Goals Met:  Independence with exercise equipment Exercise tolerated well No report of concerns or symptoms today Strength training completed today  Goals Unmet:  Not Applicable  Comments: Pt able to follow exercise prescription today without complaint.  Will continue to monitor for progression.    Dr. Emily Filbert is Medical Director for [place].  Dr. Ottie Glazier is Medical Director for [facility] Pulmonary Rehabilitation.

## 2022-12-02 ENCOUNTER — Encounter: Payer: BC Managed Care – PPO | Admitting: *Deleted

## 2022-12-02 DIAGNOSIS — J449 Chronic obstructive pulmonary disease, unspecified: Secondary | ICD-10-CM | POA: Diagnosis not present

## 2022-12-02 DIAGNOSIS — Z5189 Encounter for other specified aftercare: Secondary | ICD-10-CM | POA: Diagnosis not present

## 2022-12-02 NOTE — Progress Notes (Signed)
Daily Session Note  Patient Details  Name: MAXX CALAWAY MRN: [id number] Date of Birth: [birth year] Referring Provider:   Flowsheet Row Pulmonary Rehab from 11/18/2022 in [facility] Cardiac and Pulmonary Rehab  Referring Provider [name]       Encounter Date: 12/02/2022  Check In:  Session Check In - 12/02/22 1615       Check-In   Supervising physician immediately available to respond to emergencies See telemetry face sheet for immediately available ER MD    Location [facility]-Cardiac & Pulmonary Rehab    Staff Present Renita Papa, RN BSN;Joseph Tessie Fass, RCP,RRT,BSRT;Noah [place], [place], Exercise Physiologist    Virtual Visit No    Medication changes reported     No    Fall or balance concerns reported    No    Warm-up and Cool-down Performed on first and last piece of equipment    Resistance Training Performed Yes    VAD Patient? No    PAD/SET Patient? No      Pain Assessment   Currently in Pain? No/denies                Social History   Tobacco Use  Smoking Status Former   Packs/day: 0.50   Years: 30.00   Total pack years: 15.00   Types: Cigarettes   Quit date: 03/11/2016   Years since quitting: 6.7  Smokeless Tobacco Never  Tobacco Comments   patient states  that she is working at her own pace to quit smoking    Goals Met:  Independence with exercise equipment Exercise tolerated well No report of concerns or symptoms today Strength training completed today  Goals Unmet:  Not Applicable  Comments: Pt able to follow exercise prescription today without complaint.  Will continue to monitor for progression.    Dr. Emily Filbert is Medical Director for [place].  Dr. Ottie Glazier is Medical Director for [facility] Pulmonary Rehabilitation.

## 2022-12-03 ENCOUNTER — Encounter: Payer: Self-pay | Admitting: Dermatology

## 2022-12-04 ENCOUNTER — Encounter: Payer: Self-pay | Admitting: *Deleted

## 2022-12-04 ENCOUNTER — Encounter: Payer: BC Managed Care – PPO | Admitting: *Deleted

## 2022-12-04 DIAGNOSIS — J449 Chronic obstructive pulmonary disease, unspecified: Secondary | ICD-10-CM

## 2022-12-04 DIAGNOSIS — Z5189 Encounter for other specified aftercare: Secondary | ICD-10-CM | POA: Diagnosis not present

## 2022-12-04 NOTE — Progress Notes (Signed)
Daily Session Note  Patient Details  Name: LETICIA COLETTA MRN: [id number] Date of Birth: 1960/11/14 Referring Provider:   Flowsheet Row Pulmonary Rehab from 11/18/2022 in [facility]  Referring Provider [name]       Encounter Date: 12/04/2022  Check In:  Session Check In - 12/04/22 1547       Check-In   Supervising physician immediately available to respond to emergencies See telemetry face sheet for immediately available ER MD    Location [facility]    Staff Present Renita Papa, RN BSN;Megan Tamala Julian, RN, Terie Purser, RCP,RRT,BSRT    Virtual Visit No    Medication changes reported     No    Fall or balance concerns reported    No    Warm-up and Cool-down Performed on first and last piece of equipment    Resistance Training Performed Yes    VAD Patient? No    PAD/SET Patient? No      Pain Assessment   Currently in Pain? No/denies                Social History   Tobacco Use  Smoking Status Former   Packs/day: 0.50   Years: 30.00   Total pack years: 15.00   Types: Cigarettes   Quit date: 03/11/2016   Years since quitting: 6.7  Smokeless Tobacco Never  Tobacco Comments   patient states  that she is working at her own pace to quit smoking    Goals Met:  Independence with exercise equipment Exercise tolerated well No report of concerns or symptoms today Strength training completed today  Goals Unmet:  Not Applicable  Comments: Pt able to follow exercise prescription today without complaint.  Will continue to monitor for progression.    Dr. Emily Filbert is Medical Director for [facility].  Dr. Ottie Glazier is Medical Director for [facility].

## 2022-12-04 NOTE — Progress Notes (Signed)
Pulmonary Individual Treatment Plan  Patient Details  Name: Nancy [name] MRN: [id number] Date of Birth: [birth year] Referring Provider:   Flowsheet Row Pulmonary Rehab from 11/18/2022 in [facility] Cardiac and Pulmonary Rehab  Referring Provider Meredeth Ide       Initial Encounter Date:  Flowsheet Row Pulmonary Rehab from 11/18/2022 in [facility] Cardiac and Pulmonary Rehab  Date 11/18/22       Visit Diagnosis: Stage 3 severe COPD by GOLD classification (HCC)  Patient's Home Medications on Admission:  Current Outpatient Medications:    albuterol (VENTOLIN HFA) 108 (90 Base) MCG/ACT inhaler, SMARTSIG:2 Inhalation Via Inhaler Every 6 Hours PRN, Disp: , Rfl:    amLODipine (NORVASC) 5 MG tablet, Take 5 mg by mouth daily., Disp: , Rfl:    aspirin EC 81 MG tablet, Take by mouth., Disp: , Rfl:    budesonide-formoterol (SYMBICORT) 160-4.5 MCG/ACT inhaler, Inhale 2 puffs into the lungs daily. , Disp: , Rfl:    carvedilol (COREG) 25 MG tablet, Take 25 mg by mouth 2 (two) times daily., Disp: , Rfl:    doxycycline (PERIOSTAT) 20 MG tablet, Take 1 tablet (20 mg total) by mouth 2 (two) times daily., Disp: 60 tablet, Rfl: 6   DULoxetine (CYMBALTA) 20 MG capsule, TAKE 2 CAPSULES BY MOUTH DAILY, Disp: 180 capsule, Rfl: 1   Fluticasone-Umeclidin-Vilant 100-62.5-25 MCG/ACT AEPB, SMARTSIG:1 Inhalation Via Inhaler Daily, Disp: , Rfl:    furosemide (LASIX) 40 MG tablet, Take 40 mg by mouth daily., Disp: , Rfl:    levothyroxine (SYNTHROID) 125 MCG tablet, TAKE 1 TABLET BY MOUTH DAILY BEFORE BREAKFAST., Disp: 90 tablet, Rfl: 0   LORazepam (ATIVAN) 0.5 MG tablet, Take 1 tablet (0.5 mg total) by mouth every 8 (eight) hours as needed (nausea, recommend 6am, 2pm, 10pm dosing). (Patient not taking: Reported on 11/07/2022), Disp: 90 tablet, Rfl: 0   ondansetron (ZOFRAN ODT) 4 MG disintegrating tablet, Take 1 tablet (4 mg total) by mouth every 8 (eight) hours as needed for nausea or vomiting. (Patient not taking: Reported on  11/07/2022), Disp: 20 tablet, Rfl: 2   spironolactone (ALDACTONE) 25 MG tablet, Take 25 mg by mouth daily., Disp: , Rfl:    topiramate (TOPAMAX) 50 MG tablet, Take 50 mg by mouth 2 (two) times daily. (Patient not taking: Reported on 12/03/2021), Disp: , Rfl:    valACYclovir (VALTREX) 1000 MG tablet, , Disp: , Rfl:    warfarin (COUMADIN) 2.5 MG tablet, Take 5 mg by mouth daily., Disp: , Rfl:   Current Facility-Administered Medications:    ipratropium-albuterol (DUONEB) 0.5-2.5 (3) MG/3ML nebulizer solution 3 mL, 3 mL, Nebulization, Once, Pollak, Adriana M, PA-C  Past Medical History: Past Medical History:  Diagnosis Date   Aortic dissection (HCC) 2010   COPD (chronic obstructive pulmonary disease) (HCC)    Depression    Hypertension    Hypothyroidism    Pneumonia    PONV (postoperative nausea and vomiting)    Rosacea    Seasonal allergies    Sleep apnea    Does not use CPAP    Tobacco Use: Social History   Tobacco Use  Smoking Status Former   Packs/day: 0.50   Years: 30.00   Total pack years: 15.00   Types: Cigarettes   Quit date: 03/11/2016   Years since quitting: 6.7  Smokeless Tobacco Never  Tobacco Comments   patient states  that she is working at her own pace to quit smoking    Labs: Review Flowsheet       Latest Ref Rng &  Units 07/02/2009 08/29/2015 01/05/2018 09/29/2020  Labs for ITP Cardiac and Pulmonary Rehab  Cholestrol 100 - 199 mg/dL - 161  096  045   LDL (calc) 0 - 99 mg/dL - 409  811  914   HDL-C >39 mg/dL - 53  50  45   Trlycerides 0 - 149 mg/dL - 782  956  213   TCO2 0 - 100 mmol/L 22  - - -     Pulmonary Assessment Scores:  Pulmonary Assessment Scores     Row Name 11/18/22 1739         ADL UCSD   SOB Score total 36     Rest 0     Walk 4     Stairs 4     Bath 3     Dress 1     Shop 3       CAT Score   CAT Score 16       mMRC Score   mMRC Score 2.5              UCSD: Self-administered rating of dyspnea associated with  activities of daily living (ADLs) 6-point scale (0 = "not at all" to 5 = "maximal or unable to do because of breathlessness")  Scoring Scores range from 0 to 120.  Minimally important difference is 5 units  CAT: CAT can identify the health impairment of COPD patients and is better correlated with disease progression.  CAT has a scoring range of zero to 40. The CAT score is classified into four groups of low (less than 10), medium (10 - 20), high (21-30) and very high (31-40) based on the impact level of disease on health status. A CAT score over 10 suggests significant symptoms.  A worsening CAT score could be explained by an exacerbation, poor medication adherence, poor inhaler technique, or progression of COPD or comorbid conditions.  CAT MCID is 2 points  mMRC: mMRC (Modified Medical Research Council) Dyspnea Scale is used to assess the degree of baseline functional disability in patients of respiratory disease due to dyspnea. No minimal important difference is established. A decrease in score of 1 point or greater is considered a positive change.   Pulmonary Function Assessment:  Pulmonary Function Assessment - 11/18/22 1734       Initial Spirometry Results   FVC% 72 %    FEV1% 34 %    FEV1/FVC Ratio 38      Post Bronchodilator Spirometry Results   FVC% 79 %    FEV1% 37 %    FEV1/FVC Ratio 38             Exercise Target Goals: Exercise Program Goal: Individual exercise prescription set using results from initial 6 min walk test and THRR while considering  patient's activity barriers and safety.   Exercise Prescription Goal: Initial exercise prescription builds to 30-45 minutes a day of aerobic activity, 2-3 days per week.  Home exercise guidelines will be given to patient during program as part of exercise prescription that the participant will acknowledge.  Education: Aerobic Exercise: - Group verbal and visual presentation on the components of exercise prescription.  Introduces F.I.T.T principle from ACSM for exercise prescriptions.  Reviews F.I.T.T. principles of aerobic exercise including progression. Written material given at graduation. Flowsheet Row Pulmonary Rehab from 11/27/2022 in [facility] Cardiac and Pulmonary Rehab  Date 11/27/22  Educator KW  Instruction Review Code 1- [employer] Understanding       Education: Resistance Exercise: - Group verbal and  visual presentation on the components of exercise prescription. Introduces F.I.T.T principle from ACSM for exercise prescriptions  Reviews F.I.T.T. principles of resistance exercise including progression. Written material given at graduation.    Education: Exercise & Equipment Safety: - Individual verbal instruction and demonstration of equipment use and safety with use of the equipment. Flowsheet Row Pulmonary Rehab from 11/27/2022 in [facility] Cardiac and Pulmonary Rehab  Date 11/18/22  Educator [facility]  Instruction Review Code 1- Verbalizes Understanding       Education: Exercise Physiology & General Exercise Guidelines: - Group verbal and written instruction with models to review the exercise physiology of the cardiovascular system and associated critical values. Provides general exercise guidelines with specific guidelines to those with heart or lung disease.    Education: Flexibility, Balance, Mind/Body Relaxation: - Group verbal and visual presentation with interactive activity on the components of exercise prescription. Introduces F.I.T.T principle from ACSM for exercise prescriptions. Reviews F.I.T.T. principles of flexibility and balance exercise training including progression. Also discusses the mind body connection.  Reviews various relaxation techniques to help reduce and manage stress (i.e. Deep breathing, progressive muscle relaxation, and visualization). Balance handout provided to take home. Written material given at graduation.   Activity Barriers & Risk Stratification:  Activity Barriers  & Cardiac Risk Stratification - 11/18/22 1716       Activity Barriers & Cardiac Risk Stratification   Activity Barriers None             6 Minute Walk:  6 Minute Walk     Row Name 11/18/22 1708         6 Minute Walk   Phase Initial     Distance 580 feet     Walk Time 4.5 minutes     # of Rest Breaks 4     MPH 1.46     METS 2.3     RPE 14     Perceived Dyspnea  3     VO2 Peak 8.04     Symptoms Yes (comment)  SOB     Resting HR 89 bpm     Resting BP 138/84     Resting Oxygen Saturation  93 %     Exercise Oxygen Saturation  during 6 min walk 94 %     Max Ex. HR 104 bpm     Max Ex. BP 146/72     2 Minute Post BP 122/60       Interval HR   1 Minute HR 99     2 Minute HR 104     3 Minute HR 95     4 Minute HR 95     5 Minute HR 95     6 Minute HR 98     2 Minute Post HR 98     Interval Heart Rate? Yes       Interval Oxygen   Interval Oxygen? Yes     Baseline Oxygen Saturation % 93 %     1 Minute Oxygen Saturation % 99 %     1 Minute Liters of Oxygen 2 L     2 Minute Oxygen Saturation % 104 %     2 Minute Liters of Oxygen 2 L     3 Minute Oxygen Saturation % 95 %     3 Minute Liters of Oxygen 2 L     4 Minute Oxygen Saturation % 95 %     4 Minute Liters of Oxygen 2 L     5  Minute Oxygen Saturation % 95 %     5 Minute Liters of Oxygen 2 L     6 Minute Oxygen Saturation % 98 %     6 Minute Liters of Oxygen 2 L     2 Minute Post Oxygen Saturation % 98 %     2 Minute Post Liters of Oxygen 2 L             Oxygen Initial Assessment:  Oxygen Initial Assessment - 11/18/22 1732       Home Oxygen   Home Oxygen Device Portable Concentrator    Sleep Oxygen Prescription Continuous    Home Exercise Oxygen Prescription --    Liters per minute 2    Home Resting Oxygen Prescription --    Liters per minute 2    Compliance with Home Oxygen Use No      Initial 6 min Walk   Oxygen Used Continuous    Liters per minute 2      Program Oxygen Prescription    Program Oxygen Prescription Continuous    Liters per minute 2      Intervention   Short Term Goals To learn and understand importance of monitoring SPO2 with pulse oximeter and demonstrate accurate use of the pulse oximeter.;To learn and understand importance of maintaining oxygen saturations>88%;To learn and demonstrate proper pursed lip breathing techniques or other breathing techniques. ;To learn and demonstrate proper use of respiratory medications    Long  Term Goals Verbalizes importance of monitoring SPO2 with pulse oximeter and return demonstration;Maintenance of O2 saturations>88%;Exhibits proper breathing techniques, such as pursed lip breathing or other method taught during program session;Compliance with respiratory medication             Oxygen Re-Evaluation:  Oxygen Re-Evaluation     Row Name 11/21/22 1608             Goals/Expected Outcomes   Short Term Goals To learn and understand importance of monitoring SPO2 with pulse oximeter and demonstrate accurate use of the pulse oximeter.;To learn and understand importance of maintaining oxygen saturations>88%;To learn and demonstrate proper pursed lip breathing techniques or other breathing techniques. ;To learn and demonstrate proper use of respiratory medications       Long  Term Goals Verbalizes importance of monitoring SPO2 with pulse oximeter and return demonstration;Maintenance of O2 saturations>88%;Exhibits proper breathing techniques, such as pursed lip breathing or other method taught during program session;Compliance with respiratory medication       Comments Reviewed PLB technique with pt.  Talked about how it works and it's importance in maintaining their exercise saturations.       Goals/Expected Outcomes Short: Become more profiecient at using PLB.   Long: Become independent at using PLB.                Oxygen Discharge (Final Oxygen Re-Evaluation):  Oxygen Re-Evaluation - 11/21/22 1608        Goals/Expected Outcomes   Short Term Goals To learn and understand importance of monitoring SPO2 with pulse oximeter and demonstrate accurate use of the pulse oximeter.;To learn and understand importance of maintaining oxygen saturations>88%;To learn and demonstrate proper pursed lip breathing techniques or other breathing techniques. ;To learn and demonstrate proper use of respiratory medications    Long  Term Goals Verbalizes importance of monitoring SPO2 with pulse oximeter and return demonstration;Maintenance of O2 saturations>88%;Exhibits proper breathing techniques, such as pursed lip breathing or other method taught during program session;Compliance with respiratory medication  Comments Reviewed PLB technique with pt.  Talked about how it works and it's importance in maintaining their exercise saturations.    Goals/Expected Outcomes Short: Become more profiecient at using PLB.   Long: Become independent at using PLB.             Initial Exercise Prescription:  Initial Exercise Prescription - 11/18/22 1700       Date of Initial Exercise RX and Referring Provider   Date 11/18/22    Referring Provider Meredeth Ide      Oxygen   Oxygen Continuous    Liters 2    Maintain Oxygen Saturation 88% or higher      Treadmill   MPH 0.8    Grade 0    Minutes 15    METs 1      Recumbant Bike   Level 1    RPM 50    Minutes 15    METs 1.46      NuStep   Level 1    SPM 80    Minutes 15    METs 1.46      REL-XR   Level 1    Speed 50    Minutes 15    METs 1.46      T5 Nustep   Level 1    SPM 80    Minutes 15    METs 2.3      Track   Laps 10    Minutes 15    METs 1.54      Prescription Details   Frequency (times per week) 3    Duration Progress to 30 minutes of continuous aerobic without signs/symptoms of physical distress      Intensity   THRR 40-80% of Max Heartrate 117-146    Perceived Dyspnea 0-4      Progression   Progression Continue to progress workloads to  maintain intensity without signs/symptoms of physical distress.      Resistance Training   Training Prescription Yes    Weight 2    Reps 10-15             Perform Capillary Blood Glucose checks as needed.  Exercise Prescription Changes:   Exercise Prescription Changes     Row Name 11/18/22 1700 11/25/22 1100           Response to Exercise   Blood Pressure (Admit) 138/84 116/58      Blood Pressure (Exercise) 146/72 128/64      Blood Pressure (Exit) 122/60 110/58      Heart Rate (Admit) 89 bpm 89 bpm      Heart Rate (Exercise) 104 bpm 98 bpm      Heart Rate (Exit) 98 bpm 85 bpm      Oxygen Saturation (Admit) 93 % 95 %      Oxygen Saturation (Exercise) 94 % 89 %      Oxygen Saturation (Exit) 98 % 94 %      Rating of Perceived Exertion (Exercise) 14 13      Perceived Dyspnea (Exercise) 3 3      Symptoms SOB SOB      Comments 6 MWT results 1st full day of exercise      Duration -- Progress to 30 minutes of  aerobic without signs/symptoms of physical distress      Intensity -- THRR unchanged        Progression   Progression -- Continue to progress workloads to maintain intensity without signs/symptoms of physical distress.  Average METs -- 1.69        Resistance Training   Training Prescription -- Yes      Weight -- 2 lb      Reps -- 10-15        Interval Training   Interval Training -- No        Oxygen   Oxygen -- Continuous      Liters -- 2        Treadmill   MPH -- 0.9      Grade -- 0      Minutes -- 15      METs -- 1.7        NuStep   Level -- 2      Minutes -- 15      METs -- 1.7               Exercise Comments:   Exercise Comments     Row Name 11/21/22 1606           Exercise Comments First full day of exercise!  Patient was oriented to gym and equipment including functions, settings, policies, and procedures.  Patient's individual exercise prescription and treatment plan were reviewed.  All starting workloads were established  based on the results of the 6 minute walk test done at initial orientation visit.  The plan for exercise progression was also introduced and progression will be customized based on patient's performance and goals.                Exercise Goals and Review:   Exercise Goals     Row Name 11/18/22 1722             Exercise Goals   Increase Physical Activity Yes       Intervention Provide advice, education, support and counseling about physical activity/exercise needs.;Develop an individualized exercise prescription for aerobic and resistive training based on initial evaluation findings, risk stratification, comorbidities and participant's personal goals.       Expected Outcomes Short Term: Attend rehab on a regular basis to increase amount of physical activity.;Long Term: Add in home exercise to make exercise part of routine and to increase amount of physical activity.;Long Term: Exercising regularly at least 3-5 days a week.       Increase Strength and Stamina Yes       Intervention Provide advice, education, support and counseling about physical activity/exercise needs.;Develop an individualized exercise prescription for aerobic and resistive training based on initial evaluation findings, risk stratification, comorbidities and participant's personal goals.       Expected Outcomes Short Term: Increase workloads from initial exercise prescription for resistance, speed, and METs.;Short Term: Perform resistance training exercises routinely during rehab and add in resistance training at home;Long Term: Improve cardiorespiratory fitness, muscular endurance and strength as measured by increased METs and functional capacity ( )       Able to understand and use rate of perceived exertion (RPE) scale Yes       Intervention Provide education and explanation on how to use RPE scale       Expected Outcomes Short Term: Able to use RPE daily in rehab to express subjective intensity level;Long Term:   Able to use RPE to guide intensity level when exercising independently       Able to understand and use Dyspnea scale Yes       Intervention Provide education and explanation on how to use Dyspnea scale       Expected Outcomes Short  Term: Able to use Dyspnea scale daily in rehab to express subjective sense of shortness of breath during exertion;Long Term: Able to use Dyspnea scale to guide intensity level when exercising independently       Knowledge and understanding of Target Heart Rate Range (THRR) Yes       Intervention Provide education and explanation of THRR including how the numbers were predicted and where they are located for reference       Expected Outcomes Short Term: Able to state/look up THRR;Long Term: Able to use THRR to govern intensity when exercising independently;Short Term: Able to use daily as guideline for intensity in rehab       Able to check pulse independently Yes       Intervention Provide education and demonstration on how to check pulse in carotid and radial arteries.;Review the importance of being able to check your own pulse for safety during independent exercise       Expected Outcomes Short Term: Able to explain why pulse checking is important during independent exercise       Understanding of Exercise Prescription Yes       Intervention Provide education, explanation, and written materials on patient's individual exercise prescription       Expected Outcomes Short Term: Able to explain program exercise prescription;Long Term: Able to explain home exercise prescription to exercise independently                Exercise Goals Re-Evaluation :  Exercise Goals Re-Evaluation     Row Name 11/21/22 1607 11/25/22 1123           Exercise Goal Re-Evaluation   Exercise Goals Review Increase Physical Activity;Able to understand and use rate of perceived exertion (RPE) scale;Knowledge and understanding of Target Heart Rate Range (THRR);Understanding of Exercise  Prescription;Increase Strength and Stamina;Able to check pulse independently;Able to understand and use Dyspnea scale Increase Physical Activity;Increase Strength and Stamina;Understanding of Exercise Prescription      Comments Reviewed RPE scale, THR and program prescription with pt today.  Pt voiced understanding and was given a copy of goals to take home. Aunya is off to a good start with rehab for her first session. She was able to work at her initial exercise prescription and even went up to level 2 on the T4 Nustep.  RPEs were appropriate. We will continue to monitor as she progresses in the program.      Expected Outcomes Short: Use RPE daily to regulate intensity.  Long: Follow program prescription in THR. Short: Continue to follow exercise prescription Long: Progressively increase overall MET level and stamina               Discharge Exercise Prescription (Final Exercise Prescription Changes):  Exercise Prescription Changes - 11/25/22 1100       Response to Exercise   Blood Pressure (Admit) 116/58    Blood Pressure (Exercise) 128/64    Blood Pressure (Exit) 110/58    Heart Rate (Admit) 89 bpm    Heart Rate (Exercise) 98 bpm    Heart Rate (Exit) 85 bpm    Oxygen Saturation (Admit) 95 %    Oxygen Saturation (Exercise) 89 %    Oxygen Saturation (Exit) 94 %    Rating of Perceived Exertion (Exercise) 13    Perceived Dyspnea (Exercise) 3    Symptoms SOB    Comments 1st full day of exercise    Duration Progress to 30 minutes of  aerobic without signs/symptoms of physical distress  Intensity THRR unchanged      Progression   Progression Continue to progress workloads to maintain intensity without signs/symptoms of physical distress.    Average METs 1.69      Resistance Training   Training Prescription Yes    Weight 2 lb    Reps 10-15      Interval Training   Interval Training No      Oxygen   Oxygen Continuous    Liters 2      Treadmill   MPH 0.9    Grade 0     Minutes 15    METs 1.7      NuStep   Level 2    Minutes 15    METs 1.7             Nutrition:  Target Goals: Understanding of nutrition guidelines, daily intake of sodium 1500mg , cholesterol 200mg , calories 30% from fat and 7% or less from saturated fats, daily to have 5 or more servings of fruits and vegetables.  Education: All About Nutrition: -Group instruction provided by verbal, written material, interactive activities, discussions, models, and posters to present general guidelines for heart healthy nutrition including fat, fiber, MyPlate, the role of sodium in heart healthy nutrition, utilization of the nutrition label, and utilization of this knowledge for meal planning. Follow up email sent as well. Written material given at graduation. Flowsheet Row Pulmonary Rehab from 11/27/2022 in [facility] Cardiac and Pulmonary Rehab  Education need identified 11/18/22       Biometrics:  Pre Biometrics - 11/18/22 1722       Pre Biometrics   Height 5' 7.5" (1.715 m)    Weight 190 lb 4.8 oz (86.3 kg)    Waist Circumference 40 inches    Hip Circumference 44 inches    Waist to Hip Ratio 0.91 %    BMI (Calculated) 29.35    Single Leg Stand 2.66 seconds              Nutrition Therapy Plan and Nutrition Goals:  Nutrition Therapy & Goals - 11/18/22 1723       Intervention Plan   Intervention Prescribe, educate and counsel regarding individualized specific dietary modifications aiming towards targeted core components such as weight, hypertension, lipid management, diabetes, heart failure and other comorbidities.    Expected Outcomes Short Term Goal: Understand basic principles of dietary content, such as calories, fat, sodium, cholesterol and nutrients.;Short Term Goal: A plan has been developed with personal nutrition goals set during dietitian appointment.;Long Term Goal: Adherence to prescribed nutrition plan.             Nutrition Assessments:  MEDIFICTS Score  Key: ?70 Need to make dietary changes  40-70 Heart Healthy Diet ? 40 Therapeutic Level Cholesterol Diet  Flowsheet Row Pulmonary Rehab from 11/18/2022 in [facility] Cardiac and Pulmonary Rehab  Picture Your Plate Total Score on Admission 41      Picture Your Plate Scores: <50 Unhealthy dietary pattern with much room for improvement. 41-50 Dietary pattern unlikely to meet recommendations for good health and room for improvement. 51-60 More healthful dietary pattern, with some room for improvement.  >60 Healthy dietary pattern, although there may be some specific behaviors that could be improved.   Nutrition Goals Re-Evaluation:  Nutrition Goals Re-Evaluation     Jacksons' Gap Name 11/28/22 1604             Goals   Current Weight 189 lb (85.7 kg)       Nutrition Goal Manage  Portion sizes       Comment Patient was informed on why it is important to maintain a balanced diet when dealing with Respiratory issues. Explained that it takes a lot of energy to breath and when they are short of breath often they will need to have a good diet to help keep up with the calories they are expending for breathing.       Expected Outcome Short: Choose and plan snacks accordingly to patients caloric intake to improve breathing. Long: Maintain a diet independently that meets their caloric intake to aid in daily shortness of breath.                Nutrition Goals Discharge (Final Nutrition Goals Re-Evaluation):  Nutrition Goals Re-Evaluation - 11/28/22 1604       Goals   Current Weight 189 lb (85.7 kg)    Nutrition Goal Manage Portion sizes    Comment Patient was informed on why it is important to maintain a balanced diet when dealing with Respiratory issues. Explained that it takes a lot of energy to breath and when they are short of breath often they will need to have a good diet to help keep up with the calories they are expending for breathing.    Expected Outcome Short: Choose and plan snacks accordingly  to patients caloric intake to improve breathing. Long: Maintain a diet independently that meets their caloric intake to aid in daily shortness of breath.             Psychosocial: Target Goals: Acknowledge presence or absence of significant depression and/or stress, maximize coping skills, provide positive support system. Participant is able to verbalize types and ability to use techniques and skills needed for reducing stress and depression.   Education: Stress, Anxiety, and Depression - Group verbal and visual presentation to define topics covered.  Reviews how body is impacted by stress, anxiety, and depression.  Also discusses healthy ways to reduce stress and to treat/manage anxiety and depression.  Written material given at graduation.   Education: Sleep Hygiene -Provides group verbal and written instruction about how sleep can affect your health.  Define sleep hygiene, discuss sleep cycles and impact of sleep habits. Review good sleep hygiene tips.    Initial Review & Psychosocial Screening:  Initial Psych Review & Screening - 11/07/22 1437       Initial Review   Current issues with Current Depression      Family Dynamics   Good Support System? Yes   sister, family     Screening Interventions   Interventions Encouraged to exercise;Provide feedback about the scores to participant    Expected Outcomes Short Term goal: Utilizing psychosocial counselor, staff and physician to assist with identification of specific Stressors or current issues interfering with healing process. Setting desired goal for each stressor or current issue identified.;Long Term Goal: Stressors or current issues are controlled or eliminated.;Short Term goal: Identification and review with participant of any Quality of Life or Depression concerns found by scoring the questionnaire.;Long Term goal: The participant improves quality of Life and PHQ9 Scores as seen by post scores and/or verbalization of changes              Quality of Life Scores:  Scores of 19 and below usually indicate a poorer quality of life in these areas.  A difference of  2-3 points is a clinically meaningful difference.  A difference of 2-3 points in the total score of the Quality of Life Index  has been associated with significant improvement in overall quality of life, self-image, physical symptoms, and general health in studies assessing change in quality of life.  PHQ-9: Review Flowsheet  More data may exist      11/18/2022 09/21/2021 09/18/2020 12/13/2019 01/02/2018  Depression screen PHQ 2/9  Decreased Interest 1 0 0 0 1  Down, Depressed, Hopeless 0 0 0 0 0  PHQ - 2 Score 1 0 0 0 1  Altered sleeping 0 2 - 0 -  Tired, decreased energy 3 3 - 0 -  Change in appetite 0 0 - 0 -  Feeling bad or failure about yourself  0 0 - 0 -  Trouble concentrating 0 0 - 0 -  Moving slowly or fidgety/restless 0 0 - 0 -  Suicidal thoughts 0 0 - 0 -  PHQ-9 Score 4 5 - 0 -  Difficult doing work/chores - Not difficult at all - Not difficult at all -   Interpretation of Total Score  Total Score Depression Severity:  1-4 = Minimal depression, 5-9 = Mild depression, 10-14 = Moderate depression, 15-19 = Moderately severe depression, 20-27 = Severe depression   Psychosocial Evaluation and Intervention:  Psychosocial Evaluation - 11/07/22 1442       Psychosocial Evaluation & Interventions   Interventions Encouraged to exercise with the program and follow exercise prescription    Comments Mikael is coming to pulmonary rehab for COPD. She is on cymbalta and reports it is working well. 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 She recently got a concentrator but it was unclear over the phone as to her oxygen use. She reports feeling like she is managing her health well independenlty and also has a great support system of family close by.    Expected Outcomes Short: attend pulmonary rehab for education and exercise.  Long: develop and maintain positive self care habits.    Continue Psychosocial Services  Follow up required by staff             Psychosocial Re-Evaluation:  Psychosocial Re-Evaluation     Row Name 11/28/22 1605             Psychosocial Re-Evaluation   Current issues with Current Psychotropic Meds;Current Depression;Current Anxiety/Panic       Comments Her depression stems from her health. Her balance was off and when she was sitting she would feel like the room was spinning. She would feel anxious last January when all of this started. She has been okay in the last couple of weeks and doing well. She has more procedures coming up and is going to stay on Cymbalta to help her mood. 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       Expected Outcomes Short: continue medication for mood. Long: maintain medciation to keep mood stable.       Interventions Encouraged to attend Pulmonary Rehabilitation for the exercise       Continue Psychosocial Services  Follow up required by staff                Psychosocial Discharge (Final Psychosocial Re-Evaluation):  Psychosocial Re-Evaluation - 11/28/22 1605       Psychosocial Re-Evaluation   Current issues with Current Psychotropic Meds;Current Depression;Current Anxiety/Panic    Comments Her depression stems from her health. Her balance was off and when she was sitting she would feel like the room was spinning. She would feel anxious last January when  all of this started. She has been okay in the last couple of weeks and doing well. She has more procedures coming up and is going to stay on Cymbalta to help her mood. 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    Expected Outcomes Short: continue medication for mood. Long: maintain medciation to keep mood stable.    Interventions Encouraged to attend Pulmonary Rehabilitation for the exercise    Continue Psychosocial Services  Follow up required by staff              Education: Education Goals: Education classes will be provided on a weekly basis, covering required topics. Participant will state understanding/return demonstration of topics presented.  Learning Barriers/Preferences:  Learning Barriers/Preferences - 11/07/22 1453       Learning Barriers/Preferences   Learning Barriers None    Learning Preferences Individual Instruction             General Pulmonary Education Topics:  Infection Prevention: - Provides verbal and written material to individual with discussion of infection control including proper hand washing and proper equipment cleaning during exercise session. Flow[facility]  Date 11/18/22  [facility]ator [facility]  Instruction Review Code 1- Verbalizes Understanding       Falls Prevention: - Provides verbal and written material to individual with discussion of falls prevention and safety. Flowsheet [facility]  Date 11/18/22  Educator [facility]  Instruction Review Code 1- Verbalizes Understanding       Chronic Lung Disease Review: - Group verbal instruction with posters, models, PowerPoint presentations and videos,  to review new updates, new respiratory medications, new advancements in procedures and treatments. Providing information on websites and "800" numbers for continued self-education. Includes information about supplement oxygen, available portable oxygen systems, continuous and intermittent flow rates, oxygen safety, concentrators, and Medicare reimbursement for oxygen. Explanation of Pulmonary Drugs, including class, frequency, complications, importance of spacers, rinsing mouth after steroid MDI's, and proper cleaning methods for nebulizers. Review of basic lung anatomy and physiology related to function, structure, and complications of lung disease. Review of risk factors. Discussion about  methods for diagnosing sleep apnea and types of masks and machines for OSA. Includes a review of the use of types of environmental controls: home humidity, furnaces, filters, dust mite/pet prevention, HEPA vacuums. Discussion about weather changes, air quality and the benefits of nasal washing. Instruction on Warning signs, infection symptoms, calling MD promptly, preventive modes, and value of vaccinations. Review of effective airway clearance, coughing and/or vibration techniques. Emphasizing that all should Create an Action Plan. Written material given at graduation. Flowsheet Row [facility]  Education need identified 11/18/22       AED/CPR: - Group verbal and written instruction with the use of models to demonstrate the basic use of the AED with the basic ABC's of resuscitation.    Anatomy and Cardiac Procedures: - Group verbal and visual presentation and models provide information about basic cardiac anatomy and function. Reviews the testing methods done to diagnose heart disease and the outcomes of the test results. Describes the treatment choices: Medical Management, Angioplasty, or Coronary Bypass Surgery for treating various heart conditions including Myocardial Infarction, Angina, Valve Disease, and Cardiac Arrhythmias.  Written material given at graduation.   Medication Safety: - Group verbal and visual instruction to review commonly prescribed medications for heart and  lung disease. Reviews the medication, class of the drug, and side effects. Includes the steps to properly store meds and maintain the prescription regimen.  Written material given at graduation.   Other: -Provides group and verbal instruction on various topics (see comments)   Knowledge Questionnaire Score:  Knowledge Questionnaire Score - 11/18/22 1729       Knowledge Questionnaire Score   Pre Score 16/18              Core Components/Risk  Factors/Patient Goals at Admission:  Personal Goals and Risk Factors at Admission - 11/18/22 1723       Core Components/Risk Factors/Patient Goals on Admission    Weight Management Yes    Intervention Weight Management: Develop a combined nutrition and exercise program designed to reach desired caloric intake, while maintaining appropriate intake of nutrient and fiber, sodium and fats, and appropriate energy expenditure required for the weight goal.;Weight Management: Provide education and appropriate resources to help participant work on and attain dietary goals.;Weight Management/Obesity: Establish reasonable short term and long term weight goals.    Admit Weight 190 lb 4.8 oz (86.3 kg)    Goal Weight: Short Term 185 lb (83.9 kg)    Goal Weight: Long Term 180 lb (81.6 kg)    Expected Outcomes Short Term: Continue to assess and modify interventions until short term weight is achieved;Long Term: Adherence to nutrition and physical activity/exercise program aimed toward attainment of established weight goal;Weight Loss: Understanding of general recommendations for a balanced deficit meal plan, which promotes 1-2 lb weight loss per week and includes a negative energy balance of [phone] kcal/d;Understanding recommendations for meals to include 15-35% energy as protein, 25-35% energy from fat, 35-60% energy from carbohydrates, less than 200mg  of dietary cholesterol, 20-35 gm of total fiber daily;Understanding of distribution of calorie intake throughout the day with the consumption of 4-5 meals/snacks    Hypertension Yes    Intervention Provide education on lifestyle modifcations including regular physical activity/exercise, weight management, moderate sodium restriction and increased consumption of fresh fruit, vegetables, and low fat dairy, alcohol moderation, and smoking cessation.;Monitor prescription use compliance.    Expected Outcomes Short Term: Continued assessment and intervention until BP is <  140/53mm HG in hypertensive participants. < 130/12mm HG in hypertensive participants with diabetes, heart failure or chronic kidney disease.;Long Term: Maintenance of blood pressure at goal levels.             Education:Diabetes - Individual verbal and written instruction to review signs/symptoms of diabetes, desired ranges of glucose level fasting, after meals and with exercise. Acknowledge that pre and post exercise glucose checks will be done for 3 sessions at entry of program.   Know Your Numbers and Heart Failure: - Group verbal and visual instruction to discuss disease risk factors for cardiac and pulmonary disease and treatment options.  Reviews associated critical values for Overweight/Obesity, Hypertension, Cholesterol, and Diabetes.  Discusses basics of heart failure: signs/symptoms and treatments.  Introduces Heart Failure Zone chart for action plan for heart failure.  Written material given at graduation.   Core Components/Risk Factors/Patient Goals Review:   Goals and Risk Factor Review     Row Name 11/28/22 1603             Core Components/Risk Factors/Patient Goals Review   Personal Goals Review Improve shortness of breath with ADL's       Review Spoke to patient about their shortness of breath and what they can do to improve. Patient has been informed of  breathing techniques when starting the program. Patient is informed to tell staff if they have had any med changes and that certain meds they are taking or not taking can be causing shortness of breath.       Expected Outcomes Short: Attend [facility] regularly to improve shortness of breath with ADL's. Long: maintain independence with ADL's                Core Components/Risk Factors/Patient Goals at Discharge (Final Review):   Goals and Risk Factor Review - 11/28/22 1603       Core Components/Risk Factors/Patient Goals Review   Personal Goals Review Improve shortness of breath with ADL's    Review Spoke to  patient about their shortness of breath and what they can do to improve. Patient has been informed of breathing techniques when starting the program. Patient is informed to tell staff if they have had any med changes and that certain meds they are taking or not taking can be causing shortness of breath.    Expected Outcomes Short: Attend [facility] regularly to improve shortness of breath with ADL's. Long: maintain independence with ADL's             ITP Comments:  ITP Comments     Row Name 10/28/22 1035 11/07/22 1442 11/18/22 1708 11/21/22 1606 12/04/22 1052   ITP Comments letter to Dr Zebedee IbaGAca for any exercise parameters before the thoracic dissesction i repiared in Feb 2024. Initial phone call completed. [facility] EP Orientation scheduled for Monday 1/8 at 3pm. Completed 6MWT and gym orientation. Initial ITP created and sent for review to Dr. Jinny SandersFaud Aleskerov, Medical Director. First full day of exercise!  Patient was oriented to gym and equipment including functions, settings, policies, and procedures.  Patient's individual exercise prescription and treatment plan were reviewed.  All starting workloads were established based on the results of the 6 minute walk test done at initial orientation visit.  The plan for exercise progression was also introduced and progression will be customized based on patient's performance and goals. 30 Day review completed. Medical Director ITP review done, changes made as directed, and signed approval by Medical Director.   new to propgram            Comments:

## 2022-12-05 ENCOUNTER — Encounter: Payer: BC Managed Care – PPO | Admitting: *Deleted

## 2022-12-05 ENCOUNTER — Other Ambulatory Visit: Payer: Self-pay | Admitting: Family Medicine

## 2022-12-05 DIAGNOSIS — Z5189 Encounter for other specified aftercare: Secondary | ICD-10-CM | POA: Diagnosis not present

## 2022-12-05 DIAGNOSIS — F339 Major depressive disorder, recurrent, unspecified: Secondary | ICD-10-CM

## 2022-12-05 DIAGNOSIS — J449 Chronic obstructive pulmonary disease, unspecified: Secondary | ICD-10-CM

## 2022-12-05 NOTE — Progress Notes (Signed)
Daily Session Note  Patient Details  Name: Nancy [name] MRN: [id number] Date of Birth: 1961-03-24 Referring Provider:   Flowsheet Row Pulmonary Rehab from 11/18/2022 in [facility] Cardiac and Pulmonary Rehab  Referring Provider [name]       Encounter Date: 12/05/2022  Check In:  Session Check In - 12/05/22 [place]       Check-In   Supervising physician immediately available to respond to emergencies See telemetry face sheet for immediately available ER MD    Location [facility]-Cardiac & Pulmonary Rehab    Staff Present Renita Papa, RN BSN;Joseph Tessie Fass, RCP,RRT,BSRT;Noah Wade, [place], Exercise Physiologist    Virtual Visit No    Medication changes reported     No    Fall or balance concerns reported    No    Warm-up and Cool-down Performed on first and last piece of equipment    Resistance Training Performed Yes    VAD Patient? No    PAD/SET Patient? No      Pain Assessment   Currently in Pain? No/denies                Social History   Tobacco Use  Smoking Status Former   Packs/day: 0.50   Years: 30.00   Total pack years: 15.00   Types: Cigarettes   Quit date: 03/11/2016   Years since quitting: 6.7  Smokeless Tobacco Never  Tobacco Comments   patient states  that she is working at her own pace to quit smoking    Goals Met:  Independence with exercise equipment Exercise tolerated well No report of concerns or symptoms today Strength training completed today  Goals Unmet:  Not Applicable  Comments: Pt able to follow exercise prescription today without complaint.  Will continue to monitor for progression.    Dr. Emily Filbert is Medical Director for [place].  Dr. Ottie Glazier is Medical Director for [facility] Pulmonary Rehabilitation.

## 2022-12-09 ENCOUNTER — Encounter: Payer: BC Managed Care – PPO | Admitting: *Deleted

## 2022-12-09 DIAGNOSIS — J449 Chronic obstructive pulmonary disease, unspecified: Secondary | ICD-10-CM | POA: Diagnosis not present

## 2022-12-09 DIAGNOSIS — Z5189 Encounter for other specified aftercare: Secondary | ICD-10-CM | POA: Diagnosis not present

## 2022-12-09 NOTE — Progress Notes (Signed)
Daily Session Note  Patient Details  Name: Nancy [name] MRN: [id number] Date of Birth: November 14, 1960 Referring Provider:   Flowsheet Row Pulmonary Rehab from 11/18/2022 in [facility] Cardiac and Pulmonary Rehab  Referring Provider [name]       Encounter Date: 12/09/2022  Check In:  Session Check In - 12/09/22 1604       Check-In   Supervising physician immediately available to respond to emergencies See telemetry face sheet for immediately available ER MD    Location [facility]-Cardiac & Pulmonary Rehab    Staff Present Renita Papa, RN BSN;Joseph Tessie Fass, RCP,RRT,BSRT;Noah Wittmann, [place], Exercise Physiologist    Virtual Visit No    Medication changes reported     No    Fall or balance concerns reported    No    Warm-up and Cool-down Performed on first and last piece of equipment    Resistance Training Performed Yes    VAD Patient? No    PAD/SET Patient? No      Pain Assessment   Currently in Pain? No/denies                Social History   Tobacco Use  Smoking Status Former   Packs/day: 0.50   Years: 30.00   Total pack years: 15.00   Types: Cigarettes   Quit date: 03/11/2016   Years since quitting: 6.7  Smokeless Tobacco Never  Tobacco Comments   patient states  that she is working at her own pace to quit smoking    Goals Met:  Independence with exercise equipment Exercise tolerated well No report of concerns or symptoms today Strength training completed today  Goals Unmet:  Not Applicable  Comments: Pt able to follow exercise prescription today without complaint.  Will continue to monitor for progression.    Dr. Emily Filbert is Medical Director for [place].  Dr. Ottie Glazier is Medical Director for [facility] Pulmonary Rehabilitation.

## 2022-12-11 ENCOUNTER — Encounter: Payer: BC Managed Care – PPO | Admitting: *Deleted

## 2022-12-11 DIAGNOSIS — J449 Chronic obstructive pulmonary disease, unspecified: Secondary | ICD-10-CM

## 2022-12-11 DIAGNOSIS — Z5189 Encounter for other specified aftercare: Secondary | ICD-10-CM | POA: Diagnosis not present

## 2022-12-11 NOTE — Progress Notes (Signed)
Daily Session Note  Patient Details  Name: Nancy [name] MRN: [id number] Date of Birth: Dec 08, 1960 Referring Provider:   Flowsheet Row Pulmonary Rehab from 11/18/2022 in [facility] Cardiac and Pulmonary Rehab  Referring Provider [name]       Encounter Date: 12/11/2022  Check In:  Session Check In - 12/11/22 1535       Check-In   Supervising physician immediately available to respond to emergencies See telemetry face sheet for immediately available ER MD    Location [facility]-Cardiac & Pulmonary Rehab    Staff Present Renita Papa, RN BSN;Jessica Luan Pulling, MA, RCEP, CCRP, Mindi Curling, RN, [place]    Virtual Visit No    Medication changes reported     No    Fall or balance concerns reported    No    Warm-up and Cool-down Performed on first and last piece of equipment    Resistance Training Performed Yes    VAD Patient? No    PAD/SET Patient? No      Pain Assessment   Currently in Pain? No/denies                Social History   Tobacco Use  Smoking Status Former   Packs/day: 0.50   Years: 30.00   Total pack years: 15.00   Types: Cigarettes   Quit date: 03/11/2016   Years since quitting: 6.7  Smokeless Tobacco Never  Tobacco Comments   patient states  that she is working at her own pace to quit smoking    Goals Met:  Independence with exercise equipment Exercise tolerated well No report of concerns or symptoms today Strength training completed today  Goals Unmet:  Not Applicable  Comments: Pt able to follow exercise prescription today without complaint.  Will continue to monitor for progression.    Dr. Emily Filbert is Medical Director for [place].  Dr. Ottie Glazier is Medical Director for [facility] Pulmonary Rehabilitation.

## 2022-12-12 ENCOUNTER — Encounter: Payer: 59 | Attending: Specialist | Admitting: *Deleted

## 2022-12-12 DIAGNOSIS — J449 Chronic obstructive pulmonary disease, unspecified: Secondary | ICD-10-CM | POA: Diagnosis present

## 2022-12-12 NOTE — Progress Notes (Signed)
Daily Session Note  Patient Details  Name: Nancy [name] MRN: [id number] Date of Birth: [birth year] Referring Provider:   Flowsheet Row Pulmonary Rehab from 11/18/2022 in [facility] Cardiac and Pulmonary Rehab  Referring Provider [name]       Encounter Date: 12/12/2022  Check In:  Session Check In - 12/12/22 1537       Check-In   Supervising physician immediately available to respond to emergencies See telemetry face sheet for immediately available ER MD    Location [facility]-Cardiac & Pulmonary Rehab    Staff Present Renita Papa, RN BSN;Joseph [place], RCP,RRT,BSRT;Jessica [place], [place], RCEP, CCRP, CCET    Virtual Visit No    Medication changes reported     No    Fall or balance concerns reported    No    Warm-up and Cool-down Performed on first and last piece of equipment    Resistance Training Performed Yes    VAD Patient? No    PAD/SET Patient? No      Pain Assessment   Currently in Pain? No/denies                Social History   Tobacco Use  Smoking Status Former   Packs/day: 0.50   Years: 30.00   Total pack years: 15.00   Types: Cigarettes   Quit date: 03/11/2016   Years since quitting: 6.7  Smokeless Tobacco Never  Tobacco Comments   patient states  that she is working at her own pace to quit smoking    Goals Met:  Independence with exercise equipment Exercise tolerated well No report of concerns or symptoms today Strength training completed today  Goals Unmet:  Not Applicable  Comments: Pt able to follow exercise prescription today without complaint.  Will continue to monitor for progression.    Dr. Emily Filbert is Medical Director for [place].  Dr. Ottie Glazier is Medical Director for [facility] Pulmonary Rehabilitation.

## 2022-12-16 ENCOUNTER — Encounter: Payer: 59 | Admitting: *Deleted

## 2022-12-16 DIAGNOSIS — J449 Chronic obstructive pulmonary disease, unspecified: Secondary | ICD-10-CM | POA: Diagnosis not present

## 2022-12-16 NOTE — Progress Notes (Signed)
Daily Session Note  Patient Details  Name: Nancy [name] MRN: [id number] Date of Birth: [birth year] Referring Provider:   Flowsheet Row Pulmonary Rehab from 11/18/2022 in [facility] Cardiac and Pulmonary Rehab  Referring Provider [name]       Encounter Date: 12/16/2022  Check In:  Session Check In - 12/16/22 1546       Check-In   Supervising physician immediately available to respond to emergencies See telemetry face sheet for immediately available ER MD    Location [facility]-Cardiac & Pulmonary Rehab    Staff Present Renita Papa, RN BSN;Noah Tickle, BS, Exercise Physiologist;Laureen Owens Shark, BS, RRT, CPFT    Virtual Visit No    Medication changes reported     No    Fall or balance concerns reported    No    Warm-up and Cool-down Performed on first and last piece of equipment    Resistance Training Performed Yes    VAD Patient? No    PAD/SET Patient? No      Pain Assessment   Currently in Pain? No/denies                Social History   Tobacco Use  Smoking Status Former   Packs/day: 0.50   Years: 30.00   Total pack years: 15.00   Types: Cigarettes   Quit date: 03/11/2016   Years since quitting: 6.7  Smokeless Tobacco Never  Tobacco Comments   patient states  that she is working at her own pace to quit smoking    Goals Met:  Independence with exercise equipment Exercise tolerated well No report of concerns or symptoms today Strength training completed today  Goals Unmet:  Not Applicable  Comments: Pt able to follow exercise prescription today without complaint.  Will continue to monitor for progression.'   Dr. Emily Filbert is Medical Director for [place].  Dr. Ottie Glazier is Medical Director for [facility] Pulmonary Rehabilitation.

## 2022-12-18 ENCOUNTER — Encounter: Payer: 59 | Admitting: *Deleted

## 2022-12-18 DIAGNOSIS — J449 Chronic obstructive pulmonary disease, unspecified: Secondary | ICD-10-CM

## 2022-12-18 NOTE — Progress Notes (Signed)
Daily Session Note  Patient Details  Name: Nancy [name] MRN: [id number] Date of Birth: Feb 04, 1961 Referring Provider:   Flowsheet Row Pulmonary Rehab from 11/18/2022 in [facility] Cardiac and Pulmonary Rehab  Referring Provider [name]       Encounter Date: 12/18/2022  Check In:  Session Check In - 12/18/22 1536       Check-In   Supervising physician immediately available to respond to emergencies See telemetry face sheet for immediately available ER MD    Location [facility]-Cardiac & Pulmonary Rehab    Staff Present Renita Papa, RN Odelia Gage, RN, Doyce Para, BS, ACSM CEP, Exercise Physiologist    Virtual Visit No    Medication changes reported     No    Fall or balance concerns reported    No    Warm-up and Cool-down Performed on first and last piece of equipment    Resistance Training Performed Yes    VAD Patient? No    PAD/SET Patient? No      Pain Assessment   Currently in Pain? No/denies                Social History   Tobacco Use  Smoking Status Former   Packs/day: 0.50   Years: 30.00   Total pack years: 15.00   Types: Cigarettes   Quit date: 03/11/2016   Years since quitting: 6.7  Smokeless Tobacco Never  Tobacco Comments   patient states  that she is working at her own pace to quit smoking    Goals Met:  Independence with exercise equipment Exercise tolerated well No report of concerns or symptoms today Strength training completed today  Goals Unmet:  Not Applicable  Comments: Pt able to follow exercise prescription today without complaint.  Will continue to monitor for progression.    Dr. Emily Filbert is Medical Director for [place].  Dr. Ottie Glazier is Medical Director for [facility] Pulmonary Rehabilitation.

## 2022-12-19 ENCOUNTER — Encounter: Payer: 59 | Admitting: *Deleted

## 2022-12-19 DIAGNOSIS — J449 Chronic obstructive pulmonary disease, unspecified: Secondary | ICD-10-CM | POA: Diagnosis not present

## 2022-12-19 NOTE — Progress Notes (Signed)
Daily Session Note  Patient Details  Name: Nancy [name] MRN: [id number] Date of Birth: [birth year] Referring Provider:   Flowsheet Row Pulmonary Rehab from 11/18/2022 in [facility] Cardiac and Pulmonary Rehab  Referring Provider [name]       Encounter Date: 12/19/2022  Check In:  Session Check In - 12/19/22 1536       Check-In   Supervising physician immediately available to respond to emergencies See telemetry face sheet for immediately available ER MD    Location [facility]-Cardiac & Pulmonary Rehab    Staff Present Renita Papa, RN BSN;Joseph Tessie Fass, RCP,RRT,BSRT;Noah [place], [place], Exercise Physiologist    Virtual Visit No    Medication changes reported     No    Fall or balance concerns reported    No    Warm-up and Cool-down Performed on first and last piece of equipment    Resistance Training Performed Yes    VAD Patient? No      Pain Assessment   Currently in Pain? No/denies                Social History   Tobacco Use  Smoking Status Former   Packs/day: 0.50   Years: 30.00   Total pack years: 15.00   Types: Cigarettes   Quit date: 03/11/2016   Years since quitting: 6.7  Smokeless Tobacco Never  Tobacco Comments   patient states  that she is working at her own pace to quit smoking    Goals Met:  Independence with exercise equipment Exercise tolerated well No report of concerns or symptoms today Strength training completed today  Goals Unmet:  Not Applicable  Comments: Pt able to follow exercise prescription today without complaint.  Will continue to monitor for progression.    Dr. Emily Filbert is Medical Director for [place].  Dr. Ottie Glazier is Medical Director for [facility] Pulmonary Rehabilitation.

## 2022-12-23 ENCOUNTER — Encounter: Payer: 59 | Admitting: *Deleted

## 2022-12-23 DIAGNOSIS — J449 Chronic obstructive pulmonary disease, unspecified: Secondary | ICD-10-CM | POA: Diagnosis not present

## 2022-12-23 NOTE — Progress Notes (Signed)
Daily Session Note  Patient Details  Name: Nancy [name] MRN: [id number] Date of Birth: [birth year] Referring Provider:   Flowsheet Row Pulmonary Rehab from 11/18/2022 in [facility] Cardiac and Pulmonary Rehab  Referring Provider [name]       Encounter Date: 12/23/2022  Check In:  Session Check In - 12/23/22 1539       Check-In   Supervising physician immediately available to respond to emergencies See telemetry face sheet for immediately available ER MD    Location [facility]-Cardiac & Pulmonary Rehab    Staff Present Renita Papa, RN BSN;Joseph [place], RCP,RRT,BSRT;Jessica [place], [place], RCEP, CCRP, CCET    Virtual Visit No    Medication changes reported     No    Fall or balance concerns reported    No    Warm-up and Cool-down Performed on first and last piece of equipment    Resistance Training Performed Yes    VAD Patient? No    PAD/SET Patient? No      Pain Assessment   Currently in Pain? No/denies                Social History   Tobacco Use  Smoking Status Former   Packs/day: 0.50   Years: 30.00   Total pack years: 15.00   Types: Cigarettes   Quit date: 03/11/2016   Years since quitting: 6.7  Smokeless Tobacco Never  Tobacco Comments   patient states  that she is working at her own pace to quit smoking    Goals Met:  Independence with exercise equipment Exercise tolerated well No report of concerns or symptoms today Strength training completed today  Goals Unmet:  Not Applicable  Comments: Pt able to follow exercise prescription today without complaint.  Will continue to monitor for progression.    Dr. Emily Filbert is Medical Director for [place].  Dr. Ottie Glazier is Medical Director for [facility] Pulmonary Rehabilitation.

## 2022-12-25 ENCOUNTER — Encounter: Payer: 59 | Admitting: *Deleted

## 2022-12-25 DIAGNOSIS — J449 Chronic obstructive pulmonary disease, unspecified: Secondary | ICD-10-CM | POA: Diagnosis not present

## 2022-12-25 NOTE — Progress Notes (Signed)
Daily Session Note  Patient Details  Name: Nancy [name] MRN: [id number] Date of Birth: 08-Dec-1960 Referring Provider:   Flowsheet Row Pulmonary Rehab from 11/18/2022 in [facility] Cardiac and Pulmonary Rehab  Referring Provider [name]       Encounter Date: 12/25/2022  Check In:  Session Check In - 12/25/22 1544       Check-In   Supervising physician immediately available to respond to emergencies See telemetry face sheet for immediately available ER MD    Location [facility]-Cardiac & Pulmonary Rehab    Staff Present Darlyne Russian, RN, Doyce Para, BS, ACSM CEP, Exercise Physiologist;Meredith Sherryll Burger, RN BSN;Joseph Tessie Fass, RCP,RRT,BSRT    Virtual Visit No    Medication changes reported     No    Fall or balance concerns reported    No    Warm-up and Cool-down Performed on first and last piece of equipment    Resistance Training Performed Yes    VAD Patient? No    PAD/SET Patient? No      Pain Assessment   Currently in Pain? No/denies                Social History   Tobacco Use  Smoking Status Former   Packs/day: 0.50   Years: 30.00   Total pack years: 15.00   Types: Cigarettes   Quit date: 03/11/2016   Years since quitting: 6.7  Smokeless Tobacco Never  Tobacco Comments   patient states  that she is working at her own pace to quit smoking    Goals Met:  Independence with exercise equipment Exercise tolerated well No report of concerns or symptoms today Strength training completed today  Goals Unmet:  Not Applicable  Comments: Pt able to follow exercise prescription today without complaint.  Will continue to monitor for progression.    Dr. Emily Filbert is Medical Director for [place].  Dr. Ottie Glazier is Medical Director for [facility] Pulmonary Rehabilitation.

## 2022-12-26 ENCOUNTER — Encounter: Payer: 59 | Admitting: *Deleted

## 2022-12-26 DIAGNOSIS — J449 Chronic obstructive pulmonary disease, unspecified: Secondary | ICD-10-CM | POA: Diagnosis not present

## 2022-12-26 NOTE — Progress Notes (Signed)
Daily Session Note  Patient Details  Name: Nancy [name] MRN: [id number] Date of Birth: [birth year] Referring Provider:   Flowsheet Row Pulmonary Rehab from 11/18/2022 in [facility] Cardiac and Pulmonary Rehab  Referring Provider [name]       Encounter Date: 12/26/2022  Check In:  Session Check In - 12/26/22 1531       Check-In   Supervising physician immediately available to respond to emergencies See telemetry face sheet for immediately available ER MD    Location [facility]-Cardiac & Pulmonary Rehab    Staff Present Renita Papa, RN BSN;Joseph Tessie Fass, RCP,RRT,BSRT;Noah [place], [place], Exercise Physiologist    Virtual Visit No    Medication changes reported     No    Fall or balance concerns reported    No    Warm-up and Cool-down Performed on first and last piece of equipment    Resistance Training Performed Yes    VAD Patient? No    PAD/SET Patient? No      Pain Assessment   Currently in Pain? No/denies                Social History   Tobacco Use  Smoking Status Former   Packs/day: 0.50   Years: 30.00   Total pack years: 15.00   Types: Cigarettes   Quit date: 03/11/2016   Years since quitting: 6.7  Smokeless Tobacco Never  Tobacco Comments   patient states  that she is working at her own pace to quit smoking    Goals Met:  Independence with exercise equipment Exercise tolerated well No report of concerns or symptoms today Strength training completed today  Goals Unmet:  Not Applicable  Comments: Pt able to follow exercise prescription today without complaint.  Will continue to monitor for progression.    Dr. Emily Filbert is Medical Director for [place].  Dr. Ottie Glazier is Medical Director for [facility] Pulmonary Rehabilitation.

## 2022-12-30 ENCOUNTER — Encounter: Payer: 59 | Admitting: *Deleted

## 2022-12-30 DIAGNOSIS — J449 Chronic obstructive pulmonary disease, unspecified: Secondary | ICD-10-CM

## 2022-12-30 NOTE — Progress Notes (Signed)
Daily Session Note  Patient Details  Name: IZABELLAH STETZEL MRN: [id number] Date of Birth: 06/21/1961 Referring Provider:   Flowsheet Row Pulmonary Rehab from 11/18/2022 in [facility] Cardiac and Pulmonary Rehab  Referring Provider [name]       Encounter Date: 12/30/2022  Check In:  Session Check In - 12/30/22 1549       Check-In   Supervising physician immediately available to respond to emergencies See telemetry face sheet for immediately available ER MD    Location [facility]-Cardiac & Pulmonary Rehab    Staff Present Renita Papa, RN BSN;Joseph Tessie Fass, RCP,RRT,BSRT;Noah [facility], [place], Exercise Physiologist    Virtual Visit No    Medication changes reported     No    Fall or balance concerns reported    No    Warm-up and Cool-down Performed on first and last piece of equipment    Resistance Training Performed Yes    VAD Patient? No    PAD/SET Patient? No      Pain Assessment   Currently in Pain? No/denies                Social History   Tobacco Use  Smoking Status Former   Packs/day: 0.50   Years: 30.00   Total pack years: 15.00   Types: Cigarettes   Quit date: 03/11/2016   Years since quitting: 6.8  Smokeless Tobacco Never  Tobacco Comments   patient states  that she is working at her own pace to quit smoking    Goals Met:  Independence with exercise equipment Exercise tolerated well No report of concerns or symptoms today Strength training completed today  Goals Unmet:  Not Applicable  Comments: Pt able to follow exercise prescription today without complaint.  Will continue to monitor for progression.    Dr. Emily Filbert is Medical Director for [place].  Dr. Ottie Glazier is Medical Director for [facility] Pulmonary Rehabilitation.

## 2023-01-01 ENCOUNTER — Encounter: Payer: 59 | Admitting: *Deleted

## 2023-01-01 ENCOUNTER — Encounter: Payer: Self-pay | Admitting: *Deleted

## 2023-01-01 DIAGNOSIS — J449 Chronic obstructive pulmonary disease, unspecified: Secondary | ICD-10-CM | POA: Diagnosis not present

## 2023-01-01 NOTE — Progress Notes (Signed)
Pulmonary Individual Treatment Plan  Patient Details  Name: Nancy [name] MRN: [id number] Date of Birth: December 05, 1960 Referring Provider:   Flowsheet Row Pulmonary Rehab from 11/18/2022 in [facility] Cardiac and Pulmonary Rehab  Referring Provider [name]       Initial Encounter Date:  Flowsheet Row Pulmonary Rehab from 11/18/2022 in [facility] Cardiac and Pulmonary Rehab  Date 11/18/22       Visit Diagnosis: Stage 3 severe COPD by GOLD classification ([place])  Patient's Home Medications on Admission:  Current Outpatient Medications:    albuterol (VENTOLIN HFA) 108 (90 Base) MCG/ACT inhaler, SMARTSIG:2 Inhalation Via Inhaler Every 6 Hours PRN, Disp: , Rfl:    amLODipine (NORVASC) 5 MG tablet, Take 5 mg by mouth daily., Disp: , Rfl:    aspirin EC 81 MG tablet, Take by mouth., Disp: , Rfl:    budesonide-formoterol (SYMBICORT) 160-4.5 MCG/ACT inhaler, Inhale 2 puffs into the lungs daily. , Disp: , Rfl:    carvedilol (COREG) 25 MG tablet, Take 25 mg by mouth 2 (two) times daily., Disp: , Rfl:    doxycycline (PERIOSTAT) 20 MG tablet, Take 1 tablet (20 mg total) by mouth 2 (two) times daily., Disp: 60 tablet, Rfl: 6   DULoxetine (CYMBALTA) 20 MG capsule, Take 2 capsules (40 mg total) by mouth daily. Please schedule office visit before any future refill., Disp: 60 capsule, Rfl: 0   Fluticasone-Umeclidin-Vilant 100-62.5-25 MCG/ACT AEPB, SMARTSIG:1 Inhalation Via Inhaler Daily, Disp: , Rfl:    furosemide (LASIX) 40 MG tablet, Take 40 mg by mouth daily., Disp: , Rfl:    levothyroxine (SYNTHROID) 125 MCG tablet, TAKE 1 TABLET BY MOUTH DAILY BEFORE BREAKFAST., Disp: 90 tablet, Rfl: 0   LORazepam (ATIVAN) 0.5 MG tablet, Take 1 tablet (0.5 mg total) by mouth every 8 (eight) hours as needed (nausea, recommend 6am, 2pm, 10pm dosing). (Patient not taking: Reported on 11/07/2022), Disp: 90 tablet, Rfl: 0   ondansetron (ZOFRAN ODT) 4 MG disintegrating tablet, Take 1 tablet (4 mg total) by mouth every 8 (eight) hours as  needed for nausea or vomiting. (Patient not taking: Reported on 11/07/2022), Disp: 20 tablet, Rfl: 2   spironolactone (ALDACTONE) 25 MG tablet, Take 25 mg by mouth daily., Disp: , Rfl:    topiramate (TOPAMAX) 50 MG tablet, Take 50 mg by mouth 2 (two) times daily. (Patient not taking: Reported on 12/03/2021), Disp: , Rfl:    valACYclovir (VALTREX) 1000 MG tablet, , Disp: , Rfl:    warfarin (COUMADIN) 2.5 MG tablet, Take 5 mg by mouth daily., Disp: , Rfl:   Current Facility-Administered Medications:    ipratropium-albuterol (DUONEB) 0.5-2.5 (3) MG/3ML nebulizer solution 3 mL, 3 mL, Nebulization, Once, Pollak, Adriana M, PA-C  Past Medical History: Past Medical History:  Diagnosis Date   Aortic dissection ([place]) 2010   COPD (chronic obstructive pulmonary disease) (HCC)    Depression    Hypertension    Hypothyroidism    Pneumonia    PONV (postoperative nausea and vomiting)    Rosacea    Seasonal allergies    Sleep apnea    Does not use CPAP    Tobacco Use: Social History   Tobacco Use  Smoking Status Former   Packs/day: 0.50   Years: 30.00   Total pack years: 15.00   Types: Cigarettes   Quit date: 03/11/2016   Years since quitting: 6.8  Smokeless Tobacco Never  Tobacco Comments   patient states  that she is working at her own pace to quit smoking    Labs:  Review Flowsheet       Latest Ref Rng & Units 07/02/2009 08/29/2015 01/05/2018 09/29/2020  Labs for ITP Cardiac and Pulmonary Rehab  Cholestrol 100 - 199 mg/dL - 176  185  167   LDL (calc) 0 - 99 mg/dL - 103  111  101   HDL-C >39 mg/dL - 53  50  45   Trlycerides 0 - 149 mg/dL - 102  119  116   TCO2 0 - 100 mmol/L 22  - - -     Pulmonary Assessment Scores:  Pulmonary Assessment Scores     Row Name 11/18/22 1739         ADL UCSD   SOB Score total 36     Rest 0     Walk 4     Stairs 4     Bath 3     Dress 1     Shop 3       CAT Score   CAT Score 16       mMRC Score   mMRC Score 2.5               UCSD: Self-administered rating of dyspnea associated with activities of daily living (ADLs) 6-point scale (0 = "not at all" to 5 = "maximal or unable to do because of breathlessness")  Scoring Scores range from 0 to 120.  Minimally important difference is 5 units  CAT: CAT can identify the health impairment of COPD patients and is better correlated with disease progression.  CAT has a scoring range of zero to 40. The CAT score is classified into four groups of low (less than 10), medium (10 - 20), high (21-30) and very high (31-40) based on the impact level of disease on health status. A CAT score over 10 suggests significant symptoms.  A worsening CAT score could be explained by an exacerbation, poor medication adherence, poor inhaler technique, or progression of COPD or comorbid conditions.  CAT MCID is 2 points  mMRC: mMRC (Modified Medical Research Council) Dyspnea Scale is used to assess the degree of baseline functional disability in patients of respiratory disease due to dyspnea. No minimal important difference is established. A decrease in score of 1 point or greater is considered a positive change.   Pulmonary Function Assessment:  Pulmonary Function Assessment - 11/18/22 1734       Initial Spirometry Results   FVC% 72 %    FEV1% 34 %    FEV1/FVC Ratio 38      Post Bronchodilator Spirometry Results   FVC% 79 %    FEV1% 37 %    FEV1/FVC Ratio 38             Exercise Target Goals: Exercise Program Goal: Individual exercise prescription set using results from initial 6 min walk test and THRR while considering  patient's activity barriers and safety.   Exercise Prescription Goal: Initial exercise prescription builds to 30-45 minutes a day of aerobic activity, 2-3 days per week.  Home exercise guidelines will be given to patient during program as part of exercise prescription that the participant will acknowledge.  Education: Aerobic Exercise: - Group verbal and visual  presentation on the components of exercise prescription. Introduces F.I.T.T principle from ACSM for exercise prescriptions.  Reviews F.I.T.T. principles of aerobic exercise including progression. Written material given at graduation. Flowsheet Row Pulmonary Rehab from 12/25/2022 in [facility] Cardiac and Pulmonary Rehab  Date 11/27/22  Educator KW  Instruction Review Code 1- Verbalizes Understanding  Education: Resistance Exercise: - Group verbal and visual presentation on the components of exercise prescription. Introduces F.I.T.T principle from ACSM for exercise prescriptions  Reviews F.I.T.T. principles of resistance exercise including progression. Written material given at graduation. Flowsheet Row Pulmonary Rehab from 12/25/2022 in [facility] Cardiac and Pulmonary Rehab  Date 12/04/22  Educator KW  Instruction Review Code 1- [employer] Understanding        Education: Exercise & Equipment Safety: - Individual verbal instruction and demonstration of equipment use and safety with use of the equipment. Flowsheet Row Pulmonary Rehab from 12/25/2022 in [facility] Cardiac and Pulmonary Rehab  Date 11/18/22  Educator [facility]  Instruction Review Code 1- Verbalizes Understanding       Education: Exercise Physiology & General Exercise Guidelines: - Group verbal and written instruction with models to review the exercise physiology of the cardiovascular system and associated critical values. Provides general exercise guidelines with specific guidelines to those with heart or lung disease.    Education: Flexibility, Balance, Mind/Body Relaxation: - Group verbal and visual presentation with interactive activity on the components of exercise prescription. Introduces F.I.T.T principle from ACSM for exercise prescriptions. Reviews F.I.T.T. principles of flexibility and balance exercise training including progression. Also discusses the mind body connection.  Reviews various relaxation techniques to help reduce and  manage stress (i.e. Deep breathing, progressive muscle relaxation, and visualization). Balance handout provided to take home. Written material given at graduation. Flowsheet Row Pulmonary Rehab from 12/25/2022 in [facility] Cardiac and Pulmonary Rehab  Date 12/11/22  Educator KW  Instruction Review Code 1- Verbalizes Understanding       Activity Barriers & Risk Stratification:  Activity Barriers & Cardiac Risk Stratification - 11/18/22 1716       Activity Barriers & Cardiac Risk Stratification   Activity Barriers None             6 Minute Walk:  6 Minute Walk     Row Name 11/18/22 1708         6 Minute Walk   Phase Initial     Distance 580 feet     Walk Time 4.5 minutes     # of Rest Breaks 4     MPH 1.46     METS 2.3     RPE 14     Perceived Dyspnea  3     VO2 Peak 8.04     Symptoms Yes (comment)  SOB     Resting HR 89 bpm     Resting BP 138/84     Resting Oxygen Saturation  93 %     Exercise Oxygen Saturation  during 6 min walk 94 %     Max Ex. HR 104 bpm     Max Ex. BP 146/72     2 Minute Post BP 122/60       Interval HR   1 Minute HR 99     2 Minute HR 104     3 Minute HR 95     4 Minute HR 95     5 Minute HR 95     6 Minute HR 98     2 Minute Post HR 98     Interval Heart Rate? Yes       Interval Oxygen   Interval Oxygen? Yes     Baseline Oxygen Saturation % 93 %     1 Minute Oxygen Saturation % 99 %     1 Minute Liters of Oxygen 2 L     2 Minute  Oxygen Saturation % 104 %     2 Minute Liters of Oxygen 2 L     3 Minute Oxygen Saturation % 95 %     3 Minute Liters of Oxygen 2 L     4 Minute Oxygen Saturation % 95 %     4 Minute Liters of Oxygen 2 L     5 Minute Oxygen Saturation % 95 %     5 Minute Liters of Oxygen 2 L     6 Minute Oxygen Saturation % 98 %     6 Minute Liters of Oxygen 2 L     2 Minute Post Oxygen Saturation % 98 %     2 Minute Post Liters of Oxygen 2 L             Oxygen Initial Assessment:  Oxygen Initial Assessment -  11/18/22 1732       Home Oxygen   Home Oxygen Device Portable Concentrator    Sleep Oxygen Prescription Continuous    Home Exercise Oxygen Prescription --    Liters per minute 2    Home Resting Oxygen Prescription --    Liters per minute 2    Compliance with Home Oxygen Use No      Initial 6 min Walk   Oxygen Used Continuous    Liters per minute 2      Program Oxygen Prescription   Program Oxygen Prescription Continuous    Liters per minute 2      Intervention   Short Term Goals To learn and understand importance of monitoring SPO2 with pulse oximeter and demonstrate accurate use of the pulse oximeter.;To learn and understand importance of maintaining oxygen saturations>88%;To learn and demonstrate proper pursed lip breathing techniques or other breathing techniques. ;To learn and demonstrate proper use of respiratory medications    Long  Term Goals Verbalizes importance of monitoring SPO2 with pulse oximeter and return demonstration;Maintenance of O2 saturations>88%;Exhibits proper breathing techniques, such as pursed lip breathing or other method taught during program session;Compliance with respiratory medication             Oxygen Re-Evaluation:  Oxygen Re-Evaluation     Row Name 11/21/22 1608 12/12/22 1208           Program Oxygen Prescription   Program Oxygen Prescription -- Continuous      Liters per minute -- 2        Home Oxygen   Home Oxygen Device -- Portable Concentrator      Sleep Oxygen Prescription -- Continuous      Liters per minute -- 2      Home Exercise Oxygen Prescription -- Continuous      Liters per minute -- 2      Home Resting Oxygen Prescription -- Continuous      Liters per minute -- 2      Compliance with Home Oxygen Use -- No        Goals/Expected Outcomes   Short Term Goals To learn and understand importance of monitoring SPO2 with pulse oximeter and demonstrate accurate use of the pulse oximeter.;To learn and understand importance of  maintaining oxygen saturations>88%;To learn and demonstrate proper pursed lip breathing techniques or other breathing techniques. ;To learn and demonstrate proper use of respiratory medications To learn and demonstrate proper pursed lip breathing techniques or other breathing techniques.       Long  Term Goals Verbalizes importance of monitoring SPO2 with pulse oximeter and return demonstration;Maintenance of O2 saturations>88%;Exhibits  proper breathing techniques, such as pursed lip breathing or other method taught during program session;Compliance with respiratory medication Exhibits proper breathing techniques, such as pursed lip breathing or other method taught during program session      Comments Reviewed PLB technique with pt.  Talked about how it works and it's importance in maintaining their exercise saturations. Diaphragmatic and PLB breathing explained and performed with patient. Patient has a better understanding of how to do these exercises to help with breathing performance and relaxation. Patient performed breathing techniques adequately and to practice further at home.      Goals/Expected Outcomes Short: Become more profiecient at using PLB.   Long: Become independent at using PLB. Short: practice PLB and diaphragmatic breathing at home. Long: Use PLB and diaphragmatic breathing independently post [employer].               Oxygen Discharge (Final Oxygen Re-Evaluation):  Oxygen Re-Evaluation - 12/12/22 1208       Program Oxygen Prescription   Program Oxygen Prescription Continuous    Liters per minute 2      Home Oxygen   Home Oxygen Device Portable Concentrator    Sleep Oxygen Prescription Continuous    Liters per minute 2    Home Exercise Oxygen Prescription Continuous    Liters per minute 2    Home Resting Oxygen Prescription Continuous    Liters per minute 2    Compliance with Home Oxygen Use No      Goals/Expected Outcomes   Short Term Goals To learn and demonstrate  proper pursed lip breathing techniques or other breathing techniques.     Long  Term Goals Exhibits proper breathing techniques, such as pursed lip breathing or other method taught during program session    Comments Diaphragmatic and PLB breathing explained and performed with patient. Patient has a better understanding of how to do these exercises to help with breathing performance and relaxation. Patient performed breathing techniques adequately and to practice further at home.    Goals/Expected Outcomes Short: practice PLB and diaphragmatic breathing at home. Long: Use PLB and diaphragmatic breathing independently post [employer].             Initial Exercise Prescription:  Initial Exercise Prescription - 11/18/22 1700       Date of Initial Exercise RX and Referring Provider   Date 11/18/22    Referring Provider [name]      Oxygen   Oxygen Continuous    Liters 2    Maintain Oxygen Saturation 88% or higher      Treadmill   MPH 0.8    Grade 0    Minutes 15    METs 1      Recumbant Bike   Level 1    RPM 50    Minutes 15    METs 1.46      NuStep   Level 1    SPM 80    Minutes 15    METs 1.46      REL-XR   Level 1    Speed 50    Minutes 15    METs 1.46      T5 Nustep   Level 1    SPM 80    Minutes 15    METs 2.3      Track   Laps 10    Minutes 15    METs 1.54      Prescription Details   Frequency (times per week) 3    Duration Progress  to 30 minutes of continuous aerobic without signs/symptoms of physical distress      Intensity   THRR 40-80% of Max Heartrate 117-146    Perceived Dyspnea 0-4      Progression   Progression Continue to progress workloads to maintain intensity without signs/symptoms of physical distress.      Resistance Training   Training Prescription Yes    Weight 2    Reps 10-15             Perform Capillary Blood Glucose checks as needed.  Exercise Prescription Changes:   Exercise Prescription Changes     Row Name  11/18/22 1700 11/25/22 1100 12/10/22 1300 12/23/22 1200       Response to Exercise   Blood Pressure (Admit) 138/84 116/58 118/60 118/60    Blood Pressure (Exercise) 146/72 128/64 144/80 --    Blood Pressure (Exit) 122/60 110/58 110/60 120/62    Heart Rate (Admit) 89 bpm 89 bpm 84 bpm 80 bpm    Heart Rate (Exercise) 104 bpm 98 bpm 96 bpm 100 bpm    Heart Rate (Exit) 98 bpm 85 bpm 89 bpm 93 bpm    Oxygen Saturation (Admit) 93 % 95 % 98 % 98 %    Oxygen Saturation (Exercise) 94 % 89 % 92 % 94 %    Oxygen Saturation (Exit) 98 % 94 % 96 % 98 %    Rating of Perceived Exertion (Exercise) 14 13 15 13    $ Perceived Dyspnea (Exercise) 3 3 3 2    $ Symptoms SOB SOB SOB SOB    Comments 6 MWT results 1st full day of exercise -- --    Duration -- Progress to 30 minutes of  aerobic without signs/symptoms of physical distress Progress to 30 minutes of  aerobic without signs/symptoms of physical distress Continue with 30 min of aerobic exercise without signs/symptoms of physical distress.    Intensity -- THRR unchanged THRR unchanged THRR unchanged      Progression   Progression -- Continue to progress workloads to maintain intensity without signs/symptoms of physical distress. Continue to progress workloads to maintain intensity without signs/symptoms of physical distress. Continue to progress workloads to maintain intensity without signs/symptoms of physical distress.    Average METs -- 1.69 2.12 2.43      Resistance Training   Training Prescription -- Yes Yes Yes    Weight -- 2 lb 2 lb 2 lb    Reps -- 10-15 10-15 10-15      Interval Training   Interval Training -- No No No      Oxygen   Oxygen -- Continuous Continuous Continuous    Liters -- 2 2 2      $ Treadmill   MPH -- 0.9 1.2 1.8    Grade -- 0 0 0.5    Minutes -- 15 15 15    $ METs -- 1.7 1.92 2.5      Recumbant Bike   Level -- -- 2 3    Watts -- -- -- 22    Minutes -- -- 15 15    METs -- -- 2.91 2.88      NuStep   Level -- 2 3 --     Minutes -- 15 15 15    $ METs -- 1.7 2.4 2.3      REL-XR   Level -- -- -- 1    Minutes -- -- -- 15    METs -- -- -- 2.9      T5  Nustep   Level -- -- 2 2    Minutes -- -- 15 15    METs -- -- 1.8 1.8      Oxygen   Maintain Oxygen Saturation -- -- 88% or higher 88% or higher             Exercise Comments:   Exercise Comments     Row Name 11/21/22 1606           Exercise Comments First full day of exercise!  Patient was oriented to gym and equipment including functions, settings, policies, and procedures.  Patient's individual exercise prescription and treatment plan were reviewed.  All starting workloads were established based on the results of the 6 minute walk test done at initial orientation visit.  The plan for exercise progression was also introduced and progression will be customized based on patient's performance and goals.                Exercise Goals and Review:   Exercise Goals     Row Name 11/18/22 1722             Exercise Goals   Increase Physical Activity Yes       Intervention Provide advice, education, support and counseling about physical activity/exercise needs.;Develop an individualized exercise prescription for aerobic and resistive training based on initial evaluation findings, risk stratification, comorbidities and participant's personal goals.       Expected Outcomes Short Term: Attend rehab on a regular basis to increase amount of physical activity.;Long Term: Add in home exercise to make exercise part of routine and to increase amount of physical activity.;Long Term: Exercising regularly at least 3-5 days a week.       Increase Strength and Stamina Yes       Intervention Provide advice, education, support and counseling about physical activity/exercise needs.;Develop an individualized exercise prescription for aerobic and resistive training based on initial evaluation findings, risk stratification, comorbidities and participant's personal  goals.       Expected Outcomes Short Term: Increase workloads from initial exercise prescription for resistance, speed, and METs.;Short Term: Perform resistance training exercises routinely during rehab and add in resistance training at home;Long Term: Improve cardiorespiratory fitness, muscular endurance and strength as measured by increased METs and functional capacity (6MWT)       Able to understand and use rate of perceived exertion (RPE) scale Yes       Intervention Provide education and explanation on how to use RPE scale       Expected Outcomes Short Term: Able to use RPE daily in rehab to express subjective intensity level;Long Term:  Able to use RPE to guide intensity level when exercising independently       Able to understand and use Dyspnea scale Yes       Intervention Provide education and explanation on how to use Dyspnea scale       Expected Outcomes Short Term: Able to use Dyspnea scale daily in rehab to express subjective sense of shortness of breath during exertion;Long Term: Able to use Dyspnea scale to guide intensity level when exercising independently       Knowledge and understanding of Target Heart Rate Range (THRR) Yes       Intervention Provide education and explanation of THRR including how the numbers were predicted and where they are located for reference       Expected Outcomes Short Term: Able to state/look up THRR;Long Term: Able to use THRR to govern intensity when  exercising independently;Short Term: Able to use daily as guideline for intensity in rehab       Able to check pulse independently Yes       Intervention Provide education and demonstration on how to check pulse in carotid and radial arteries.;Review the importance of being able to check your own pulse for safety during independent exercise       Expected Outcomes Short Term: Able to explain why pulse checking is important during independent exercise       Understanding of Exercise Prescription Yes        Intervention Provide education, explanation, and written materials on patient's individual exercise prescription       Expected Outcomes Short Term: Able to explain program exercise prescription;Long Term: Able to explain home exercise prescription to exercise independently                Exercise Goals Re-Evaluation :  Exercise Goals Re-Evaluation     Row Name 11/21/22 1607 11/25/22 1123 12/10/22 1400 12/23/22 1211       Exercise Goal Re-Evaluation   Exercise Goals Review Increase Physical Activity;Able to understand and use rate of perceived exertion (RPE) scale;Knowledge and understanding of Target Heart Rate Range (THRR);Understanding of Exercise Prescription;Increase Strength and Stamina;Able to check pulse independently;Able to understand and use Dyspnea scale Increase Physical Activity;Increase Strength and Stamina;Understanding of Exercise Prescription Increase Physical Activity;Increase Strength and Stamina;Understanding of Exercise Prescription Increase Physical Activity;Increase Strength and Stamina;Understanding of Exercise Prescription    Comments Reviewed RPE scale, THR and program prescription with pt today.  Pt voiced understanding and was given a copy of goals to take home. Kya is off to a good start with rehab for her first session. She was able to work at her initial exercise prescription and even went up to level 2 on the T4 Nustep.  RPEs were appropriate. We will continue to monitor as she progresses in the program. Jarelis is doing well in the program. She increased her overall average MET level to 2.12 METs. She also improved to level 2 on the T5 Nustep and level 3 on the T4 Nustep. She also increased her walking speed on the treadmill to 1.2 mph. We will continue to monitor her progress in the program. Fred continues to do well in rehab. She increased her treadmill speed to 1.8 mph and added a 0.5% incline. She also increased to level 3 on the recumbent bike and worked up to 22  watts! Her oxygen saturation is staying above 88%. We will continue to monitor her progression.    Expected Outcomes Short: Use RPE daily to regulate intensity.  Long: Follow program prescription in THR. Short: Continue to follow exercise prescription Long: Progressively increase overall MET level and stamina Short: Continue to increase treadmill workload. Long: Continue to improve strength and stamina. Short: Continue to increase treadmill speed Long: Continue to increase overall MET level and stamina             Discharge Exercise Prescription (Final Exercise Prescription Changes):  Exercise Prescription Changes - 12/23/22 1200       Response to Exercise   Blood Pressure (Admit) 118/60    Blood Pressure (Exit) 120/62    Heart Rate (Admit) 80 bpm    Heart Rate (Exercise) 100 bpm    Heart Rate (Exit) 93 bpm    Oxygen Saturation (Admit) 98 %    Oxygen Saturation (Exercise) 94 %    Oxygen Saturation (Exit) 98 %    Rating of Perceived  Exertion (Exercise) 13    Perceived Dyspnea (Exercise) 2    Symptoms SOB    Duration Continue with 30 min of aerobic exercise without signs/symptoms of physical distress.    Intensity THRR unchanged      Progression   Progression Continue to progress workloads to maintain intensity without signs/symptoms of physical distress.    Average METs 2.43      Resistance Training   Training Prescription Yes    Weight 2 lb    Reps 10-15      Interval Training   Interval Training No      Oxygen   Oxygen Continuous    Liters 2      Treadmill   MPH 1.8    Grade 0.5    Minutes 15    METs 2.5      Recumbant Bike   Level 3    Watts 22    Minutes 15    METs 2.88      NuStep   Minutes 15    METs 2.3      REL-XR   Level 1    Minutes 15    METs 2.9      T5 Nustep   Level 2    Minutes 15    METs 1.8      Oxygen   Maintain Oxygen Saturation 88% or higher             Nutrition:  Target Goals: Understanding of nutrition guidelines,  daily intake of sodium <1565m, cholesterol <2082m calories 30% from fat and 7% or less from saturated fats, daily to have 5 or more servings of fruits and vegetables.  Education: All About Nutrition: -Group instruction provided by verbal, written material, interactive activities, discussions, models, and posters to present general guidelines for heart healthy nutrition including fat, fiber, MyPlate, the role of sodium in heart healthy nutrition, utilization of the nutrition label, and utilization of this knowledge for meal planning. Follow up email sent as well. Written material given at graduation. Flowsheet [facility]  Education need identified 11/18/22       Biometrics:  Pre Biometrics - 11/18/22 1722       Pre Biometrics   Height 5' 7.5" (1.715 m)    Weight 190 lb 4.8 oz (86.3 kg)    Waist Circumference 40 inches    Hip Circumference 44 inches    Waist to Hip Ratio 0.91 %    BMI (Calculated) 29.35    Single Leg Stand 2.66 seconds              Nutrition Therapy Plan and Nutrition Goals:  Nutrition Therapy & Goals - 11/18/22 1723       Intervention Plan   Intervention Prescribe, educate and counsel regarding individualized specific dietary modifications aiming towards targeted core components such as weight, hypertension, lipid management, diabetes, heart failure and other comorbidities.    Expected Outcomes Short Term Goal: Understand basic principles of dietary content, such as calories, fat, sodium, cholesterol and nutrients.;Short Term Goal: A plan has been developed with personal nutrition goals set during dietitian appointment.;Long Term Goal: Adherence to prescribed nutrition plan.             Nutrition Assessments:  MEDIFICTS Score Key: ?70 Need to make dietary changes  40-70 Heart Healthy Diet ? 40 Therapeutic Level Cholesterol Diet  [facility]  Picture Your Plate  Total Score on Admission 41      Picture Your Plate Scores: [id number] Unhealthy dietary pattern with much room for improvement. 41-50 Dietary pattern unlikely to meet recommendations for good health and room for improvement. 51-60 More healthful dietary pattern, with some room for improvement.  >60 Healthy dietary pattern, although there may be some specific behaviors that could be improved.   Nutrition Goals Re-Evaluation:  Nutrition Goals Re-Evaluation     [place] Name 11/28/22 1604 12/12/22 1556           Goals   Current Weight 189 lb (85.7 kg) --      Nutrition Goal Manage Portion sizes Reduce diet soda intake      Comment Patient was informed on why it is important to maintain a balanced diet when dealing with Respiratory issues. Explained that it takes a lot of energy to breath and when they are short of breath often they will need to have a good diet to help keep up with the calories they are expending for breathing. Patient states she is drinking more soda that she wants to. She also drinks Mio mixed with water. She wants to try to lose more weight. She does not over eat.      Expected Outcome Short: Choose and plan snacks accordingly to patients caloric intake to improve breathing. Long: Maintain a diet independently that meets their caloric intake to aid in daily shortness of breath. Short: reduce soda intake. Long: adhere to a diet that pertains to her needs.               Nutrition Goals Discharge (Final Nutrition Goals Re-Evaluation):  Nutrition Goals Re-Evaluation - 12/12/22 1556       Goals   Nutrition Goal Reduce diet soda intake    Comment Patient states she is drinking more soda that she wants to. She also drinks Mio mixed with water. She wants to try to lose more weight. She does not over eat.    Expected Outcome Short: reduce soda intake. Long: adhere to a diet that pertains to her needs.             Psychosocial: Target  Goals: Acknowledge presence or absence of significant depression and/or stress, maximize coping skills, provide positive support system. Participant is able to verbalize types and ability to use techniques and skills needed for reducing stress and depression.   Education: Stress, Anxiety, and Depression - Group verbal and visual presentation to define topics covered.  Reviews how body is impacted by stress, anxiety, and depression.  Also discusses healthy ways to reduce stress and to treat/manage anxiety and depression.  Written material given at graduation.   Education: Sleep Hygiene -Provides group verbal and written instruction about how sleep can affect your health.  Define sleep hygiene, discuss sleep cycles and impact of sleep habits. Review good sleep hygiene tips.    Initial Review & Psychosocial Screening:  Initial Psych Review & Screening - 11/07/22 1437       Initial Review   Current issues with Current Depression      Family Dynamics   Good Support System? Yes   sister, family     Screening Interventions   Interventions Encouraged to exercise;Provide feedback about the scores to participant    Expected Outcomes Short Term goal: Utilizing psychosocial counselor, staff and physician to assist with identification of specific Stressors or current issues interfering with healing process. Setting desired goal for each stressor or current issue identified.;Long Term Goal: Stressors or  current issues are controlled or eliminated.;Short Term goal: Identification and review with participant of any Quality of Life or Depression concerns found by scoring the questionnaire.;Long Term goal: The participant improves quality of Life and PHQ9 Scores as seen by post scores and/or verbalization of changes             Quality of Life Scores:  Scores of 19 and below usually indicate a poorer quality of life in these areas.  A difference of  2-3 points is a clinically meaningful difference.  A  difference of 2-3 points in the total score of the Quality of Life Index has been associated with significant improvement in overall quality of life, self-image, physical symptoms, and general health in studies assessing change in quality of life.  PHQ-9: Review Flowsheet  More data may exist      11/18/2022 09/21/2021 09/18/2020 12/13/2019 01/02/2018  Depression screen PHQ 2/9  Decreased Interest 1 0 0 0 1  Down, Depressed, Hopeless 0 0 0 0 0  PHQ - 2 Score 1 0 0 0 1  Altered sleeping 0 2 - 0 -  Tired, decreased energy 3 3 - 0 -  Change in appetite 0 0 - 0 -  Feeling bad or failure about yourself  0 0 - 0 -  Trouble concentrating 0 0 - 0 -  Moving slowly or fidgety/restless 0 0 - 0 -  Suicidal thoughts 0 0 - 0 -  PHQ-9 Score 4 5 - 0 -  Difficult doing work/chores - Not difficult at all - Not difficult at all -   Interpretation of Total Score  Total Score Depression Severity:  1-4 = Minimal depression, 5-9 = Mild depression, 10-14 = Moderate depression, 15-19 = Moderately severe depression, 20-27 = Severe depression   Psychosocial Evaluation and Intervention:  Psychosocial Evaluation - 11/07/22 1442       Psychosocial Evaluation & Interventions   Interventions Encouraged to exercise with the program and follow exercise prescription    Comments Sheilla is coming to pulmonary rehab for COPD. She is on cymbalta and reports it is working well. 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 She recently got a concentrator but it was unclear over the phone as to her oxygen use. She reports feeling like she is managing her health well independenlty and also has a great support system of family close by.    Expected Outcomes Short: attend pulmonary rehab for education and exercise. Long: develop and maintain positive self care habits.    Continue Psychosocial Services  Follow up required by staff             Psychosocial Re-Evaluation:  Psychosocial Re-Evaluation      [place] Name 11/28/22 1605 12/12/22 1601           Psychosocial Re-Evaluation   Current issues with Current Psychotropic Meds;Current Depression;Current Anxiety/Panic Current Psychotropic Meds;Current Depression;Current Anxiety/Panic      Comments Her depression stems from her health. Her balance was off and when she was sitting she would feel like the room was spinning. She would feel anxious last January when all of this started. She has been okay in the last couple of weeks and doing well. She has more procedures coming up and is going to stay on Cymbalta to help her mood. 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 Patient reports no issues with their current mental states, sleep, stress, depression or anxiety. Will follow up  with patient in a few weeks for any changes. She says her mood is about the same as it was and takes her Celabrex as prescribed.      Expected Outcomes Short: continue medication for mood. Long: maintain medciation to keep mood stable. Short: Continue to exercise regularly to support mental health and notify staff of any changes. Long: maintain mental health and well being through teaching of rehab or prescribed medications independently.      Interventions Encouraged to attend Pulmonary Rehabilitation for the exercise Encouraged to attend Pulmonary Rehabilitation for the exercise      Continue Psychosocial Services  Follow up required by staff Follow up required by staff               Psychosocial Discharge (Final Psychosocial Re-Evaluation):  Psychosocial Re-Evaluation - 12/12/22 1601       Psychosocial Re-Evaluation   Current issues with Current Psychotropic Meds;Current Depression;Current Anxiety/Panic    Comments Patient reports no issues with their current mental states, sleep, stress, depression or anxiety. Will follow up with patient in a few weeks for any changes. She says her mood is about the same as it was and takes her Celabrex as  prescribed.    Expected Outcomes Short: Continue to exercise regularly to support mental health and notify staff of any changes. Long: maintain mental health and well being through teaching of rehab or prescribed medications independently.    Interventions Encouraged to attend Pulmonary Rehabilitation for the exercise    Continue Psychosocial Services  Follow up required by staff             Education: Education Goals: Education classes will be provided on a weekly basis, covering required topics. Participant will state understanding/return demonstration of topics presented.  Learning Barriers/Preferences:  Learning Barriers/Preferences - 11/07/22 1453       Learning Barriers/Preferences   Learning Barriers None    Learning Preferences Individual Instruction             General Pulmonary Education Topics:  Infection Prevention: - Provides verbal and written material to individual with discussion of infection control including proper hand washing and proper equipment cleaning during exercise session. Flowsheet Row Pulmonary Rehab from 12/25/2022 in [facility] Cardiac and Pulmonary Rehab  Date 11/18/22  Educator [facility]  Instruction Review Code 1- Verbalizes Understanding       Falls Prevention: - Provides verbal and written material to individual with discussion of falls prevention and safety. Flowsheet Row Pulmonary Rehab from 12/25/2022 in [facility] Cardiac and Pulmonary Rehab  Date 11/18/22  Educator [facility]  Instruction Review Code 1- Verbalizes Understanding       Chronic Lung Disease Review: - Group verbal instruction with posters, models, PowerPoint presentations and videos,  to review new updates, new respiratory medications, new advancements in procedures and treatments. Providing information on websites and "800" numbers for continued self-education. Includes information about supplement oxygen, available portable oxygen systems, continuous and intermittent flow rates, oxygen  safety, concentrators, and Medicare reimbursement for oxygen. Explanation of Pulmonary Drugs, including class, frequency, complications, importance of spacers, rinsing mouth after steroid MDI's, and proper cleaning methods for nebulizers. Review of basic lung anatomy and physiology related to function, structure, and complications of lung disease. Review of risk factors. Discussion about methods for diagnosing sleep apnea and types of masks and machines for OSA. Includes a review of the use of types of environmental controls: home humidity, furnaces, filters, dust mite/pet prevention, HEPA vacuums. Discussion about weather changes, air quality and the benefits  of nasal washing. Instruction on Warning signs, infection symptoms, calling MD promptly, preventive modes, and value of vaccinations. Review of effective airway clearance, coughing and/or vibration techniques. Emphasizing that all should Create an Action Plan. Written material given at graduation. Flowsheet Row Pulmonary Rehab from 12/25/2022 in [facility] and Pulmonary Rehab  Education need identified 11/18/22       AED/CPR: - Group verbal and written instruction with the use of models to demonstrate the basic use of the AED with the basic ABC's of resuscitation.    Anatomy and Cardiac Procedures: - Group verbal and visual presentation and models provide information about basic cardiac anatomy and function. Reviews the testing methods done to diagnose heart disease and the outcomes of the test results. Describes the treatment choices: Medical Management, Angioplasty, or Coronary Bypass Surgery for treating various heart conditions including Myocardial Infarction, Angina, Valve Disease, and Cardiac Arrhythmias.  Written material given at graduation. Flowsheet Row Pulmonary Rehab from 12/25/2022 in [facility] Cardiac and Pulmonary Rehab  Date 12/18/22  Educator SB  Instruction Review Code 1- Verbalizes Understanding       Medication Safety: -  Group verbal and visual instruction to review commonly prescribed medications for heart and lung disease. Reviews the medication, class of the drug, and side effects. Includes the steps to properly store meds and maintain the prescription regimen.  Written material given at graduation.   Other: -Provides group and verbal instruction on various topics (see comments) Flowsheet Row Pulmonary Rehab from 12/25/2022 in [facility] Cardiac and Pulmonary Rehab  Date 12/25/22  Educator SB  Instruction Review Code 1- Verbalizes Understanding  [Jeopardy]       Knowledge Questionnaire Score:  Knowledge Questionnaire Score - 11/18/22 1729       Knowledge Questionnaire Score   Pre Score 16/18              Core Components/Risk Factors/Patient Goals at Admission:  Personal Goals and Risk Factors at Admission - 11/18/22 1723       Core Components/Risk Factors/Patient Goals on Admission    Weight Management Yes    Intervention Weight Management: Develop a combined nutrition and exercise program designed to reach desired caloric intake, while maintaining appropriate intake of nutrient and fiber, sodium and fats, and appropriate energy expenditure required for the weight goal.;Weight Management: Provide education and appropriate resources to help participant work on and attain dietary goals.;Weight Management/Obesity: Establish reasonable short term and long term weight goals.    Admit Weight 190 lb 4.8 oz (86.3 kg)    Goal Weight: Short Term 185 lb (83.9 kg)    Goal Weight: Long Term 180 lb (81.6 kg)    Expected Outcomes Short Term: Continue to assess and modify interventions until short term weight is achieved;Long Term: Adherence to nutrition and physical activity/exercise program aimed toward attainment of established weight goal;Weight Loss: Understanding of general recommendations for a balanced deficit meal plan, which promotes 1-2 lb weight loss per week and includes a negative energy balance of  [phone] kcal/d;Understanding recommendations for meals to include 15-35% energy as protein, 25-35% energy from fat, 35-60% energy from carbohydrates, less than 218m of dietary cholesterol, 20-35 gm of total fiber daily;Understanding of distribution of calorie intake throughout the day with the consumption of 4-5 meals/snacks    Hypertension Yes    Intervention Provide education on lifestyle modifcations including regular physical activity/exercise, weight management, moderate sodium restriction and increased consumption of fresh fruit, vegetables, and low fat dairy, alcohol moderation, and smoking cessation.;Monitor prescription use compliance.  Expected Outcomes Short Term: Continued assessment and intervention until BP is < 140/56m HG in hypertensive participants. < 130/864mHG in hypertensive participants with diabetes, heart failure or chronic kidney disease.;Long Term: Maintenance of blood pressure at goal levels.             Education:Diabetes - Individual verbal and written instruction to review signs/symptoms of diabetes, desired ranges of glucose level fasting, after meals and with exercise. Acknowledge that pre and post exercise glucose checks will be done for 3 sessions at entry of program.   Know Your Numbers and Heart Failure: - Group verbal and visual instruction to discuss disease risk factors for cardiac and pulmonary disease and treatment options.  Reviews associated critical values for Overweight/Obesity, Hypertension, Cholesterol, and Diabetes.  Discusses basics of heart failure: signs/symptoms and treatments.  Introduces Heart Failure Zone chart for action plan for heart failure.  Written material given at graduation.   Core Components/Risk Factors/Patient Goals Review:   Goals and Risk Factor Review     Row Name 11/28/22 1603 12/12/22 1559           Core Components/Risk Factors/Patient Goals Review   Personal Goals Review Improve shortness of breath with ADL's  Weight Management/Obesity      Review Spoke to patient about their shortness of breath and what they can do to improve. Patient has been informed of breathing techniques when starting the program. Patient is informed to tell staff if they have had any med changes and that certain meds they are taking or not taking can be causing shortness of breath. LoTzivyants to lose around 25 pounds to help with her breathing. She does not think she can do it but assured her that if her diet is right and she keeps exercising she can do it.      Expected Outcomes Short: Attend [facility] regularly to improve shortness of breath with ADL's. Long: maintain independence with ADL's Short: lose a few pounds in the next few weeks. Long: Reach 25 pounds weight loss.               Core Components/Risk Factors/Patient Goals at Discharge (Final Review):   Goals and Risk Factor Review - 12/12/22 1559       Core Components/Risk Factors/Patient Goals Review   Personal Goals Review Weight Management/Obesity    Review LoRaniahants to lose around 25 pounds to help with her breathing. She does not think she can do it but assured her that if her diet is right and she keeps exercising she can do it.    Expected Outcomes Short: lose a few pounds in the next few weeks. Long: Reach 25 pounds weight loss.             ITP Comments:  ITP Comments     Row Name 11/07/22 1442 11/18/22 1708 11/21/22 1606 12/04/22 1052 01/01/23 1507   ITP Comments Initial phone call completed. [facility] EP Orientation scheduled for Monday 1/8 at 3pm. Completed 6MWT and gym orientation. Initial ITP created and sent for review to Dr. FaZetta BillsMedical Director. First full day of exercise!  Patient was oriented to gym and equipment including functions, settings, policies, and procedures.  Patient's individual exercise prescription and treatment plan were reviewed.  All starting workloads were established based on the  results of the 6 minute walk test done at initial orientation visit.  The plan for exercise progression was also introduced and progression will be customized based on  patient's performance and goals. 30 Day review completed. Medical Director ITP review done, changes made as directed, and signed approval by Medical Director.   new to propgram 30 day review completed. ITP sent to Dr. Zetta Bills, Medical Director of  Pulmonary Rehab. Continue with ITP unless changes are made by physician.            Comments: 30 day review

## 2023-01-01 NOTE — Progress Notes (Signed)
Daily Session Note  Patient Details  Name: Nancy [name] MRN: [id number] Date of Birth: 1960/12/20 Referring Provider:   Flowsheet Row Pulmonary Rehab from 11/18/2022 in [facility] Cardiac and Pulmonary Rehab  Referring Provider [name]       Encounter Date: 01/01/2023  Check In:  Session Check In - 01/01/23 1539       Check-In   Supervising physician immediately available to respond to emergencies See telemetry face sheet for immediately available ER MD    Location [facility]-Cardiac & Pulmonary Rehab    Staff Present Renita Papa, RN Moises Blood, BS, ACSM CEP, Exercise Physiologist;Joseph Laurence Spates, MS, ACSM CEP, Exercise Physiologist    Virtual Visit No    Medication changes reported     No    Fall or balance concerns reported    No    Warm-up and Cool-down Performed on first and last piece of equipment    Resistance Training Performed Yes    VAD Patient? No    PAD/SET Patient? No      Pain Assessment   Currently in Pain? No/denies                Social History   Tobacco Use  Smoking Status Former   Packs/day: 0.50   Years: 30.00   Total pack years: 15.00   Types: Cigarettes   Quit date: 03/11/2016   Years since quitting: 6.8  Smokeless Tobacco Never  Tobacco Comments   patient states  that she is working at her own pace to quit smoking    Goals Met:  Independence with exercise equipment Exercise tolerated well No report of concerns or symptoms today Strength training completed today  Goals Unmet:  Not Applicable  Comments: Pt able to follow exercise prescription today without complaint.  Will continue to monitor for progression.    Dr. Emily Filbert is Medical Director for [place].  Dr. Ottie Glazier is Medical Director for [facility].

## 2023-01-02 ENCOUNTER — Encounter: Payer: 59 | Admitting: *Deleted

## 2023-01-02 DIAGNOSIS — J449 Chronic obstructive pulmonary disease, unspecified: Secondary | ICD-10-CM

## 2023-01-02 NOTE — Progress Notes (Signed)
Daily Session Note  Patient Details  Name: Nancy [name] MRN: [id number] Date of Birth: 1961/09/22 Referring Provider:   Flowsheet Row Pulmonary Rehab from 11/18/2022 in [facility] Cardiac and Pulmonary Rehab  Referring Provider [name]       Encounter Date: 01/02/2023  Check In:  Session Check In - 01/02/23 1529       Check-In   Supervising physician immediately available to respond to emergencies See telemetry face sheet for immediately available ER MD    Location [facility]-Cardiac & Pulmonary Rehab    Staff Present Darlyne Russian, RN, ADN;Jessica Luan [place], MA, RCEP, CCRP, CCET;Joseph [place], [place]    Virtual Visit No    Medication changes reported     No    Fall or balance concerns reported    No    Warm-up and Cool-down Performed on first and last piece of equipment    Resistance Training Performed Yes    VAD Patient? No    PAD/SET Patient? No      Pain Assessment   Currently in Pain? No/denies                Social History   Tobacco Use  Smoking Status Former   Packs/day: 0.50   Years: 30.00   Total pack years: 15.00   Types: Cigarettes   Quit date: 03/11/2016   Years since quitting: 6.8  Smokeless Tobacco Never  Tobacco Comments   patient states  that she is working at her own pace to quit smoking    Goals Met:  Independence with exercise equipment Exercise tolerated well No report of concerns or symptoms today Strength training completed today  Goals Unmet:  Not Applicable  Comments: Pt able to follow exercise prescription today without complaint.  Will continue to monitor for progression.    Dr. Emily Filbert is Medical Director for [place].  Dr. Ottie Glazier is Medical Director for [facility] Pulmonary Rehabilitation.

## 2023-01-03 ENCOUNTER — Other Ambulatory Visit: Payer: Self-pay | Admitting: Neurosurgery

## 2023-01-03 DIAGNOSIS — I671 Cerebral aneurysm, nonruptured: Secondary | ICD-10-CM

## 2023-01-06 ENCOUNTER — Encounter: Payer: 59 | Admitting: *Deleted

## 2023-01-06 DIAGNOSIS — J449 Chronic obstructive pulmonary disease, unspecified: Secondary | ICD-10-CM

## 2023-01-06 NOTE — Progress Notes (Signed)
Daily Session Note  Patient Details  Name: Nancy [name] MRN: [id number] Date of Birth: [birth year] Referring Provider:   Flowsheet Row Pulmonary Rehab from 11/18/2022 in [facility] Cardiac and Pulmonary Rehab  Referring Provider [name]       Encounter Date: 01/06/2023  Check In:  Session Check In - 01/06/23 1553       Check-In   Supervising physician immediately available to respond to emergencies See telemetry face sheet for immediately available ER MD    Location [facility]-Cardiac & Pulmonary Rehab    Staff Present Renita Papa, RN BSN;Noah Tickle, BS, Exercise Physiologist;Megan Tamala Julian, RN, ADN    Virtual Visit No    Medication changes reported     No    Fall or balance concerns reported    No    Warm-up and Cool-down Performed on first and last piece of equipment    Resistance Training Performed Yes    VAD Patient? No    PAD/SET Patient? No      Pain Assessment   Currently in Pain? No/denies                Social History   Tobacco Use  Smoking Status Former   Packs/day: 0.50   Years: 30.00   Total pack years: 15.00   Types: Cigarettes   Quit date: 03/11/2016   Years since quitting: 6.8  Smokeless Tobacco Never  Tobacco Comments   patient states  that she is working at her own pace to quit smoking    Goals Met:  Independence with exercise equipment Exercise tolerated well No report of concerns or symptoms today Strength training completed today  Goals Unmet:  Not Applicable  Comments: Pt able to follow exercise prescription today without complaint.  Will continue to monitor for progression.    Dr. Emily Filbert is Medical Director for [place].  Dr. Ottie Glazier is Medical Director for [facility] Pulmonary Rehabilitation.

## 2023-01-13 DIAGNOSIS — J449 Chronic obstructive pulmonary disease, unspecified: Secondary | ICD-10-CM

## 2023-01-14 ENCOUNTER — Telehealth: Payer: Self-pay | Admitting: *Deleted

## 2023-01-14 NOTE — Transitions of Care (Post Inpatient/ED Visit) (Signed)
   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 How have you been since you were released from the hospital?: Better (I am doing good) Any questions or concerns?: No  Items Reviewed: Did you receive and understand the discharge instructions provided?: Yes Medications obtained and verified?: Yes (Medications Reviewed) Any new allergies since your discharge?: No Dietary orders reviewed?: No Do you have support at home?: Yes People in Home: sibling(s) Name of Support/Comfort Primary Source: Amy sister  Home Care and Equipment/Supplies: [place] Ordered?: No Any new equipment or medical supplies ordered?: No  Functional Questionnaire: Do you need assistance with bathing/showering or dressing?: Yes Do you need assistance with meal preparation?: Yes Do you need assistance with eating?: No Do you have difficulty maintaining continence: No Do you need assistance with getting out of bed/getting out of a chair/moving?: No Do you have difficulty managing or taking your medications?: No  Folllow up appointments reviewed: PCP Follow-up appointment confirmed?: [facility] Follow-up appointment confirmed?: Yes Date of Specialist follow-up appointment?: 01/14/23 Follow-Up Specialty Provider:: [id number] [facility], 0408/2024 [facility] 1:00, 02/16/2026 [facility] 2:40 [place], 02/17/2023 3:15 [facility]  Dr Cheree Ditto Do you need transportation to your follow-up appointment?: No Do you understand care options if your condition(s)  worsen?: Yes-patient verbalized understanding  SDOH Interventions Today    Flowsheet Row Most Recent Value  SDOH Interventions   Food Insecurity Interventions Intervention Not Indicated  Housing Interventions Intervention Not Indicated  Transportation Interventions Intervention Not Indicated      Interventions Today    Flowsheet Row Most Recent Value  General Interventions   General Interventions Discussed/Reviewed Doctor Visits, General Interventions Discussed, General Interventions Reviewed  [Patient starts rehab [id number]  Doctor Visits Discussed/Reviewed Doctor Visits Reviewed, Doctor Visits Discussed        [facility] [phone]

## 2023-01-15 ENCOUNTER — Encounter: Payer: 59 | Attending: Specialist | Admitting: *Deleted

## 2023-01-15 ENCOUNTER — Ambulatory Visit: Payer: BC Managed Care – PPO | Admitting: Dermatology

## 2023-01-15 DIAGNOSIS — Z87891 Personal history of nicotine dependence: Secondary | ICD-10-CM | POA: Diagnosis not present

## 2023-01-15 DIAGNOSIS — J449 Chronic obstructive pulmonary disease, unspecified: Secondary | ICD-10-CM | POA: Diagnosis present

## 2023-01-15 NOTE — Progress Notes (Signed)
Daily Session Note  Patient Details  Name: Nancy [name] MRN: [id number] Date of Birth: [birth year] Referring Provider:   Flowsheet Row Pulmonary Rehab from 11/18/2022 in [facility] Cardiac and Pulmonary Rehab  Referring Provider [name]       Encounter Date: 01/15/2023  Check In:  Session Check In - 01/15/23 1539       Check-In   Supervising physician immediately available to respond to emergencies See telemetry face sheet for immediately available ER MD    Location [facility]-Cardiac & Pulmonary Rehab    Staff Present Renita Papa, RN Moises Blood, BS, ACSM CEP, Exercise Physiologist;Megan Tamala Julian, RN, ADN    Virtual Visit No    Medication changes reported     Yes    Comments stopped carvedilol    Fall or balance concerns reported    No    Warm-up and Cool-down Performed on first and last piece of equipment    Resistance Training Performed Yes    VAD Patient? No    PAD/SET Patient? No      Pain Assessment   Currently in Pain? No/denies                Social History   Tobacco Use  Smoking Status Former   Packs/day: 0.50   Years: 30.00   Total pack years: 15.00   Types: Cigarettes   Quit date: 03/11/2016   Years since quitting: 6.8  Smokeless Tobacco Never  Tobacco Comments   patient states  that she is working at her own pace to quit smoking    Goals Met:  Independence with exercise equipment Exercise tolerated well No report of concerns or symptoms today Strength training completed today  Goals Unmet:  Not Applicable  Comments: Pt able to follow exercise prescription today without complaint.  Will continue to monitor for progression.    Dr. Emily Filbert is Medical Director for [place].  Dr. Ottie Glazier is Medical Director for [facility] Pulmonary Rehabilitation.

## 2023-01-16 ENCOUNTER — Telehealth: Payer: Self-pay | Admitting: Family Medicine

## 2023-01-16 NOTE — Telephone Encounter (Signed)
Pt is requesting a refill on  DULoxetine (CYMBALTA) 20 MG capsule.  Pt has been out for 2 days.  Pharmacy was supposed to send  a refill request.  She would like sent to [facility]

## 2023-01-17 ENCOUNTER — Other Ambulatory Visit: Payer: Self-pay | Admitting: Family Medicine

## 2023-01-17 DIAGNOSIS — F339 Major depressive disorder, recurrent, unspecified: Secondary | ICD-10-CM

## 2023-01-17 MED ORDER — DULOXETINE HCL 20 MG PO CPEP: 40.0000 mg | ORAL_CAPSULE | Freq: Every day | ORAL | 0 refills | Status: DC

## 2023-01-17 NOTE — Telephone Encounter (Signed)
Notified pt medication been sent and  pt -scheduled 01/28/23 @ 3:20 pm.

## 2023-01-17 NOTE — Telephone Encounter (Signed)
Please advise 

## 2023-01-20 ENCOUNTER — Encounter: Payer: 59 | Admitting: *Deleted

## 2023-01-20 DIAGNOSIS — J449 Chronic obstructive pulmonary disease, unspecified: Secondary | ICD-10-CM | POA: Diagnosis not present

## 2023-01-20 NOTE — Progress Notes (Signed)
Daily Session Note  Patient Details  Name: Nancy TURRELL MRN: [id number] Date of Birth: [birth year] Referring Provider:   Flowsheet Row Pulmonary Rehab from 11/18/2022 in [facility] Cardiac and Pulmonary Rehab  Referring Provider [name]       Encounter Date: 01/20/2023  Check In:  Session Check In - 01/20/23 1608       Check-In   Supervising physician immediately available to respond to emergencies See telemetry face sheet for immediately available ER MD    Location [facility]-Card[facility] & Pulmonary Rehab    Staff Present Darlyne Russian, RN, ADN;Joseph Tessie Fass, RCP,RRT,BSRT;Noah Tickle, BS, Exercise Physiologist;Kara Maricela Bo, MS, ACSM CEP, Exercise Physiologist    Virtual Visit No    Medication changes reported     No    Fall or balance concerns reported    No    Warm-up and Cool-down Performed on first and last piece of equipment    Resistance Training Performed Yes    VAD Patient? No    PAD/SET Patient? No      Pain Assessment   Currently in Pain? No/denies                Social History   Tobacco Use  Smoking Status Former   Packs/day: 0.50   Years: 30.00   Total pack years: 15.00   Types: Cigarettes   Quit date: 03/11/2016   Years since quitting: 6.8  Smokeless Tobacco Never  Tobacco Comments   patient states  that she is working at her own pace to quit smoking    Goals Met:  Independence with exercise equipment Exercise tolerated well No report of concerns or symptoms today Strength training completed today  Goals Unmet:  Not Applicable  Comments: Pt able to follow exercise prescription today without complaint.  Will continue to monitor for progression.    Dr. Emily Filbert is Medical Director for [place].  Dr. Ottie Glazier is Medical Director for [facility] Pulmonary Rehabilitation.

## 2023-01-22 ENCOUNTER — Encounter: Payer: 59 | Admitting: *Deleted

## 2023-01-22 ENCOUNTER — Other Ambulatory Visit: Payer: Self-pay | Admitting: Family Medicine

## 2023-01-22 DIAGNOSIS — J449 Chronic obstructive pulmonary disease, unspecified: Secondary | ICD-10-CM | POA: Diagnosis not present

## 2023-01-22 DIAGNOSIS — E038 Other specified hypothyroidism: Secondary | ICD-10-CM

## 2023-01-22 NOTE — Progress Notes (Signed)
Daily Session Note  Patient Details  Name: BEONKA KENNERLY MRN: [id number] Date of Birth: 1960/11/30 Referring Provider:   Flowsheet Row Pulmonary Rehab from 11/18/2022 in [facility] Cardiac and Pulmonary Rehab  Referring Provider [name]       Encounter Date: 01/22/2023  Check In:  Session Check In - 01/22/23 1538       Check-In   Supervising physician immediately available to respond to emergencies See telemetry face sheet for immediately available ER MD    Location [facility]-Cardiac & Pulmonary Rehab    Staff Present Renita Papa, RN BSN;Joseph Tessie Fass, RCP,RRT,BSRT;Megan Tamala Julian, RN, [place]    Virtual Visit No    Medication changes reported     No    Fall or balance concerns reported    No    Warm-up and Cool-down Performed on first and last piece of equipment    Resistance Training Performed Yes    VAD Patient? No    PAD/SET Patient? No      Pain Assessment   Currently in Pain? No/denies                Social History   Tobacco Use  Smoking Status Former   Packs/day: 0.50   Years: 30.00   Total pack years: 15.00   Types: Cigarettes   Quit date: 03/11/2016   Years since quitting: 6.8  Smokeless Tobacco Never  Tobacco Comments   patient states  that she is working at her own pace to quit smoking    Goals Met:  Independence with exercise equipment Exercise tolerated well No report of concerns or symptoms today Strength training completed today  Goals Unmet:  Not Applicable  Comments: Pt able to follow exercise prescription today without complaint.  Will continue to monitor for progression.    Dr. Emily Filbert is Medical Director for [place].  Dr. Ottie Glazier is Medical Director for [facility] Pulmonary Rehabilitation.

## 2023-01-23 ENCOUNTER — Encounter: Payer: 59 | Admitting: *Deleted

## 2023-01-23 DIAGNOSIS — J449 Chronic obstructive pulmonary disease, unspecified: Secondary | ICD-10-CM | POA: Diagnosis not present

## 2023-01-23 NOTE — Progress Notes (Signed)
Daily Session Note  Patient Details  Name: Nancy [name] MRN: [id number] Date of Birth: [birth year] Referring Provider:   Flowsheet Row Pulmonary Rehab from 11/18/2022 in [facility] Cardiac and Pulmonary Rehab  Referring Provider [name]       Encounter Date: 01/23/2023  Check In:  Session Check In - 01/23/23 1532       Check-In   Supervising physician immediately available to respond to emergencies See telemetry face sheet for immediately available ER MD    Location [facility]-Cardiac & Pulmonary Rehab    Staff Present Renita Papa, RN BSN;Noah Tickle, BS, Exercise Physiologist;Joseph Tessie Fass, [place]    Virtual Visit No    Medication changes reported     No    Fall or balance concerns reported    No    Warm-up and Cool-down Performed on first and last piece of equipment    Resistance Training Performed Yes    VAD Patient? No    PAD/SET Patient? No      Pain Assessment   Currently in Pain? No/denies                Social History   Tobacco Use  Smoking Status Former   Packs/day: 0.50   Years: 30.00   Additional pack years: 0.00   Total pack years: 15.00   Types: Cigarettes   Quit date: 03/11/2016   Years since quitting: 6.8  Smokeless Tobacco Never  Tobacco Comments   patient states  that she is working at her own pace to quit smoking    Goals Met:  Independence with exercise equipment Exercise tolerated well No report of concerns or symptoms today Strength training completed today  Goals Unmet:  Not Applicable  Comments: Pt able to follow exercise prescription today without complaint.  Will continue to monitor for progression.    Dr. Emily Filbert is Medical Director for [place].  Dr. Ottie Glazier is Medical Director for [facility] Pulmonary Rehabilitation.

## 2023-01-23 NOTE — Telephone Encounter (Signed)
Requested medication (s) are due for refill today: yes  Requested medication (s) are on the active medication list: yes  Last refill:  10/09/22 #90 0 refills  Future visit scheduled: yes in 5 days   Notes to clinic:  protocol failed last lab 09/21/21. No refills remain  do you want to give courtesy refill?     Requested Prescriptions  Pending Prescriptions Disp Refills   levothyroxine (SYNTHROID) 125 MCG tablet [Pharmacy Med Name: LEVOTHYROXINE SODIUM 125 MCG TAB] 90 tablet 0    Sig: TAKE 1 TABLET BY MOUTH DAILY BEFORE BREAKFAST.     Endocrinology:  Hypothyroid Agents Failed - 01/22/2023  6:03 PM      Failed - TSH in normal range and within 360 days    TSH  Date Value Ref Range Status  09/21/2021 2.570 0.450 - 4.500 uIU/mL Final         Failed - Valid encounter within last 12 months    Recent Outpatient Visits           1 year ago Depression, recurrent [facility])   [place] Gwyneth Sprout, FNP   1 year ago [place], Donald E, MD   2 years ago Annual physical exam   [place], Vickki Muff, PA-C   3 years ago Essential (primary) hypertension   [place], Dionne Bucy, MD   4 years ago Nausea   [place], PA-C       Future Appointments             In 5 days Gwyneth Sprout, [place], [place]

## 2023-01-27 ENCOUNTER — Encounter: Payer: 59 | Admitting: *Deleted

## 2023-01-27 ENCOUNTER — Ambulatory Visit
Admission: RE | Admit: 2023-01-27 | Discharge: 2023-01-27 | Disposition: A | Discharge status: Home / Self Care | Payer: 59 | Source: Ambulatory Visit | Attending: Neurosurgery | Admitting: Neurosurgery

## 2023-01-27 DIAGNOSIS — J449 Chronic obstructive pulmonary disease, unspecified: Secondary | ICD-10-CM

## 2023-01-27 DIAGNOSIS — I671 Cerebral aneurysm, nonruptured: Secondary | ICD-10-CM

## 2023-01-27 NOTE — Progress Notes (Signed)
Daily Session Note  Patient Details  Name: Nancy [name] MRN: [id number] Date of Birth: [birth year] Referring Provider:   Flowsheet Row Pulmonary Rehab from 11/18/2022 in [facility] Cardiac and Pulmonary Rehab  Referring Provider [name]       Encounter Date: 01/27/2023  Check In:  Session Check In - 01/27/23 1537       Check-In   Supervising physician immediately available to respond to emergencies See telemetry face sheet for immediately available ER MD    Location [facility]-Cardiac & Pulmonary Rehab    Staff Present Renita Papa, RN BSN;Joseph Tessie Fass, RCP,RRT,BSRT;Noah [place], [place], Exercise Physiologist    Virtual Visit No    Medication changes reported     No    Fall or balance concerns reported    No    Warm-up and Cool-down Performed on first and last piece of equipment    Resistance Training Performed Yes    VAD Patient? No    PAD/SET Patient? No      Pain Assessment   Currently in Pain? No/denies                Social History   Tobacco Use  Smoking Status Former   Packs/day: 0.50   Years: 30.00   Additional pack years: 0.00   Total pack years: 15.00   Types: Cigarettes   Quit date: 03/11/2016   Years since quitting: 6.8  Smokeless Tobacco Never  Tobacco Comments   patient states  that she is working at her own pace to quit smoking    Goals Met:  Independence with exercise equipment Exercise tolerated well No report of concerns or symptoms today Strength training completed today  Goals Unmet:  Not Applicable  Comments: Pt able to follow exercise prescription today without complaint.  Will continue to monitor for progression.    Dr. Emily Filbert is Medical Director for [place].  Dr. Ottie Glazier is Medical Director for [facility] Pulmonary Rehabilitation.

## 2023-01-28 ENCOUNTER — Other Ambulatory Visit: Payer: Self-pay | Admitting: Family Medicine

## 2023-01-28 ENCOUNTER — Ambulatory Visit (INDEPENDENT_AMBULATORY_CARE_PROVIDER_SITE_OTHER): Payer: 59 | Admitting: Family Medicine

## 2023-01-28 ENCOUNTER — Encounter: Payer: Self-pay | Admitting: Family Medicine

## 2023-01-28 VITALS — BP 122/72 | HR 92 | Temp 98.2°F | Resp 20 | Ht 67.0 in | Wt 186.1 lb

## 2023-01-28 DIAGNOSIS — E038 Other specified hypothyroidism: Secondary | ICD-10-CM | POA: Diagnosis not present

## 2023-01-28 DIAGNOSIS — F339 Major depressive disorder, recurrent, unspecified: Secondary | ICD-10-CM | POA: Diagnosis not present

## 2023-01-28 MED ORDER — DULOXETINE HCL 20 MG PO CPEP: 40.0000 mg | ORAL_CAPSULE | Freq: Two times a day (BID) | ORAL | 3 refills | Status: DC

## 2023-01-28 NOTE — Progress Notes (Unsigned)
I,J'ya E Zaylan Kissoon,acting as a scribe for Gwyneth Sprout, FNP.,have documented all relevant documentation on the behalf of Gwyneth Sprout, FNP,as directed by  Gwyneth Sprout, FNP while in the presence of Gwyneth Sprout, FNP.   Established patient visit   Patient: Nancy [name]   DOB: 04-15-1961   62 y.o. Female  MRN: [id number] Visit Date: 01/28/2023  Today's healthcare provider: Gwyneth Sprout, FNP   Chief Complaint  Patient presents with   Depression   Subjective    HPI  Depression, Follow-up  She  was last seen for this 1 years ago. Changes made at last visit include Cymbalta 40 mg daily and Lorazepam 0.5 mg Oral Every 8 hours PRN.   She reports fair compliance with treatment. She is not having side effects.  She reports fair tolerance of treatment. Current symptoms include: depressed mood She feels she is Unchanged since last visit.     01/28/2023    3:23 PM 11/18/2022    5:40 PM 09/21/2021    1:45 PM  Depression screen PHQ 2/9  Decreased Interest 0 1 0  Down, Depressed, Hopeless 0 0 0  PHQ - 2 Score 0 1 0  Altered sleeping 0 0 2  Tired, decreased energy 3 3 3   Change in appetite 0 0 0  Feeling bad or failure about yourself  0 0 0  Trouble concentrating 0 0 0  Moving slowly or fidgety/restless 0 0 0  Suicidal thoughts 0 0 0  PHQ-9 Score 3 4 5   Difficult doing work/chores Not difficult at all  Not difficult at all    -----------------------------------------------------------------------------------------   Medications: Outpatient Medications Prior to Visit  Medication Sig   albuterol (VENTOLIN HFA) 108 (90 Base) MCG/ACT inhaler SMARTSIG:2 Inhalation Via Inhaler Every 6 Hours PRN   amLODipine (NORVASC) 5 MG tablet Take 5 mg by mouth daily.   aspirin EC 81 MG tablet Take by mouth.   budesonide-formoterol (SYMBICORT) 160-4.5 MCG/ACT inhaler Inhale 2 puffs into the lungs daily.    carvedilol (COREG) 25 MG tablet Take 25 mg by mouth 2 (two) times daily.    doxycycline (PERIOSTAT) 20 MG tablet Take 1 tablet (20 mg total) by mouth 2 (two) times daily.   furosemide (LASIX) 40 MG tablet Take 40 mg by mouth daily.   levothyroxine (SYNTHROID) 125 MCG tablet TAKE 1 TABLET BY MOUTH DAILY BEFORE BREAKFAST.   ondansetron (ZOFRAN ODT) 4 MG disintegrating tablet Take 1 tablet (4 mg total) by mouth every 8 (eight) hours as needed for nausea or vomiting.   spironolactone (ALDACTONE) 25 MG tablet Take 25 mg by mouth daily.   topiramate (TOPAMAX) 50 MG tablet Take 50 mg by mouth 2 (two) times daily.   valACYclovir (VALTREX) 1000 MG tablet    warfarin (COUMADIN) 2.5 MG tablet Take 5 mg by mouth daily.   [DISCONTINUED] DULoxetine (CYMBALTA) 20 MG capsule Take 2 capsules (40 mg total) by mouth daily. Last refill until seen.   Fluticasone-Umeclidin-Vilant 100-62.5-25 MCG/ACT AEPB SMARTSIG:1 Inhalation Via Inhaler Daily   LORazepam (ATIVAN) 0.5 MG tablet Take 1 tablet (0.5 mg total) by mouth every 8 (eight) hours as needed (nausea, recommend 6am, 2pm, 10pm dosing).   Facility-Administered Medications Prior to Visit  Medication Dose Route Frequency Provider   ipratropium-albuterol (DUONEB) 0.5-2.5 (3) MG/3ML nebulizer solution 3 mL  3 mL Nebulization Once [place], Adriana M, PA-C    Review of Systems    Objective    BP 122/72 (BP Location:  Right Arm, Patient Position: Sitting, Cuff Size: Large)   Pulse 92   Temp 98.2 F (36.8 C) (Oral)   Resp 20   Ht 5\' 7"  (1.702 m)   Wt 186 lb 1.6 oz (84.4 kg)   SpO2 100%   BMI 29.15 kg/m   Physical Exam Vitals and nursing note reviewed.  Constitutional:      General: She is not in acute distress.    Appearance: Normal appearance. She is obese. She is not ill-appearing, toxic-appearing or diaphoretic.  HENT:     Head: Normocephalic and atraumatic.  Cardiovascular:     Rate and Rhythm: Normal rate and regular rhythm.     Pulses: Normal pulses.     Heart sounds: Normal heart sounds. No murmur heard.    No friction  rub. No gallop.  Pulmonary:     Effort: Pulmonary effort is normal. No respiratory distress.     Breath sounds: Normal breath sounds. No stridor. No wheezing, rhonchi or rales.  Chest:     Chest wall: No tenderness.  Musculoskeletal:        General: No swelling, tenderness, deformity or signs of injury. Normal range of motion.     Right lower leg: No edema.     Left lower leg: No edema.  Skin:    General: Skin is warm and dry.     Capillary Refill: Capillary refill takes less than 2 seconds.     Coloration: Skin is not jaundiced or pale.     Findings: No bruising, erythema, lesion or rash.  Neurological:     General: No focal deficit present.     Mental Status: She is alert and oriented to person, place, and time. Mental status is at baseline.     Cranial Nerves: No cranial nerve deficit.     Sensory: No sensory deficit.     Motor: No weakness.     Coordination: Coordination normal.  Psychiatric:        Mood and Affect: Mood normal. Affect is flat.        Behavior: Behavior normal.        Thought Content: Thought content normal.        Judgment: Judgment normal.     Results for orders placed or performed in visit on 01/28/23  TSH + free T4  Result Value Ref Range   TSH 1.570 0.450 - 4.500 uIU/mL   Free T4 1.83 (H) 0.82 - 1.77 ng/dL    Assessment & Plan     Problem List Items Addressed This Visit       Endocrine   Other specified hypothyroidism    Chronic, previously stable Remains on synthroid at 125 mg Denies complaints or side effects      Relevant Orders   TSH + free T4     Other   Depression, recurrent ([place]) - Primary    Chronic, stable PHQ reviewed Continue cymbalta 40 mg daily      Relevant Medications   DULoxetine (CYMBALTA) 20 MG capsule   No follow-ups on file.     Vonna Kotyk, FNP, have reviewed all documentation for this visit. The documentation on 01/29/23 for the exam, diagnosis, procedures, and orders are all accurate and  complete.  Gwyneth Sprout, [place] [phone] (phone) [phone] (fax)  [place]

## 2023-01-29 ENCOUNTER — Encounter: Payer: Self-pay | Admitting: *Deleted

## 2023-01-29 ENCOUNTER — Encounter: Payer: 59 | Admitting: *Deleted

## 2023-01-29 ENCOUNTER — Other Ambulatory Visit: Payer: 59

## 2023-01-29 DIAGNOSIS — J449 Chronic obstructive pulmonary disease, unspecified: Secondary | ICD-10-CM

## 2023-01-29 DIAGNOSIS — E038 Other specified hypothyroidism: Secondary | ICD-10-CM | POA: Insufficient documentation

## 2023-01-29 LAB — TSH+FREE T4
Free T4: 1.83 ng/dL — ABNORMAL HIGH (ref 0.82–1.77)
TSH: 1.57 u[IU]/mL (ref 0.450–4.500)

## 2023-01-29 NOTE — Progress Notes (Signed)
Daily Session Note  Patient Details  Name: Nancy [name] MRN: [id number] Date of Birth: January 18, 1961 Referring Provider:   Flowsheet Row Pulmonary Rehab from 11/18/2022 in [facility]  Referring Provider [name]       Encounter Date: 01/29/2023  Check In:  Session Check In - 01/29/23 1529       Check-In   Supervising physician immediately available to respond to emergencies See telemetry face sheet for immediately available ER MD    Location [facility]-Cardiac & Pulmonary Rehab    Staff Present Renita Papa, RN BSN;Megan Tamala Julian, RN, Terie Purser, RCP,RRT,BSRT    Virtual Visit No    Medication changes reported     Yes    Comments added metoprolol    Fall or balance concerns reported    No    Warm-up and Cool-down Performed on first and last piece of equipment    Resistance Training Performed Yes    VAD Patient? No    PAD/SET Patient? No      Pain Assessment   Currently in Pain? No/denies                Social History   Tobacco Use  Smoking Status Former   Packs/day: 0.50   Years: 30.00   Additional pack years: 0.00   Total pack years: 15.00   Types: Cigarettes   Quit date: 03/11/2016   Years since quitting: 6.8  Smokeless Tobacco Never  Tobacco Comments   patient states  that she is working at her own pace to quit smoking    Goals Met:  Independence with exercise equipment Exercise tolerated well No report of concerns or symptoms today Strength training completed today  Goals Unmet:  Not Applicable  Comments: Pt able to follow exercise prescription today without complaint.  Will continue to monitor for progression.    Dr. Emily Filbert is Medical Director for [place].  Dr. Ottie Glazier is Medical Director for [facility] Pulmonary Rehabilitation.

## 2023-01-29 NOTE — Assessment & Plan Note (Signed)
Chronic, previously stable Remains on synthroid at 125 mg Denies complaints or side effects

## 2023-01-29 NOTE — Progress Notes (Signed)
Pulmonary Individual Treatment Plan  Patient Details  Name: Nancy [name] MRN: [id number] Date of Birth: 1961-08-21 Referring Provider:   Flowsheet Row Pulmonary Rehab from 11/18/2022 in [facility] Cardiac and Pulmonary Rehab  Referring Provider [name]       Initial Encounter Date:  Flowsheet Row Pulmonary Rehab from 11/18/2022 in [facility] Cardiac and Pulmonary Rehab  Date 11/18/22       Visit Diagnosis: Stage 3 severe COPD by GOLD classification ([place])  Patient's Home Medications on Admission:  Current Outpatient Medications:    albuterol (VENTOLIN HFA) 108 (90 Base) MCG/ACT inhaler, SMARTSIG:2 Inhalation Via Inhaler Every 6 Hours PRN, Disp: , Rfl:    amLODipine (NORVASC) 5 MG tablet, Take 5 mg by mouth daily., Disp: , Rfl:    aspirin EC 81 MG tablet, Take by mouth., Disp: , Rfl:    budesonide-formoterol (SYMBICORT) 160-4.5 MCG/ACT inhaler, Inhale 2 puffs into the lungs daily. , Disp: , Rfl:    carvedilol (COREG) 25 MG tablet, Take 25 mg by mouth 2 (two) times daily., Disp: , Rfl:    doxycycline (PERIOSTAT) 20 MG tablet, Take 1 tablet (20 mg total) by mouth 2 (two) times daily., Disp: 60 tablet, Rfl: 6   DULoxetine (CYMBALTA) 20 MG capsule, Take 2 capsules (40 mg total) by mouth 2 (two) times daily., Disp: 360 capsule, Rfl: 3   Fluticasone-Umeclidin-Vilant 100-62.5-25 MCG/ACT AEPB, SMARTSIG:1 Inhalation Via Inhaler Daily, Disp: , Rfl:    furosemide (LASIX) 40 MG tablet, Take 40 mg by mouth daily., Disp: , Rfl:    levothyroxine (SYNTHROID) 125 MCG tablet, TAKE 1 TABLET BY MOUTH DAILY BEFORE BREAKFAST., Disp: 30 tablet, Rfl: 0   LORazepam (ATIVAN) 0.5 MG tablet, Take 1 tablet (0.5 mg total) by mouth every 8 (eight) hours as needed (nausea, recommend 6am, 2pm, 10pm dosing)., Disp: 90 tablet, Rfl: 0   ondansetron (ZOFRAN ODT) 4 MG disintegrating tablet, Take 1 tablet (4 mg total) by mouth every 8 (eight) hours as needed for nausea or vomiting., Disp: 20 tablet, Rfl: 2   spironolactone (ALDACTONE) 25  MG tablet, Take 25 mg by mouth daily., Disp: , Rfl:    topiramate (TOPAMAX) 50 MG tablet, Take 50 mg by mouth 2 (two) times daily., Disp: , Rfl:    valACYclovir (VALTREX) 1000 MG tablet, , Disp: , Rfl:    warfarin (COUMADIN) 2.5 MG tablet, Take 5 mg by mouth daily., Disp: , Rfl:   Current Facility-Administered Medications:    ipratropium-albuterol (DUONEB) 0.5-2.5 (3) MG/3ML nebulizer solution 3 mL, 3 mL, Nebulization, Once, Pollak, Adriana M, PA-C  Past Medical History: Past Medical History:  Diagnosis Date   Aortic dissection ([place]) 2010   COPD (chronic obstructive pulmonary disease) ([place])    Depression    Hypertension    Hypothyroidism    Pneumonia    PONV (postoperative nausea and vomiting)    Rosacea    Seasonal allergies    Sleep apnea    Does not use CPAP    Tobacco Use: Social History   Tobacco Use  Smoking Status Former   Packs/day: 0.50   Years: 30.00   Additional pack years: 0.00   Total pack years: 15.00   Types: Cigarettes   Quit date: 03/11/2016   Years since quitting: 6.8  Smokeless Tobacco Never  Tobacco Comments   patient states  that she is working at her own pace to quit smoking    Labs: Review Flowsheet       Latest Ref Rng & Units 07/02/2009 08/29/2015 01/05/2018 09/29/2020  Labs for ITP Cardiac and Pulmonary Rehab  Cholestrol 100 - 199 mg/dL - 176  185  167   LDL (calc) 0 - 99 mg/dL - 103  111  101   HDL-C >39 mg/dL - 53  50  45   Trlycerides 0 - 149 mg/dL - 102  119  116   TCO2 0 - 100 mmol/L 22  - - -     Pulmonary Assessment Scores:  Pulmonary Assessment Scores     Row Name 11/18/22 1739         ADL UCSD   SOB Score total 36     Rest 0     Walk 4     Stairs 4     Bath 3     Dress 1     Shop 3       CAT Score   CAT Score 16       mMRC Score   mMRC Score 2.5              UCSD: Self-administered rating of dyspnea associated with activities of daily living (ADLs) 6-point scale (0 = "not at all" to 5 = "maximal or  unable to do because of breathlessness")  Scoring Scores range from 0 to 120.  Minimally important difference is 5 units  CAT: CAT can identify the health impairment of COPD patients and is better correlated with disease progression.  CAT has a scoring range of zero to 40. The CAT score is classified into four groups of low (less than 10), medium (10 - 20), high (21-30) and very high (31-40) based on the impact level of disease on health status. A CAT score over 10 suggests significant symptoms.  A worsening CAT score could be explained by an exacerbation, poor medication adherence, poor inhaler technique, or progression of COPD or comorbid conditions.  CAT MCID is 2 points  mMRC: mMRC (Modified Medical Research Council) Dyspnea Scale is used to assess the degree of baseline functional disability in patients of respiratory disease due to dyspnea. No minimal important difference is established. A decrease in score of 1 point or greater is considered a positive change.   Pulmonary Function Assessment:  Pulmonary Function Assessment - 11/18/22 1734       Initial Spirometry Results   FVC% 72 %    FEV1% 34 %    FEV1/FVC Ratio 38      Post Bronchodilator Spirometry Results   FVC% 79 %    FEV1% 37 %    FEV1/FVC Ratio 38             Exercise Target Goals: Exercise Program Goal: Individual exercise prescription set using results from initial 6 min walk test and THRR while considering  patient's activity barriers and safety.   Exercise Prescription Goal: Initial exercise prescription builds to 30-45 minutes a day of aerobic activity, 2-3 days per week.  Home exercise guidelines will be given to patient during program as part of exercise prescription that the participant will acknowledge.  Education: Aerobic Exercise: - Group verbal and visual presentation on the components of exercise prescription. Introduces F.I.T.T principle from ACSM for exercise prescriptions.  Reviews F.I.T.T.  principles of aerobic exercise including progression. Written material given at graduation. Flowsheet Row Pulmonary Rehab from 01/22/2023 in [facility] Cardiac and Pulmonary Rehab  Date 11/27/22  Educator KW  Instruction Review Code 1- Verbalizes Understanding       Education: Resistance Exercise: - Group verbal and visual presentation on the components of  exercise prescription. Introduces F.I.T.T principle from ACSM for exercise prescriptions  Reviews F.I.T.T. principles of resistance exercise including progression. Written material given at graduation. Flowsheet Row Pulmonary Rehab from 01/22/2023 in [facility] Cardiac and Pulmonary Rehab  Date 12/04/22  Educator KW  Instruction Review Code 1- [employer] Understanding        Education: Exercise & Equipment Safety: - Individual verbal instruction and demonstration of equipment use and safety with use of the equipment. Flowsheet Row Pulmonary Rehab from 01/22/2023 in [facility] Cardiac and Pulmonary Rehab  Date 11/18/22  Educator [facility]  Instruction Review Code 1- Verbalizes Understanding       Education: Exercise Physiology & General Exercise Guidelines: - Group verbal and written instruction with models to review the exercise physiology of the cardiovascular system and associated critical values. Provides general exercise guidelines with specific guidelines to those with heart or lung disease.    Education: Flexibility, Balance, Mind/Body Relaxation: - Group verbal and visual presentation with interactive activity on the components of exercise prescription. Introduces F.I.T.T principle from ACSM for exercise prescriptions. Reviews F.I.T.T. principles of flexibility and balance exercise training including progression. Also discusses the mind body connection.  Reviews various relaxation techniques to help reduce and manage stress (i.e. Deep breathing, progressive muscle relaxation, and visualization). Balance handout provided to take home. Written material  given at graduation. Flowsheet Row Pulmonary Rehab from 01/22/2023 in [facility] Cardiac and Pulmonary Rehab  Date 12/11/22  Educator KW  Instruction Review Code 1- Verbalizes Understanding       Activity Barriers & Risk Stratification:  Activity Barriers & Cardiac Risk Stratification - 11/18/22 1716       Activity Barriers & Cardiac Risk Stratification   Activity Barriers None             6 Minute Walk:  6 Minute Walk     Row Name 11/18/22 1708         6 Minute Walk   Phase Initial     Distance 580 feet     Walk Time 4.5 minutes     # of Rest Breaks 4     MPH 1.46     METS 2.3     RPE 14     Perceived Dyspnea  3     VO2 Peak 8.04     Symptoms Yes (comment)  SOB     Resting HR 89 bpm     Resting BP 138/84     Resting Oxygen Saturation  93 %     Exercise Oxygen Saturation  during 6 min walk 94 %     Max Ex. HR 104 bpm     Max Ex. BP 146/72     2 Minute Post BP 122/60       Interval HR   1 Minute HR 99     2 Minute HR 104     3 Minute HR 95     4 Minute HR 95     5 Minute HR 95     6 Minute HR 98     2 Minute Post HR 98     Interval Heart Rate? Yes       Interval Oxygen   Interval Oxygen? Yes     Baseline Oxygen Saturation % 93 %     1 Minute Oxygen Saturation % 99 %     1 Minute Liters of Oxygen 2 L     2 Minute Oxygen Saturation % 104 %     2 Minute Liters of  Oxygen 2 L     3 Minute Oxygen Saturation % 95 %     3 Minute Liters of Oxygen 2 L     4 Minute Oxygen Saturation % 95 %     4 Minute Liters of Oxygen 2 L     5 Minute Oxygen Saturation % 95 %     5 Minute Liters of Oxygen 2 L     6 Minute Oxygen Saturation % 98 %     6 Minute Liters of Oxygen 2 L     2 Minute Post Oxygen Saturation % 98 %     2 Minute Post Liters of Oxygen 2 L             Oxygen Initial Assessment:  Oxygen Initial Assessment - 11/18/22 1732       Home Oxygen   Home Oxygen Device Portable Concentrator    Sleep Oxygen Prescription Continuous    Home Exercise Oxygen  Prescription --    Liters per minute 2    Home Resting Oxygen Prescription --    Liters per minute 2    Compliance with Home Oxygen Use No      Initial 6 min Walk   Oxygen Used Continuous    Liters per minute 2      Program Oxygen Prescription   Program Oxygen Prescription Continuous    Liters per minute 2      Intervention   Short Term Goals To learn and understand importance of monitoring SPO2 with pulse oximeter and demonstrate accurate use of the pulse oximeter.;To learn and understand importance of maintaining oxygen saturations>88%;To learn and demonstrate proper pursed lip breathing techniques or other breathing techniques. ;To learn and demonstrate proper use of respiratory medications    Long  Term Goals Verbalizes importance of monitoring SPO2 with pulse oximeter and return demonstration;Maintenance of O2 saturations>88%;Exhibits proper breathing techniques, such as pursed lip breathing or other method taught during program session;Compliance with respiratory medication             Oxygen Re-Evaluation:  Oxygen Re-Evaluation     Row Name 11/21/22 1608 12/12/22 1208 01/20/23 1602         Program Oxygen Prescription   Program Oxygen Prescription -- Continuous Continuous     Liters per minute -- 2 2       Home Oxygen   Home Oxygen Device -- Portable Concentrator Portable Concentrator     Sleep Oxygen Prescription -- Continuous Continuous     Liters per minute -- 2 2     Home Exercise Oxygen Prescription -- Continuous Continuous     Liters per minute -- 2 2     Home Resting Oxygen Prescription -- Continuous None     Liters per minute -- 2 --     Compliance with Home Oxygen Use -- No Yes       Goals/Expected Outcomes   Short Term Goals To learn and understand importance of monitoring SPO2 with pulse oximeter and demonstrate accurate use of the pulse oximeter.;To learn and understand importance of maintaining oxygen saturations>88%;To learn and demonstrate proper  pursed lip breathing techniques or other breathing techniques. ;To learn and demonstrate proper use of respiratory medications To learn and demonstrate proper pursed lip breathing techniques or other breathing techniques.  To learn and demonstrate proper pursed lip breathing techniques or other breathing techniques. ;To learn and understand importance of monitoring SPO2 with pulse oximeter and demonstrate accurate use of the pulse oximeter.;To learn and understand  importance of maintaining oxygen saturations>88%;To learn and demonstrate proper use of respiratory medications;To learn and exhibit compliance with exercise, home and travel O2 prescription     Long  Term Goals Verbalizes importance of monitoring SPO2 with pulse oximeter and return demonstration;Maintenance of O2 saturations>88%;Exhibits proper breathing techniques, such as pursed lip breathing or other method taught during program session;Compliance with respiratory medication Exhibits proper breathing techniques, such as pursed lip breathing or other method taught during program session Exhibits proper breathing techniques, such as pursed lip breathing or other method taught during program session;Maintenance of O2 saturations>88%;Verbalizes importance of monitoring SPO2 with pulse oximeter and return demonstration;Exhibits compliance with exercise, home  and travel O2 prescription;Compliance with respiratory medication;Demonstrates proper use of MDI's     Comments Reviewed PLB technique with pt.  Talked about how it works and it's importance in maintaining their exercise saturations. Diaphragmatic and PLB breathing explained and performed with patient. Patient has a better understanding of how to do these exercises to help with breathing performance and relaxation. Patient performed breathing techniques adequately and to practice further at home. Nell is doing well with using her PLB. She is good about keeping an eye on her saturations.  She will use  her oxygen every night like she is supposed to and during the day for activity.  She is good when she resting.  She feels that she is doing well with where she is with her breathing.     Goals/Expected Outcomes Short: Become more profiecient at using PLB.   Long: Become independent at using PLB. Short: practice PLB and diaphragmatic breathing at home. Long: Use PLB and diaphragmatic breathing independently post [employer]. Short: Continue to use her PLB to manage breathing Long: continued compliance              Oxygen Discharge (Final Oxygen Re-Evaluation):  Oxygen Re-Evaluation - 01/20/23 1602       Program Oxygen Prescription   Program Oxygen Prescription Continuous    Liters per minute 2      Home Oxygen   Home Oxygen Device Portable Concentrator    Sleep Oxygen Prescription Continuous    Liters per minute 2    Home Exercise Oxygen Prescription Continuous    Liters per minute 2    Home Resting Oxygen Prescription None    Compliance with Home Oxygen Use Yes      Goals/Expected Outcomes   Short Term Goals To learn and demonstrate proper pursed lip breathing techniques or other breathing techniques. ;To learn and understand importance of monitoring SPO2 with pulse oximeter and demonstrate accurate use of the pulse oximeter.;To learn and understand importance of maintaining oxygen saturations>88%;To learn and demonstrate proper use of respiratory medications;To learn and exhibit compliance with exercise, home and travel O2 prescription    Long  Term Goals Exhibits proper breathing techniques, such as pursed lip breathing or other method taught during program session;Maintenance of O2 saturations>88%;Verbalizes importance of monitoring SPO2 with pulse oximeter and return demonstration;Exhibits compliance with exercise, home  and travel O2 prescription;Compliance with respiratory medication;Demonstrates proper use of MDI's    Comments Karri is doing well with using her PLB. She is good about  keeping an eye on her saturations.  She will use her oxygen every night like she is supposed to and during the day for activity.  She is good when she resting.  She feels that she is doing well with where she is with her breathing.    Goals/Expected Outcomes Short: Continue to use her  PLB to manage breathing Long: continued compliance             Initial Exercise Prescription:  Initial Exercise Prescription - 11/18/22 1700       Date of Initial Exercise RX and Referring Provider   Date 11/18/22    Referring Provider [name]      Oxygen   Oxygen Continuous    Liters 2    Maintain Oxygen Saturation 88% or higher      Treadmill   MPH 0.8    Grade 0    Minutes 15    METs 1      Recumbant Bike   Level 1    RPM 50    Minutes 15    METs 1.46      NuStep   Level 1    SPM 80    Minutes 15    METs 1.46      REL-XR   Level 1    Speed 50    Minutes 15    METs 1.46      T5 Nustep   Level 1    SPM 80    Minutes 15    METs 2.3      Track   Laps 10    Minutes 15    METs 1.54      Prescription Details   Frequency (times per week) 3    Duration Progress to 30 minutes of continuous aerobic without signs/symptoms of physical distress      Intensity   THRR 40-80% of Max Heartrate 117-146    Perceived Dyspnea 0-4      Progression   Progression Continue to progress workloads to maintain intensity without signs/symptoms of physical distress.      Resistance Training   Training Prescription Yes    Weight 2    Reps 10-15             Perform Capillary Blood Glucose checks as needed.  Exercise Prescription Changes:   Exercise Prescription Changes     Row Name 11/18/22 1700 11/25/22 1100 12/10/22 1300 12/23/22 1200 01/06/23 1400     Response to Exercise   Blood Pressure (Admit) 138/84 116/58 118/60 118/60 104/60   Blood Pressure (Exercise) 146/72 128/64 144/80 -- --   Blood Pressure (Exit) 122/60 110/58 110/60 120/62 110/58   Heart Rate (Admit) 89 bpm 89  bpm 84 bpm 80 bpm 76 bpm   Heart Rate (Exercise) 104 bpm 98 bpm 96 bpm 100 bpm 100 bpm   Heart Rate (Exit) 98 bpm 85 bpm 89 bpm 93 bpm 94 bpm   Oxygen Saturation (Admit) 93 % 95 % 98 % 98 % 98 %   Oxygen Saturation (Exercise) 94 % 89 % 92 % 94 % 88 %   Oxygen Saturation (Exit) 98 % 94 % 96 % 98 % 97 %   Rating of Perceived Exertion (Exercise) 14 13 15 13 12    Perceived Dyspnea (Exercise) 3 3 3 2 3    Symptoms SOB SOB SOB SOB SOB   Comments 6 MWT results 1st full day of exercise -- -- --   Duration -- Progress to 30 minutes of  aerobic without signs/symptoms of physical distress Progress to 30 minutes of  aerobic without signs/symptoms of physical distress Continue with 30 min of aerobic exercise without signs/symptoms of physical distress. Continue with 30 min of aerobic exercise without signs/symptoms of physical distress.   Intensity -- THRR unchanged THRR unchanged THRR unchanged THRR unchanged  Progression   Progression -- Continue to progress workloads to maintain intensity without signs/symptoms of physical distress. Continue to progress workloads to maintain intensity without signs/symptoms of physical distress. Continue to progress workloads to maintain intensity without signs/symptoms of physical distress. Continue to progress workloads to maintain intensity without signs/symptoms of physical distress.   Average METs -- 1.69 2.12 2.43 2.29     Resistance Training   Training Prescription -- Yes Yes Yes Yes   Weight -- 2 lb 2 lb 2 lb 2 lb   Reps -- 10-15 10-15 10-15 10-15     Interval Training   Interval Training -- No No No No     Oxygen   Oxygen -- Continuous Continuous Continuous Continuous   Liters -- 2 2 2 2      Treadmill   MPH -- 0.9 1.2 1.8 1.8   Grade -- 0 0 0.5 0.5   Minutes -- 15 15 15 15    METs -- 1.7 1.92 2.5 2.5     Recumbant Bike   Level -- -- 2 3 3    Watts -- -- -- 22 25   Minutes -- -- 15 15 15    METs -- -- 2.91 2.88 --     NuStep   Level -- 2 3 -- 3    Minutes -- 15 15 15 15    METs -- 1.7 2.4 2.3 2.7     REL-XR   Level -- -- -- 1 3   Minutes -- -- -- 15 15   METs -- -- -- 2.9 --     T5 Nustep   Level -- -- 2 2 2    Minutes -- -- 15 15 15    METs -- -- 1.8 1.8 1.8     Oxygen   Maintain Oxygen Saturation -- -- 88% or higher 88% or higher 88% or higher    Row Name 01/15/23 1500 01/20/23 1100 01/20/23 1200         Response to Exercise   Blood Pressure (Admit) -- 140/70 --     Blood Pressure (Exercise) -- 136/72 --     Blood Pressure (Exit) -- 112/62 --     Heart Rate (Admit) -- 80 bpm --     Heart Rate (Exercise) -- 108 bpm --     Heart Rate (Exit) -- 101 bpm --     Oxygen Saturation (Admit) -- 96 % --     Oxygen Saturation (Exercise) -- 94 % --     Oxygen Saturation (Exit) -- 96 % --     Rating of Perceived Exertion (Exercise) -- 13 --     Perceived Dyspnea (Exercise) -- 3 --     Symptoms -- SOB --     Comments -- return from surgery --     Duration -- Continue with 30 min of aerobic exercise without signs/symptoms of physical distress. --     Intensity -- THRR unchanged --       Progression   Progression -- Continue to progress workloads to maintain intensity without signs/symptoms of physical distress. --     Average METs -- 2.1 --       Resistance Training   Training Prescription -- Yes --     Weight -- 2 lb --     Reps -- 10-15 --       Interval Training   Interval Training -- No --       Oxygen   Oxygen -- Continuous --     Liters --  2 --       Treadmill   MPH -- -- 1.4     Grade -- -- 0.5     Minutes -- -- 15     METs -- -- 2.17       Recumbant Bike   Level -- -- 3     Watts -- -- 13     Minutes -- -- 15     METs -- -- 2.45       T5 Nustep   Level -- -- 2     Minutes -- -- 15     METs -- -- 1.8       Home Exercise Plan   Plans to continue exercise at Home (comment)  Walking at home, 2 lb hand weights -- Home (comment)  Walking at home, 2 lb hand weights     Frequency Add 2 additional days  to program exercise sessions. -- Add 2 additional days to program exercise sessions.     Initial Home Exercises Provided 01/15/23 -- 01/15/23       Oxygen   Maintain Oxygen Saturation 88% or higher -- 88% or higher              Exercise Comments:   Exercise Comments     Row Name 11/21/22 1606           Exercise Comments First full day of exercise!  Patient was oriented to gym and equipment including functions, settings, policies, and procedures.  Patient's individual exercise prescription and treatment plan were reviewed.  All starting workloads were established based on the results of the 6 minute walk test done at initial orientation visit.  The plan for exercise progression was also introduced and progression will be customized based on patient's performance and goals.                Exercise Goals and Review:   Exercise Goals     Row Name 11/18/22 1722             Exercise Goals   Increase Physical Activity Yes       Intervention Provide advice, education, support and counseling about physical activity/exercise needs.;Develop an individualized exercise prescription for aerobic and resistive training based on initial evaluation findings, risk stratification, comorbidities and participant's personal goals.       Expected Outcomes Short Term: Attend rehab on a regular basis to increase amount of physical activity.;Long Term: Add in home exercise to make exercise part of routine and to increase amount of physical activity.;Long Term: Exercising regularly at least 3-5 days a week.       Increase Strength and Stamina Yes       Intervention Provide advice, education, support and counseling about physical activity/exercise needs.;Develop an individualized exercise prescription for aerobic and resistive training based on initial evaluation findings, risk stratification, comorbidities and participant's personal goals.       Expected Outcomes Short Term: Increase workloads from  initial exercise prescription for resistance, speed, and METs.;Short Term: Perform resistance training exercises routinely during rehab and add in resistance training at home;Long Term: Improve cardiorespiratory fitness, muscular endurance and strength as measured by increased METs and functional capacity (6MWT)       Able to understand and use rate of perceived exertion (RPE) scale Yes       Intervention Provide education and explanation on how to use RPE scale       Expected Outcomes Short Term: Able to use RPE daily in rehab to  express subjective intensity level;Long Term:  Able to use RPE to guide intensity level when exercising independently       Able to understand and use Dyspnea scale Yes       Intervention Provide education and explanation on how to use Dyspnea scale       Expected Outcomes Short Term: Able to use Dyspnea scale daily in rehab to express subjective sense of shortness of breath during exertion;Long Term: Able to use Dyspnea scale to guide intensity level when exercising independently       Knowledge and understanding of Target Heart Rate Range (THRR) Yes       Intervention Provide education and explanation of THRR including how the numbers were predicted and where they are located for reference       Expected Outcomes Short Term: Able to state/look up THRR;Long Term: Able to use THRR to govern intensity when exercising independently;Short Term: Able to use daily as guideline for intensity in rehab       Able to check pulse independently Yes       Intervention Provide education and demonstration on how to check pulse in carotid and radial arteries.;Review the importance of being able to check your own pulse for safety during independent exercise       Expected Outcomes Short Term: Able to explain why pulse checking is important during independent exercise       Understanding of Exercise Prescription Yes       Intervention Provide education, explanation, and written materials on  patient's individual exercise prescription       Expected Outcomes Short Term: Able to explain program exercise prescription;Long Term: Able to explain home exercise prescription to exercise independently                Exercise Goals Re-Evaluation :  Exercise Goals Re-Evaluation     [place] Name 11/21/22 1607 11/25/22 1123 12/10/22 1400 12/23/22 1211 01/06/23 1419     Exercise Goal Re-Evaluation   Exercise Goals Review Increase Physical Activity;Able to understand and use rate of perceived exertion (RPE) scale;Knowledge and understanding of Target Heart Rate Range (THRR);Understanding of Exercise Prescription;Increase Strength and Stamina;Able to check pulse independently;Able to understand and use Dyspnea scale Increase Physical Activity;Increase Strength and Stamina;Understanding of Exercise Prescription Increase Physical Activity;Increase Strength and Stamina;Understanding of Exercise Prescription Increase Physical Activity;Increase Strength and Stamina;Understanding of Exercise Prescription Increase Physical Activity;Increase Strength and Stamina;Understanding of Exercise Prescription   Comments Reviewed RPE scale, THR and program prescription with pt today.  Pt voiced understanding and was given a copy of goals to take home. Mette is off to a good start with rehab for her first session. She was able to work at her initial exercise prescription and even went up to level 2 on the T4 Nustep.  RPEs were appropriate. We will continue to monitor as she progresses in the program. Journe is doing well in the program. She increased her overall average MET level to 2.12 METs. She also improved to level 2 on the T5 Nustep and level 3 on the T4 Nustep. She also increased her walking speed on the treadmill to 1.2 mph. We will continue to monitor her progress in the program. Edid continues to do well in rehab. She increased her treadmill speed to 1.8 mph and added a 0.5% incline. She also increased to level 3 on  the recumbent bike and worked up to 22 watts! Her oxygen saturation is staying above 88%. We will continue to monitor her progression.  Kandance is doing well in rehab. She has continued to walk on the treadmill at a speed of 1.8 mph and an incline of 0.5%. However, she has had to pause while walking due to SOB. She also improved to level 3 on the XR. She has continued to work at a MET level above 2 METs as well. We will continue to monitor her progress in the program.   Expected Outcomes Short: Use RPE daily to regulate intensity.  Long: Follow program prescription in THR. Short: Continue to follow exercise prescription Long: Progressively increase overall MET level and stamina Short: Continue to increase treadmill workload. Long: Continue to improve strength and stamina. Short: Continue to increase treadmill speed Long: Continue to increase overall MET level and stamina Short: Continue to progressively increase treadmill workload. Long: Continue to improve strength and stamina.    [place] Name 01/15/23 1550 01/20/23 1202 01/20/23 1552         Exercise Goal Re-Evaluation   Exercise Goals Review Increase Physical Activity;Able to understand and use Dyspnea scale;Understanding of Exercise Prescription;Increase Strength and Stamina;Knowledge and understanding of Target Heart Rate Range (THRR);Able to check pulse independently;Able to understand and use rate of perceived exertion (RPE) scale Increase Physical Activity;Increase Strength and Stamina;Understanding of Exercise Prescription Increase Physical Activity;Increase Strength and Stamina;Understanding of Exercise Prescription     Comments Reviewed home exercise with pt today.  Pt plans to walk at home for exercise and use 2 lb hand weights for resistance training.  Reviewed THR, pulse, RPE, sign and symptoms, pulse oximetery and when to call 911 or MD.  Also discussed weather considerations and indoor options.  Pt voiced understanding. Gaylia was out for surgery but  returned back for a couple sessions. She is easing back into exercise and will encourage her to increase her workloads once she is back into rhythm. Her oxygen saturations are staying above 88% and we hope to see her watts on the recumbent bike increase. Will continue to monitor. Tayra is doing well.  She is feel better and recovering from surgery.  She is staying active again.  She is using hand weights and walking at home on her off days.  She was feelng better with her stamina prior to surgery but now feels like she is restarting again.     Expected Outcomes Short: Continue to walk at home on days away from rehab. Long: Continue to work towards independence with exercise. Short: Increase watts on recumbant bike LonG: Continue to increase overall MET level and stamina Short; Get back to routine exercise again Long: Conitnue to rebuild stamina              Discharge Exercise Prescription (Final Exercise Prescription Changes):  Exercise Prescription Changes - 01/20/23 1200       Treadmill   MPH 1.4    Grade 0.5    Minutes 15    METs 2.17      Recumbant Bike   Level 3    Watts 13    Minutes 15    METs 2.45      T5 Nustep   Level 2    Minutes 15    METs 1.8      Home Exercise Plan   Plans to continue exercise at Home (comment)   Walking at home, 2 lb hand weights   Frequency Add 2 additional days to program exercise sessions.    Initial Home Exercises Provided 01/15/23      Oxygen   Maintain Oxygen Saturation 88%  or higher             Nutrition:  Target Goals: Understanding of nutrition guidelines, daily intake of sodium 1500mg , cholesterol 200mg , calories 30% from fat and 7% or less from saturated fats, daily to have 5 or more servings of fruits and vegetables.  Education: All About Nutrition: -Group instruction provided by verbal, written material, interactive activities, discussions, models, and posters to present general guidelines for heart healthy nutrition  including fat, fiber, MyPlate, the role of sodium in heart healthy nutrition, utilization of the nutrition label, and utilization of this knowledge for meal planning. Follow up email sent as well. Written material given at graduation. Flowsheet Row Pulmonary Rehab from 01/22/2023 in [facility] Cardiac and Pulmonary Rehab  Education need identified 11/18/22       Biometrics:  Pre Biometrics - 11/18/22 1722       Pre Biometrics   Height 5' 7.5" (1.715 m)    Weight 190 lb 4.8 oz (86.3 kg)    Waist Circumference 40 inches    Hip Circumference 44 inches    Waist to Hip Ratio 0.91 %    BMI (Calculated) 29.35    Single Leg Stand 2.66 seconds              Nutrition Therapy Plan and Nutrition Goals:  Nutrition Therapy & Goals - 11/18/22 1723       Intervention Plan   Intervention Prescribe, educate and counsel regarding individualized specific dietary modifications aiming towards targeted core components such as weight, hypertension, lipid management, diabetes, heart failure and other comorbidities.    Expected Outcomes Short Term Goal: Understand basic principles of dietary content, such as calories, fat, sodium, cholesterol and nutrients.;Short Term Goal: A plan has been developed with personal nutrition goals set during dietitian appointment.;Long Term Goal: Adherence to prescribed nutrition plan.             Nutrition Assessments:  MEDIFICTS Score Key: ?70 Need to make dietary changes  40-70 Heart Healthy Diet ? 40 Therapeutic Level Cholesterol Diet  Flowsheet Row Pulmonary Rehab from 11/18/2022 in [facility] Cardiac and Pulmonary Rehab  Picture Your Plate Total Score on Admission 41      Picture Your Plate Scores: [id number] Unhealthy dietary pattern with much room for improvement. 41-50 Dietary pattern unlikely to meet recommendations for good health and room for improvement. 51-60 More healthful dietary pattern, with some room for improvement.  >60 Healthy dietary pattern, although  there may be some specific behaviors that could be improved.   Nutrition Goals Re-Evaluation:  Nutrition Goals Re-Evaluation     [place] Name 11/28/22 1604 12/12/22 1556 01/20/23 1558         Goals   Current Weight 189 lb (85.7 kg) -- --     Nutrition Goal Manage Portion sizes Reduce diet soda intake Short: reduce soda intake. Long: adhere to a diet that pertains to her needs.     Comment Patient was informed on why it is important to maintain a balanced diet when dealing with Respiratory issues. Explained that it takes a lot of energy to breath and when they are short of breath often they will need to have a good diet to help keep up with the calories they are expending for breathing. Patient states she is drinking more soda that she wants to. She also drinks Mio mixed with water. She wants to try to lose more weight. She does not over eat. [facility] is doing well with her diet.  She is not  gaining weight, so she feels she is doing okay. She is doing okay with her soda intake.  Some days she is able to go without, other days she has two or more.  She is still trying to add in more protein to her diet.  She has a protein shake each day.     Expected Outcome Short: Choose and plan snacks accordingly to patients caloric intake to improve breathing. Long: Maintain a diet independently that meets their caloric intake to aid in daily shortness of breath. Short: reduce soda intake. Long: adhere to a diet that pertains to her needs. Short: Continue to work on soda Long: Conitnue to add in more protein              Nutrition Goals Discharge (Final Nutrition Goals Re-Evaluation):  Nutrition Goals Re-Evaluation - 01/20/23 1558       Goals   Nutrition Goal Short: reduce soda intake. Long: adhere to a diet that pertains to her needs.    Comment Lizamarie is doing well with her diet.  She is not gaining weight, so she feels she is doing okay. She is doing okay with her soda intake.  Some days she is able to go  without, other days she has two or more.  She is still trying to add in more protein to her diet.  She has a protein shake each day.    Expected Outcome Short: Continue to work on soda Long: Conitnue to add in more protein             Psychosocial: Target Goals: Acknowledge presence or absence of significant depression and/or stress, maximize coping skills, provide positive support system. Participant is able to verbalize types and ability to use techniques and skills needed for reducing stress and depression.   Education: Stress, Anxiety, and Depression - Group verbal and visual presentation to define topics covered.  Reviews how body is impacted by stress, anxiety, and depression.  Also discusses healthy ways to reduce stress and to treat/manage anxiety and depression.  Written material given at graduation. Flowsheet Row Pulmonary Rehab from 01/22/2023 in [facility] Cardiac and Pulmonary Rehab  Date 01/22/23  Educator KW  Instruction Review Code 1- [employer] Understanding       Education: Sleep Hygiene -Provides group verbal and written instruction about how sleep can affect your health.  Define sleep hygiene, discuss sleep cycles and impact of sleep habits. Review good sleep hygiene tips.    Initial Review & Psychosocial Screening:  Initial Psych Review & Screening - 11/07/22 1437       Initial Review   Current issues with Current Depression      Family Dynamics   Good Support System? Yes   sister, family     Screening Interventions   Interventions Encouraged to exercise;Provide feedback about the scores to participant    Expected Outcomes Short Term goal: Utilizing psychosocial counselor, staff and physician to assist with identification of specific Stressors or current issues interfering with healing process. Setting desired goal for each stressor or current issue identified.;Long Term Goal: Stressors or current issues are controlled or eliminated.;Short Term goal: Identification  and review with participant of any Quality of Life or Depression concerns found by scoring the questionnaire.;Long Term goal: The participant improves quality of Life and PHQ9 Scores as seen by post scores and/or verbalization of changes             Quality of Life Scores:  Scores of 19 and below usually indicate a  poorer quality of life in these areas.  A difference of  2-3 points is a clinically meaningful difference.  A difference of 2-3 points in the total score of the Quality of Life Index has been associated with significant improvement in overall quality of life, self-image, physical symptoms, and general health in studies assessing change in quality of life.  PHQ-9: Review Flowsheet  More data exists      01/28/2023 11/18/2022 09/21/2021 09/18/2020 12/13/2019  Depression screen PHQ 2/9  Decreased Interest 0 1 0 0 0  Down, Depressed, Hopeless 0 0 0 0 0  PHQ - 2 Score 0 1 0 0 0  Altered sleeping 0 0 2 - 0  Tired, decreased energy 3 3 3  - 0  Change in appetite 0 0 0 - 0  Feeling bad or failure about yourself  0 0 0 - 0  Trouble concentrating 0 0 0 - 0  Moving slowly or fidgety/restless 0 0 0 - 0  Suicidal thoughts 0 0 0 - 0  PHQ-9 Score 3 4 5  - 0  Difficult doing work/chores Not difficult at all - Not difficult at all - Not difficult at all   Interpretation of Total Score  Total Score Depression Severity:  1-4 = Minimal depression, 5-9 = Mild depression, 10-14 = Moderate depression, 15-19 = Moderately severe depression, 20-27 = Severe depression   Psychosocial Evaluation and Intervention:  Psychosocial Evaluation - 11/07/22 1442       Psychosocial Evaluation & Interventions   Interventions Encouraged to exercise with the program and follow exercise prescription    Comments Ashland is coming to pulmonary rehab for COPD. She is on cymbalta and reports it is working well. 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 She recently got a concentrator but it  was unclear over the phone as to her oxygen use. She reports feeling like she is managing her health well independenlty and also has a great support system of family close by.    Expected Outcomes Short: attend pulmonary rehab for education and exercise. Long: develop and maintain positive self care habits.    Continue Psychosocial Services  Follow up required by staff             Psychosocial Re-Evaluation:  Psychosocial Re-Evaluation     [place] Name 11/28/22 1605 12/12/22 1601 01/20/23 1555         Psychosocial Re-Evaluation   Current issues with Current Psychotropic Meds;Current Depression;Current Anxiety/Panic Current Psychotropic Meds;Current Depression;Current Anxiety/Panic Current Psychotropic Meds;Current Depression;Current Anxiety/Panic     Comments Her depression stems from her health. Her balance was off and when she was sitting she would feel like the room was spinning. She would feel anxious last January when all of this started. She has been okay in the last couple of weeks and doing well. She has more procedures coming up and is going to stay on Cymbalta to help her mood. 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 Patient reports no issues with their current mental states, sleep, stress, depression or anxiety. Will follow up with patient in a few weeks for any changes. She says her mood is about the same as it was and takes her Celabrex as prescribed. Lethia is doing well in rehab.  She is feeling pretty good mentally.  She ran out of celebrex for a couple weeks and has just restarted it.  She is feeling okay with it now.  She has not  had any depressions symptoms recently.  She is sleeping pretty well.  She is frustrated that surgery set her back wit her recovery, but she is fighting each day to go forward.     Expected Outcomes Short: continue medication for mood. Long: maintain medciation to keep mood stable. Short: Continue to exercise regularly to support mental  health and notify staff of any changes. Long: maintain mental health and well being through teaching of rehab or prescribed medications independently. SHort: Continue to build stamina again to do more Long: Conitnue to stay positive     Interventions Encouraged to attend Pulmonary Rehabilitation for the exercise Encouraged to attend Pulmonary Rehabilitation for the exercise Encouraged to attend Pulmonary Rehabilitation for the exercise     Continue Psychosocial Services  Follow up required by staff Follow up required by staff Follow up required by staff              Psychosocial Discharge (Final Psychosocial Re-Evaluation):  Psychosocial Re-Evaluation - 01/20/23 1555       Psychosocial Re-Evaluation   Current issues with Current Psychotropic Meds;Current Depression;Current Anxiety/Panic    Comments Rudi is doing well in rehab.  She is feeling pretty good mentally.  She ran out of celebrex for a couple weeks and has just restarted it.  She is feeling okay with it now.  She has not had any depressions symptoms recently.  She is sleeping pretty well.  She is frustrated that surgery set her back wit her recovery, but she is fighting each day to go forward.    Expected Outcomes SHort: Continue to build stamina again to do more Long: Conitnue to stay positive    Interventions Encouraged to attend Pulmonary Rehabilitation for the exercise    Continue Psychosocial Services  Follow up required by staff             Education: Education Goals: Education classes will be provided on a weekly basis, covering required topics. Participant will state understanding/return demonstration of topics presented.  Learning Barriers/Preferences:  Learning Barriers/Preferences - 11/07/22 1453       Learning Barriers/Preferences   Learning Barriers None    Learning Preferences Individual Instruction             General Pulmonary Education Topics:  Infection Prevention: - Provides verbal and  written material to individual with discussion of infection control including proper hand washing and proper equipment cleaning during exercise session. Flowsheet Row Pulmonary Rehab from 01/22/2023 in [facility] Cardiac and Pulmonary Rehab  Date 11/18/22  Educator [facility]  Instruction Review Code 1- Verbalizes Understanding       Falls Prevention: - Provides verbal and written material to individual with discussion of falls prevention and safety. Flowsheet Row Pulmonary Rehab from 01/22/2023 in [facility] Cardiac and Pulmonary Rehab  Date 11/18/22  Educator [facility]  Instruction Review Code 1- Verbalizes Understanding       Chronic Lung Disease Review: - Group verbal instruction with posters, models, PowerPoint presentations and videos,  to review new updates, new respiratory medications, new advancements in procedures and treatments. Providing information on websites and "800" numbers for continued self-education. Includes information about supplement oxygen, available portable oxygen systems, continuous and intermittent flow rates, oxygen safety, concentrators, and Medicare reimbursement for oxygen. Explanation of Pulmonary Drugs, including class, frequency, complications, importance of spacers, rinsing mouth after steroid MDI's, and proper cleaning methods for nebulizers. Review of basic lung anatomy and physiology related to function, structure, and complications of lung disease. Review of risk factors. Discussion about  methods for diagnosing sleep apnea and types of masks and machines for OSA. Includes a review of the use of types of environmental controls: home humidity, furnaces, filters, dust mite/pet prevention, HEPA vacuums. Discussion about weather changes, air quality and the benefits of nasal washing. Instruction on Warning signs, infection symptoms, calling MD promptly, preventive modes, and value of vaccinations. Review of effective airway clearance, coughing and/or vibration techniques. Emphasizing that  all should Create an Action Plan. Written material given at graduation. Flowsheet Row Pulmonary Rehab from 01/22/2023 in [facility] Cardiac and Pulmonary Rehab  Education need identified 11/18/22  Date 01/15/23  Educator [facility]  Instruction Review Code 1- Verbalizes Understanding       AED/CPR: - Group verbal and written instruction with the use of models to demonstrate the basic use of the AED with the basic ABC's of resuscitation.    Anatomy and Cardiac Procedures: - Group verbal and visual presentation and models provide information about basic cardiac anatomy and function. Reviews the testing methods done to diagnose heart disease and the outcomes of the test results. Describes the treatment choices: Medical Management, Angioplasty, or Coronary Bypass Surgery for treating various heart conditions including Myocardial Infarction, Angina, Valve Disease, and Cardiac Arrhythmias.  Written material given at graduation. Flowsheet Row Pulmonary Rehab from 01/22/2023 in [facility] Cardiac and Pulmonary Rehab  Date 12/18/22  Educator SB  Instruction Review Code 1- Verbalizes Understanding       Medication Safety: - Group verbal and visual instruction to review commonly prescribed medications for heart and lung disease. Reviews the medication, class of the drug, and side effects. Includes the steps to properly store meds and maintain the prescription regimen.  Written material given at graduation.   Other: -Provides group and verbal instruction on various topics (see comments) Flowsheet Row Pulmonary Rehab from 01/22/2023 in [facility] Cardiac and Pulmonary Rehab  Date 12/25/22  Educator SB  Instruction Review Code 1- Verbalizes Understanding  [Jeopardy]       Knowledge Questionnaire Score:  Knowledge Questionnaire Score - 11/18/22 1729       Knowledge Questionnaire Score   Pre Score 16/18              Core Components/Risk Factors/Patient Goals at Admission:  Personal Goals and Risk Factors at  Admission - 11/18/22 1723       Core Components/Risk Factors/Patient Goals on Admission    Weight Management Yes    Intervention Weight Management: Develop a combined nutrition and exercise program designed to reach desired caloric intake, while maintaining appropriate intake of nutrient and fiber, sodium and fats, and appropriate energy expenditure required for the weight goal.;Weight Management: Provide education and appropriate resources to help participant work on and attain dietary goals.;Weight Management/Obesity: Establish reasonable short term and long term weight goals.    Admit Weight 190 lb 4.8 oz (86.3 kg)    Goal Weight: Short Term 185 lb (83.9 kg)    Goal Weight: Long Term 180 lb (81.6 kg)    Expected Outcomes Short Term: Continue to assess and modify interventions until short term weight is achieved;Long Term: Adherence to nutrition and physical activity/exercise program aimed toward attainment of established weight goal;Weight Loss: Understanding of general recommendations for a balanced deficit meal plan, which promotes 1-2 lb weight loss per week and includes a negative energy balance of [phone] kcal/d;Understanding recommendations for meals to include 15-35% energy as protein, 25-35% energy from fat, 35-60% energy from carbohydrates, less than 200mg  of dietary cholesterol, 20-35 gm of total fiber daily;Understanding of  distribution of calorie intake throughout the day with the consumption of 4-5 meals/snacks    Hypertension Yes    Intervention Provide education on lifestyle modifcations including regular physical activity/exercise, weight management, moderate sodium restriction and increased consumption of fresh fruit, vegetables, and low fat dairy, alcohol moderation, and smoking cessation.;Monitor prescription use compliance.    Expected Outcomes Short Term: Continued assessment and intervention until BP is < 140/79mm HG in hypertensive participants. < 130/62mm HG in hypertensive  participants with diabetes, heart failure or chronic kidney disease.;Long Term: Maintenance of blood pressure at goal levels.             Education:Diabetes - Individual verbal and written instruction to review signs/symptoms of diabetes, desired ranges of glucose level fasting, after meals and with exercise. Acknowledge that pre and post exercise glucose checks will be done for 3 sessions at entry of program.   Know Your Numbers and Heart Failure: - Group verbal and visual instruction to discuss disease risk factors for cardiac and pulmonary disease and treatment options.  Reviews associated critical values for Overweight/Obesity, Hypertension, Cholesterol, and Diabetes.  Discusses basics of heart failure: signs/symptoms and treatments.  Introduces Heart Failure Zone chart for action plan for heart failure.  Written material given at graduation.   Core Components/Risk Factors/Patient Goals Review:   Goals and Risk Factor Review     Row Name 11/28/22 1603 12/12/22 1559 01/20/23 1600         Core Components/Risk Factors/Patient Goals Review   Personal Goals Review Improve shortness of breath with ADL's Weight Management/Obesity Weight Management/Obesity;Improve shortness of breath with ADL's;Increase knowledge of respiratory medications and ability to use respiratory devices properly.;Hypertension     Review Spoke to patient about their shortness of breath and what they can do to improve. Patient has been informed of breathing techniques when starting the program. Patient is informed to tell staff if they have had any med changes and that certain meds they are taking or not taking can be causing shortness of breath. Addine wants to lose around 25 pounds to help with her breathing. She does not think she can do it but assured her that if her diet is right and she keeps exercising she can do it. Marcea is back in rehab.  Her weight has been holding steady and she feels pretty good with it.  Her  breathing has not gotten much better and she is using her inhalers and nebulizers at home.  Her blood pressures are doing well for most part but on occassion will run high.     Expected Outcomes Short: Attend [facility] regularly to improve shortness of breath with ADL's. Long: maintain independence with ADL's Short: lose a few pounds in the next few weeks. Long: Reach 25 pounds weight loss. Short: Continue  to work on breathing LonG: continue to work on weight loss.              Core Components/Risk Factors/Patient Goals at Discharge (Final Review):   Goals and Risk Factor Review - 01/20/23 1600       Core Components/Risk Factors/Patient Goals Review   Personal Goals Review Weight Management/Obesity;Improve shortness of breath with ADL's;Increase knowledge of respiratory medications and ability to use respiratory devices properly.;Hypertension    Review Tericka is back in rehab.  Her weight has been holding steady and she feels pretty good with it.  Her breathing has not gotten much better and she is using her inhalers and nebulizers at home.  Her blood pressures are  doing well for most part but on occassion will run high.    Expected Outcomes Short: Continue  to work on breathing LonG: continue to work on weight loss.             ITP Comments:  ITP Comments     Row Name 11/07/22 1442 11/18/22 1708 11/21/22 1606 12/04/22 1052 01/01/23 1507   ITP Comments Initial phone call completed. Diagnosis can be found in [facility]. EP Orientation scheduled for Monday 1/8 at 3pm. Completed 6MWT and gym orientation. Initial ITP created and sent for review to Dr. Zetta Bills, Medical Director. First full day of exercise!  Patient was oriented to gym and equipment including functions, settings, policies, and procedures.  Patient's individual exercise prescription and treatment plan were reviewed.  All starting workloads were established based on the results of the 6 minute walk test done at initial  orientation visit.  The plan for exercise progression was also introduced and progression will be customized based on patient's performance and goals. 30 Day review completed. Medical Director ITP review done, changes made as directed, and signed approval by Medical Director.   new to propgram 30 day review completed. ITP sent to Dr. Zetta Bills, Medical Director of  Pulmonary Rehab. Continue with ITP unless changes are made by physician.    [employer] Name 01/13/23 1058 01/29/23 0937         ITP Comments Vadis has been out of rehab due to surgery for descending thoracic aortic aneurysm at [facility] on 01/09/23. 30 Day review completed. Medical Director ITP review done, changes made as directed, and signed approval by Medical Director.               Comments:

## 2023-01-29 NOTE — Assessment & Plan Note (Signed)
Chronic, stable PHQ reviewed Continue cymbalta 40 mg daily

## 2023-01-29 NOTE — Progress Notes (Signed)
Normal/stable thyroid function.   Please let us know if you have any questions.  Thank you,  Tally Joe, FNP

## 2023-02-03 ENCOUNTER — Encounter: Payer: 59 | Admitting: *Deleted

## 2023-02-03 DIAGNOSIS — J449 Chronic obstructive pulmonary disease, unspecified: Secondary | ICD-10-CM | POA: Diagnosis not present

## 2023-02-03 NOTE — Progress Notes (Signed)
Daily Session Note  Patient Details  Name: Nancy [name] MRN: [id number] Date of Birth: [birth year] Referring Provider:   Flowsheet Row Pulmonary Rehab from 11/18/2022 in [facility] Cardiac and Pulmonary Rehab  Referring Provider [name]       Encounter Date: 02/03/2023  Check In:  Session Check In - 02/03/23 1622       Check-In   Supervising physician immediately available to respond to emergencies See telemetry face sheet for immediately available ER MD    Location [facility]-Cardiac & Pulmonary Rehab    Staff Present Darlyne Russian, RN, Dimple Nanas, BS, Exercise Physiologist;Joseph Tessie Fass, [place]    Virtual Visit No    Medication changes reported     No    Fall or balance concerns reported    No    Warm-up and Cool-down Performed on first and last piece of equipment    Resistance Training Performed Yes    VAD Patient? No    PAD/SET Patient? No      Pain Assessment   Currently in Pain? No/denies                Social History   Tobacco Use  Smoking Status Former   Packs/day: 0.50   Years: 30.00   Additional pack years: 0.00   Total pack years: 15.00   Types: Cigarettes   Quit date: 03/11/2016   Years since quitting: 6.9  Smokeless Tobacco Never  Tobacco Comments   patient states  that she is working at her own pace to quit smoking    Goals Met:  Independence with exercise equipment Exercise tolerated well No report of concerns or symptoms today Strength training completed today  Goals Unmet:  Not Applicable  Comments: Pt able to follow exercise prescription today without complaint.  Will continue to monitor for progression.    Dr. Emily Filbert is Medical Director for [place].  Dr. Ottie Glazier is Medical Director for [facility] Pulmonary Rehabilitation.

## 2023-02-05 ENCOUNTER — Encounter: Payer: 59 | Admitting: *Deleted

## 2023-02-05 DIAGNOSIS — J449 Chronic obstructive pulmonary disease, unspecified: Secondary | ICD-10-CM | POA: Diagnosis not present

## 2023-02-05 NOTE — Progress Notes (Signed)
Daily Session Note  Patient Details  Name: Nancy [name] MRN: [id number] Date of Birth: 1961/09/30 Referring Provider:   Flowsheet Row Pulmonary Rehab from 11/18/2022 in [facility] Cardiac and Pulmonary Rehab  Referring Provider [name]       Encounter Date: 02/05/2023  Check In:  Session Check In - 02/05/23 1551       Check-In   Supervising physician immediately available to respond to emergencies See telemetry face sheet for immediately available ER MD    Location [facility]-Cardiac & Pulmonary Rehab    Staff Present Renita Papa, RN BSN;Joseph Tessie Fass, RCP,RRT,BSRT;Megan Tamala Julian, RN, [place]    Virtual Visit No    Medication changes reported     No    Fall or balance concerns reported    No    Warm-up and Cool-down Performed on first and last piece of equipment    Resistance Training Performed Yes    VAD Patient? No    PAD/SET Patient? No      Pain Assessment   Currently in Pain? No/denies                Social History   Tobacco Use  Smoking Status Former   Packs/day: 0.50   Years: 30.00   Additional pack years: 0.00   Total pack years: 15.00   Types: Cigarettes   Quit date: 03/11/2016   Years since quitting: 6.9  Smokeless Tobacco Never  Tobacco Comments   patient states  that she is working at her own pace to quit smoking    Goals Met:  Independence with exercise equipment Exercise tolerated well No report of concerns or symptoms today Strength training completed today  Goals Unmet:  Not Applicable  Comments: Pt able to follow exercise prescription today without complaint.  Will continue to monitor for progression.    Dr. Emily Filbert is Medical Director for [place].  Dr. Ottie Glazier is Medical Director for [facility] Pulmonary Rehabilitation.

## 2023-02-06 ENCOUNTER — Encounter: Payer: 59 | Admitting: *Deleted

## 2023-02-06 DIAGNOSIS — J449 Chronic obstructive pulmonary disease, unspecified: Secondary | ICD-10-CM

## 2023-02-06 NOTE — Progress Notes (Signed)
Daily Session Note  Patient Details  Name: Nancy [name] MRN: [id number] Date of Birth: May 21, 1961 Referring Provider:   Flowsheet Row Pulmonary Rehab from 11/18/2022 in [facility] Cardiac and Pulmonary Rehab  Referring Provider [name]       Encounter Date: 02/06/2023  Check In:  Session Check In - 02/06/23 1532       Check-In   Supervising physician immediately available to respond to emergencies See telemetry face sheet for immediately available ER MD    Location [facility]-Cardiac & Pulmonary Rehab    Staff Present Renita Papa, RN BSN;Joseph [place], RCP,RRT,BSRT;Jessica [place], [place], RCEP, CCRP, CCET    Virtual Visit No    Medication changes reported     No    Fall or balance concerns reported    No    Warm-up and Cool-down Performed on first and last piece of equipment    Resistance Training Performed Yes    VAD Patient? No    PAD/SET Patient? No      Pain Assessment   Currently in Pain? No/denies                Social History   Tobacco Use  Smoking Status Former   Packs/day: 0.50   Years: 30.00   Additional pack years: 0.00   Total pack years: 15.00   Types: Cigarettes   Quit date: 03/11/2016   Years since quitting: 6.9  Smokeless Tobacco Never  Tobacco Comments   patient states  that she is working at her own pace to quit smoking    Goals Met:  Independence with exercise equipment Exercise tolerated well No report of concerns or symptoms today Strength training completed today  Goals Unmet:  Not Applicable  Comments: Pt able to follow exercise prescription today without complaint.  Will continue to monitor for progression.    Dr. Emily Filbert is Medical Director for [place].  Dr. Ottie Glazier is Medical Director for [facility] Pulmonary Rehabilitation.

## 2023-02-10 ENCOUNTER — Encounter: Payer: 59 | Attending: Specialist | Admitting: *Deleted

## 2023-02-10 DIAGNOSIS — J449 Chronic obstructive pulmonary disease, unspecified: Secondary | ICD-10-CM | POA: Insufficient documentation

## 2023-02-10 NOTE — Progress Notes (Signed)
Daily Session Note  Patient Details  Name: Nancy [name] MRN: [id number] Date of Birth: 26-Feb-1961 Referring Provider:   Flowsheet Row Pulmonary Rehab from 11/18/2022 in [facility] Cardiac and Pulmonary Rehab  Referring Provider [name]       Encounter Date: 02/10/2023  Check In:  Session Check In - 02/10/23 1621       Check-In   Supervising physician immediately available to respond to emergencies See telemetry face sheet for immediately available ER MD    Location [facility]-Cardiac & Pulmonary Rehab    Staff Present Renita Papa, RN BSN;Joseph [place], RCP,RRT,BSRT;Noah [place], [place], Exercise Physiologist    Virtual Visit No    Medication changes reported     No    Fall or balance concerns reported    No    Warm-up and Cool-down Performed on first and last piece of equipment    Resistance Training Performed Yes      Pain Assessment   Currently in Pain? No/denies                Social History   Tobacco Use  Smoking Status Former   Packs/day: 0.50   Years: 30.00   Additional pack years: 0.00   Total pack years: 15.00   Types: Cigarettes   Quit date: 03/11/2016   Years since quitting: 6.9  Smokeless Tobacco Never  Tobacco Comments   patient states  that she is working at her own pace to quit smoking    Goals Met:  Independence with exercise equipment Exercise tolerated well No report of concerns or symptoms today Strength training completed today  Goals Unmet:  Not Applicable  Comments: Pt able to follow exercise prescription today without complaint.  Will continue to monitor for progression.    Dr. Emily Filbert is Medical Director for [name].  Dr. Ottie Glazier is Medical Director for [facility] Pulmonary Rehabilitation.

## 2023-02-12 ENCOUNTER — Encounter: Payer: 59 | Admitting: *Deleted

## 2023-02-12 VITALS — Ht 67.5 in | Wt 185.4 lb

## 2023-02-12 DIAGNOSIS — J449 Chronic obstructive pulmonary disease, unspecified: Secondary | ICD-10-CM | POA: Diagnosis not present

## 2023-02-12 NOTE — Progress Notes (Signed)
Daily Session Note  Patient Details  Name: Nancy [name] MRN: [id number] Date of Birth: 24-Apr-1961 Referring Provider:   Flowsheet Row Pulmonary Rehab from 11/18/2022 in [facility] Cardiac and Pulmonary Rehab  Referring Provider [name]       Encounter Date: 02/12/2023  Check In:  Session Check In - 02/12/23 1543       Check-In   Supervising physician immediately available to respond to emergencies See telemetry face sheet for immediately available ER MD    Location [facility]-Cardiac & Pulmonary Rehab    Staff Present Renita Papa, RN BSN;Laureen Owens Shark, BS, RRT, CPFT;Joseph [place], [place]    Virtual Visit No    Medication changes reported     No    Fall or balance concerns reported    No    Warm-up and Cool-down Performed on first and last piece of equipment    Resistance Training Performed Yes    VAD Patient? No    PAD/SET Patient? No      Pain Assessment   Currently in Pain? No/denies                Social History   Tobacco Use  Smoking Status Former   Packs/day: 0.50   Years: 30.00   Additional pack years: 0.00   Total pack years: 15.00   Types: Cigarettes   Quit date: 03/11/2016   Years since quitting: 6.9  Smokeless Tobacco Never  Tobacco Comments   patient states  that she is working at her own pace to quit smoking    Goals Met:  Independence with exercise equipment Exercise tolerated well No report of concerns or symptoms today Strength training completed today  Goals Unmet:  Not Applicable  Comments: Pt able to follow exercise prescription today without complaint.  Will continue to monitor for progression.    Dr. Emily Filbert is Medical Director for [place].  Dr. Ottie Glazier is Medical Director for [facility] Pulmonary Rehabilitation.

## 2023-02-12 NOTE — Patient Instructions (Signed)
Discharge Patient Instructions  Patient Details  Name: Nancy [name] MRN: [id number] Date of Birth: 03-29-1961 Referring Provider:  Gwyneth Sprout, FNP   Number of Visits: 36  Reason for Discharge:  Patient reached a stable level of exercise. Patient independent in their exercise. Patient has met program and personal goals.  Smoking History:  Social History   Tobacco Use  Smoking Status Former   Packs/day: 0.50   Years: 30.00   Additional pack years: 0.00   Total pack years: 15.00   Types: Cigarettes   Quit date: 03/11/2016   Years since quitting: 6.9  Smokeless Tobacco Never  Tobacco Comments   patient states  that she is working at her own pace to quit smoking    Diagnosis:  Stage 3 severe COPD by GOLD classification  Initial Exercise Prescription:  Initial Exercise Prescription - 11/18/22 1700       Date of Initial Exercise RX and Referring Provider   Date 11/18/22    Referring Provider Nancy [name]      Oxygen   Oxygen Continuous    Liters 2    Maintain Oxygen Saturation 88% or higher      Treadmill   MPH 0.8    Grade 0    Minutes 15    METs 1      Recumbant Bike   Level 1    RPM 50    Minutes 15    METs 1.46      NuStep   Level 1    SPM 80    Minutes 15    METs 1.46      REL-XR   Level 1    Speed 50    Minutes 15    METs 1.46      T5 Nustep   Level 1    SPM 80    Minutes 15    METs 2.3      Track   Laps 10    Minutes 15    METs 1.54      Prescription Details   Frequency (times per week) 3    Duration Progress to 30 minutes of continuous aerobic without signs/symptoms of physical distress      Intensity   THRR 40-80% of Max Heartrate 117-146    Perceived Dyspnea 0-4      Progression   Progression Continue to progress workloads to maintain intensity without signs/symptoms of physical distress.      Resistance Training   Training Prescription Yes    Weight 2    Reps 10-15             Discharge Exercise Prescription  (Final Exercise Prescription Changes):  Exercise Prescription Changes - 02/04/23 1500       Response to Exercise   Blood Pressure (Admit) 118/66    Blood Pressure (Exit) 96/60    Heart Rate (Admit) 98 bpm    Heart Rate (Exercise) 119 bpm    Heart Rate (Exit) 101 bpm    Oxygen Saturation (Admit) 98 %    Oxygen Saturation (Exercise) 91 %    Oxygen Saturation (Exit) 98 %    Rating of Perceived Exertion (Exercise) 12    Perceived Dyspnea (Exercise) 3    Symptoms SOB    Duration Continue with 30 min of aerobic exercise without signs/symptoms of physical distress.    Intensity THRR unchanged      Progression   Progression Continue to progress workloads to maintain intensity without signs/symptoms of physical distress.  Average METs 2.39      Resistance Training   Training Prescription Yes    Weight 2 lb    Reps 10-15      Interval Training   Interval Training No      Oxygen   Oxygen Continuous    Liters 2      Treadmill   MPH 1.5    Grade 0    Minutes 15    METs 2.15      NuStep   Level 4    Minutes 15    METs 2.9      REL-XR   Level 4    Minutes 15    METs 3.7      T5 Nustep   Level 1    Minutes 15    METs 1.8      Home Exercise Plan   Plans to continue exercise at Home (comment)   Walking at home, 2 lb hand weights   Frequency Add 2 additional days to program exercise sessions.    Initial Home Exercises Provided 01/15/23      Oxygen   Maintain Oxygen Saturation 88% or higher             Functional Capacity:  6 Minute Walk     Row Name 11/18/22 1708 02/12/23 1600       6 Minute Walk   Phase Initial Discharge    Distance 580 feet 985 feet    Distance % Change -- 69.8 %    Distance Feet Change -- 505 ft    Walk Time 4.5 minutes 6 minutes    # of Rest Breaks 4 0    MPH 1.46 1.87    METS 2.3 2.83    RPE 14 11    Perceived Dyspnea  3 2    VO2 Peak 8.04 9.88    Symptoms Yes (comment)  SOB Yes (comment)    Comments -- SOB    Resting HR  89 bpm 83 bpm    Resting BP 138/84 104/60    Resting Oxygen Saturation  93 % 98 %    Exercise Oxygen Saturation  during 6 min walk 94 % 96 %    Max Ex. HR 104 bpm 100 bpm    Max Ex. BP 146/72 122/64    2 Minute Post BP 122/60 110/60      Interval HR   1 Minute HR 99 88    2 Minute HR 104 96    3 Minute HR 95 97    4 Minute HR 95 97    5 Minute HR 95 100    6 Minute HR 98 98    2 Minute Post HR 98 93    Interval Heart Rate? Yes Yes      Interval Oxygen   Interval Oxygen? Yes Yes    Baseline Oxygen Saturation % 93 % 98 %    1 Minute Oxygen Saturation % 99 % 98 %    1 Minute Liters of Oxygen 2 L 2 L    2 Minute Oxygen Saturation % 104 % 98 %    2 Minute Liters of Oxygen 2 L 2 L    3 Minute Oxygen Saturation % 95 % 98 %    3 Minute Liters of Oxygen 2 L 2 L    4 Minute Oxygen Saturation % 95 % 96 %    4 Minute Liters of Oxygen 2 L 2 L    5 Minute Oxygen  Saturation % 95 % 97 %    5 Minute Liters of Oxygen 2 L 2 L    6 Minute Oxygen Saturation % 98 % 96 %    6 Minute Liters of Oxygen 2 L 2 L    2 Minute Post Oxygen Saturation % 98 % 98 %    2 Minute Post Liters of Oxygen 2 L 2 L            Nutrition & Weight - Outcomes:  Pre Biometrics - 11/18/22 1722       Pre Biometrics   Height 5' 7.5" (1.715 m)    Weight 190 lb 4.8 oz (86.3 kg)    Waist Circumference 40 inches    Hip Circumference 44 inches    Waist to Hip Ratio 0.91 %    BMI (Calculated) 29.35    Single Leg Stand 2.66 seconds             Post Biometrics - 02/12/23 1604        Post  Biometrics   Height 5' 7.5" (1.715 m)    Weight 185 lb 6.4 oz (84.1 kg)    Waist Circumference 41 inches    Hip Circumference 42 inches    Waist to Hip Ratio 0.98 %    BMI (Calculated) 28.59    Single Leg Stand 3.06 seconds            Nutrition:  Nutrition Therapy & Goals - 02/03/23 1541       Personal Nutrition Goals   Comments B: boiled eggs L: sandwich (white bread - [place] and mayo) and some chips or  leftovers D: variety (pre-prepared or take out/restaurants) - examples include salad, pasta, bowl of soup from [employer], frozen vegetables. S: does not usually snack Drinks: diet pepsi. Nancy [name] reports that her weight has been stable- surgery this past November when her appetite has been decreased previously and this has made it worse; she went from 216 to180 lbs  (she reports 16 lbs in 4 weeks and it slowed down after that).      Intervention Plan   Intervention Prescribe, educate and counsel regarding individualized specific dietary modifications aiming towards targeted core components such as weight, hypertension, lipid management, diabetes, heart failure and other comorbidities.    Expected Outcomes Short Term Goal: Understand basic principles of dietary content, such as calories, fat, sodium, cholesterol and nutrients.;Short Term Goal: A plan has been developed with personal nutrition goals set during dietitian appointment.;Long Term Goal: Adherence to prescribed nutrition plan.

## 2023-02-13 ENCOUNTER — Encounter: Payer: 59 | Admitting: *Deleted

## 2023-02-13 DIAGNOSIS — J449 Chronic obstructive pulmonary disease, unspecified: Secondary | ICD-10-CM

## 2023-02-13 NOTE — Progress Notes (Signed)
Daily Session Note  Patient Details  Name: Nancy [name] MRN: [id number] Date of Birth: 11/29/1960 Referring Provider:   Flowsheet Row Pulmonary Rehab from 11/18/2022 in [facility] Cardiac and Pulmonary Rehab  Referring Provider [name]       Encounter Date: 02/13/2023  Check In:  Session Check In - 02/13/23 1546       Check-In   Supervising physician immediately available to respond to emergencies See telemetry face sheet for immediately available ER MD    Location [facility]-Cardiac & Pulmonary Rehab    Staff Present Darlyne Russian, RN, ADN;Laureen Owens Shark, BS, RRT, CPFT;Joseph Tessie Fass, [place]    Virtual Visit No    Medication changes reported     No    Fall or balance concerns reported    No    Warm-up and Cool-down Performed on first and last piece of equipment    Resistance Training Performed Yes    VAD Patient? No    PAD/SET Patient? No      Pain Assessment   Currently in Pain? No/denies                Social History   Tobacco Use  Smoking Status Former   Packs/day: 0.50   Years: 30.00   Additional pack years: 0.00   Total pack years: 15.00   Types: Cigarettes   Quit date: 03/11/2016   Years since quitting: 6.9  Smokeless Tobacco Never  Tobacco Comments   patient states  that she is working at her own pace to quit smoking    Goals Met:  Independence with exercise equipment Exercise tolerated well No report of concerns or symptoms today Strength training completed today  Goals Unmet:  Not Applicable  Comments: Pt able to follow exercise prescription today without complaint.  Will continue to monitor for progression.    Dr. Emily Filbert is Medical Director for [place].  Dr. Ottie Glazier is Medical Director for [facility] Pulmonary Rehabilitation.

## 2023-02-20 ENCOUNTER — Encounter: Payer: 59 | Admitting: *Deleted

## 2023-02-20 DIAGNOSIS — J449 Chronic obstructive pulmonary disease, unspecified: Secondary | ICD-10-CM

## 2023-02-20 NOTE — Progress Notes (Signed)
Daily Session Note  Patient Details  Name: Nancy [name] MRN: [id number] Date of Birth: 1961-02-28 Referring Provider:   Flowsheet Row Pulmonary Rehab from 11/18/2022 in [facility] Cardiac and Pul[facility] Re[facility]  Referring Provider Meredeth Ide       Encounter Date: 02/20/2023  Check In:  Session Check In - 02/20/23 1534       Check-In   Supervising physician immediately available to respond to emergencies See telemetry face sheet for immediately available ER MD    Location [facility]-Cardiac & Pulmonary Rehab    Staff Present Susann Givens, RN BSN;Joseph Reino Kent, RCP,RRT,BSRT;Noah Isabela, [place], Exercise Physiologist    Virtual Visit No    Medication changes reported     No    Fall or balance concerns reported    No    Warm-up and Cool-down Performed on first and last piece of equipment    Resistance Training Performed Yes    VAD Patient? No    PAD/SET Patient? No      Pain Assessment   Currently in Pain? No/denies                Social History   Tobacco Use  Smoking Status Former   Packs/day: 0.50   Years: 30.00   Additional pack years: 0.00   Total pack years: 15.00   Types: Cigarettes   Quit date: 03/11/2016   Years since quitting: 6.9  Smokeless Tobacco Never  Tobacco Comments   patient states  that she is working at her own pace to quit smoking    Goals Met:  Independence with exercise equipment Exercise tolerated well No report of concerns or symptoms today Strength training completed today  Goals Unmet:  Not Applicable  Comments: Pt able to follow exercise prescription today without complaint.  Will continue to monitor for progression.    Dr. Bethann Punches is Medical Director for [facility] Card[facility] Rehabilitation.  Dr. Vida Rigger is Medical Director for [facility] Pulmonary Rehabilitation.

## 2023-02-21 ENCOUNTER — Other Ambulatory Visit: Payer: Self-pay | Admitting: Family Medicine

## 2023-02-21 DIAGNOSIS — E038 Other specified hypothyroidism: Secondary | ICD-10-CM

## 2023-02-23 ENCOUNTER — Other Ambulatory Visit: Payer: Self-pay | Admitting: Family Medicine

## 2023-02-23 DIAGNOSIS — E038 Other specified hypothyroidism: Secondary | ICD-10-CM

## 2023-02-23 MED ORDER — LEVOTHYROXINE SODIUM 125 MCG PO TABS: 125.0000 ug | ORAL_TABLET | Freq: Every day | ORAL | 3 refills | Status: DC

## 2023-02-24 ENCOUNTER — Encounter: Payer: 59 | Admitting: *Deleted

## 2023-02-24 DIAGNOSIS — J449 Chronic obstructive pulmonary disease, unspecified: Secondary | ICD-10-CM

## 2023-02-24 NOTE — Progress Notes (Signed)
Daily Session Note  Patient Details  Name: Nancy [name] MRN: [id number] Date of Birth: [birth year] Referring Provider:   Flowsheet Row Pulmonary Rehab from 11/18/2022 in [facility] Cardiac and Pulmonary Rehab  Referring Provider Meredeth Ide       Encounter Date: 02/24/2023  Check In:  Session Check In - 02/24/23 1610       Check-In   Supervising physician immediately available to respond to emergencies See telemetry face sheet for immediately available ER MD    Location [facility]-Cardiac & Pulmonary Rehab    Staff Present Lanny Hurst, RN, ADN;Joseph Reino Kent, RCP,RRT,BSRT;Noah Tickle, BS, Exercise Physiologist    Virtual Visit No    Medication changes reported     No    Fall or balance concerns reported    No    Warm-up and Cool-down Performed on first and last piece of equipment    Resistance Training Performed Yes    VAD Patient? No    PAD/SET Patient? No      Pain Assessment   Currently in Pain? No/denies                Social History   Tobacco Use  Smoking Status Former   Packs/day: 0.50   Years: 30.00   Additional pack years: 0.00   Total pack years: 15.00   Types: Cigarettes   Quit date: 03/11/2016   Years since quitting: 6.9  Smokeless Tobacco Never  Tobacco Comments   patient states  that she is working at her own pace to quit smoking    Goals Met:  Independence with exercise equipment Exercise tolerated well No report of concerns or symptoms today Strength training completed today  Goals Unmet:  Not Applicable  Comments: Pt able to follow exercise prescription today without complaint.  Will continue to monitor for progression.    Dr. Bethann Punches is Medical Director for [facility] Cardiac Rehabilitation.  Dr. Vida Rigger is Medical Director for [facility] Pulmonary Rehabilitation.

## 2023-02-26 ENCOUNTER — Encounter: Payer: Self-pay | Admitting: *Deleted

## 2023-02-26 ENCOUNTER — Encounter: Payer: 59 | Admitting: *Deleted

## 2023-02-26 DIAGNOSIS — J449 Chronic obstructive pulmonary disease, unspecified: Secondary | ICD-10-CM

## 2023-02-26 NOTE — Progress Notes (Signed)
Pulmonary Individual Treatment Plan  Patient Details  Name: Nancy [name] MRN: [id number] Date of Birth: 03-Nov-1961 Referring Provider:   Flowsheet Row Pulmonary Rehab from 11/18/2022 in [facility] Cardiac and Pulmonary Rehab  Referring Provider Meredeth Ide       Initial Encounter Date:  Flowsheet Row Pulmonary Rehab from 11/18/2022 in [facility] Cardiac and Pulmonary Rehab  Date 11/18/22       Visit Diagnosis: Stage 3 severe COPD by GOLD classification  Patient's Home Medications on Admission:  Current Outpatient Medications:    albuterol (VENTOLIN HFA) 108 (90 Base) MCG/ACT inhaler, SMARTSIG:2 Inhalation Via Inhaler Every 6 Hours PRN, Disp: , Rfl:    amLODipine (NORVASC) 5 MG tablet, Take 5 mg by mouth daily., Disp: , Rfl:    aspirin EC 81 MG tablet, Take by mouth., Disp: , Rfl:    budesonide-formoterol (SYMBICORT) 160-4.5 MCG/ACT inhaler, Inhale 2 puffs into the lungs daily. , Disp: , Rfl:    carvedilol (COREG) 25 MG tablet, Take 25 mg by mouth 2 (two) times daily., Disp: , Rfl:    doxycycline (PERIOSTAT) 20 MG tablet, Take 1 tablet (20 mg total) by mouth 2 (two) times daily., Disp: 60 tablet, Rfl: 6   DULoxetine (CYMBALTA) 20 MG capsule, Take 2 capsules (40 mg total) by mouth 2 (two) times daily., Disp: 360 capsule, Rfl: 3   furosemide (LASIX) 40 MG tablet, Take 40 mg by mouth daily., Disp: , Rfl:    levothyroxine (SYNTHROID) 125 MCG tablet, Take 1 tablet (125 mcg total) by mouth daily before breakfast., Disp: 90 tablet, Rfl: 3   ondansetron (ZOFRAN ODT) 4 MG disintegrating tablet, Take 1 tablet (4 mg total) by mouth every 8 (eight) hours as needed for nausea or vomiting., Disp: 20 tablet, Rfl: 2   spironolactone (ALDACTONE) 25 MG tablet, Take 25 mg by mouth daily., Disp: , Rfl:    topiramate (TOPAMAX) 50 MG tablet, Take 50 mg by mouth 2 (two) times daily., Disp: , Rfl:    valACYclovir (VALTREX) 1000 MG tablet, , Disp: , Rfl:    warfarin (COUMADIN) 2.5 MG tablet, Take 5 mg by mouth daily., Disp: ,  Rfl:   Current Facility-Administered Medications:    ipratropium-albuterol (DUONEB) 0.5-2.5 (3) MG/3ML nebulizer solution 3 mL, 3 mL, Nebulization, Once, Pollak, Adriana M, PA-C  Past Medical History: Past Medical History:  Diagnosis Date   Aortic dissection (HCC) 2010   COPD (chronic obstructive pulmonary disease) (HCC)    Depression    Hypertension    Hypothyroidism    Pneumonia    PONV (postoperative nausea and vomiting)    Rosacea    Seasonal allergies    Sleep apnea    Does not use CPAP    Tobacco Use: Social History   Tobacco Use  Smoking Status Former   Packs/day: 0.50   Years: 30.00   Additional pack years: 0.00   Total pack years: 15.00   Types: Cigarettes   Quit date: 03/11/2016   Years since quitting: 6.9  Smokeless Tobacco Never  Tobacco Comments   patient states  that she is working at her own pace to quit smoking    Labs: Review Flowsheet       Latest Ref Rng & Units 07/02/2009 08/29/2015 01/05/2018 09/29/2020  Labs for ITP Cardiac and Pulmonary Rehab  Cholestrol 100 - 199 mg/dL - 478  295  621   LDL (calc) 0 - 99 mg/dL - 308  657  846   HDL-C >39 mg/dL - 53  50  45  Trlycerides 0 - 149 mg/dL - 098  119  147   TCO2 0 - 100 mmol/L 22  - - -     Pulmonary Assessment Scores:  Pulmonary Assessment Scores     Row Name 11/18/22 1739 02/13/23 1557       ADL UCSD   ADL Phase -- Exit    SOB Score total 36 33    Rest 0 0    Walk 4 2    Stairs 4 4    Bath 3 1    Dress 1 0    Shop 3 2      CAT Score   CAT Score 16 11      mMRC Score   mMRC Score 2.5 --             UCSD: Self-administered rating of dyspnea associated with activities of daily living (ADLs) 6-point scale (0 = "not at all" to 5 = "maximal or unable to do because of breathlessness")  Scoring Scores range from 0 to 120.  Minimally important difference is 5 units  CAT: CAT can identify the health impairment of COPD patients and is better correlated with disease progression.   CAT has a scoring range of zero to 40. The CAT score is classified into four groups of low (less than 10), medium (10 - 20), high (21-30) and very high (31-40) based on the impact level of disease on health status. A CAT score over 10 suggests significant symptoms.  A worsening CAT score could be explained by an exacerbation, poor medication adherence, poor inhaler technique, or progression of COPD or comorbid conditions.  CAT MCID is 2 points  mMRC: mMRC (Modified Medical Research Council) Dyspnea Scale is used to assess the degree of baseline functional disability in patients of respiratory disease due to dyspnea. No minimal important difference is established. A decrease in score of 1 point or greater is considered a positive change.   Pulmonary Function Assessment:  Pulmonary Function Assessment - 11/18/22 1734       Initial Spirometry Results   FVC% 72 %    FEV1% 34 %    FEV1/FVC Ratio 38      Post Bronchodilator Spirometry Results   FVC% 79 %    FEV1% 37 %    FEV1/FVC Ratio 38             Exercise Target Goals: Exercise Program Goal: Individual exercise prescription set using results from initial 6 min walk test and THRR while considering  patient's activity barriers and safety.   Exercise Prescription Goal: Initial exercise prescription builds to 30-45 minutes a day of aerobic activity, 2-3 days per week.  Home exercise guidelines will be given to patient during program as part of exercise prescription that the participant will acknowledge.  Education: Aerobic Exercise: - Group verbal and visual presentation on the components of exercise prescription. Introduces F.I.T.T principle from ACSM for exercise prescriptions.  Reviews F.I.T.T. principles of aerobic exercise including progression. Written material given at graduation. Flowsheet Row Pulmonary Rehab from 01/29/2023 in [facility] Cardiac and Pulmonary Rehab  Date 11/27/22  Educator KW  Instruction Review Code 1-  [employer] Understanding       Education: Resistance Exercise: - Group verbal and visual presentation on the components of exercise prescription. Introduces F.I.T.T principle from ACSM for exercise prescriptions  Reviews F.I.T.T. principles of resistance exercise including progression. Written material given at graduation. Flowsheet Row Pulmonary Rehab from 01/29/2023 in [facility] Cardiac and Pulmonary Rehab  Date 12/04/22  Educator KW  Instruction Review Code 1- [employer] Understanding        Education: Exercise & Equipment Safety: - Individual verbal instruction and demonstration of equipment use and safety with use of the equipment. Flowsheet Row Pulmonary Rehab from 01/29/2023 in [facility] Cardiac and Pulmonary Rehab  Date 11/18/22  Educator [facility]  Instruction Review Code 1- Verbalizes Understanding       Education: Exercise Physiology & General Exercise Guidelines: - Group verbal and written instruction with models to review the exercise physiology of the cardiovascular system and associated critical values. Provides general exercise guidelines with specific guidelines to those with heart or lung disease.    Education: Flexibility, Balance, Mind/Body Relaxation: - Group verbal and visual presentation with interactive activity on the components of exercise prescription. Introduces F.I.T.T principle from ACSM for exercise prescriptions. Reviews F.I.T.T. principles of flexibility and balance exercise training including progression. Also discusses the mind body connection.  Reviews various relaxation techniques to help reduce and manage stress (i.e. Deep breathing, progressive muscle relaxation, and visualization). Balance handout provided to take home. Written material given at graduation. Flowsheet Row Pulmonary Rehab from 01/29/2023 in [facility] Cardiac and Pulmonary Rehab  Date 12/11/22  Educator KW  Instruction Review Code 1- Verbalizes Understanding       Activity Barriers & Risk  Stratification:  Activity Barriers & Cardiac Risk Stratification - 11/18/22 1716       Activity Barriers & Cardiac Risk Stratification   Activity Barriers None             6 Minute Walk:  6 Minute Walk     Row Name 11/18/22 1708 02/12/23 1600       6 Minute Walk   Phase Initial Discharge    Distance 580 feet 985 feet    Distance % Change -- 69.8 %    Distance Feet Change -- 505 ft    Walk Time 4.5 minutes 6 minutes    # of Rest Breaks 4 0    MPH 1.46 1.87    METS 2.3 2.83    RPE 14 11    Perceived Dyspnea  3 2    VO2 Peak 8.04 9.88    Symptoms Yes (comment)  SOB Yes (comment)    Comments -- SOB    Resting HR 89 bpm 83 bpm    Resting BP 138/84 104/60    Resting Oxygen Saturation  93 % 98 %    Exercise Oxygen Saturation  during 6 min walk 94 % 96 %    Max Ex. HR 104 bpm 100 bpm    Max Ex. BP 146/72 122/64    2 Minute Post BP 122/60 110/60      Interval HR   1 Minute HR 99 88    2 Minute HR 104 96    3 Minute HR 95 97    4 Minute HR 95 97    5 Minute HR 95 100    6 Minute HR 98 98    2 Minute Post HR 98 93    Interval Heart Rate? Yes Yes      Interval Oxygen   Interval Oxygen? Yes Yes    Baseline Oxygen Saturation % 93 % 98 %    1 Minute Oxygen Saturation % 99 % 98 %    1 Minute Liters of Oxygen 2 L 2 L    2 Minute Oxygen Saturation % 104 % 98 %    2 Minute Liters of Oxygen 2 L 2 L  3 Minute Oxygen Saturation % 95 % 98 %    3 Minute Liters of Oxygen 2 L 2 L    4 Minute Oxygen Saturation % 95 % 96 %    4 Minute Liters of Oxygen 2 L 2 L    5 Minute Oxygen Saturation % 95 % 97 %    5 Minute Liters of Oxygen 2 L 2 L    6 Minute Oxygen Saturation % 98 % 96 %    6 Minute Liters of Oxygen 2 L 2 L    2 Minute Post Oxygen Saturation % 98 % 98 %    2 Minute Post Liters of Oxygen 2 L 2 L            Oxygen Initial Assessment:  Oxygen Initial Assessment - 11/18/22 1732       Home Oxygen   Home Oxygen Device Portable Concentrator    Sleep Oxygen  Prescription Continuous    Home Exercise Oxygen Prescription --    Liters per minute 2    Home Resting Oxygen Prescription --    Liters per minute 2    Compliance with Home Oxygen Use No      Initial 6 min Walk   Oxygen Used Continuous    Liters per minute 2      Program Oxygen Prescription   Program Oxygen Prescription Continuous    Liters per minute 2      Intervention   Short Term Goals To learn and understand importance of monitoring SPO2 with pulse oximeter and demonstrate accurate use of the pulse oximeter.;To learn and understand importance of maintaining oxygen saturations>88%;To learn and demonstrate proper pursed lip breathing techniques or other breathing techniques. ;To learn and demonstrate proper use of respiratory medications    Long  Term Goals Verbalizes importance of monitoring SPO2 with pulse oximeter and return demonstration;Maintenance of O2 saturations>88%;Exhibits proper breathing techniques, such as pursed lip breathing or other method taught during program session;Compliance with respiratory medication             Oxygen Re-Evaluation:  Oxygen Re-Evaluation     Row Name 11/21/22 1608 12/12/22 1208 01/20/23 1602 02/10/23 1547       Program Oxygen Prescription   Program Oxygen Prescription -- Continuous Continuous Continuous    Liters per minute -- 2 2 2       Home Oxygen   Home Oxygen Device -- Portable Concentrator Portable Concentrator Portable Concentrator;Home Concentrator    Sleep Oxygen Prescription -- Continuous Continuous Continuous    Liters per minute -- 2 2 2     Home Exercise Oxygen Prescription -- Continuous Continuous Continuous    Liters per minute -- 2 2 2     Home Resting Oxygen Prescription -- Continuous None None    Liters per minute -- 2 -- 0    Compliance with Home Oxygen Use -- No Yes Yes      Goals/Expected Outcomes   Short Term Goals To learn and understand importance of monitoring SPO2 with pulse oximeter and demonstrate  accurate use of the pulse oximeter.;To learn and understand importance of maintaining oxygen saturations>88%;To learn and demonstrate proper pursed lip breathing techniques or other breathing techniques. ;To learn and demonstrate proper use of respiratory medications To learn and demonstrate proper pursed lip breathing techniques or other breathing techniques.  To learn and demonstrate proper pursed lip breathing techniques or other breathing techniques. ;To learn and understand importance of monitoring SPO2 with pulse oximeter and demonstrate accurate use of the  pulse oximeter.;To learn and understand importance of maintaining oxygen saturations>88%;To learn and demonstrate proper use of respiratory medications;To learn and exhibit compliance with exercise, home and travel O2 prescription Other    Long  Term Goals Verbalizes importance of monitoring SPO2 with pulse oximeter and return demonstration;Maintenance of O2 saturations>88%;Exhibits proper breathing techniques, such as pursed lip breathing or other method taught during program session;Compliance with respiratory medication Exhibits proper breathing techniques, such as pursed lip breathing or other method taught during program session Exhibits proper breathing techniques, such as pursed lip breathing or other method taught during program session;Maintenance of O2 saturations>88%;Verbalizes importance of monitoring SPO2 with pulse oximeter and return demonstration;Exhibits compliance with exercise, home  and travel O2 prescription;Compliance with respiratory medication;Demonstrates proper use of MDI's Other    Comments Reviewed PLB technique with pt.  Talked about how it works and it's importance in maintaining their exercise saturations. Diaphragmatic and PLB breathing explained and performed with patient. Patient has a better understanding of how to do these exercises to help with breathing performance and relaxation. Patient performed breathing  techniques adequately and to practice further at home. Jeslin is doing well with using her PLB. She is good about keeping an eye on her saturations.  She will use her oxygen every night like she is supposed to and during the day for activity.  She is good when she resting.  She feels that she is doing well with where she is with her breathing. Mikiyah does not have any changes to her oxygen levels and no questions about her medications. She has been maintaining her oxygen saturations in the program and at home on two liters.    Goals/Expected Outcomes Short: Become more profiecient at using PLB.   Long: Become independent at using PLB. Short: practice PLB and diaphragmatic breathing at home. Long: Use PLB and diaphragmatic breathing independently post [employer]. Short: Continue to use her PLB to manage breathing Long: continued compliance Short: maintain PLB and oxygen post [employer]. Long: maintain PLB and oxygen saturation independently.             Oxygen Discharge (Final Oxygen Re-Evaluation):  Oxygen Re-Evaluation - 02/10/23 1547       Program Oxygen Prescription   Program Oxygen Prescription Continuous    Liters per minute 2      Home Oxygen   Home Oxygen Device Portable Concentrator;Home Concentrator    Sleep Oxygen Prescription Continuous    Liters per minute 2    Home Exercise Oxygen Prescription Continuous    Liters per minute 2    Home Resting Oxygen Prescription None    Liters per minute 0    Compliance with Home Oxygen Use Yes      Goals/Expected Outcomes   Short Term Goals Other    Long  Term Goals Other    Comments Sarinity does not have any changes to her oxygen levels and no questions about her medications. She has been maintaining her oxygen saturations in the program and at home on two liters.    Goals/Expected Outcomes Short: maintain PLB and oxygen post [employer]. Long: maintain PLB and oxygen saturation independently.             Initial Exercise Prescription:   Initial Exercise Prescription - 11/18/22 1700       Date of Initial Exercise RX and Referring Provider   Date 11/18/22    Referring Provider Meredeth Ide      Oxygen   Oxygen Continuous    Liters 2  Maintain Oxygen Saturation 88% or higher      Treadmill   MPH 0.8    Grade 0    Minutes 15    METs 1      Recumbant Bike   Level 1    RPM 50    Minutes 15    METs 1.46      NuStep   Level 1    SPM 80    Minutes 15    METs 1.46      REL-XR   Level 1    Speed 50    Minutes 15    METs 1.46      T5 Nustep   Level 1    SPM 80    Minutes 15    METs 2.3      Track   Laps 10    Minutes 15    METs 1.54      Prescription Details   Frequency (times per week) 3    Duration Progress to 30 minutes of continuous aerobic without signs/symptoms of physical distress      Intensity   THRR 40-80% of Max Heartrate 117-146    Perceived Dyspnea 0-4      Progression   Progression Continue to progress workloads to maintain intensity without signs/symptoms of physical distress.      Resistance Training   Training Prescription Yes    Weight 2    Reps 10-15             Perform Capillary Blood Glucose checks as needed.  Exercise Prescription Changes:   Exercise Prescription Changes     Row Name 11/18/22 1700 11/25/22 1100 12/10/22 1300 12/23/22 1200 01/06/23 1400     Response to Exercise   Blood Pressure (Admit) 138/84 116/58 118/60 118/60 104/60   Blood Pressure (Exercise) 146/72 128/64 144/80 -- --   Blood Pressure (Exit) 122/60 110/58 110/60 120/62 110/58   Heart Rate (Admit) 89 bpm 89 bpm 84 bpm 80 bpm 76 bpm   Heart Rate (Exercise) 104 bpm 98 bpm 96 bpm 100 bpm 100 bpm   Heart Rate (Exit) 98 bpm 85 bpm 89 bpm 93 bpm 94 bpm   Oxygen Saturation (Admit) 93 % 95 % 98 % 98 % 98 %   Oxygen Saturation (Exercise) 94 % 89 % 92 % 94 % 88 %   Oxygen Saturation (Exit) 98 % 94 % 96 % 98 % 97 %   Rating of Perceived Exertion (Exercise) Perceived Dyspnea  (Exercise) Symptoms SOB SOB SOB SOB SOB   Comments 6 MWT results 1st full day of exercise -- -- --   Duration -- Progress to 30 minutes of  aerobic without signs/symptoms of physical distress Progress to 30 minutes of  aerobic without signs/symptoms of physical distress Continue with 30 min of aerobic exercise without signs/symptoms of physical distress. Continue with 30 min of aerobic exercise without signs/symptoms of physical distress.   Intensity -- THRR unchanged THRR unchanged THRR unchanged THRR unchanged     Progression   Progression -- Continue to progress workloads to maintain intensity without signs/symptoms of physical distress. Continue to progress workloads to maintain intensity without signs/symptoms of physical distress. Continue to progress workloads to maintain intensity without signs/symptoms of physical distress. Continue to progress workloads to maintain intensity without signs/symptoms of physical distress.   Average METs -- 1.69 2.12 2.43 2.29     Resistance Training  Training Prescription -- Yes Yes Yes Yes   Weight -- 2 lb 2 lb 2 lb 2 lb   Reps -- 10-15 10-15 10-15 10-15     Interval Training   Interval Training -- No No No No     Oxygen   Oxygen -- Continuous Continuous Continuous Continuous   Liters -- 2 2 2 2      Treadmill   MPH -- 0.9 1.2 1.8 1.8   Grade -- 0 0 0.5 0.5   Minutes -- 15 15 15 15    METs -- 1.7 1.92 2.5 2.5     Recumbant Bike   Level -- -- 2 3 3    Watts -- -- -- 22 25   Minutes -- -- 15 15 15    METs -- -- 2.91 2.88 --     NuStep   Level -- 2 3 -- 3   Minutes -- 15 15 15 15    METs -- 1.7 2.4 2.3 2.7     REL-XR   Level -- -- -- 1 3   Minutes -- -- -- 15 15   METs -- -- -- 2.9 --     T5 Nustep   Level -- -- 2 2 2    Minutes -- -- 15 15 15    METs -- -- 1.8 1.8 1.8     Oxygen   Maintain Oxygen Saturation -- -- 88% or higher 88% or higher 88% or higher    Row Name 01/15/23 1500 01/20/23 1100 01/20/23 1200 02/04/23  1500 02/17/23 1400     Response to Exercise   Blood Pressure (Admit) -- 140/70 -- 118/66 110/62   Blood Pressure (Exercise) -- 136/72 -- -- --   Blood Pressure (Exit) -- 112/62 -- 96/60 102/60   Heart Rate (Admit) -- 80 bpm -- 98 bpm 83 bpm   Heart Rate (Exercise) -- 108 bpm -- 119 bpm 109 bpm   Heart Rate (Exit) -- 101 bpm -- 101 bpm 91 bpm   Oxygen Saturation (Admit) -- 96 % -- 98 % 99 %   Oxygen Saturation (Exercise) -- 94 % -- 91 % 95 %   Oxygen Saturation (Exit) -- 96 % -- 98 % 98 %   Rating of Perceived Exertion (Exercise) -- 13 -- 12 12   Perceived Dyspnea (Exercise) -- 3 -- 3 3   Symptoms -- SOB -- SOB SOB   Comments -- return from surgery -- -- --   Duration -- Continue with 30 min of aerobic exercise without signs/symptoms of physical distress. -- Continue with 30 min of aerobic exercise without signs/symptoms of physical distress. Continue with 30 min of aerobic exercise without signs/symptoms of physical distress.   Intensity -- THRR unchanged -- THRR unchanged THRR unchanged     Progression   Progression -- Continue to progress workloads to maintain intensity without signs/symptoms of physical distress. -- Continue to progress workloads to maintain intensity without signs/symptoms of physical distress. Continue to progress workloads to maintain intensity without signs/symptoms of physical distress.   Average METs -- 2.1 -- 2.39 2.49     Resistance Training   Training Prescription -- Yes -- Yes Yes   Weight -- 2 lb -- 2 lb 2 lb   Reps -- 10-15 -- 10-15 10-15     Interval Training   Interval Training -- No -- No No     Oxygen   Oxygen -- Continuous -- Continuous Continuous   Liters -- 2 -- 2 2     Treadmill  MPH -- -- 1.4 1.5 1.5   Grade -- -- 0.5 0 0   Minutes -- -- 15 15 15    METs -- -- 2.17 2.15 2.15     Recumbant Bike   Level -- -- 3 -- --   Watts -- -- 13 -- --   Minutes -- -- 15 -- --   METs -- -- 2.45 -- --     NuStep   Level -- -- -- 4 4   Minutes  -- -- -- 15 15   METs -- -- -- 2.9 3     Recumbant Elliptical   Level -- -- -- -- 1   Minutes -- -- -- -- 15   METs -- -- -- -- 1.8     REL-XR   Level -- -- -- 4 4   Minutes -- -- -- 15 15   METs -- -- -- 3.7 4.1     T5 Nustep   Level -- -- 2 1 2    Minutes -- -- 15 15 15    METs -- -- 1.8 1.8 2.1     Home Exercise Plan   Plans to continue exercise at Home (comment)  Walking at home, 2 lb hand weights -- Home (comment)  Walking at home, 2 lb hand weights Home (comment)  Walking at home, 2 lb hand weights Home (comment)  Walking at home, 2 lb hand weights   Frequency Add 2 additional days to program exercise sessions. -- Add 2 additional days to program exercise sessions. Add 2 additional days to program exercise sessions. Add 2 additional days to program exercise sessions.   Initial Home Exercises Provided 01/15/23 -- 01/15/23 01/15/23 01/15/23     Oxygen   Maintain Oxygen Saturation 88% or higher -- 88% or higher 88% or higher 88% or higher            Exercise Comments:   Exercise Comments     Row Name 11/21/22 1606           Exercise Comments First full day of exercise!  Patient was oriented to gym and equipment including functions, settings, policies, and procedures.  Patient's individual exercise prescription and treatment plan were reviewed.  All starting workloads were established based on the results of the 6 minute walk test done at initial orientation visit.  The plan for exercise progression was also introduced and progression will be customized based on patient's performance and goals.                Exercise Goals and Review:   Exercise Goals     Row Name 11/18/22 1722             Exercise Goals   Increase Physical Activity Yes       Intervention Provide advice, education, support and counseling about physical activity/exercise needs.;Develop an individualized exercise prescription for aerobic and resistive training based on initial evaluation  findings, risk stratification, comorbidities and participant's personal goals.       Expected Outcomes Short Term: Attend rehab on a regular basis to increase amount of physical activity.;Long Term: Add in home exercise to make exercise part of routine and to increase amount of physical activity.;Long Term: Exercising regularly at least 3-5 days a week.       Increase Strength and Stamina Yes       Intervention Provide advice, education, support and counseling about physical activity/exercise needs.;Develop an individualized exercise prescription for aerobic and resistive training based on initial evaluation findings,  risk stratification, comorbidities and participant's personal goals.       Expected Outcomes Short Term: Increase workloads from initial exercise prescription for resistance, speed, and METs.;Short Term: Perform resistance training exercises routinely during rehab and add in resistance training at home;Long Term: Improve cardiorespiratory fitness, muscular endurance and strength as measured by increased METs and functional capacity ( )       Able to understand and use rate of perceived exertion (RPE) scale Yes       Intervention Provide education and explanation on how to use RPE scale       Expected Outcomes Short Term: Able to use RPE daily in rehab to express subjective intensity level;Long Term:  Able to use RPE to guide intensity level when exercising independently       Able to understand and use Dyspnea scale Yes       Intervention Provide education and explanation on how to use Dyspnea scale       Expected Outcomes Short Term: Able to use Dyspnea scale daily in rehab to express subjective sense of shortness of breath during exertion;Long Term: Able to use Dyspnea scale to guide intensity level when exercising independently       Knowledge and understanding of Target Heart Rate Range (THRR) Yes       Intervention Provide education and explanation of THRR including how the numbers  were predicted and where they are located for reference       Expected Outcomes Short Term: Able to state/look up THRR;Long Term: Able to use THRR to govern intensity when exercising independently;Short Term: Able to use daily as guideline for intensity in rehab       Able to check pulse independently Yes       Intervention Provide education and demonstration on how to check pulse in carotid and radial arteries.;Review the importance of being able to check your own pulse for safety during independent exercise       Expected Outcomes Short Term: Able to explain why pulse checking is important during independent exercise       Understanding of Exercise Prescription Yes       Intervention Provide education, explanation, and written materials on patient's individual exercise prescription       Expected Outcomes Short Term: Able to explain program exercise prescription;Long Term: Able to explain home exercise prescription to exercise independently                Exercise Goals Re-Evaluation :  Exercise Goals Re-Evaluation     Row Name 11/21/22 1607 11/25/22 1123 12/10/22 1400 12/23/22 1211 01/06/23 1419     Exercise Goal Re-Evaluation   Exercise Goals Review Increase Physical Activity;Able to understand and use rate of perceived exertion (RPE) scale;Knowledge and understanding of Target Heart Rate Range (THRR);Understanding of Exercise Prescription;Increase Strength and Stamina;Able to check pulse independently;Able to understand and use Dyspnea scale Increase Physical Activity;Increase Strength and Stamina;Understanding of Exercise Prescription Increase Physical Activity;Increase Strength and Stamina;Understanding of Exercise Prescription Increase Physical Activity;Increase Strength and Stamina;Understanding of Exercise Prescription Increase Physical Activity;Increase Strength and Stamina;Understanding of Exercise Prescription   Comments Reviewed RPE scale, THR and program prescription with pt  today.  Pt voiced understanding and was given a copy of goals to take home. Navah is off to a good start with rehab for her first session. She was able to work at her initial exercise prescription and even went up to level 2 on the T4 Nustep.  RPEs were appropriate. We will continue to  monitor as she progresses in the program. Kenzee is doing well in the program. She increased her overall average MET level to 2.12 METs. She also improved to level 2 on the T5 Nustep and level 3 on the T4 Nustep. She also increased her walking speed on the treadmill to 1.2 mph. We will continue to monitor her progress in the program. Kaliope continues to do well in rehab. She increased her treadmill speed to 1.8 mph and added a 0.5% incline. She also increased to level 3 on the recumbent bike and worked up to 22 watts! Her oxygen saturation is staying above 88%. We will continue to monitor her progression. Permelia is doing well in rehab. She has continued to walk on the treadmill at a speed of 1.8 mph and an incline of 0.5%. However, she has had to pause while walking due to SOB. She also improved to level 3 on the XR. She has continued to work at a MET level above 2 METs as well. We will continue to monitor her progress in the program.   Expected Outcomes Short: Use RPE daily to regulate intensity.  Long: Follow program prescription in THR. Short: Continue to follow exercise prescription Long: Progressively increase overall MET level and stamina Short: Continue to increase treadmill workload. Long: Continue to improve strength and stamina. Short: Continue to increase treadmill speed Long: Continue to increase overall MET level and stamina Short: Continue to progressively increase treadmill workload. Long: Continue to improve strength and stamina.    Row Name 01/15/23 1550 01/20/23 1202 01/20/23 1552 02/04/23 1525 02/10/23 1544     Exercise Goal Re-Evaluation   Exercise Goals Review Increase Physical Activity;Able to understand and use  Dyspnea scale;Understanding of Exercise Prescription;Increase Strength and Stamina;Knowledge and understanding of Target Heart Rate Range (THRR);Able to check pulse independently;Able to understand and use rate of perceived exertion (RPE) scale Increase Physical Activity;Increase Strength and Stamina;Understanding of Exercise Prescription Increase Physical Activity;Increase Strength and Stamina;Understanding of Exercise Prescription Increase Physical Activity;Increase Strength and Stamina;Understanding of Exercise Prescription Increase Physical Activity;Increase Strength and Stamina   Comments Reviewed home exercise with pt today.  Pt plans to walk at home for exercise and use 2 lb hand weights for resistance training.  Reviewed THR, pulse, RPE, sign and symptoms, pulse oximetery and when to call 911 or MD.  Also discussed weather considerations and indoor options.  Pt voiced understanding. Lan was out for surgery but returned back for a couple sessions. She is easing back into exercise and will encourage her to increase her workloads once she is back into rhythm. Her oxygen saturations are staying above 88% and we hope to see her watts on the recumbent bike increase. Will continue to monitor. Daneisha is doing well.  She is feel better and recovering from surgery.  She is staying active again.  She is using hand weights and walking at home on her off days.  She was feelng better with her stamina prior to surgery but now feels like she is restarting again. Faun is doing well in rehab since returning back to the program after having surgery. She was able to increase her walking speed on the treadmill back up to 1.5 mph with no incline. She also was able to improve to level 4 on both the T4 nustep and the XR. She has continued to use 2 lb hand weights for resistance training as well. We will continue to monitor her progress in the program. Galen states that she does not know where  she wants to exercise after [facility].  She has been doing well in the program and trying to push herself. She may walk at the mall or at home.   Expected Outcomes Short: Continue to walk at home on days away from rehab. Long: Continue to work towards independence with exercise. Short: Increase watts on recumbant bike LonG: Continue to increase overall MET level and stamina Short; Get back to routine exercise again Long: Conitnue to rebuild stamina Short: Continue to increase workloads back up to levels prior to surgery. Long: Continue to improve strength and stamina. Short: exerciser post [facility]. Long: maintain an exercise routine independently.    Row Name 02/17/23 1441             Exercise Goal Re-Evaluation   Exercise Goals Review Increase Physical Activity;Increase Strength and Stamina;Understanding of Exercise Prescription       Comments Julieta continues to do well in rehab. she completed her post and improved by almost 70%!! Her oxygen stays well above 88%. She has stayed pretty consistent at level 4 on the XR and T4Nustep as well as maintaining a speed of 1.5 mph on the treadmill. She could benefit from increasing to 3 lb for handweights. We will continue to monitor until she graduates.       Expected Outcomes Short: Graduate Long: Continue to increase overall MET level and stamina                Discharge Exercise Prescription (Final Exercise Prescription Changes):  Exercise Prescription Changes - 02/17/23 1400       Response to Exercise   Blood Pressure (Admit) 110/62    Blood Pressure (Exit) 102/60    Heart Rate (Admit) 83 bpm    Heart Rate (Exercise) 109 bpm    Heart Rate (Exit) 91 bpm    Oxygen Saturation (Admit) 99 %    Oxygen Saturation (Exercise) 95 %    Oxygen Saturation (Exit) 98 %    Rating of Perceived Exertion (Exercise) 12    Perceived Dyspnea (Exercise) 3    Symptoms SOB    Duration Continue with 30 min of aerobic exercise without signs/symptoms of physical distress.    Intensity THRR  unchanged      Progression   Progression Continue to progress workloads to maintain intensity without signs/symptoms of physical distress.    Average METs 2.49      Resistance Training   Training Prescription Yes    Weight 2 lb    Reps 10-15      Interval Training   Interval Training No      Oxygen   Oxygen Continuous    Liters 2      Treadmill   MPH 1.5    Grade 0    Minutes 15    METs 2.15      NuStep   Level 4    Minutes 15    METs 3      Recumbant Elliptical   Level 1    Minutes 15    METs 1.8      REL-XR   Level 4    Minutes 15    METs 4.1      T5 Nustep   Level 2    Minutes 15    METs 2.1      Home Exercise Plan   Plans to continue exercise at Home (comment)   Walking at home, 2 lb hand weights   Frequency Add 2 additional days to program exercise sessions.  Initial Home Exercises Provided 01/15/23      Oxygen   Maintain Oxygen Saturation 88% or higher             Nutrition:  Target Goals: Understanding of nutrition guidelines, daily intake of sodium 1500mg , cholesterol 200mg , calories 30% from fat and 7% or less from saturated fats, daily to have 5 or more servings of fruits and vegetables.  Education: All About Nutrition: -Group instruction provided by verbal, written material, interactive activities, discussions, models, and posters to present general guidelines for heart healthy nutrition including fat, fiber, MyPlate, the role of sodium in heart healthy nutrition, utilization of the nutrition label, and utilization of this knowledge for meal planning. Follow up email sent as well. Written material given at graduation. Flowsheet Row Pulmonary Rehab from 01/29/2023 in [facility]  Education need identified 11/18/22       Biometrics:  Pre Biometrics - 11/18/22 1722       Pre Biometrics   Height 5' 7.5" (1.715 m)    Weight 190 lb 4.8 oz (86.3 kg)    Waist Circumference 40 inches    Hip Circumference 44 inches     Waist to Hip Ratio 0.91 %    BMI (Calculated) 29.35    Single Leg Stand 2.66 seconds             Post Biometrics - 02/12/23 1604        Post  Biometrics   Height 5' 7.5" (1.715 m)    Weight 185 lb 6.4 oz (84.1 kg)    Waist Circumference 41 inches    Hip Circumference 42 inches    Waist to Hip Ratio 0.98 %    BMI (Calculated) 28.59    Single Leg Stand 3.06 seconds             Nutrition Therapy Plan and Nutrition Goals:  Nutrition Therapy & Goals - 02/24/23 1532       Nutrition Therapy   Diet Heart healthy, low Na, pulmonary MNT    Drug/Food Interactions Coumadin/Vit K    Protein (specify units) 100g    Fiber 25 grams    Whole Grain Foods 3 servings    Saturated Fats 16 max. grams    Fruits and Vegetables 8 servings/day    Sodium 1.5 grams      Personal Nutrition Goals   Nutrition Goal ST: practice MyPlate guidelines, mix pasta with non-starchy vegetables and lean protein, swap to whole grain bread LT: include 20-25g of fiber/day, limit Na <1.5g/day, include at least 5 fruit/vegetable servings per day    Comments 62 y.o. F admitted to pulmonary rehab for stg 3 COPD. PMHx includes OSA, hypothyroidism, anxiety/depression, HLD. Medications reviewed duoneb, trelegy, cymbalta, furosemide, zofran, warfarin. PYP 41. B: boiled eggs L: sandwich (white bread - [place] and mayo) and some chips or leftovers D: variety (pre-prepared or take out/restaurants) - examples include salad, pasta, bowl of soup from [employer], frozen vegetables. S: does not usually snack Drinks: diet pepsi. Daysia reports that her weight has been stable- surgery this past November when her appetite has been decreased previously and this has made it worse; she went from 216 to180 lbs  (she reports 16 lbs in 4 weeks and it slowed down after that). Discussed general healthy eating incluing types of fat, fiber, and healthy eating patterns including MyPlate. Discussed pulmonary MNT and vitamin K/warfarin MNT. Enedina would  like to limit her pasta intake.      Intervention Plan  Intervention Prescribe, educate and counsel regarding individualized specific dietary modifications aiming towards targeted core components such as weight, hypertension, lipid management, diabetes, heart failure and other comorbidities.    Expected Outcomes Short Term Goal: Understand basic principles of dietary content, such as calories, fat, sodium, cholesterol and nutrients.;Short Term Goal: A plan has been developed with personal nutrition goals set during dietitian appointment.;Long Term Goal: Adherence to prescribed nutrition plan.             Nutrition Assessments:  MEDIFICTS Score Key: ?70 Need to make dietary changes  40-70 Heart Healthy Diet ? 40 Therapeutic Level Cholesterol Diet  Flowsheet Row Pulmonary Rehab from 02/13/2023 in [facility] Card[facility] Pulmonary Rehab  Picture Your Plate Total Score on Discharge 56      Picture Your Plate Scores: <56 Unhealthy dietary pattern with much room for improvement. 41-50 Dietary pattern unlikely to meet recommendations for good health and room for improvement. 51-60 More healthful dietary pattern, with some room for improvement.  >60 Healthy dietary pattern, although there may be some specific behaviors that could be improved.   Nutrition Goals Re-Evaluation:  Nutrition Goals Re-Evaluation     Row Name 11/28/22 1604 12/12/22 1556 01/20/23 1558 02/10/23 1553       Goals   Current Weight 189 lb (85.7 kg) -- -- 185 lb (83.9 kg)    Nutrition Goal Manage Portion sizes Reduce diet soda intake Short: reduce soda intake. Long: adhere to a diet that pertains to her needs. Eat less pasta.    Comment Patient was informed on why it is important to maintain a balanced diet when dealing with Respiratory issues. Explained that it takes a lot of energy to breath and when they are short of breath often they will need to have a good diet to help keep up with the calories they are expending for  breathing. Patient states she is drinking more soda that she wants to. She also drinks Mio mixed with water. She wants to try to lose more weight. She does not over eat. Alida is doing well with her diet.  She is not gaining weight, so she feels she is doing okay. She is doing okay with her soda intake.  Some days she is able to go without, other days she has two or more.  She is still trying to add in more protein to her diet.  She has a protein shake each day. Kenlyn wants to lose more weight but she cant seem to lose any. She states she eats alot of pasta and that could be the cause.    Expected Outcome Short: Choose and plan snacks accordingly to patients caloric intake to improve breathing. Long: Maintain a diet independently that meets their caloric intake to aid in daily shortness of breath. Short: reduce soda intake. Long: adhere to a diet that pertains to her needs. Short: Continue to work on soda Long: Conitnue to add in more protein Short: lose a few pounds in the next few weeks. Long: maintain losing weight after [facility].             Nutrition Goals Discharge (Final Nutrition Goals Re-Evaluation):  Nutrition Goals Re-Evaluation - 02/10/23 1553       Goals   Current Weight 185 lb (83.9 kg)    Nutrition Goal Eat less pasta.    Comment Ruthella wants to lose more weight but she cant seem to lose any. She states she eats alot of pasta and that could be the cause.  Expected Outcome Short: lose a few pounds in the next few weeks. Long: maintain losing weight after [facility].             Psychosocial: Target Goals: Acknowledge presence or absence of significant depression and/or stress, maximize coping skills, provide positive support system. Participant is able to verbalize types and ability to use techniques and skills needed for reducing stress and depression.   Education: Stress, Anxiety, and Depression - Group verbal and visual presentation to define topics covered.  Reviews how  body is impacted by stress, anxiety, and depression.  Also discusses healthy ways to reduce stress and to treat/manage anxiety and depression.  Written material given at graduation. Flowsheet Row Pulmonary Rehab from 01/29/2023 in [facility] Card[facility] and Pulmonary Rehab  Date 01/22/23  Educator KW  Instruction Review Code 1- [employer] Understanding       Education: Sleep Hygiene -Provides group verbal and written instruction about how sleep can affect your health.  Define sleep hygiene, discuss sleep cycles and impact of sleep habits. Review good sleep hygiene tips.    Initial Review & Psychosocial Screening:  Initial Psych Review & Screening - 11/07/22 1437       Initial Review   Current issues with Current Depression      Family Dynamics   Good Support System? Yes   sister, family     Screening Interventions   Interventions Encouraged to exercise;Provide feedback about the scores to participant    Expected Outcomes Short Term goal: Utilizing psychosocial counselor, staff and physician to assist with identification of specific Stressors or current issues interfering with healing process. Setting desired goal for each stressor or current issue identified.;Long Term Goal: Stressors or current issues are controlled or eliminated.;Short Term goal: Identification and review with participant of any Quality of Life or Depression concerns found by scoring the questionnaire.;Long Term goal: The participant improves quality of Life and PHQ9 Scores as seen by post scores and/or verbalization of changes             Quality of Life Scores:  Scores of 19 and below usually indicate a poorer quality of life in these areas.  A difference of  2-3 points is a clinically meaningful difference.  A difference of 2-3 points in the total score of the Quality of Life Index has been associated with significant improvement in overall quality of life, self-image, physical symptoms, and general health in studies  assessing change in quality of life.  PHQ-9: Review Flowsheet  More data exists      02/13/2023 01/28/2023 11/18/2022 09/21/2021 09/18/2020  Depression screen PHQ 2/9  Decreased Interest 1 0 1 0 0  Down, Depressed, Hopeless 0 0 0 0 0  PHQ - 2 Score 1 0 1 0 0  Altered sleeping 0 0 0 2 -  Tired, decreased energy -  Change in appetite 0 0 0 0 -  Feeling bad or failure about yourself  0 0 0 0 -  Trouble concentrating 0 0 0 0 -  Moving slowly or fidgety/restless 0 0 0 0 -  Suicidal thoughts 0 0 0 0 -  PHQ-9 Score -  Difficult doing work/chores Somewhat difficult Not difficult at all - Not difficult at all -   Interpretation of Total Score  Total Score Depression Severity:  1-4 = Minimal depression, 5-9 = Mild depression, 10-14 = Moderate depression, 15-19 = Moderately severe depression, 20-27 = Severe depression   Psychosocial Evaluation and Intervention:  Psychosocial Evaluation - 11/07/22 1442       Psychosocial Evaluation & Interventions   Interventions Encouraged to exercise with the program and follow exercise prescription    Comments Militza is coming to pulmonary rehab for COPD. She is on cymbalta and reports it is working well. 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 She recently got a concentrator but it was unclear over the phone as to her oxygen use. She reports feeling like she is managing her health well independenlty and also has a great support system of family close by.    Expected Outcomes Short: attend pulmonary rehab for education and exercise. Long: develop and maintain positive self care habits.    Continue Psychosocial Services  Follow up required by staff             Psychosocial Re-Evaluation:  Psychosocial Re-Evaluation     Row Name 11/28/22 1605 12/12/22 1601 01/20/23 1555 02/10/23 1555       Psychosocial Re-Evaluation   Current issues with Current Psychotropic Meds;Current Depression;Current Anxiety/Panic  Current Psychotropic Meds;Current Depression;Current Anxiety/Panic Current Psychotropic Meds;Current Depression;Current Anxiety/Panic Current Psychotropic Meds;Current Depression;Current Anxiety/Panic    Comments Her depression stems from her health. Her balance was off and when she was sitting she would feel like the room was spinning. She would feel anxious last January when all of this started. She has been okay in the last couple of weeks and doing well. She has more procedures coming up and is going to stay on Cymbalta to help her mood. 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 Patient reports no issues with their current mental states, sleep, stress, depression or anxiety. Will follow up with patient in a few weeks for any changes. She says her mood is about the same as it was and takes her Celabrex as prescribed. Charlea is doing well in rehab.  She is feeling pretty good mentally.  She ran out of celebrex for a couple weeks and has just restarted it.  She is feeling okay with it now.  She has not had any depressions symptoms recently.  She is sleeping pretty well.  She is frustrated that surgery set her back wit her recovery, but she is fighting each day to go forward. Rikia states that she has no changes with her mental sate since the last review.    Expected Outcomes Short: continue medication for mood. Long: maintain medciation to keep mood stable. Short: Continue to exercise regularly to support mental health and notify staff of any changes. Long: maintain mental health and well being through teaching of rehab or prescribed medications independently. SHort: Continue to build stamina again to do more Long: Conitnue to stay positive Short: Continue to exercise regularly to support mental health and notify staff of any changes. Long: maintain mental health and well being through teaching of rehab or prescribed medications independently.    Interventions Encouraged to attend Pulmonary  Rehabilitation for the exercise Encouraged to attend Pulmonary Rehabilitation for the exercise Encouraged to attend Pulmonary Rehabilitation for the exercise Encouraged to attend Pulmonary Rehabilitation for the exercise    Continue Psychosocial Services  Follow up required by staff Follow up required by staff Follow up required by staff No Follow up required             Psychosocial Discharge (Final Psychosocial Re-Evaluation):  Psychosocial Re-Evaluation - 02/10/23 1555       Psychosocial Re-Evaluation   Current issues with  Current Psychotropic Meds;Current Depression;Current Anxiety/Panic    Comments Debby states that she has no changes with her mental sate since the last review.    Expected Outcomes Short: Continue to exercise regularly to support mental health and notify staff of any changes. Long: maintain mental health and well being through teaching of rehab or prescribed medications independently.    Interventions Encouraged to attend Pulmonary Rehabilitation for the exercise    Continue Psychosocial Services  No Follow up required             Education: Education Goals: Education classes will be provided on a weekly basis, covering required topics. Participant will state understanding/return demonstration of topics presented.  Learning Barriers/Preferences:  Learning Barriers/Preferences - 11/07/22 1453       Learning Barriers/Preferences   Learning Barriers None    Learning Preferences Individual Instruction             General Pulmonary Education Topics:  Infection Prevention: - Provides verbal and written material to individual with discussion of infection control including proper hand washing and proper equipment cleaning during exercise session. Flowsheet Row Pulmonary Rehab from 01/29/2023 in [facility] Card[facility] and Pulmonary Rehab  Date 11/18/22  Educator [facility]  Instruction Review Code 1- Verbalizes Understanding       Falls Prevention: - Provides verbal  and written material to individual with discussion of falls prevention and safety. Flowsheet Row Pulmonary Rehab from 01/29/2023 in [facility] Cardiac and Pulmonary Rehab  Date 11/18/22  Educator [facility]  Instruction Review Code 1- Verbalizes Understanding       Chronic Lung Disease Review: - Group verbal instruction with posters, models, PowerPoint presentations and videos,  to review new updates, new respiratory medications, new advancements in procedures and treatments. Providing information on websites and "800" numbers for continued self-education. Includes information about supplement oxygen, available portable oxygen systems, continuous and intermittent flow rates, oxygen safety, concentrators, and Medicare reimbursement for oxygen. Explanation of Pulmonary Drugs, including class, frequency, complications, importance of spacers, rinsing mouth after steroid MDI's, and proper cleaning methods for nebulizers. Review of basic lung anatomy and physiology related to function, structure, and complications of lung disease. Review of risk factors. Discussion about methods for diagnosing sleep apnea and types of masks and machines for OSA. Includes a review of the use of types of environmental controls: home humidity, furnaces, filters, dust mite/pet prevention, HEPA vacuums. Discussion about weather changes, air quality and the benefits of nasal washing. Instruction on Warning signs, infection symptoms, calling MD promptly, preventive modes, and value of vaccinations. Review of effective airway clearance, coughing and/or vibration techniques. Emphasizing that all should Create an Action Plan. Written material given at graduation. Flowsheet Row Pulmonary Rehab from 01/29/2023 in [facility] Cardiac and Pulmonary Rehab  Education need identified 11/18/22  Date 01/15/23  Educator [facility]  Instruction Review Code 1- Verbalizes Understanding       AED/CPR: - Group verbal and written instruction with the use of models to  demonstrate the basic use of the AED with the basic ABC's of resuscitation.    Anatomy and Cardiac Procedures: - Group verbal and visual presentation and models provide information about basic cardiac anatomy and function. Reviews the testing methods done to diagnose heart disease and the outcomes of the test results. Describes the treatment choices: Medical Management, Angioplasty, or Coronary Bypass Surgery for treating various heart conditions including Myocardial Infarction, Angina, Valve Disease, and Cardiac Arrhythmias.  Written material given at graduation. Flowsheet Row Pulmonary Rehab from 01/29/2023 in [facility] Cardiac and Pulmonary  Rehab  Date 12/18/22  Educator SB  Instruction Review Code 1- Verbalizes Understanding       Medication Safety: - Group verbal and visual instruction to review commonly prescribed medications for heart and lung disease. Reviews the medication, class of the drug, and side effects. Includes the steps to properly store meds and maintain the prescription regimen.  Written material given at graduation.   Other: -Provides group and verbal instruction on various topics (see comments) Flowsheet Row Pulmonary Rehab from 01/29/2023 in [facility] Cardiac and Pulmonary Rehab  Date 12/25/22  Educator SB  Instruction Review Code 1- Verbalizes Understanding  [Jeopardy]       Knowledge Questionnaire Score:  Knowledge Questionnaire Score - 02/13/23 1600       Knowledge Questionnaire Score   Pre Score 16/18    Post Score 17/18              Core Components/Risk Factors/Patient Goals at Admission:  Personal Goals and Risk Factors at Admission - 11/18/22 1723       Core Components/Risk Factors/Patient Goals on Admission    Weight Management Yes    Intervention Weight Management: Develop a combined nutrition and exercise program designed to reach desired caloric intake, while maintaining appropriate intake of nutrient and fiber, sodium and fats, and appropriate  energy expenditure required for the weight goal.;Weight Management: Provide education and appropriate resources to help participant work on and attain dietary goals.;Weight Management/Obesity: Establish reasonable short term and long term weight goals.    Admit Weight 190 lb 4.8 oz (86.3 kg)    Goal Weight: Short Term 185 lb (83.9 kg)    Goal Weight: Long Term 180 lb (81.6 kg)    Expected Outcomes Short Term: Continue to assess and modify interventions until short term weight is achieved;Long Term: Adherence to nutrition and physical activity/exercise program aimed toward attainment of established weight goal;Weight Loss: Understanding of general recommendations for a balanced deficit meal plan, which promotes 1-2 lb weight loss per week and includes a negative energy balance of [phone] kcal/d;Understanding recommendations for meals to include 15-35% energy as protein, 25-35% energy from fat, 35-60% energy from carbohydrates, less than 200mg  of dietary cholesterol, 20-35 gm of total fiber daily;Understanding of distribution of calorie intake throughout the day with the consumption of 4-5 meals/snacks    Hypertension Yes    Intervention Provide education on lifestyle modifcations including regular physical activity/exercise, weight management, moderate sodium restriction and increased consumption of fresh fruit, vegetables, and low fat dairy, alcohol moderation, and smoking cessation.;Monitor prescription use compliance.    Expected Outcomes Short Term: Continued assessment and intervention until BP is < 140/53mm HG in hypertensive participants. < 130/29mm HG in hypertensive participants with diabetes, heart failure or chronic kidney disease.;Long Term: Maintenance of blood pressure at goal levels.             Education:Diabetes - Individual verbal and written instruction to review signs/symptoms of diabetes, desired ranges of glucose level fasting, after meals and with exercise. Acknowledge that pre  and post exercise glucose checks will be done for 3 sessions at entry of program.   Know Your Numbers and Heart Failure: - Group verbal and visual instruction to discuss disease risk factors for cardiac and pulmonary disease and treatment options.  Reviews associated critical values for Overweight/Obesity, Hypertension, Cholesterol, and Diabetes.  Discusses basics of heart failure: signs/symptoms and treatments.  Introduces Heart Failure Zone chart for action plan for heart failure.  Written material given at graduation.   Core Components/Risk  Factors/Patient Goals Review:   Goals and Risk Factor Review     Row Name 11/28/22 1603 12/12/22 1559 01/20/23 1600 02/10/23 1550       Core Components/Risk Factors/Patient Goals Review   Personal Goals Review Improve shortness of breath with ADL's Weight Management/Obesity Weight Management/Obesity;Improve shortness of breath with ADL's;Increase knowledge of respiratory medications and ability to use respiratory devices properly.;Hypertension Weight Management/Obesity    Review Spoke to patient about their shortness of breath and what they can do to improve. Patient has been informed of breathing techniques when starting the program. Patient is informed to tell staff if they have had any med changes and that certain meds they are taking or not taking can be causing shortness of breath. Nevaeh wants to lose around 25 pounds to help with her breathing. She does not think she can do it but assured her that if her diet is right and she keeps exercising she can do it. Myrna is back in rehab.  Her weight has been holding steady and she feels pretty good with it.  Her breathing has not gotten much better and she is using her inhalers and nebulizers at home.  Her blood pressures are doing well for most part but on occassion will run high. Hurley would like to lose more weight. She states she is stuck at 185 pounds. She states her shortness of breath has not got any better.     Expected Outcomes Short: Attend [facility] regularly to improve shortness of breath with ADL's. Long: maintain independence with ADL's Short: lose a few pounds in the next few weeks. Long: Reach 25 pounds weight loss. Short: Continue  to work on breathing LonG: continue to work on weight loss. Short: work on [employer] a few pounds. Long: continue working on weight loss independently.             Core Components/Risk Factors/Patient Goals at Discharge (Final Review):   Goals and Risk Factor Review - 02/10/23 1550       Core Components/Risk Factors/Patient Goals Review   Personal Goals Review Weight Management/Obesity    Review Ritaj would like to lose more weight. She states she is stuck at 185 pounds. She states her shortness of breath has not got any better.    Expected Outcomes Short: work on loseing a few pounds. Long: continue working on weight loss independently.             ITP Comments:  ITP Comments     Row Name 11/07/22 1442 11/18/22 1708 11/21/22 1606 12/04/22 1052 01/01/23 1507   ITP Comments Initial phone call completed. Diagnosis can be found in [facility] 11/28. EP Orientation scheduled for Monday 1/8 at 3pm. Completed and gym orientation. Initial ITP created and sent for review to Dr. Jinny Sanders, Medical Director. First full day of exercise!  Patient was oriented to gym and equipment including functions, settings, policies, and procedures.  Patient's individual exercise prescription and treatment plan were reviewed.  All starting workloads were established based on the results of the 6 minute walk test done at initial orientation visit.  The plan for exercise progression was also introduced and progression will be customized based on patient's performance and goals. 30 Day review completed. Medical Director ITP review done, changes made as directed, and signed approval by Medical Director.   new to propgram 30 day review completed. ITP sent to Dr. Jinny Sanders, Medical  Director of  Pulmonary Rehab. Continue with ITP unless changes are made by physician.  Row Name 01/13/23 1058 01/29/23 0937 02/26/23 1429       ITP Comments Brenee has been out of rehab due to surgery for descending thoracic aortic aneurysm at [facility] on 01/09/23. 30 Day review completed. Medical Director ITP review done, changes made as directed, and signed approval by Medical Director. 30 day review completed. ITP sent to Dr. Jinny Sanders, Medical Director of  Pulmonary Rehab. Continue with ITP unless changes are made by physician.              Comments: 30 day review

## 2023-02-26 NOTE — Progress Notes (Signed)
Daily Session Note  Patient Details  Name: Nancy [name] MRN: [id number] Date of Birth: [birth year] Referring Provider:   Flowsheet Row Pulmonary Rehab from 11/18/2022 in [facility] Cardiac and Pulmonary Rehab  Referring Provider Meredeth Ide       Encounter Date: 02/26/2023  Check In:  Session Check In - 02/26/23 1552       Check-In   Supervising physician immediately available to respond to emergencies See telemetry face sheet for immediately available ER MD    Location [facility]-Cardiac & Pulmonary Rehab    Staff Present Susann Givens, RN Mabeline Caras, BS, ACSM CEP, Exercise Physiologist;Joseph Reino [place], [place]    Virtual Visit No    Medication changes reported     No    Fall or balance concerns reported    No    Warm-up and Cool-down Performed on first and last piece of equipment    Resistance Training Performed Yes    VAD Patient? No    PAD/SET Patient? No      Pain Assessment   Currently in Pain? No/denies                Social History   Tobacco Use  Smoking Status Former   Packs/day: 0.50   Years: 30.00   Additional pack years: 0.00   Total pack years: 15.00   Types: Cigarettes   Quit date: 03/11/2016   Years since quitting: 6.9  Smokeless Tobacco Never  Tobacco Comments   patient states  that she is working at her own pace to quit smoking    Goals Met:  Independence with exercise equipment Exercise tolerated well No report of concerns or symptoms today Strength training completed today  Goals Unmet:  Not Applicable  Comments:  Nancy [name] graduated today from  rehab with 36 sessions completed.  Details of the patient's exercise prescription and what She needs to do in order to continue the prescription and progress were discussed with patient.  Patient was given a copy of prescription and goals.  Patient verbalized understanding.  Nancy [name] plans to continue to exercise by walking.    Dr. Bethann Punches is Medical Director for [facility] Cardiac Rehabilitation.  Dr.  Vida Rigger is Medical Director for [facility] Pulmonary Rehabilitation.

## 2023-02-26 NOTE — Progress Notes (Signed)
Discharge Summary   Nancy [name] DOB: 09/24/1961  Nancy [name] graduated today from  rehab with 36 sessions completed.  Details of the patient's exercise prescription and what She needs to do in order to continue the prescription and progress were discussed with patient.  Patient was given a copy of prescription and goals.  Patient verbalized understanding.  Nancy [name] plans to continue to exercise by walking   6 Minute Walk     Row Name 11/18/22 1708 02/12/23 1600       6 Minute Walk   Phase Initial Discharge    Distance 580 feet 985 feet    Distance % Change -- 69.8 %    Distance Feet Change -- 505 ft    Walk Time 4.5 minutes 6 minutes    # of Rest Breaks 4 0    MPH 1.46 1.87    METS 2.3 2.83    RPE 14 11    Perceived Dyspnea  3 2    VO2 Peak 8.04 9.88    Symptoms Yes (comment)  SOB Yes (comment)    Comments -- SOB    Resting HR 89 bpm 83 bpm    Resting BP 138/84 104/60    Resting Oxygen Saturation  93 % 98 %    Exercise Oxygen Saturation  during 6 min walk 94 % 96 %    Max Ex. HR 104 bpm 100 bpm    Max Ex. BP 146/72 122/64    2 Minute Post BP 122/60 110/60      Interval HR   1 Minute HR 99 88    2 Minute HR 104 96    3 Minute HR 95 97    4 Minute HR 95 97    5 Minute HR 95 100    6 Minute HR 98 98    2 Minute Post HR 98 93    Interval Heart Rate? Yes Yes      Interval Oxygen   Interval Oxygen? Yes Yes    Baseline Oxygen Saturation % 93 % 98 %    1 Minute Oxygen Saturation % 99 % 98 %    1 Minute Liters of Oxygen 2 L 2 L    2 Minute Oxygen Saturation % 104 % 98 %    2 Minute Liters of Oxygen 2 L 2 L    3 Minute Oxygen Saturation % 95 % 98 %    3 Minute Liters of Oxygen 2 L 2 L    4 Minute Oxygen Saturation % 95 % 96 %    4 Minute Liters of Oxygen 2 L 2 L    5 Minute Oxygen Saturation % 95 % 97 %    5 Minute Liters of Oxygen 2 L 2 L    6 Minute Oxygen Saturation % 98 % 96 %    6 Minute Liters of Oxygen 2 L 2 L    2 Minute Post Oxygen Saturation % 98 % 98 %    2 Minute Post  Liters of Oxygen 2 L 2 L

## 2023-02-26 NOTE — Progress Notes (Signed)
Pulmonary Individual Treatment Plan  Patient Details  Name: Nancy [name] MRN: [id number] Date of Birth: 15-Feb-1961 Referring Provider:   Flowsheet Row Pulmonary Rehab from 11/18/2022 in [facility] Cardiac and Pulmonary Rehab  Referring Provider Meredeth Ide       Initial Encounter Date:  Flowsheet Row Pulmonary Rehab from 11/18/2022 in [facility] Cardiac and Pulmonary Rehab  Date 11/18/22       Visit Diagnosis: Stage 3 severe COPD by GOLD classification  Patient's Home Medications on Admission:  Current Outpatient Medications:    albuterol (VENTOLIN HFA) 108 (90 Base) MCG/ACT inhaler, SMARTSIG:2 Inhalation Via Inhaler Every 6 Hours PRN, Disp: , Rfl:    amLODipine (NORVASC) 5 MG tablet, Take 5 mg by mouth daily., Disp: , Rfl:    aspirin EC 81 MG tablet, Take by mouth., Disp: , Rfl:    budesonide-formoterol (SYMBICORT) 160-4.5 MCG/ACT inhaler, Inhale 2 puffs into the lungs daily. , Disp: , Rfl:    carvedilol (COREG) 25 MG tablet, Take 25 mg by mouth 2 (two) times daily., Disp: , Rfl:    doxycycline (PERIOSTAT) 20 MG tablet, Take 1 tablet (20 mg total) by mouth 2 (two) times daily., Disp: 60 tablet, Rfl: 6   DULoxetine (CYMBALTA) 20 MG capsule, Take 2 capsules (40 mg total) by mouth 2 (two) times daily., Disp: 360 capsule, Rfl: 3   furosemide (LASIX) 40 MG tablet, Take 40 mg by mouth daily., Disp: , Rfl:    levothyroxine (SYNTHROID) 125 MCG tablet, Take 1 tablet (125 mcg total) by mouth daily before breakfast., Disp: 90 tablet, Rfl: 3   ondansetron (ZOFRAN ODT) 4 MG disintegrating tablet, Take 1 tablet (4 mg total) by mouth every 8 (eight) hours as needed for nausea or vomiting., Disp: 20 tablet, Rfl: 2   spironolactone (ALDACTONE) 25 MG tablet, Take 25 mg by mouth daily., Disp: , Rfl:    topiramate (TOPAMAX) 50 MG tablet, Take 50 mg by mouth 2 (two) times daily., Disp: , Rfl:    valACYclovir (VALTREX) 1000 MG tablet, , Disp: , Rfl:    warfarin (COUMADIN) 2.5 MG tablet, Take 5 mg by mouth daily., Disp: ,  Rfl:   Current Facility-Administered Medications:    ipratropium-albuterol (DUONEB) 0.5-2.5 (3) MG/3ML nebulizer solution 3 mL, 3 mL, Nebulization, Once, Pollak, Adriana M, PA-C  Past Medical History: Past Medical History:  Diagnosis Date   Aortic dissection (HCC) 2010   COPD (chronic obstructive pulmonary disease) (HCC)    Depression    Hypertension    Hypothyroidism    Pneumonia    PONV (postoperative nausea and vomiting)    Rosacea    Seasonal allergies    Sleep apnea    Does not use CPAP    Tobacco Use: Social History   Tobacco Use  Smoking Status Former   Packs/day: 0.50   Years: 30.00   Additional pack years: 0.00   Total pack years: 15.00   Types: Cigarettes   Quit date: 03/11/2016   Years since quitting: 6.9  Smokeless Tobacco Never  Tobacco Comments   patient states  that she is working at her own pace to quit smoking    Labs: Review Flowsheet       Latest Ref Rng & Units 07/02/2009 08/29/2015 01/05/2018 09/29/2020  Labs for ITP Cardiac and Pulmonary Rehab  Cholestrol 100 - 199 mg/dL - 409  811  914   LDL (calc) 0 - 99 mg/dL - 782  956  213   HDL-C >39 mg/dL - 53  50  45  Trlycerides 0 - 149 mg/dL - 161  096  045   TCO2 0 - 100 mmol/L 22  - - -     Pulmonary Assessment Scores:  Pulmonary Assessment Scores     Row Name 11/18/22 1739 02/13/23 1557       ADL UCSD   ADL Phase -- Exit    SOB Score total 36 33    Rest 0 0    Walk 4 2    Stairs 4 4    Bath 3 1    Dress 1 0    Shop 3 2      CAT Score   CAT Score 16 11      mMRC Score   mMRC Score 2.5 --             UCSD: Self-administered rating of dyspnea associated with activities of daily living (ADLs) 6-point scale (0 = "not at all" to 5 = "maximal or unable to do because of breathlessness")  Scoring Scores range from 0 to 120.  Minimally important difference is 5 units  CAT: CAT can identify the health impairment of COPD patients and is better correlated with disease progression.   CAT has a scoring range of zero to 40. The CAT score is classified into four groups of low (less than 10), medium (10 - 20), high (21-30) and very high (31-40) based on the impact level of disease on health status. A CAT score over 10 suggests significant symptoms.  A worsening CAT score could be explained by an exacerbation, poor medication adherence, poor inhaler technique, or progression of COPD or comorbid conditions.  CAT MCID is 2 points  mMRC: mMRC (Modified Medical Research Council) Dyspnea Scale is used to assess the degree of baseline functional disability in patients of respiratory disease due to dyspnea. No minimal important difference is established. A decrease in score of 1 point or greater is considered a positive change.   Pulmonary Function Assessment:  Pulmonary Function Assessment - 11/18/22 1734       Initial Spirometry Results   FVC% 72 %    FEV1% 34 %    FEV1/FVC Ratio 38      Post Bronchodilator Spirometry Results   FVC% 79 %    FEV1% 37 %    FEV1/FVC Ratio 38             Exercise Target Goals: Exercise Program Goal: Individual exercise prescription set using results from initial 6 min walk test and THRR while considering  patient's activity barriers and safety.   Exercise Prescription Goal: Initial exercise prescription builds to 30-45 minutes a day of aerobic activity, 2-3 days per week.  Home exercise guidelines will be given to patient during program as part of exercise prescription that the participant will acknowledge.  Education: Aerobic Exercise: - Group verbal and visual presentation on the components of exercise prescription. Introduces F.I.T.T principle from ACSM for exercise prescriptions.  Reviews F.I.T.T. principles of aerobic exercise including progression. Written material given at graduation. Flowsheet Row Pulmonary Rehab from 01/29/2023 in [facility] Cardiac and Pulmonary Rehab  Date 11/27/22  Educator KW  Instruction Review Code 1-  [employer] Understanding       Education: Resistance Exercise: - Group verbal and visual presentation on the components of exercise prescription. Introduces F.I.T.T principle from ACSM for exercise prescriptions  Reviews F.I.T.T. principles of resistance exercise including progression. Written material given at graduation. Flowsheet Row Pulmonary Rehab from 01/29/2023 in [facility] Cardiac and Pulmonary Rehab  Date 12/04/22  Educator KW  Instruction Review Code 1- [employer] Understanding        Education: Exercise & Equipment Safety: - Individual verbal instruction and demonstration of equipment use and safety with use of the equipment. Flowsheet Row Pulmonary Rehab from 01/29/2023 in [facility] Cardiac and Pulmonary Rehab  Date 11/18/22  Educator [facility]  Instruction Review Code 1- Verbalizes Understanding       Education: Exercise Physiology & General Exercise Guidelines: - Group verbal and written instruction with models to review the exercise physiology of the cardiovascular system and associated critical values. Provides general exercise guidelines with specific guidelines to those with heart or lung disease.    Education: Flexibility, Balance, Mind/Body Relaxation: - Group verbal and visual presentation with interactive activity on the components of exercise prescription. Introduces F.I.T.T principle from ACSM for exercise prescriptions. Reviews F.I.T.T. principles of flexibility and balance exercise training including progression. Also discusses the mind body connection.  Reviews various relaxation techniques to help reduce and manage stress (i.e. Deep breathing, progressive muscle relaxation, and visualization). Balance handout provided to take home. Written material given at graduation. Flowsheet Row Pulmonary Rehab from 01/29/2023 in [facility] Cardiac and Pulmonary Rehab  Date 12/11/22  Educator KW  Instruction Review Code 1- Verbalizes Understanding       Activity Barriers & Risk  Stratification:  Activity Barriers & Cardiac Risk Stratification - 11/18/22 1716       Activity Barriers & Cardiac Risk Stratification   Activity Barriers None             6 Minute Walk:  6 Minute Walk     Row Name 11/18/22 1708 02/12/23 1600       6 Minute Walk   Phase Initial Discharge    Distance 580 feet 985 feet    Distance % Change -- 69.8 %    Distance Feet Change -- 505 ft    Walk Time 4.5 minutes 6 minutes    # of Rest Breaks 4 0    MPH 1.46 1.87    METS 2.3 2.83    RPE 14 11    Perceived Dyspnea  3 2    VO2 Peak 8.04 9.88    Symptoms Yes (comment)  SOB Yes (comment)    Comments -- SOB    Resting HR 89 bpm 83 bpm    Resting BP 138/84 104/60    Resting Oxygen Saturation  93 % 98 %    Exercise Oxygen Saturation  during 6 min walk 94 % 96 %    Max Ex. HR 104 bpm 100 bpm    Max Ex. BP 146/72 122/64    2 Minute Post BP 122/60 110/60      Interval HR   1 Minute HR 99 88    2 Minute HR 104 96    3 Minute HR 95 97    4 Minute HR 95 97    5 Minute HR 95 100    6 Minute HR 98 98    2 Minute Post HR 98 93    Interval Heart Rate? Yes Yes      Interval Oxygen   Interval Oxygen? Yes Yes    Baseline Oxygen Saturation % 93 % 98 %    1 Minute Oxygen Saturation % 99 % 98 %    1 Minute Liters of Oxygen 2 L 2 L    2 Minute Oxygen Saturation % 104 % 98 %    2 Minute Liters of Oxygen 2 L 2 L  3 Minute Oxygen Saturation % 95 % 98 %    3 Minute Liters of Oxygen 2 L 2 L    4 Minute Oxygen Saturation % 95 % 96 %    4 Minute Liters of Oxygen 2 L 2 L    5 Minute Oxygen Saturation % 95 % 97 %    5 Minute Liters of Oxygen 2 L 2 L    6 Minute Oxygen Saturation % 98 % 96 %    6 Minute Liters of Oxygen 2 L 2 L    2 Minute Post Oxygen Saturation % 98 % 98 %    2 Minute Post Liters of Oxygen 2 L 2 L            Oxygen Initial Assessment:  Oxygen Initial Assessment - 11/18/22 1732       Home Oxygen   Home Oxygen Device Portable Concentrator    Sleep Oxygen  Prescription Continuous    Home Exercise Oxygen Prescription --    Liters per minute 2    Home Resting Oxygen Prescription --    Liters per minute 2    Compliance with Home Oxygen Use No      Initial 6 min Walk   Oxygen Used Continuous    Liters per minute 2      Program Oxygen Prescription   Program Oxygen Prescription Continuous    Liters per minute 2      Intervention   Short Term Goals To learn and understand importance of monitoring SPO2 with pulse oximeter and demonstrate accurate use of the pulse oximeter.;To learn and understand importance of maintaining oxygen saturations>88%;To learn and demonstrate proper pursed lip breathing techniques or other breathing techniques. ;To learn and demonstrate proper use of respiratory medications    Long  Term Goals Verbalizes importance of monitoring SPO2 with pulse oximeter and return demonstration;Maintenance of O2 saturations>88%;Exhibits proper breathing techniques, such as pursed lip breathing or other method taught during program session;Compliance with respiratory medication             Oxygen Re-Evaluation:  Oxygen Re-Evaluation     Row Name 11/21/22 1608 12/12/22 1208 01/20/23 1602 02/10/23 1547       Program Oxygen Prescription   Program Oxygen Prescription -- Continuous Continuous Continuous    Liters per minute -- 2 2 2       Home Oxygen   Home Oxygen Device -- Portable Concentrator Portable Concentrator Portable Concentrator;Home Concentrator    Sleep Oxygen Prescription -- Continuous Continuous Continuous    Liters per minute -- 2 2 2     Home Exercise Oxygen Prescription -- Continuous Continuous Continuous    Liters per minute -- 2 2 2     Home Resting Oxygen Prescription -- Continuous None None    Liters per minute -- 2 -- 0    Compliance with Home Oxygen Use -- No Yes Yes      Goals/Expected Outcomes   Short Term Goals To learn and understand importance of monitoring SPO2 with pulse oximeter and demonstrate  accurate use of the pulse oximeter.;To learn and understand importance of maintaining oxygen saturations>88%;To learn and demonstrate proper pursed lip breathing techniques or other breathing techniques. ;To learn and demonstrate proper use of respiratory medications To learn and demonstrate proper pursed lip breathing techniques or other breathing techniques.  To learn and demonstrate proper pursed lip breathing techniques or other breathing techniques. ;To learn and understand importance of monitoring SPO2 with pulse oximeter and demonstrate accurate use of the  pulse oximeter.;To learn and understand importance of maintaining oxygen saturations>88%;To learn and demonstrate proper use of respiratory medications;To learn and exhibit compliance with exercise, home and travel O2 prescription Other    Long  Term Goals Verbalizes importance of monitoring SPO2 with pulse oximeter and return demonstration;Maintenance of O2 saturations>88%;Exhibits proper breathing techniques, such as pursed lip breathing or other method taught during program session;Compliance with respiratory medication Exhibits proper breathing techniques, such as pursed lip breathing or other method taught during program session Exhibits proper breathing techniques, such as pursed lip breathing or other method taught during program session;Maintenance of O2 saturations>88%;Verbalizes importance of monitoring SPO2 with pulse oximeter and return demonstration;Exhibits compliance with exercise, home  and travel O2 prescription;Compliance with respiratory medication;Demonstrates proper use of MDI's Other    Comments Reviewed PLB technique with pt.  Talked about how it works and it's importance in maintaining their exercise saturations. Diaphragmatic and PLB breathing explained and performed with patient. Patient has a better understanding of how to do these exercises to help with breathing performance and relaxation. Patient performed breathing  techniques adequately and to practice further at home. Adelene is doing well with using her PLB. She is good about keeping an eye on her saturations.  She will use her oxygen every night like she is supposed to and during the day for activity.  She is good when she resting.  She feels that she is doing well with where she is with her breathing. Rada does not have any changes to her oxygen levels and no questions about her medications. She has been maintaining her oxygen saturations in the program and at home on two liters.    Goals/Expected Outcomes Short: Become more profiecient at using PLB.   Long: Become independent at using PLB. Short: practice PLB and diaphragmatic breathing at home. Long: Use PLB and diaphragmatic breathing independently post [employer]. Short: Continue to use her PLB to manage breathing Long: continued compliance Short: maintain PLB and oxygen post [employer]. Long: maintain PLB and oxygen saturation independently.             Oxygen Discharge (Final Oxygen Re-Evaluation):  Oxygen Re-Evaluation - 02/10/23 1547       Program Oxygen Prescription   Program Oxygen Prescription Continuous    Liters per minute 2      Home Oxygen   Home Oxygen Device Portable Concentrator;Home Concentrator    Sleep Oxygen Prescription Continuous    Liters per minute 2    Home Exercise Oxygen Prescription Continuous    Liters per minute 2    Home Resting Oxygen Prescription None    Liters per minute 0    Compliance with Home Oxygen Use Yes      Goals/Expected Outcomes   Short Term Goals Other    Long  Term Goals Other    Comments August does not have any changes to her oxygen levels and no questions about her medications. She has been maintaining her oxygen saturations in the program and at home on two liters.    Goals/Expected Outcomes Short: maintain PLB and oxygen post [employer]. Long: maintain PLB and oxygen saturation independently.             Initial Exercise Prescription:   Initial Exercise Prescription - 11/18/22 1700       Date of Initial Exercise RX and Referring Provider   Date 11/18/22    Referring Provider Meredeth Ide      Oxygen   Oxygen Continuous    Liters 2  Maintain Oxygen Saturation 88% or higher      Treadmill   MPH 0.8    Grade 0    Minutes 15    METs 1      Recumbant Bike   Level 1    RPM 50    Minutes 15    METs 1.46      NuStep   Level 1    SPM 80    Minutes 15    METs 1.46      REL-XR   Level 1    Speed 50    Minutes 15    METs 1.46      T5 Nustep   Level 1    SPM 80    Minutes 15    METs 2.3      Track   Laps 10    Minutes 15    METs 1.54      Prescription Details   Frequency (times per week) 3    Duration Progress to 30 minutes of continuous aerobic without signs/symptoms of physical distress      Intensity   THRR 40-80% of Max Heartrate 117-146    Perceived Dyspnea 0-4      Progression   Progression Continue to progress workloads to maintain intensity without signs/symptoms of physical distress.      Resistance Training   Training Prescription Yes    Weight 2    Reps 10-15             Perform Capillary Blood Glucose checks as needed.  Exercise Prescription Changes:   Exercise Prescription Changes     Row Name 11/18/22 1700 11/25/22 1100 12/10/22 1300 12/23/22 1200 01/06/23 1400     Response to Exercise   Blood Pressure (Admit) 138/84 116/58 118/60 118/60 104/60   Blood Pressure (Exercise) 146/72 128/64 144/80 -- --   Blood Pressure (Exit) 122/60 110/58 110/60 120/62 110/58   Heart Rate (Admit) 89 bpm 89 bpm 84 bpm 80 bpm 76 bpm   Heart Rate (Exercise) 104 bpm 98 bpm 96 bpm 100 bpm 100 bpm   Heart Rate (Exit) 98 bpm 85 bpm 89 bpm 93 bpm 94 bpm   Oxygen Saturation (Admit) 93 % 95 % 98 % 98 % 98 %   Oxygen Saturation (Exercise) 94 % 89 % 92 % 94 % 88 %   Oxygen Saturation (Exit) 98 % 94 % 96 % 98 % 97 %   Rating of Perceived Exertion (Exercise) Perceived Dyspnea  (Exercise) Symptoms SOB SOB SOB SOB SOB   Comments 6 MWT results 1st full day of exercise -- -- --   Duration -- Progress to 30 minutes of  aerobic without signs/symptoms of physical distress Progress to 30 minutes of  aerobic without signs/symptoms of physical distress Continue with 30 min of aerobic exercise without signs/symptoms of physical distress. Continue with 30 min of aerobic exercise without signs/symptoms of physical distress.   Intensity -- THRR unchanged THRR unchanged THRR unchanged THRR unchanged     Progression   Progression -- Continue to progress workloads to maintain intensity without signs/symptoms of physical distress. Continue to progress workloads to maintain intensity without signs/symptoms of physical distress. Continue to progress workloads to maintain intensity without signs/symptoms of physical distress. Continue to progress workloads to maintain intensity without signs/symptoms of physical distress.   Average METs -- 1.69 2.12 2.43 2.29     Resistance Training  Training Prescription -- Yes Yes Yes Yes   Weight -- 2 lb 2 lb 2 lb 2 lb   Reps -- 10-15 10-15 10-15 10-15     Interval Training   Interval Training -- No No No No     Oxygen   Oxygen -- Continuous Continuous Continuous Continuous   Liters -- 2 2 2 2      Treadmill   MPH -- 0.9 1.2 1.8 1.8   Grade -- 0 0 0.5 0.5   Minutes -- 15 15 15 15    METs -- 1.7 1.92 2.5 2.5     Recumbant Bike   Level -- -- 2 3 3    Watts -- -- -- 22 25   Minutes -- -- 15 15 15    METs -- -- 2.91 2.88 --     NuStep   Level -- 2 3 -- 3   Minutes -- 15 15 15 15    METs -- 1.7 2.4 2.3 2.7     REL-XR   Level -- -- -- 1 3   Minutes -- -- -- 15 15   METs -- -- -- 2.9 --     T5 Nustep   Level -- -- 2 2 2    Minutes -- -- 15 15 15    METs -- -- 1.8 1.8 1.8     Oxygen   Maintain Oxygen Saturation -- -- 88% or higher 88% or higher 88% or higher    Row Name 01/15/23 1500 01/20/23 1100 01/20/23 1200 02/04/23  1500 02/17/23 1400     Response to Exercise   Blood Pressure (Admit) -- 140/70 -- 118/66 110/62   Blood Pressure (Exercise) -- 136/72 -- -- --   Blood Pressure (Exit) -- 112/62 -- 96/60 102/60   Heart Rate (Admit) -- 80 bpm -- 98 bpm 83 bpm   Heart Rate (Exercise) -- 108 bpm -- 119 bpm 109 bpm   Heart Rate (Exit) -- 101 bpm -- 101 bpm 91 bpm   Oxygen Saturation (Admit) -- 96 % -- 98 % 99 %   Oxygen Saturation (Exercise) -- 94 % -- 91 % 95 %   Oxygen Saturation (Exit) -- 96 % -- 98 % 98 %   Rating of Perceived Exertion (Exercise) -- 13 -- 12 12   Perceived Dyspnea (Exercise) -- 3 -- 3 3   Symptoms -- SOB -- SOB SOB   Comments -- return from surgery -- -- --   Duration -- Continue with 30 min of aerobic exercise without signs/symptoms of physical distress. -- Continue with 30 min of aerobic exercise without signs/symptoms of physical distress. Continue with 30 min of aerobic exercise without signs/symptoms of physical distress.   Intensity -- THRR unchanged -- THRR unchanged THRR unchanged     Progression   Progression -- Continue to progress workloads to maintain intensity without signs/symptoms of physical distress. -- Continue to progress workloads to maintain intensity without signs/symptoms of physical distress. Continue to progress workloads to maintain intensity without signs/symptoms of physical distress.   Average METs -- 2.1 -- 2.39 2.49     Resistance Training   Training Prescription -- Yes -- Yes Yes   Weight -- 2 lb -- 2 lb 2 lb   Reps -- 10-15 -- 10-15 10-15     Interval Training   Interval Training -- No -- No No     Oxygen   Oxygen -- Continuous -- Continuous Continuous   Liters -- 2 -- 2 2     Treadmill  MPH -- -- 1.4 1.5 1.5   Grade -- -- 0.5 0 0   Minutes -- -- 15 15 15    METs -- -- 2.17 2.15 2.15     Recumbant Bike   Level -- -- 3 -- --   Watts -- -- 13 -- --   Minutes -- -- 15 -- --   METs -- -- 2.45 -- --     NuStep   Level -- -- -- 4 4   Minutes  -- -- -- 15 15   METs -- -- -- 2.9 3     Recumbant Elliptical   Level -- -- -- -- 1   Minutes -- -- -- -- 15   METs -- -- -- -- 1.8     REL-XR   Level -- -- -- 4 4   Minutes -- -- -- 15 15   METs -- -- -- 3.7 4.1     T5 Nustep   Level -- -- 2 1 2    Minutes -- -- 15 15 15    METs -- -- 1.8 1.8 2.1     Home Exercise Plan   Plans to continue exercise at Home (comment)  Walking at home, 2 lb hand weights -- Home (comment)  Walking at home, 2 lb hand weights Home (comment)  Walking at home, 2 lb hand weights Home (comment)  Walking at home, 2 lb hand weights   Frequency Add 2 additional days to program exercise sessions. -- Add 2 additional days to program exercise sessions. Add 2 additional days to program exercise sessions. Add 2 additional days to program exercise sessions.   Initial Home Exercises Provided 01/15/23 -- 01/15/23 01/15/23 01/15/23     Oxygen   Maintain Oxygen Saturation 88% or higher -- 88% or higher 88% or higher 88% or higher            Exercise Comments:   Exercise Comments     Row Name 11/21/22 1606           Exercise Comments First full day of exercise!  Patient was oriented to gym and equipment including functions, settings, policies, and procedures.  Patient's individual exercise prescription and treatment plan were reviewed.  All starting workloads were established based on the results of the 6 minute walk test done at initial orientation visit.  The plan for exercise progression was also introduced and progression will be customized based on patient's performance and goals.                Exercise Goals and Review:   Exercise Goals     Row Name 11/18/22 1722             Exercise Goals   Increase Physical Activity Yes       Intervention Provide advice, education, support and counseling about physical activity/exercise needs.;Develop an individualized exercise prescription for aerobic and resistive training based on initial evaluation  findings, risk stratification, comorbidities and participant's personal goals.       Expected Outcomes Short Term: Attend rehab on a regular basis to increase amount of physical activity.;Long Term: Add in home exercise to make exercise part of routine and to increase amount of physical activity.;Long Term: Exercising regularly at least 3-5 days a week.       Increase Strength and Stamina Yes       Intervention Provide advice, education, support and counseling about physical activity/exercise needs.;Develop an individualized exercise prescription for aerobic and resistive training based on initial evaluation findings,  risk stratification, comorbidities and participant's personal goals.       Expected Outcomes Short Term: Increase workloads from initial exercise prescription for resistance, speed, and METs.;Short Term: Perform resistance training exercises routinely during rehab and add in resistance training at home;Long Term: Improve cardiorespiratory fitness, muscular endurance and strength as measured by increased METs and functional capacity ( )       Able to understand and use rate of perceived exertion (RPE) scale Yes       Intervention Provide education and explanation on how to use RPE scale       Expected Outcomes Short Term: Able to use RPE daily in rehab to express subjective intensity level;Long Term:  Able to use RPE to guide intensity level when exercising independently       Able to understand and use Dyspnea scale Yes       Intervention Provide education and explanation on how to use Dyspnea scale       Expected Outcomes Short Term: Able to use Dyspnea scale daily in rehab to express subjective sense of shortness of breath during exertion;Long Term: Able to use Dyspnea scale to guide intensity level when exercising independently       Knowledge and understanding of Target Heart Rate Range (THRR) Yes       Intervention Provide education and explanation of THRR including how the numbers  were predicted and where they are located for reference       Expected Outcomes Short Term: Able to state/look up THRR;Long Term: Able to use THRR to govern intensity when exercising independently;Short Term: Able to use daily as guideline for intensity in rehab       Able to check pulse independently Yes       Intervention Provide education and demonstration on how to check pulse in carotid and radial arteries.;Review the importance of being able to check your own pulse for safety during independent exercise       Expected Outcomes Short Term: Able to explain why pulse checking is important during independent exercise       Understanding of Exercise Prescription Yes       Intervention Provide education, explanation, and written materials on patient's individual exercise prescription       Expected Outcomes Short Term: Able to explain program exercise prescription;Long Term: Able to explain home exercise prescription to exercise independently                Exercise Goals Re-Evaluation :  Exercise Goals Re-Evaluation     Row Name 11/21/22 1607 11/25/22 1123 12/10/22 1400 12/23/22 1211 01/06/23 1419     Exercise Goal Re-Evaluation   Exercise Goals Review Increase Physical Activity;Able to understand and use rate of perceived exertion (RPE) scale;Knowledge and understanding of Target Heart Rate Range (THRR);Understanding of Exercise Prescription;Increase Strength and Stamina;Able to check pulse independently;Able to understand and use Dyspnea scale Increase Physical Activity;Increase Strength and Stamina;Understanding of Exercise Prescription Increase Physical Activity;Increase Strength and Stamina;Understanding of Exercise Prescription Increase Physical Activity;Increase Strength and Stamina;Understanding of Exercise Prescription Increase Physical Activity;Increase Strength and Stamina;Understanding of Exercise Prescription   Comments Reviewed RPE scale, THR and program prescription with pt  today.  Pt voiced understanding and was given a copy of goals to take home. Tache is off to a good start with rehab for her first session. She was able to work at her initial exercise prescription and even went up to level 2 on the T4 Nustep.  RPEs were appropriate. We will continue to  monitor as she progresses in the program. Myeasha is doing well in the program. She increased her overall average MET level to 2.12 METs. She also improved to level 2 on the T5 Nustep and level 3 on the T4 Nustep. She also increased her walking speed on the treadmill to 1.2 mph. We will continue to monitor her progress in the program. Sanita continues to do well in rehab. She increased her treadmill speed to 1.8 mph and added a 0.5% incline. She also increased to level 3 on the recumbent bike and worked up to 22 watts! Her oxygen saturation is staying above 88%. We will continue to monitor her progression. Deannah is doing well in rehab. She has continued to walk on the treadmill at a speed of 1.8 mph and an incline of 0.5%. However, she has had to pause while walking due to SOB. She also improved to level 3 on the XR. She has continued to work at a MET level above 2 METs as well. We will continue to monitor her progress in the program.   Expected Outcomes Short: Use RPE daily to regulate intensity.  Long: Follow program prescription in THR. Short: Continue to follow exercise prescription Long: Progressively increase overall MET level and stamina Short: Continue to increase treadmill workload. Long: Continue to improve strength and stamina. Short: Continue to increase treadmill speed Long: Continue to increase overall MET level and stamina Short: Continue to progressively increase treadmill workload. Long: Continue to improve strength and stamina.    Row Name 01/15/23 1550 01/20/23 1202 01/20/23 1552 02/04/23 1525 02/10/23 1544     Exercise Goal Re-Evaluation   Exercise Goals Review Increase Physical Activity;Able to understand and use  Dyspnea scale;Understanding of Exercise Prescription;Increase Strength and Stamina;Knowledge and understanding of Target Heart Rate Range (THRR);Able to check pulse independently;Able to understand and use rate of perceived exertion (RPE) scale Increase Physical Activity;Increase Strength and Stamina;Understanding of Exercise Prescription Increase Physical Activity;Increase Strength and Stamina;Understanding of Exercise Prescription Increase Physical Activity;Increase Strength and Stamina;Understanding of Exercise Prescription Increase Physical Activity;Increase Strength and Stamina   Comments Reviewed home exercise with pt today.  Pt plans to walk at home for exercise and use 2 lb hand weights for resistance training.  Reviewed THR, pulse, RPE, sign and symptoms, pulse oximetery and when to call 911 or MD.  Also discussed weather considerations and indoor options.  Pt voiced understanding. Amarah was out for surgery but returned back for a couple sessions. She is easing back into exercise and will encourage her to increase her workloads once she is back into rhythm. Her oxygen saturations are staying above 88% and we hope to see her watts on the recumbent bike increase. Will continue to monitor. Nalaysia is doing well.  She is feel better and recovering from surgery.  She is staying active again.  She is using hand weights and walking at home on her off days.  She was feelng better with her stamina prior to surgery but now feels like she is restarting again. Sharry is doing well in rehab since returning back to the program after having surgery. She was able to increase her walking speed on the treadmill back up to 1.5 mph with no incline. She also was able to improve to level 4 on both the T4 nustep and the XR. She has continued to use 2 lb hand weights for resistance training as well. We will continue to monitor her progress in the program. Rhyli states that she does not know where  she wants to exercise after [facility].  She has been doing well in the program and trying to push herself. She may walk at the mall or at home.   Expected Outcomes Short: Continue to walk at home on days away from rehab. Long: Continue to work towards independence with exercise. Short: Increase watts on recumbant bike LonG: Continue to increase overall MET level and stamina Short; Get back to routine exercise again Long: Conitnue to rebuild stamina Short: Continue to increase workloads back up to levels prior to surgery. Long: Continue to improve strength and stamina. Short: exerciser post [facility]. Long: maintain an exercise routine independently.    Row Name 02/17/23 1441             Exercise Goal Re-Evaluation   Exercise Goals Review Increase Physical Activity;Increase Strength and Stamina;Understanding of Exercise Prescription       Comments Kylei continues to do well in rehab. she completed her post and improved by almost 70%!! Her oxygen stays well above 88%. She has stayed pretty consistent at level 4 on the XR and T4Nustep as well as maintaining a speed of 1.5 mph on the treadmill. She could benefit from increasing to 3 lb for handweights. We will continue to monitor until she graduates.       Expected Outcomes Short: Graduate Long: Continue to increase overall MET level and stamina                Discharge Exercise Prescription (Final Exercise Prescription Changes):  Exercise Prescription Changes - 02/17/23 1400       Response to Exercise   Blood Pressure (Admit) 110/62    Blood Pressure (Exit) 102/60    Heart Rate (Admit) 83 bpm    Heart Rate (Exercise) 109 bpm    Heart Rate (Exit) 91 bpm    Oxygen Saturation (Admit) 99 %    Oxygen Saturation (Exercise) 95 %    Oxygen Saturation (Exit) 98 %    Rating of Perceived Exertion (Exercise) 12    Perceived Dyspnea (Exercise) 3    Symptoms SOB    Duration Continue with 30 min of aerobic exercise without signs/symptoms of physical distress.    Intensity THRR  unchanged      Progression   Progression Continue to progress workloads to maintain intensity without signs/symptoms of physical distress.    Average METs 2.49      Resistance Training   Training Prescription Yes    Weight 2 lb    Reps 10-15      Interval Training   Interval Training No      Oxygen   Oxygen Continuous    Liters 2      Treadmill   MPH 1.5    Grade 0    Minutes 15    METs 2.15      NuStep   Level 4    Minutes 15    METs 3      Recumbant Elliptical   Level 1    Minutes 15    METs 1.8      REL-XR   Level 4    Minutes 15    METs 4.1      T5 Nustep   Level 2    Minutes 15    METs 2.1      Home Exercise Plan   Plans to continue exercise at Home (comment)   Walking at home, 2 lb hand weights   Frequency Add 2 additional days to program exercise sessions.  Initial Home Exercises Provided 01/15/23      Oxygen   Maintain Oxygen Saturation 88% or higher             Nutrition:  Target Goals: Understanding of nutrition guidelines, daily intake of sodium 1500mg , cholesterol 200mg , calories 30% from fat and 7% or less from saturated fats, daily to have 5 or more servings of fruits and vegetables.  Education: All About Nutrition: -Group instruction provided by verbal, written material, interactive activities, discussions, models, and posters to present general guidelines for heart healthy nutrition including fat, fiber, MyPlate, the role of sodium in heart healthy nutrition, utilization of the nutrition label, and utilization of this knowledge for meal planning. Follow up email sent as well. Written material given at graduation. Flowsheet Row Pulmonary Rehab from 01/29/2023 in [facility]  Education need identified 11/18/22       Biometrics:  Pre Biometrics - 11/18/22 1722       Pre Biometrics   Height 5' 7.5" (1.715 m)    Weight 190 lb 4.8 oz (86.3 kg)    Waist Circumference 40 inches    Hip Circumference 44 inches     Waist to Hip Ratio 0.91 %    BMI (Calculated) 29.35    Single Leg Stand 2.66 seconds             Post Biometrics - 02/12/23 1604        Post  Biometrics   Height 5' 7.5" (1.715 m)    Weight 185 lb 6.4 oz (84.1 kg)    Waist Circumference 41 inches    Hip Circumference 42 inches    Waist to Hip Ratio 0.98 %    BMI (Calculated) 28.59    Single Leg Stand 3.06 seconds             Nutrition Therapy Plan and Nutrition Goals:  Nutrition Therapy & Goals - 02/24/23 1532       Nutrition Therapy   Diet Heart healthy, low Na, pulmonary MNT    Drug/Food Interactions Coumadin/Vit K    Protein (specify units) 100g    Fiber 25 grams    Whole Grain Foods 3 servings    Saturated Fats 16 max. grams    Fruits and Vegetables 8 servings/day    Sodium 1.5 grams      Personal Nutrition Goals   Nutrition Goal ST: practice MyPlate guidelines, mix pasta with non-starchy vegetables and lean protein, swap to whole grain bread LT: include 20-25g of fiber/day, limit Na <1.5g/day, include at least 5 fruit/vegetable servings per day    Comments 62 y.o. F admitted to pulmonary rehab for stg 3 COPD. PMHx includes OSA, hypothyroidism, anxiety/depression, HLD. Medications reviewed duoneb, trelegy, cymbalta, furosemide, zofran, warfarin. PYP 41. B: boiled eggs L: sandwich (white bread - [place] and mayo) and some chips or leftovers D: variety (pre-prepared or take out/restaurants) - examples include salad, pasta, bowl of soup from [employer], frozen vegetables. S: does not usually snack Drinks: diet pepsi. Daysia reports that her weight has been stable- surgery this past November when her appetite has been decreased previously and this has made it worse; she went from 216 to180 lbs  (she reports 16 lbs in 4 weeks and it slowed down after that). Discussed general healthy eating incluing types of fat, fiber, and healthy eating patterns including MyPlate. Discussed pulmonary MNT and vitamin K/warfarin MNT. Enedina would  like to limit her pasta intake.      Intervention Plan  Intervention Prescribe, educate and counsel regarding individualized specific dietary modifications aiming towards targeted core components such as weight, hypertension, lipid management, diabetes, heart failure and other comorbidities.    Expected Outcomes Short Term Goal: Understand basic principles of dietary content, such as calories, fat, sodium, cholesterol and nutrients.;Short Term Goal: A plan has been developed with personal nutrition goals set during dietitian appointment.;Long Term Goal: Adherence to prescribed nutrition plan.             Nutrition Assessments:  MEDIFICTS Score Key: ?70 Need to make dietary changes  40-70 Heart Healthy Diet ? 40 Therapeutic Level Cholesterol Diet  Flowsheet Row Pulmonary Rehab from 02/13/2023 in [facility]monary Rehab  Picture Your Plate Total Score on Discharge 56      Picture Your Plate Scores: <60 Unhealthy dietary pattern with much room for improvement. 41-50 Dietary pattern unlikely to meet recommendations for good health and room for improvement. 51-60 More healthful dietary pattern, with some room for improvement.  >60 Healthy dietary pattern, although there may be some specific behaviors that could be improved.   Nutrition Goals Re-Evaluation:  Nutrition Goals Re-Evaluation     Row Name 11/28/22 1604 12/12/22 1556 01/20/23 1558 02/10/23 1553       Goals   Current Weight 189 lb (85.7 kg) -- -- 185 lb (83.9 kg)    Nutrition Goal Manage Portion sizes Reduce diet soda intake Short: reduce soda intake. Long: adhere to a diet that pertains to her needs. Eat less pasta.    Comment Patient was informed on why it is important to maintain a balanced diet when dealing with Respiratory issues. Explained that it takes a lot of energy to breath and when they are short of breath often they will need to have a good diet to help keep up with the calories they are expending for  breathing. Patient states she is drinking more soda that she wants to. She also drinks Mio mixed with water. She wants to try to lose more weight. She does not over eat. Shamecca is doing well with her diet.  She is not gaining weight, so she feels she is doing okay. She is doing okay with her soda intake.  Some days she is able to go without, other days she has two or more.  She is still trying to add in more protein to her diet.  She has a protein shake each day. Karlea wants to lose more weight but she cant seem to lose any. She states she eats alot of pasta and that could be the cause.    Expected Outcome Short: Choose and plan snacks accordingly to patients caloric intake to improve breathing. Long: Maintain a diet independently that meets their caloric intake to aid in daily shortness of breath. Short: reduce soda intake. Long: adhere to a diet that pertains to her needs. Short: Continue to work on soda Long: Conitnue to add in more protein Short: lose a few pounds in the next few weeks. Long: maintain losing weight after [facility].             Nutrition Goals Discharge (Final Nutrition Goals Re-Evaluation):  Nutrition Goals Re-Evaluation - 02/10/23 1553       Goals   Current Weight 185 lb (83.9 kg)    Nutrition Goal Eat less pasta.    Comment Marbeth wants to lose more weight but she cant seem to lose any. She states she eats alot of pasta and that could be the cause.  Expected Outcome Short: lose a few pounds in the next few weeks. Long: maintain losing weight after [facility].             Psychosocial: Target Goals: Acknowledge presence or absence of significant depression and/or stress, maximize coping skills, provide positive support system. Participant is able to verbalize types and ability to use techniques and skills needed for reducing stress and depression.   Education: Stress, Anxiety, and Depression - Group verbal and visual presentation to define topics covered.  Reviews how  body is impacted by stress, anxiety, and depression.  Also discusses healthy ways to reduce stress and to treat/manage anxiety and depression.  Written material given at graduation. Flowsheet Row Pulmonary Rehab from 01/29/2023 in [facility] Card[facility] Pulmonary Rehab  Date 01/22/23  Educator KW  Instruction Review Code 1- [employer] Understanding       Education: Sleep Hygiene -Provides group verbal and written instruction about how sleep can affect your health.  Define sleep hygiene, discuss sleep cycles and impact of sleep habits. Review good sleep hygiene tips.    Initial Review & Psychosocial Screening:  Initial Psych Review & Screening - 11/07/22 1437       Initial Review   Current issues with Current Depression      Family Dynamics   Good Support System? Yes   sister, family     Screening Interventions   Interventions Encouraged to exercise;Provide feedback about the scores to participant    Expected Outcomes Short Term goal: Utilizing psychosocial counselor, staff and physician to assist with identification of specific Stressors or current issues interfering with healing process. Setting desired goal for each stressor or current issue identified.;Long Term Goal: Stressors or current issues are controlled or eliminated.;Short Term goal: Identification and review with participant of any Quality of Life or Depression concerns found by scoring the questionnaire.;Long Term goal: The participant improves quality of Life and PHQ9 Scores as seen by post scores and/or verbalization of changes             Quality of Life Scores:  Scores of 19 and below usually indicate a poorer quality of life in these areas.  A difference of  2-3 points is a clinically meaningful difference.  A difference of 2-3 points in the total score of the Quality of Life Index has been associated with significant improvement in overall quality of life, self-image, physical symptoms, and general health in studies  assessing change in quality of life.  PHQ-9: Review Flowsheet  More data exists      02/13/2023 01/28/2023 11/18/2022 09/21/2021 09/18/2020  Depression screen PHQ 2/9  Decreased Interest 1 0 1 0 0  Down, Depressed, Hopeless 0 0 0 0 0  PHQ - 2 Score 1 0 1 0 0  Altered sleeping 0 0 0 2 -  Tired, decreased energy -  Change in appetite 0 0 0 0 -  Feeling bad or failure about yourself  0 0 0 0 -  Trouble concentrating 0 0 0 0 -  Moving slowly or fidgety/restless 0 0 0 0 -  Suicidal thoughts 0 0 0 0 -  PHQ-9 Score -  Difficult doing work/chores Somewhat difficult Not difficult at all - Not difficult at all -   Interpretation of Total Score  Total Score Depression Severity:  1-4 = Minimal depression, 5-9 = Mild depression, 10-14 = Moderate depression, 15-19 = Moderately severe depression, 20-27 = Severe depression   Psychosocial Evaluation and Intervention:  Psychosocial Evaluation - 11/07/22 1442       Psychosocial Evaluation & Interventions   Interventions Encouraged to exercise with the program and follow exercise prescription    Comments Sidra is coming to pulmonary rehab for COPD. She is on cymbalta and reports it is working well. 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 She recently got a concentrator but it was unclear over the phone as to her oxygen use. She reports feeling like she is managing her health well independenlty and also has a great support system of family close by.    Expected Outcomes Short: attend pulmonary rehab for education and exercise. Long: develop and maintain positive self care habits.    Continue Psychosocial Services  Follow up required by staff             Psychosocial Re-Evaluation:  Psychosocial Re-Evaluation     Row Name 11/28/22 1605 12/12/22 1601 01/20/23 1555 02/10/23 1555       Psychosocial Re-Evaluation   Current issues with Current Psychotropic Meds;Current Depression;Current Anxiety/Panic  Current Psychotropic Meds;Current Depression;Current Anxiety/Panic Current Psychotropic Meds;Current Depression;Current Anxiety/Panic Current Psychotropic Meds;Current Depression;Current Anxiety/Panic    Comments Her depression stems from her health. Her balance was off and when she was sitting she would feel like the room was spinning. She would feel anxious last January when all of this started. She has been okay in the last couple of weeks and doing well. She has more procedures coming up and is going to stay on Cymbalta to help her mood. 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 Patient reports no issues with their current mental states, sleep, stress, depression or anxiety. Will follow up with patient in a few weeks for any changes. She says her mood is about the same as it was and takes her Celabrex as prescribed. Chrisandra is doing well in rehab.  She is feeling pretty good mentally.  She ran out of celebrex for a couple weeks and has just restarted it.  She is feeling okay with it now.  She has not had any depressions symptoms recently.  She is sleeping pretty well.  She is frustrated that surgery set her back wit her recovery, but she is fighting each day to go forward. Shantasia states that she has no changes with her mental sate since the last review.    Expected Outcomes Short: continue medication for mood. Long: maintain medciation to keep mood stable. Short: Continue to exercise regularly to support mental health and notify staff of any changes. Long: maintain mental health and well being through teaching of rehab or prescribed medications independently. SHort: Continue to build stamina again to do more Long: Conitnue to stay positive Short: Continue to exercise regularly to support mental health and notify staff of any changes. Long: maintain mental health and well being through teaching of rehab or prescribed medications independently.    Interventions Encouraged to attend Pulmonary  Rehabilitation for the exercise Encouraged to attend Pulmonary Rehabilitation for the exercise Encouraged to attend Pulmonary Rehabilitation for the exercise Encouraged to attend Pulmonary Rehabilitation for the exercise    Continue Psychosocial Services  Follow up required by staff Follow up required by staff Follow up required by staff No Follow up required             Psychosocial Discharge (Final Psychosocial Re-Evaluation):  Psychosocial Re-Evaluation - 02/10/23 1555       Psychosocial Re-Evaluation   Current issues with  Current Psychotropic Meds;Current Depression;Current Anxiety/Panic    Comments Mykaila states that she has no changes with her mental sate since the last review.    Expected Outcomes Short: Continue to exercise regularly to support mental health and notify staff of any changes. Long: maintain mental health and well being through teaching of rehab or prescribed medications independently.    Interventions Encouraged to attend Pulmonary Rehabilitation for the exercise    Continue Psychosocial Services  No Follow up required             Education: Education Goals: Education classes will be provided on a weekly basis, covering required topics. Participant will state understanding/return demonstration of topics presented.  Learning Barriers/Preferences:  Learning Barriers/Preferences - 11/07/22 1453       Learning Barriers/Preferences   Learning Barriers None    Learning Preferences Individual Instruction             General Pulmonary Education Topics:  Infection Prevention: - Provides verbal and written material to individual with discussion of infection control including proper hand washing and proper equipment cleaning during exercise session. Flowsheet Row Pulmonary Rehab from 01/29/2023 in [facility] Cardiac and Pulmonary Rehab  Date 11/18/22  Educator [facility]  Instruction Review Code 1- Verbalizes Understanding       Falls Prevention: - Provides verbal  and written material to individual with discussion of falls prevention and safety. Flowsheet Row Pulmonary Rehab from 01/29/2023 in [facility] Cardiac and Pulmonary Rehab  Date 11/18/22  Educator [facility]  Instruction Review Code 1- Verbalizes Understanding       Chronic Lung Disease Review: - Group verbal instruction with posters, models, PowerPoint presentations and videos,  to review new updates, new respiratory medications, new advancements in procedures and treatments. Providing information on websites and "800" numbers for continued self-education. Includes information about supplement oxygen, available portable oxygen systems, continuous and intermittent flow rates, oxygen safety, concentrators, and Medicare reimbursement for oxygen. Explanation of Pulmonary Drugs, including class, frequency, complications, importance of spacers, rinsing mouth after steroid MDI's, and proper cleaning methods for nebulizers. Review of basic lung anatomy and physiology related to function, structure, and complications of lung disease. Review of risk factors. Discussion about methods for diagnosing sleep apnea and types of masks and machines for OSA. Includes a review of the use of types of environmental controls: home humidity, furnaces, filters, dust mite/pet prevention, HEPA vacuums. Discussion about weather changes, air quality and the benefits of nasal washing. Instruction on Warning signs, infection symptoms, calling MD promptly, preventive modes, and value of vaccinations. Review of effective airway clearance, coughing and/or vibration techniques. Emphasizing that all should Create an Action Plan. Written material given at graduation. Flowsheet Row Pulmonary Rehab from 01/29/2023 in [facility] Cardiac and Pulmonary Rehab  Education need identified 11/18/22  Date 01/15/23  Educator [facility]  Instruction Review Code 1- Verbalizes Understanding       AED/CPR: - Group verbal and written instruction with the use of models to  demonstrate the basic use of the AED with the basic ABC's of resuscitation.    Anatomy and Cardiac Procedures: - Group verbal and visual presentation and models provide information about basic cardiac anatomy and function. Reviews the testing methods done to diagnose heart disease and the outcomes of the test results. Describes the treatment choices: Medical Management, Angioplasty, or Coronary Bypass Surgery for treating various heart conditions including Myocardial Infarction, Angina, Valve Disease, and Cardiac Arrhythmias.  Written material given at graduation. Flowsheet Row Pulmonary Rehab from 01/29/2023 in [facility] Cardiac and Pulmonary  Rehab  Date 12/18/22  Educator SB  Instruction Review Code 1- Verbalizes Understanding       Medication Safety: - Group verbal and visual instruction to review commonly prescribed medications for heart and lung disease. Reviews the medication, class of the drug, and side effects. Includes the steps to properly store meds and maintain the prescription regimen.  Written material given at graduation.   Other: -Provides group and verbal instruction on various topics (see comments) Flowsheet Row Pulmonary Rehab from 01/29/2023 in [facility] Cardiac and Pulmonary Rehab  Date 12/25/22  Educator SB  Instruction Review Code 1- Verbalizes Understanding  [Jeopardy]       Knowledge Questionnaire Score:  Knowledge Questionnaire Score - 02/13/23 1600       Knowledge Questionnaire Score   Pre Score 16/18    Post Score 17/18              Core Components/Risk Factors/Patient Goals at Admission:  Personal Goals and Risk Factors at Admission - 11/18/22 1723       Core Components/Risk Factors/Patient Goals on Admission    Weight Management Yes    Intervention Weight Management: Develop a combined nutrition and exercise program designed to reach desired caloric intake, while maintaining appropriate intake of nutrient and fiber, sodium and fats, and appropriate  energy expenditure required for the weight goal.;Weight Management: Provide education and appropriate resources to help participant work on and attain dietary goals.;Weight Management/Obesity: Establish reasonable short term and long term weight goals.    Admit Weight 190 lb 4.8 oz (86.3 kg)    Goal Weight: Short Term 185 lb (83.9 kg)    Goal Weight: Long Term 180 lb (81.6 kg)    Expected Outcomes Short Term: Continue to assess and modify interventions until short term weight is achieved;Long Term: Adherence to nutrition and physical activity/exercise program aimed toward attainment of established weight goal;Weight Loss: Understanding of general recommendations for a balanced deficit meal plan, which promotes 1-2 lb weight loss per week and includes a negative energy balance of [phone] kcal/d;Understanding recommendations for meals to include 15-35% energy as protein, 25-35% energy from fat, 35-60% energy from carbohydrates, less than 200mg  of dietary cholesterol, 20-35 gm of total fiber daily;Understanding of distribution of calorie intake throughout the day with the consumption of 4-5 meals/snacks    Hypertension Yes    Intervention Provide education on lifestyle modifcations including regular physical activity/exercise, weight management, moderate sodium restriction and increased consumption of fresh fruit, vegetables, and low fat dairy, alcohol moderation, and smoking cessation.;Monitor prescription use compliance.    Expected Outcomes Short Term: Continued assessment and intervention until BP is < 140/13mm HG in hypertensive participants. < 130/47mm HG in hypertensive participants with diabetes, heart failure or chronic kidney disease.;Long Term: Maintenance of blood pressure at goal levels.             Education:Diabetes - Individual verbal and written instruction to review signs/symptoms of diabetes, desired ranges of glucose level fasting, after meals and with exercise. Acknowledge that pre  and post exercise glucose checks will be done for 3 sessions at entry of program.   Know Your Numbers and Heart Failure: - Group verbal and visual instruction to discuss disease risk factors for cardiac and pulmonary disease and treatment options.  Reviews associated critical values for Overweight/Obesity, Hypertension, Cholesterol, and Diabetes.  Discusses basics of heart failure: signs/symptoms and treatments.  Introduces Heart Failure Zone chart for action plan for heart failure.  Written material given at graduation.   Core Components/Risk  Factors/Patient Goals Review:   Goals and Risk Factor Review     Row Name 11/28/22 1603 12/12/22 1559 01/20/23 1600 02/10/23 1550       Core Components/Risk Factors/Patient Goals Review   Personal Goals Review Improve shortness of breath with ADL's Weight Management/Obesity Weight Management/Obesity;Improve shortness of breath with ADL's;Increase knowledge of respiratory medications and ability to use respiratory devices properly.;Hypertension Weight Management/Obesity    Review Spoke to patient about their shortness of breath and what they can do to improve. Patient has been informed of breathing techniques when starting the program. Patient is informed to tell staff if they have had any med changes and that certain meds they are taking or not taking can be causing shortness of breath. Harvey wants to lose around 25 pounds to help with her breathing. She does not think she can do it but assured her that if her diet is right and she keeps exercising she can do it. Davan is back in rehab.  Her weight has been holding steady and she feels pretty good with it.  Her breathing has not gotten much better and she is using her inhalers and nebulizers at home.  Her blood pressures are doing well for most part but on occassion will run high. Deandre would like to lose more weight. She states she is stuck at 185 pounds. She states her shortness of breath has not got any better.     Expected Outcomes Short: Attend [facility] regularly to improve shortness of breath with ADL's. Long: maintain independence with ADL's Short: lose a few pounds in the next few weeks. Long: Reach 25 pounds weight loss. Short: Continue  to work on breathing LonG: continue to work on weight loss. Short: work on [employer] a few pounds. Long: continue working on weight loss independently.             Core Components/Risk Factors/Patient Goals at Discharge (Final Review):   Goals and Risk Factor Review - 02/10/23 1550       Core Components/Risk Factors/Patient Goals Review   Personal Goals Review Weight Management/Obesity    Review Callee would like to lose more weight. She states she is stuck at 185 pounds. She states her shortness of breath has not got any better.    Expected Outcomes Short: work on loseing a few pounds. Long: continue working on weight loss independently.             ITP Comments:  ITP Comments     Row Name 11/07/22 1442 11/18/22 1708 11/21/22 1606 12/04/22 1052 01/01/23 1507   ITP Comments Initial phone call completed. Diagnosis can be found in [facility] 11/28. EP Orientation scheduled for Monday 1/8 at 3pm. Completed and gym orientation. Initial ITP created and sent for review to Dr. Jinny Sanders, Medical Director. First full day of exercise!  Patient was oriented to gym and equipment including functions, settings, policies, and procedures.  Patient's individual exercise prescription and treatment plan were reviewed.  All starting workloads were established based on the results of the 6 minute walk test done at initial orientation visit.  The plan for exercise progression was also introduced and progression will be customized based on patient's performance and goals. 30 Day review completed. Medical Director ITP review done, changes made as directed, and signed approval by Medical Director.   new to propgram 30 day review completed. ITP sent to Dr. Jinny Sanders, Medical  Director of  Pulmonary Rehab. Continue with ITP unless changes are made by physician.  Row Name 01/13/23 1058 01/29/23 0937 02/26/23 1429 02/26/23 1553     ITP Comments Charday has been out of rehab due to surgery for descending thoracic aortic aneurysm at [facility] on 01/09/23. 30 Day review completed. Medical Director ITP review done, changes made as directed, and signed approval by Medical Director. 30 day review completed. ITP sent to Dr. Jinny Sanders, Medical Director of  Pulmonary Rehab. Continue with ITP unless changes are made by physician. Loveah graduated today from  rehab with 36 sessions completed.  Details of the patient's exercise prescription and what She needs to do in order to continue the prescription and progress were discussed with patient.  Patient was given a copy of prescription and goals.  Patient verbalized understanding.  Taylore plans to continue to exercise by walking             Comments: Discharge ITP

## 2023-02-27 ENCOUNTER — Encounter: Payer: 59 | Admitting: *Deleted

## 2023-03-25 ENCOUNTER — Other Ambulatory Visit: Payer: Self-pay | Admitting: Dermatology

## 2023-03-25 DIAGNOSIS — L719 Rosacea, unspecified: Secondary | ICD-10-CM

## 2023-04-02 ENCOUNTER — Ambulatory Visit (INDEPENDENT_AMBULATORY_CARE_PROVIDER_SITE_OTHER): Payer: Self-pay | Admitting: Dermatology

## 2023-04-02 VITALS — BP 120/72

## 2023-04-02 DIAGNOSIS — L988 Other specified disorders of the skin and subcutaneous tissue: Secondary | ICD-10-CM

## 2023-04-02 NOTE — Progress Notes (Signed)
   Follow-Up Visit   Subjective  Nancy [name] is a 62 y.o. female who presents for the following: filler for facial elastosis  The following portions of the chart were reviewed this encounter and updated as appropriate: medications, allergies, medical history  Review of Systems:  No other skin or systemic complaints except as noted in HPI or Assessment and Plan.  Objective  Well appearing patient in no apparent distress; mood and affect are within normal limits.  A focused examination was performed of the face. Relevant physical exam findings are noted in the Assessment and Plan or shown in photos.  Before photos               After Photos               Injection map photo    Assessment & Plan    Facial Elastosis  Prior to the procedure, the patient's past medical history, allergies and the rare but potential risks and complications were reviewed with the patient and a signed consent was obtained. Pre and post-treatment care was discussed and instructions provided.   Location: corners of mouth  Filler Type: Restylane defyne x 1cc, [id number] exp 02/09/2024  Procedure: The area was prepped thoroughly with Puracyn. After introducing the needle into the desired treatment area, the syringe plunger was drawn back to ensure there was no flash of blood prior to injecting the filler in order to minimize risk of intravascular injection and vascular occlusion. After injection of the filler, the treated areas were cleansed and iced to reduce swelling. Post-treatment instructions were reviewed with the patient.       Patient tolerated the procedure well. The patient will call with any problems, questions or concerns prior to their next appointment.  Recommend Alastin Neck cream   CREPINESS of THE ARMS Exam: crepiness of the arms  Treatment Plan: Recommend Alastin Transform body treatment followed by cerave cream for the arms,   Return for 1-62m fillers.  I,  Ardis Rowan, RMA, am acting as scribe for Armida Sans, MD .   Documentation: I have reviewed the above documentation for accuracy and completeness, and I agree with the above.  Armida Sans, MD

## 2023-04-02 NOTE — Patient Instructions (Signed)
Due to recent changes in healthcare laws, you may see results of your pathology and/or laboratory studies on MyChart before the doctors have had a chance to review them. We understand that in some cases there may be results that are confusing or concerning to you. Please understand that not all results are received at the same time and often the doctors may need to interpret multiple results in order to provide you with the best plan of care or course of treatment. Therefore, we ask that you please give us 2 business days to thoroughly review all your results before contacting the office for clarification. Should we see a critical lab result, you will be contacted sooner.   If You Need Anything After Your Visit  If you have any questions or concerns for your doctor, please call our main line at [phone] and press option 4 to reach your doctor's medical assistant. If no one answers, please leave a voicemail as directed and we will return your call as soon as possible. Messages left after 4 pm will be answered the following business day.   You may also send us a message via MyChart. We typically respond to MyChart messages within 1-2 business days.  For prescription refills, please ask your pharmacy to contact our office. Our fax number is [phone].  If you have an urgent issue when the clinic is closed that cannot wait until the next business day, you can page your doctor at the number below.    Please note that while we do our best to be available for urgent issues outside of office hours, we are not available 24/7.   If you have an urgent issue and are unable to reach us, you may choose to seek medical care at your doctor's office, retail clinic, urgent care center, or emergency room.  If you have a medical emergency, please immediately call 911 or go to the emergency department.  Pager Numbers  - Dr. Kowalski: [phone]  - Dr. Moye: [phone]  - Dr. Stewart:  [phone]  In the event of inclement weather, please call our main line at [phone] for an update on the status of any delays or closures.  Dermatology Medication Tips: Please keep the boxes that topical medications come in in order to help keep track of the instructions about where and how to use these. Pharmacies typically print the medication instructions only on the boxes and not directly on the medication tubes.   If your medication is too expensive, please contact our office at [phone] option 4 or send us a message through MyChart.   We are unable to tell what your co-pay for medications will be in advance as this is different depending on your insurance coverage. However, we may be able to find a substitute medication at lower cost or fill out paperwork to get insurance to cover a needed medication.   If a prior authorization is required to get your medication covered by your insurance company, please allow us 1-2 business days to complete this process.  Drug prices often vary depending on where the prescription is filled and some pharmacies may offer cheaper prices.  The website www.goodrx.com contains coupons for medications through different pharmacies. The prices here do not account for what the cost may be with help from insurance (it may be cheaper with your insurance), but the website can give you the price if you did not use any insurance.  - You can print the associated coupon and take it with   your prescription to the pharmacy.  - You may also stop by our office during regular business hours and pick up a GoodRx coupon card.  - If you need your prescription sent electronically to a different pharmacy, notify our office through [facility] [place] MyChart or by phone at [phone] option 4.     Si Usted Necesita Algo Despus de Su Visita  Tambin puede enviarnos un mensaje a travs de MyChart. Por lo general respondemos a los mensajes de MyChart en el transcurso de 1 a 2  das hbiles.  Para renovar recetas, por favor pida a su farmacia que se ponga en contacto con nuestra oficina. Nuestro nmero de fax es el [phone].  Si tiene un asunto urgente cuando la clnica est cerrada y que no puede esperar hasta el siguiente da hbil, puede llamar/localizar a su doctor(a) al nmero que aparece a continuacin.   Por favor, tenga en cuenta que aunque hacemos todo lo posible para estar disponibles para asuntos urgentes fuera del horario de oficina, no estamos disponibles las 24 horas del da, los 7 das de la semana.   Si tiene un problema urgente y no puede comunicarse con nosotros, puede optar por buscar atencin mdica  en el consultorio de su doctor(a), en una clnica privada, en un centro de atencin urgente o en una sala de emergencias.  Si tiene una emergencia mdica, por favor llame inmediatamente al 911 o vaya a la sala de emergencias.  Nmeros de bper  - Dr. Kowalski: [phone]  - Dra. Moye: [phone]  - Dra. Stewart: [phone]  En caso de inclemencias del tiempo, por favor llame a nuestra lnea principal al [phone] para una actualizacin sobre el estado de cualquier retraso o cierre.  Consejos para la medicacin en dermatologa: Por favor, guarde las cajas en las que vienen los medicamentos de uso tpico para ayudarle a seguir las instrucciones sobre dnde y cmo usarlos. Las farmacias generalmente imprimen las instrucciones del medicamento slo en las cajas y no directamente en los tubos del medicamento.   Si su medicamento es muy caro, por favor, pngase en contacto con nuestra oficina llamando al [phone] y presione la opcin 4 o envenos un mensaje a travs de MyChart.   No podemos decirle cul ser su copago por los medicamentos por adelantado ya que esto es diferente dependiendo de la cobertura de su seguro. Sin embargo, es posible que podamos encontrar un medicamento sustituto a menor costo o llenar un formulario para que el  seguro cubra el medicamento que se considera necesario.   Si se requiere una autorizacin previa para que su compaa de seguros cubra su medicamento, por favor permtanos de 1 a 2 das hbiles para completar este proceso.  Los precios de los medicamentos varan con frecuencia dependiendo del lugar de dnde se surte la receta y alguna farmacias pueden ofrecer precios ms baratos.  El sitio web www.goodrx.com tiene cupones para medicamentos de diferentes farmacias. Los precios aqu no tienen en cuenta lo que podra costar con la ayuda del seguro (puede ser ms barato con su seguro), pero el sitio web puede darle el precio si no utiliz ningn seguro.  - Puede imprimir el cupn correspondiente y llevarlo con su receta a la farmacia.  - Tambin puede pasar por nuestra oficina durante el horario de atencin regular y recoger una tarjeta de cupones de GoodRx.  - Si necesita que su receta se enve electrnicamente a una farmacia diferente, informe a nuestra oficina a travs de MyChart de [facility] [place]   o por telfono llamando al [phone] y presione la opcin 4.  

## 2023-04-15 ENCOUNTER — Encounter: Payer: Self-pay | Admitting: Dermatology

## 2023-08-01 ENCOUNTER — Telehealth: Payer: Self-pay

## 2023-08-01 NOTE — Transitions of Care (Post Inpatient/ED Visit) (Signed)
08/01/2023  Name: Nancy [name] MRN: [id number] DOB: 03-Oct-1961  Today's TOC FU Call Status: Today's TOC FU Call Status:: Unsuccessful Call (1st Attempt) Unsuccessful Call (1st Attempt) Date: 08/01/23  Attempted to reach the patient regarding the most recent Inpatient/ED visit.   Voicemail box is full  Follow Up Plan: Additional outreach attempts will be made to reach the patient to complete the Transitions of Care (Post Inpatient/ED visit) call.   Lonia Chimera, RN, BSN, CEN [facility] [phone]

## 2023-08-04 ENCOUNTER — Telehealth: Payer: Self-pay | Admitting: *Deleted

## 2023-08-04 NOTE — Transitions of Care (Post Inpatient/ED Visit) (Signed)
08/04/2023  Name: RAJAH SERA MRN: [id number] DOB: April 13, 1961  Today's TOC FU Call Status: Today's TOC FU Call Status:: Unsuccessful Call (2nd Attempt) Unsuccessful Call (2nd Attempt) Date: 08/04/23  Attempted to reach the patient regarding the most recent Inpatient/ED visit.  Follow Up Plan: Additional outreach attempts will be made to reach the patient to complete the Transitions of Care (Post Inpatient/ED visit) call.   Gean Maidens BSN RN [facility] [phone]

## 2023-08-05 ENCOUNTER — Telehealth: Payer: Self-pay | Admitting: *Deleted

## 2023-08-05 NOTE — Transitions of Care (Post Inpatient/ED Visit) (Signed)
08/05/2023  Name: CAILYNN LEFRANCOIS MRN: [id number] DOB: [birth year]  Today's TOC FU Call Status: Today's TOC FU Call Status:: Unsuccessful Call (3rd Attempt) Unsuccessful Call (3rd Attempt) Date: 08/05/23  Attempted to reach the patient regarding the most recent Inpatient/ED visit.  Follow Up Plan: No further outreach attempts will be made at this time. We have been unable to contact the patient.  Gean Maidens BSN RN [facility] [phone]

## 2023-08-06 ENCOUNTER — Emergency Department: Payer: 59

## 2023-08-06 ENCOUNTER — Encounter: Payer: Self-pay | Admitting: *Deleted

## 2023-08-06 ENCOUNTER — Inpatient Hospital Stay
Admission: EM | Admit: 2023-08-06 | Discharge: 2023-08-13 | DRG: 981 | Disposition: A | Discharge status: Home / Self Care | Payer: 59 | Source: Ambulatory Visit | Attending: Internal Medicine | Admitting: Internal Medicine

## 2023-08-06 ENCOUNTER — Other Ambulatory Visit: Payer: Self-pay

## 2023-08-06 DIAGNOSIS — Z9889 Other specified postprocedural states: Secondary | ICD-10-CM

## 2023-08-06 DIAGNOSIS — Z825 Family history of asthma and other chronic lower respiratory diseases: Secondary | ICD-10-CM

## 2023-08-06 DIAGNOSIS — D72829 Elevated white blood cell count, unspecified: Secondary | ICD-10-CM | POA: Diagnosis present

## 2023-08-06 DIAGNOSIS — Z7951 Long term (current) use of inhaled steroids: Secondary | ICD-10-CM

## 2023-08-06 DIAGNOSIS — Z9049 Acquired absence of other specified parts of digestive tract: Secondary | ICD-10-CM

## 2023-08-06 DIAGNOSIS — R319 Hematuria, unspecified: Secondary | ICD-10-CM | POA: Diagnosis present

## 2023-08-06 DIAGNOSIS — E039 Hypothyroidism, unspecified: Secondary | ICD-10-CM | POA: Diagnosis not present

## 2023-08-06 DIAGNOSIS — Z8249 Family history of ischemic heart disease and other diseases of the circulatory system: Secondary | ICD-10-CM

## 2023-08-06 DIAGNOSIS — J42 Unspecified chronic bronchitis: Secondary | ICD-10-CM

## 2023-08-06 DIAGNOSIS — R1032 Left lower quadrant pain: Principal | ICD-10-CM

## 2023-08-06 DIAGNOSIS — Z7982 Long term (current) use of aspirin: Secondary | ICD-10-CM

## 2023-08-06 DIAGNOSIS — G928 Other toxic encephalopathy: Secondary | ICD-10-CM | POA: Diagnosis not present

## 2023-08-06 DIAGNOSIS — Z79890 Hormone replacement therapy (postmenopausal): Secondary | ICD-10-CM

## 2023-08-06 DIAGNOSIS — K683 Retroperitoneal hematoma: Principal | ICD-10-CM | POA: Diagnosis present

## 2023-08-06 DIAGNOSIS — S301XXA Contusion of abdominal wall, initial encounter: Secondary | ICD-10-CM | POA: Diagnosis not present

## 2023-08-06 DIAGNOSIS — Z87891 Personal history of nicotine dependence: Secondary | ICD-10-CM

## 2023-08-06 DIAGNOSIS — S3011XA Contusion of abdominal wall, initial encounter: Secondary | ICD-10-CM | POA: Diagnosis present

## 2023-08-06 DIAGNOSIS — E876 Hypokalemia: Secondary | ICD-10-CM | POA: Diagnosis present

## 2023-08-06 DIAGNOSIS — Z803 Family history of malignant neoplasm of breast: Secondary | ICD-10-CM

## 2023-08-06 DIAGNOSIS — Z7901 Long term (current) use of anticoagulants: Secondary | ICD-10-CM

## 2023-08-06 DIAGNOSIS — G929 Unspecified toxic encephalopathy: Secondary | ICD-10-CM | POA: Insufficient documentation

## 2023-08-06 DIAGNOSIS — I7102 Dissection of abdominal aorta: Secondary | ICD-10-CM | POA: Diagnosis present

## 2023-08-06 DIAGNOSIS — R791 Abnormal coagulation profile: Secondary | ICD-10-CM | POA: Diagnosis present

## 2023-08-06 DIAGNOSIS — F32A Depression, unspecified: Secondary | ICD-10-CM | POA: Diagnosis present

## 2023-08-06 DIAGNOSIS — Z952 Presence of prosthetic heart valve: Secondary | ICD-10-CM

## 2023-08-06 DIAGNOSIS — Z8679 Personal history of other diseases of the circulatory system: Secondary | ICD-10-CM

## 2023-08-06 DIAGNOSIS — I10 Essential (primary) hypertension: Secondary | ICD-10-CM | POA: Diagnosis present

## 2023-08-06 DIAGNOSIS — Z800 Family history of malignant neoplasm of digestive organs: Secondary | ICD-10-CM

## 2023-08-06 DIAGNOSIS — R58 Hemorrhage, not elsewhere classified: Secondary | ICD-10-CM | POA: Diagnosis present

## 2023-08-06 DIAGNOSIS — T402X5A Adverse effect of other opioids, initial encounter: Secondary | ICD-10-CM | POA: Diagnosis not present

## 2023-08-06 DIAGNOSIS — G4733 Obstructive sleep apnea (adult) (pediatric): Secondary | ICD-10-CM | POA: Insufficient documentation

## 2023-08-06 DIAGNOSIS — Z90710 Acquired absence of both cervix and uterus: Secondary | ICD-10-CM

## 2023-08-06 DIAGNOSIS — Z79899 Other long term (current) drug therapy: Secondary | ICD-10-CM

## 2023-08-06 DIAGNOSIS — L719 Rosacea, unspecified: Secondary | ICD-10-CM | POA: Diagnosis present

## 2023-08-06 DIAGNOSIS — Q231 Congenital insufficiency of aortic valve: Secondary | ICD-10-CM

## 2023-08-06 DIAGNOSIS — Z8701 Personal history of pneumonia (recurrent): Secondary | ICD-10-CM

## 2023-08-06 DIAGNOSIS — Z885 Allergy status to narcotic agent status: Secondary | ICD-10-CM

## 2023-08-06 DIAGNOSIS — J449 Chronic obstructive pulmonary disease, unspecified: Secondary | ICD-10-CM | POA: Diagnosis present

## 2023-08-06 DIAGNOSIS — Z9841 Cataract extraction status, right eye: Secondary | ICD-10-CM

## 2023-08-06 DIAGNOSIS — J9601 Acute respiratory failure with hypoxia: Secondary | ICD-10-CM | POA: Insufficient documentation

## 2023-08-06 DIAGNOSIS — J9621 Acute and chronic respiratory failure with hypoxia: Secondary | ICD-10-CM | POA: Diagnosis not present

## 2023-08-06 DIAGNOSIS — D62 Acute posthemorrhagic anemia: Secondary | ICD-10-CM | POA: Insufficient documentation

## 2023-08-06 LAB — URINALYSIS, ROUTINE W REFLEX MICROSCOPIC
Bacteria, UA: NONE SEEN
Bilirubin Urine: NEGATIVE
Glucose, UA: NEGATIVE mg/dL
Ketones, ur: NEGATIVE mg/dL
Leukocytes,Ua: NEGATIVE
Nitrite: NEGATIVE
Protein, ur: NEGATIVE mg/dL
Specific Gravity, Urine: 1.01 (ref 1.005–1.030)
pH: 5 (ref 5.0–8.0)

## 2023-08-06 LAB — CBC
HCT: 36.5 % (ref 36.0–46.0)
HCT: 34 % — ABNORMAL LOW (ref 36.0–46.0)
Hemoglobin: 12.2 g/dL (ref 12.0–15.0)
Hemoglobin: 11.4 g/dL — ABNORMAL LOW (ref 12.0–15.0)
MCH: 30 pg (ref 26.0–34.0)
MCH: 30.3 pg (ref 26.0–34.0)
MCHC: 33.4 g/dL (ref 30.0–36.0)
MCHC: 33.5 g/dL (ref 30.0–36.0)
MCV: 89.9 fL (ref 80.0–100.0)
MCV: 90.4 fL (ref 80.0–100.0)
Platelets: 277 10*3/uL (ref 150–400)
Platelets: 249 10*3/uL (ref 150–400)
RBC: 4.06 MIL/uL (ref 3.87–5.11)
RBC: 3.76 MIL/uL — ABNORMAL LOW (ref 3.87–5.11)
RDW: 13.4 % (ref 11.5–15.5)
RDW: 13.3 % (ref 11.5–15.5)
WBC: 11 10*3/uL — ABNORMAL HIGH (ref 4.0–10.5)
WBC: 9.8 10*3/uL (ref 4.0–10.5)
nRBC: 0 % (ref 0.0–0.2)
nRBC: 0 % (ref 0.0–0.2)

## 2023-08-06 LAB — COMPREHENSIVE METABOLIC PANEL
ALT: 49 U/L — ABNORMAL HIGH (ref 0–44)
AST: 30 U/L (ref 15–41)
Albumin: 4 g/dL (ref 3.5–5.0)
Alkaline Phosphatase: 113 U/L (ref 38–126)
Anion gap: 11 (ref 5–15)
BUN: 15 mg/dL (ref 6–20)
CO2: 24 mmol/L (ref 22–32)
Calcium: 8.8 mg/dL — ABNORMAL LOW (ref 8.9–10.3)
Chloride: 99 mmol/L (ref 98–111)
Creatinine, Ser: 0.79 mg/dL (ref 0.44–1.00)
GFR, Estimated: >60 mL/min (ref >60)
Glucose, Bld: 103 mg/dL — ABNORMAL HIGH (ref 70–99)
Potassium: 3.4 mmol/L — ABNORMAL LOW (ref 3.5–5.1)
Sodium: 134 mmol/L — ABNORMAL LOW (ref 135–145)
Total Bilirubin: 1.1 mg/dL (ref 0.3–1.2)
Total Protein: 7.4 g/dL (ref 6.5–8.1)

## 2023-08-06 LAB — PROTIME-INR
INR: 1.9 — ABNORMAL HIGH (ref 0.8–1.2)
Prothrombin Time: 22.4 seconds — ABNORMAL HIGH (ref 11.4–15.2)

## 2023-08-06 LAB — LIPASE, BLOOD: Lipase: 22 U/L (ref 11–51)

## 2023-08-06 MED ORDER — SODIUM CHLORIDE 0.9% FLUSH
3.0000 mL | Freq: Two times a day (BID) | INTRAVENOUS | Status: DC
  Administered 2023-08-06 – 2023-08-12 (×10): 3 mL via INTRAVENOUS

## 2023-08-06 MED ORDER — UMECLIDINIUM BROMIDE 62.5 MCG/ACT IN AEPB
1.0000 | INHALATION_SPRAY | Freq: Every day | RESPIRATORY_TRACT | Status: DC
  Filled 2023-08-06 (×2): qty 7

## 2023-08-06 MED ORDER — OXYCODONE HCL 5 MG PO TABS
5.0000 mg | ORAL_TABLET | Freq: Four times a day (QID) | ORAL | Status: DC | PRN
  Administered 2023-08-06 – 2023-08-13 (×11): 5 mg via ORAL
  Filled 2023-08-06 (×11): qty 1

## 2023-08-06 MED ORDER — ONDANSETRON HCL 4 MG/2ML IJ SOLN
4.0000 mg | Freq: Four times a day (QID) | INTRAMUSCULAR | Status: DC | PRN
  Administered 2023-08-06 – 2023-08-09 (×8): 4 mg via INTRAVENOUS
  Filled 2023-08-06 (×9): qty 2

## 2023-08-06 MED ORDER — POLYETHYLENE GLYCOL 3350 17 G PO PACK
17.0000 g | PACK | Freq: Every day | ORAL | Status: DC | PRN
  Administered 2023-08-11 – 2023-08-13 (×3): 17 g via ORAL
  Filled 2023-08-06 (×3): qty 1

## 2023-08-06 MED ORDER — DULOXETINE HCL 20 MG PO CPEP
40.0000 mg | ORAL_CAPSULE | Freq: Two times a day (BID) | ORAL | Status: DC
  Administered 2023-08-06 – 2023-08-07 (×2): 40 mg via ORAL
  Filled 2023-08-06 (×2): qty 2

## 2023-08-06 MED ORDER — ENOXAPARIN SODIUM 80 MG/0.8ML IJ SOSY
80.0000 mg | PREFILLED_SYRINGE | Freq: Two times a day (BID) | INTRAMUSCULAR | Status: DC
  Administered 2023-08-06 – 2023-08-07 (×2): 80 mg via SUBCUTANEOUS
  Filled 2023-08-06 (×4): qty 0.8

## 2023-08-06 MED ORDER — DOXYCYCLINE HYCLATE 20 MG PO TABS
20.0000 mg | ORAL_TABLET | Freq: Two times a day (BID) | ORAL | Status: DC
  Administered 2023-08-06 – 2023-08-13 (×14): 20 mg via ORAL
  Filled 2023-08-06 (×25): qty 1

## 2023-08-06 MED ORDER — MORPHINE SULFATE (PF) 4 MG/ML IV SOLN
8.0000 mg | Freq: Once | INTRAVENOUS | Status: AC
  Administered 2023-08-06: 8 mg via INTRAVENOUS
  Filled 2023-08-06: qty 2

## 2023-08-06 MED ORDER — HYDROMORPHONE HCL 1 MG/ML IJ SOLN: 1.0000 mg | INTRAMUSCULAR | Status: DC | PRN

## 2023-08-06 MED ORDER — ACETAMINOPHEN 325 MG PO TABS
650.0000 mg | ORAL_TABLET | Freq: Four times a day (QID) | ORAL | Status: DC | PRN
  Administered 2023-08-08 – 2023-08-13 (×11): 650 mg via ORAL
  Filled 2023-08-06 (×11): qty 2

## 2023-08-06 MED ORDER — HYDROMORPHONE HCL 1 MG/ML PO LIQD
1.0000 mg | Freq: Four times a day (QID) | ORAL | Status: DC | PRN
  Administered 2023-08-07: 1 mg via ORAL
  Filled 2023-08-06 (×3): qty 1

## 2023-08-06 MED ORDER — FUROSEMIDE 40 MG PO TABS
40.0000 mg | ORAL_TABLET | Freq: Every day | ORAL | Status: DC
  Administered 2023-08-07 – 2023-08-10 (×4): 40 mg via ORAL
  Filled 2023-08-06 (×4): qty 1

## 2023-08-06 MED ORDER — WARFARIN SODIUM 7.5 MG PO TABS
7.5000 mg | ORAL_TABLET | Freq: Once | ORAL | Status: AC
  Administered 2023-08-06: 7.5 mg via ORAL
  Filled 2023-08-06 (×2): qty 1

## 2023-08-06 MED ORDER — WARFARIN - PHARMACIST DOSING INPATIENT: Freq: Every day | Status: DC

## 2023-08-06 MED ORDER — SODIUM CHLORIDE 0.9 % IV BOLUS
1000.0000 mL | Freq: Once | INTRAVENOUS | Status: AC
  Administered 2023-08-06: 1000 mL via INTRAVENOUS

## 2023-08-06 MED ORDER — FLUTICASONE FUROATE-VILANTEROL 100-25 MCG/ACT IN AEPB
1.0000 | INHALATION_SPRAY | Freq: Every day | RESPIRATORY_TRACT | Status: DC
  Filled 2023-08-06 (×2): qty 28

## 2023-08-06 MED ORDER — LEVOTHYROXINE SODIUM 50 MCG PO TABS
125.0000 ug | ORAL_TABLET | Freq: Every day | ORAL | Status: DC
  Administered 2023-08-07 – 2023-08-13 (×7): 125 ug via ORAL
  Filled 2023-08-06 (×7): qty 1

## 2023-08-06 MED ORDER — HYDROMORPHONE HCL 1 MG/ML IJ SOLN
1.0000 mg | INTRAMUSCULAR | Status: DC | PRN
  Administered 2023-08-06 – 2023-08-07 (×3): 1 mg via INTRAVENOUS
  Filled 2023-08-06 (×3): qty 1

## 2023-08-06 MED ORDER — ALBUTEROL SULFATE (2.5 MG/3ML) 0.083% IN NEBU
3.0000 mL | INHALATION_SOLUTION | RESPIRATORY_TRACT | Status: DC | PRN
  Administered 2023-08-08: 3 mL via RESPIRATORY_TRACT
  Filled 2023-08-06: qty 3

## 2023-08-06 MED ORDER — AMLODIPINE BESYLATE 5 MG PO TABS
5.0000 mg | ORAL_TABLET | Freq: Every day | ORAL | Status: DC
  Administered 2023-08-07 – 2023-08-09 (×2): 5 mg via ORAL
  Filled 2023-08-06 (×3): qty 1

## 2023-08-06 MED ORDER — ONDANSETRON HCL 4 MG/2ML IJ SOLN
4.0000 mg | Freq: Once | INTRAMUSCULAR | Status: AC
  Administered 2023-08-06: 4 mg via INTRAVENOUS
  Filled 2023-08-06: qty 2

## 2023-08-06 MED ORDER — IOHEXOL 300 MG/ML  SOLN
100.0000 mL | Freq: Once | INTRAMUSCULAR | Status: AC | PRN
  Administered 2023-08-06: 100 mL via INTRAVENOUS

## 2023-08-06 MED ORDER — MORPHINE SULFATE (PF) 4 MG/ML IV SOLN
6.0000 mg | Freq: Once | INTRAVENOUS | Status: AC
  Administered 2023-08-06: 6 mg via INTRAVENOUS
  Filled 2023-08-06: qty 2

## 2023-08-06 MED ORDER — ASPIRIN 81 MG PO TBEC
81.0000 mg | DELAYED_RELEASE_TABLET | Freq: Once | ORAL | Status: AC
  Administered 2023-08-07: 81 mg via ORAL
  Filled 2023-08-06: qty 1

## 2023-08-06 NOTE — Assessment & Plan Note (Signed)
No shortness of breath reported at this time.  - Continue home regimen

## 2023-08-06 NOTE — ED Notes (Addendum)
First nurse note: Pt brought here from [facility] in clinic where she was seen due to abdominal pain which began yesterday and is worse today.  She has had nausea, hx AAA. No urinary symptoms.

## 2023-08-06 NOTE — Assessment & Plan Note (Signed)
-  Continue home regimen

## 2023-08-06 NOTE — ED Notes (Signed)
See triage note  Presents with llq pain  States pain started yesterday  positive nausea  no vomiting or fever

## 2023-08-06 NOTE — Assessment & Plan Note (Signed)
Patient is presenting with abdominal wall hematoma measuring 7.2 x 4.3 by by 9.6 cm with evidence of active bleeding.  IR was consulted and plan is for conservative management at this time, as this will likely tamponade itself.  Hemoglobin is stable compared to 2 weeks prior with hemodynamic stability.  In the setting of a Lovenox bridge.  Unclear if bleeding is at the site of recent injection or spontaneous.  - IR consulted; appreciate their recommendations - CBC twice daily - Pain control with oxycodone and Dilaudid

## 2023-08-06 NOTE — ED Notes (Signed)
Ice applied to abd  Instructed to keep on for 20 mins at a time

## 2023-08-06 NOTE — Assessment & Plan Note (Signed)
Per chart review, patient is not currently using a CPAP.

## 2023-08-06 NOTE — Assessment & Plan Note (Signed)
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  After recent endobronchial stent placement, warfarin was restarted with the addition of a Lovenox bridge.  INR remains below goal at this time.  - Pharmacy consulted for warfarin dosing - Repeat PT/INR in the morning

## 2023-08-06 NOTE — Consult Note (Signed)
Chief Complaint: Abdominal pain, muscular hematoma  Referring Physician(s): Dr. Roxan Hockey, Emergency  Supervising Physician: Gilmer Mor  Patient Status: [facility] - ED  History of Present Illness: Nancy [name] is a 62 y.o. female presenting with acute left lower quadrant abdominal pain, and CT showing intra-muscular hematoma.   Nancy [name] tells me the pain in the abdomen started 1 or even 2 days ago.  At its worst, 10/10 intensity, and currently after some pain medication, 2/10 intensity.  Describes as sharp pain.  She had some associated nausea but no vomiting.  She denies any trauma to the area.   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    For the mechanical valve, she is on anticoagulation, coumadin.    Recently she was hospitalized for pulmonary team to do endo-bronchial valves, and was bridged off coumadin, now bridging back to coumadin with lovenox BID therapy.   Currently, her vitals: BP: 107 - 110/ 70-77 (Map 88) HR: 98 Resp: 16 O2: 100  Labs: INR: 1.9 PT: 22.4 WBC: 11.0 H&H: 12.2/36.5 Platelet: 277 Na: 134 K: 3.4 Ca: 8.8 Cr: 0.79  Last EKG: 12/15/20 NSR, evidence of atrial enlargement, states non-specific ST changes No ECHO on record  CT (not arterial phase) shows intramuscular hematoma with extrav of contrast/accumulation of contrast in the ant abdominal wall muscles. 4.3cm x 7.1cm  Past Medical History:  Diagnosis Date   Aortic dissection (HCC) 2010   COPD (chronic obstructive pulmonary disease) (HCC)    Depression    Hypertension    Hypothyroidism    Pneumonia    PONV (postoperative nausea and vomiting)    Rosacea    Seasonal allergies    Sleep apnea    Does not use CPAP    Past Surgical History:  Procedure Laterality Date    ABDOMINAL HYSTERECTOMY  2007   AORTIC VALVE REPLACEMENT  06/2009   repeated 2017   APPENDECTOMY  1985   BREAST BIOPSY Right 11/2020   benign   BREAST CYST ASPIRATION     does not remember any details   CARPAL TUNNEL RELEASE Left 04/12/2013   elbow   CATARACT EXTRACTION Right 1994   FOOT SURGERY Left 1997-1998   ORIF ANKLE FRACTURE Right 12/15/2020   Procedure: OPEN REDUCTION INTERNAL FIXATION (ORIF) RIGHT TRIMALLEOLAR ANKLE FRACTURE;  Surgeon: [name], MD;  Location: [facility];  Service: Orthopedics;  Laterality: Right;    Allergies: Codeine  Medications: Prior to Admission medications   Medication Sig Start Date End Date Taking? Authorizing Provider  albuterol (VENTOLIN HFA) 108 (90 Base) MCG/ACT inhaler SMARTSIG:2 Inhalation Via Inhaler Every 6 Hours PRN 08/14/21   [provider]  amLODipine (NORVASC) 5 MG tablet Take 5 mg by mouth daily. 07/30/11   [provider]  aspirin EC 81 MG tablet Take by mouth. 09/28/22 09/28/23  [provider]  budesonide-formoterol (SYMBICORT) 160-4.5 MCG/ACT inhaler Inhale 2 puffs into the lungs daily.  06/01/14   [provider]  carvedilol (COREG) 25 MG tablet Take 25 mg by mouth 2 (two) times daily. 07/30/11   [provider]  doxycycline (PERIOSTAT) 20 MG tablet TAKE 1 TABLET BY MOUTH TWICE DAILY 03/25/23   Deirdre Evener, MD  DULoxetine (CYMBALTA) 20 MG capsule Take 2 capsules (40 mg total) by mouth 2 (two)  times daily. 01/28/23   Jacky Kindle, FNP  furosemide (LASIX) 40 MG tablet Take 40 mg by mouth daily. 07/30/11   [provider]  levothyroxine (SYNTHROID) 125 MCG tablet Take 1 tablet (125 mcg total) by mouth daily before breakfast. 02/23/23   Jacky Kindle, FNP  ondansetron (ZOFRAN ODT) 4 MG disintegrating tablet Take 1 tablet (4 mg total) by mouth every 8 (eight) hours as needed for nausea or vomiting. 10/01/22   Jacky Kindle, FNP  spironolactone (ALDACTONE) 25 MG tablet  Take 25 mg by mouth daily.    [provider]  topiramate (TOPAMAX) 50 MG tablet Take 50 mg by mouth 2 (two) times daily. 10/18/21   [provider]  valACYclovir (VALTREX) 1000 MG tablet     [provider]  warfarin (COUMADIN) 2.5 MG tablet Take 5 mg by mouth daily. 06/01/14   [provider]     Family History  Problem Relation Age of Onset   Pneumonia Mother    Breast cancer Mother 25       twice 69's and 60   Hypertension Father    Heart disease Father    Healthy Sister    Colon cancer Maternal Grandmother    Heart disease Paternal Grandmother    Prostate cancer Neg Hx    Kidney cancer Neg Hx     Social History   Socioeconomic History   Marital status: Single    Spouse name: Not on file   Number of children: 0   Years of education: 13   Highest education level: High school graduate  Occupational History    Employer: [employer]  Tobacco Use   Smoking status: Former    Current packs/day: 0.00    Average packs/day: 0.5 packs/day for 30.0 years (15.0 ttl pk-yrs)    Types: Cigarettes    Start date: 03/11/1986    Quit date: 03/11/2016    Years since quitting: 7.4   Smokeless tobacco: Never   Tobacco comments:    patient states  that she is working at her own pace to quit smoking  Vaping Use   Vaping status: Former  Substance and Sexual Activity   Alcohol use: Yes    Alcohol/week: 0.0 standard drinks of alcohol    Comment: OCCASIONALLY   Drug use: No   Sexual activity: Not on file  Other Topics Concern   Not on file  Social History Narrative   Not on file   Social Determinants of Health   Financial Resource Strain: Low Risk  (07/22/2023)   [facility]   Overall Financial Resource Strain (CARDIA)    Difficulty of Paying Living Expenses: Not very hard  Food Insecurity: No Food Insecurity (07/22/2023)   Received from [facility]   Hunger Vital Sign    Worried About Running Out of  Food in the Last Year: Never true    Ran Out of Food in the Last Year: Never true  Transportation Needs: No Transportation Needs (07/22/2023)   Received from [facility] - Transportation    In the past 12 months, has lack of transportation kept you from medical appointments or from getting medications?: No    Lack of Transportation (Non-Medical): No  Physical Activity: Inactive (01/02/2018)   Exercise Vital Sign    Days of Exercise per Week: 0 days    Minutes of Exercise per Session: 0 min  Stress: Not on file  Social  Connections: Not on file       Review of Systems: A 12 point ROS discussed and pertinent positives are indicated in the HPI above.  All other systems are negative.  Review of Systems  Vital Signs: BP 110/70   Pulse 98   Temp 98 F (36.7 C) (Oral)   Resp 16   Ht 5' 7.5" (1.715 m)   Wt 84.1 kg   SpO2 100%   BMI 28.61 kg/m     Physical Exam General: 62 yo female appearing stated age.  Well-developed, well-nourished.  Comfortable supine in bed.  HEENT: Atraumatic, normocephalic.  Conjugate gaze, extra-ocular motor intact. No scleral icterus or scleral injection. No lesions on external ears, nose, lips, or gums.  Oral mucosa moist, pink.  Neck: Symmetric with no goiter enlargement.  Chest/Lungs:  Symmetric chest with inspiration/expiration.  No labored breathing.    Heart:   No JVD appreciated.  Abdomen:  TTP Left lower quad.  No rebound.  Several focal regions of ecchymosis of the anterior abdominal wall skin, sites of lovenox inj. .   Genito-urinary: Deferred Neurologic: Alert & Oriented to person, place, and time.   Normal affect and insight.  Appropriate questions.  Moving all 4 extremities with gross sensory intact.  Pulse Exam:    Palpable right CFA & left CFA. Palpable distal pulses      Imaging: CT ABDOMEN PELVIS W CONTRAST  Result Date: 08/06/2023 CLINICAL DATA:  Abdominal pain in the left lower quadrant. Pain started  yesterday is worsening today. EXAM: CT ABDOMEN AND PELVIS WITH CONTRAST TECHNIQUE: Multidetector CT imaging of the abdomen and pelvis was performed using the standard protocol following bolus administration of intravenous contrast. RADIATION DOSE REDUCTION: This exam was performed according to the departmental dose-optimization program which includes automated exposure control, adjustment of the mA and/or kV according to patient size and/or use of iterative reconstruction technique. CONTRAST:  OMNIPAQUE IOHEXOL 300 MG/ML  SOLN COMPARISON:  09/24/2006. [facility] CT from 10/16/2015 is available. FINDINGS: Lower chest: Mild cylindrical bronchiectasis noted in the lung bases with pleuroparenchymal scarring in the lower lungs bilaterally. Hepatobiliary: 16 mm low-density lesion in the left liver is stable compatible with benign etiology such as a cyst. No followup imaging is recommended. Gallbladder is distended with possible tiny dependent stone towards the neck (23/2). No intrahepatic or extrahepatic biliary dilation. Pancreas: No focal mass lesion. No dilatation of the main duct. No intraparenchymal cyst. No peripancreatic edema. Spleen: No splenomegaly. No suspicious focal mass lesion. Adrenals/Urinary Tract: No adrenal nodule or mass. Marked right-sided hydronephrosis is stable in the interval without evidence for hydroureter, features suggesting chronic UPJ obstruction. Thinning of the overlying cortex is consistent with a chronic process. 3 mm nonobstructing interpolar stone seen in the right kidney on 32/2. 15 mm low-density well-defined homogeneous subcapsular lesion in the lower pole left kidney has attenuation higher than would be expected for a simple cyst. This lesion was present on the 2016 exam measuring 5 mm at that time. Left ureter unremarkable. The urinary bladder appears normal for the degree of distention. Stomach/Bowel: Stomach is unremarkable. No gastric wall thickening.  No evidence of outlet obstruction. Duodenum is normally positioned as is the ligament of Treitz. No small bowel wall thickening. No small bowel dilatation. The terminal ileum is normal. The appendix is not well visualized, but there is no edema or inflammation in the region of the cecal tip to suggest appendicitis. No gross colonic mass. No colonic wall thickening. A few  scattered diverticuli are seen in the left colon without diverticulitis. Vascular/Lymphatic: Inferior aspect of an aortic stent graft noted in the lower thoracic aorta extending to about the level of the diaphragmatic hiatus. Distal to the stent is a chronic dissection of the abdominal aorta with dissection flap extending into the bifurcation and both common iliac arteries as before. Flap is visible extending into the left external iliac artery as on the prior study. There is no gastrohepatic or hepatoduodenal ligament lymphadenopathy. No retroperitoneal or mesenteric lymphadenopathy. No pelvic sidewall lymphadenopathy. Reproductive: Hysterectomy.  There is no adnexal mass. Other: No intraperitoneal free fluid. Musculoskeletal: A left rectus sheath hematoma measures on the order of 7.2 x 4.3 x 9.6 cm. Layering high attenuation material within the hematoma is compatible with contrast extravasation consistent with active/ongoing bleeding. There is edema and hemorrhage in the extraperitoneal soft tissues of the anterior left pelvic floor and around the left rectus sheath. No worrisome lytic or sclerotic osseous abnormality. IMPRESSION: 1. 7.2 x 4.3 x 9.6 cm left rectus sheath hematoma with evidence of active/ongoing bleeding. 2. 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 Imaging appearance not substantially changed since 10/16/2015 exam. 3. 15 mm low-density well-defined homogeneous subcapsular lesion in the lower pole left kidney has attenuation higher than would be expected for a simple  cyst. This lesion was present on the 2016 exam measuring 5 mm at that time. Slow progression over such a long time frame this reassuring for benign etiology such as proteinaceous or hemorrhagic cyst. Follow-up outpatient nonemergent MRI abdomen with and without contrast could be used to confirm as clinically warranted. 4. Chronic dissection of the abdominal aorta with dissection flap extending into the bifurcation and both common iliac arteries as before. Flap is visible extending into the left external iliac artery as on the prior study. 5. Mild cylindrical bronchiectasis in the lung bases with pleuroparenchymal scarring in the lower lungs bilaterally. 6.  Aortic Atherosclerosis (ICD10-I70.0). I personally called these results to the emergency department at 1600 hours. Dr. Arnoldo Morale, the ordering physician was no longer on duty and Dr. Roxan Hockey was unavailable due to caring for another patient. As such, I relayed these findings to the patient's nurse who will communicate them to Dr. Roxan Hockey. Electronically Signed   By: Kennith Center M.D.   On: 08/06/2023 16:01    Labs:  CBC: Recent Labs    08/06/23 1251  WBC 11.0*  HGB 12.2  HCT 36.5  PLT 277    COAGS: Recent Labs    08/06/23 1339  INR 1.9*    BMP: Recent Labs    08/06/23 1251  NA 134*  K 3.4*  CL 99  CO2 24  GLUCOSE 103*  BUN 15  CALCIUM 8.8*  CREATININE 0.79  GFRNONAA >60    LIVER FUNCTION TESTS: Recent Labs    08/06/23 1251  BILITOT 1.1  AST 30  ALT 49*  ALKPHOS 113  PROT 7.4  ALBUMIN 4.0    TUMOR MARKERS: No results for input(s): "AFPTM", "CEA", "CA199", "CHROMGRNA" in the last 8760 hours.  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   She is on anti-coagulation for  the mechanical valve, and now presents with spontaneous anterior abdominal wall hematoma.  She is currently hemodynamically normal with normal H&H, mild symptoms with pain medication at this time.   We discussed the nature of intra-muscular bleed in a closed compartment, and that we would expect this would stop spontaneously, as there is concern to withdraw from the [facility] given risk of stroke in the setting of mechanical AV.  Transcatheter embolization is an option, which has obstacles related to the presence of abdominal aortic dissection and the blood thinners.   I think conservative management is reasonable given her stable presentation, with serial H&H, continued blood pressure control, management symptoms, observation, local ice for symptoms and decrease hyperemia. She understands and agrees.    If there is any change in clinical status overnight or tomorrow, we might need to discuss angio and embolization.  She understands.   Our VIR call team is aware, and can be contacted with any concerns.     Thank you for this interesting consult.  I greatly enjoyed meeting Nancy [name] and look forward to participating in their care.  A copy of this report was sent to the requesting provider on this date.  Electronically Signed: Gilmer Mor, DO 08/06/2023, 5:01 PM   I spent a total of 55 Miinutes    in face to face in clinical consultation, greater than 50% of which was counseling/coordinating care for ant abdominal wall hematoma, possible angio/embolization.

## 2023-08-06 NOTE — ED Provider Notes (Signed)
CT imaging with evidence of abdominal wall hematoma with active extra of.  Will consult IR as the patient will be high risk for reversal in the setting of mechanical valve.   Patient has been evaluated by IR bedside.  As she is currently hemodynamically stable recommending observation the hospital for serial H&H will consider intervention should she have any significant drop in H&H or with any vital sign instability but the patient appears clinically well and appropriate for conservative management at this time as his extraperitoneal.  Will consult hospitalist for admission.   Willy Eddy, MD 08/06/23 [phone]

## 2023-08-06 NOTE — H&P (Signed)
History and Physical    Patient: Nancy [name] ZOX:[id number] DOB: 1961-01-16 DOA: 08/06/2023 DOS: the patient was seen and examined on 08/06/2023 PCP: Jacky Kindle, FNP  Patient coming from: Home  Chief Complaint:  Chief Complaint  Patient presents with   Abdominal Pain   HPI: Nancy [name] is a 62 y.o. 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  Ms. Kruppa states she began to have left lower quadrant abdominal pain yesterday that was intermittent, however over the evening and early morning, the pain woke her up from her sleep and became severe in nature.  She states she is not experiencing any other symptoms and the pain does not radiate anywhere else.  She has been on a Lovenox bridge and took her last dose this morning.  She notes that the area that the pain was located and was not quite in the area where she had her most recent injection.  Otherwise, she feels well.  ED course: On arrival to the ED, patient was normotensive at 107/77 with heart rate of 104.  She was saturating at 100% on room air.  She was afebrile at 98.7.  Initial workup demonstrated WBC of 11.0, potassium 3.4, glucose 103, creatinine 0.78 GFR above 60, ALT 49 and INR of 1.9.  Urinalysis with hematuria only.  CT of the abdomen was obtained That demonstrated a large 7.2 x 4.3 x 9.6 left rectus sheath hematoma with evidence of active/ongoing bleeding; additional chronic findings noted.  IR was consulted with plans for observation.  TRH contacted for admission  Review of Systems: As mentioned in the history of present illness. All other systems reviewed and are negative.  Past Medical History:  Diagnosis Date   Aortic dissection (HCC) 2010    COPD (chronic obstructive pulmonary disease) (HCC)    Depression    Hypertension    Hypothyroidism    Pneumonia    PONV (postoperative nausea and vomiting)    Rosacea    Seasonal allergies    Sleep apnea    Does not use CPAP   Past Surgical History:  Procedure Laterality Date   ABDOMINAL HYSTERECTOMY  2007   AORTIC VALVE REPLACEMENT  06/2009   repeated 2017   APPENDECTOMY  1985   BREAST BIOPSY Right 11/2020   benign   BREAST CYST ASPIRATION     does not remember any details   CARPAL TUNNEL RELEASE Left 04/12/2013   elbow   CATARACT EXTRACTION Right 1994   FOOT SURGERY Left 1997-1998   ORIF ANKLE FRACTURE Right 12/15/2020   Procedure: OPEN REDUCTION INTERNAL FIXATION (ORIF) RIGHT TRIMALLEOLAR ANKLE FRACTURE;  Surgeon: [name], MD;  Location: [facility];  Service: Orthopedics;  Laterality: Right;   Social History:  reports that she quit smoking about 7 years ago. Her smoking use included cigarettes. She started smoking about 37 years ago. She has a 15 pack-year smoking history. She has never used smokeless tobacco. She reports current alcohol use. She reports that she does not use drugs.  Allergies  Allergen Reactions   Codeine Nausea Only    Can take cough syrup.    Family History  Problem Relation Age of Onset   Pneumonia Mother    Breast cancer Mother 63  twice 50's and 77   Hypertension Father    Heart disease Father    Healthy Sister    Colon cancer Maternal Grandmother    Heart disease Paternal Grandmother    Prostate cancer Neg Hx    Kidney cancer Neg Hx     Prior to Admission medications   Medication Sig Start Date End Date Taking? Authorizing Provider  albuterol (VENTOLIN HFA) 108 (90 Base) MCG/ACT inhaler SMARTSIG:2 Inhalation Via Inhaler Every 6 Hours PRN 08/14/21   [provider]  amLODipine (NORVASC) 5 MG tablet Take 5 mg by mouth daily. 07/30/11   [provider]  aspirin EC 81 MG tablet Take by mouth. 09/28/22  09/28/23  [provider]  budesonide-formoterol (SYMBICORT) 160-4.5 MCG/ACT inhaler Inhale 2 puffs into the lungs daily.  06/01/14   [provider]  carvedilol (COREG) 25 MG tablet Take 25 mg by mouth 2 (two) times daily. 07/30/11   [provider]  doxycycline (PERIOSTAT) 20 MG tablet TAKE 1 TABLET BY MOUTH TWICE DAILY 03/25/23   Deirdre Evener, MD  DULoxetine (CYMBALTA) 20 MG capsule Take 2 capsules (40 mg total) by mouth 2 (two) times daily. 01/28/23   Jacky Kindle, FNP  furosemide (LASIX) 40 MG tablet Take 40 mg by mouth daily. 07/30/11   [provider]  levothyroxine (SYNTHROID) 125 MCG tablet Take 1 tablet (125 mcg total) by mouth daily before breakfast. 02/23/23   Jacky Kindle, FNP  ondansetron (ZOFRAN ODT) 4 MG disintegrating tablet Take 1 tablet (4 mg total) by mouth every 8 (eight) hours as needed for nausea or vomiting. 10/01/22   Jacky Kindle, FNP  spironolactone (ALDACTONE) 25 MG tablet Take 25 mg by mouth daily.    [provider]  topiramate (TOPAMAX) 50 MG tablet Take 50 mg by mouth 2 (two) times daily. 10/18/21   [provider]  valACYclovir (VALTREX) 1000 MG tablet     [provider]  warfarin (COUMADIN) 2.5 MG tablet Take 5 mg by mouth daily. 06/01/14   [provider]    Physical Exam: Vitals:   08/06/23 1313 08/06/23 1700 08/06/23 1806 08/06/23 1827  BP:  110/70 112/72 123/73  Pulse:  98 88 93  Resp:  16 16 16   Temp:  98 F (36.7 C) 97.8 F (36.6 C) 98 F (36.7 C)  TempSrc:  Oral  Oral  SpO2:  100% 100% 91%  Weight: 84.1 kg     Height: 5' 7.5" (1.715 m)      Physical Exam Vitals and nursing note reviewed.  Constitutional:      General: She is not in acute distress. HENT:     Mouth/Throat:     Mouth: Mucous membranes are moist.     Pharynx: Oropharynx is clear.  Eyes:     Conjunctiva/sclera: Conjunctivae normal.     Pupils: Pupils are equal, round, and reactive to light.   Cardiovascular:     Rate and Rhythm: Normal rate and regular rhythm.     Heart sounds: Murmur heard.  Pulmonary:     Effort: Pulmonary effort is normal. No respiratory distress.  Abdominal:     General: Bowel sounds are normal.     Palpations: Abdomen is soft.     Tenderness: There is abdominal tenderness in the left lower quadrant.     Hernia: No hernia is present.  Musculoskeletal:     Right lower leg: No edema.     Left lower leg: No edema.  Skin:  General: Skin is warm and dry.     Comments: Small bruises located in various locations on patient's abdomen, and various stages of healing  Neurological:     General: No focal deficit present.     Mental Status: She is alert and oriented to person, place, and time.  Psychiatric:        Mood and Affect: Mood normal.        Behavior: Behavior normal.    Data Reviewed: CBC with WBC of 11.0, hemoglobin 12.2, and platelets of 277 CMP with sodium of 134, potassium 3.4, bicarb 24, glucose 103, BUN 15, creatinine 0.79, AST 30, ALT 49 GFR above 60 INR 1.9 Urinalysis with small hematuria only  CT ABDOMEN PELVIS W CONTRAST  Result Date: 08/06/2023 CLINICAL DATA:  Abdominal pain in the left lower quadrant. Pain started yesterday is worsening today. EXAM: CT ABDOMEN AND PELVIS WITH CONTRAST TECHNIQUE: Multidetector CT imaging of the abdomen and pelvis was performed using the standard protocol following bolus administration of intravenous contrast. RADIATION DOSE REDUCTION: This exam was performed according to the departmental dose-optimization program which includes automated exposure control, adjustment of the mA and/or kV according to patient size and/or use of iterative reconstruction technique. CONTRAST:  OMNIPAQUE IOHEXOL 300 MG/ML  SOLN COMPARISON:  09/24/2006. [facility] CT from 10/16/2015 is available. FINDINGS: Lower chest: Mild cylindrical bronchiectasis noted in the lung bases with pleuroparenchymal scarring  in the lower lungs bilaterally. Hepatobiliary: 16 mm low-density lesion in the left liver is stable compatible with benign etiology such as a cyst. No followup imaging is recommended. Gallbladder is distended with possible tiny dependent stone towards the neck (23/2). No intrahepatic or extrahepatic biliary dilation. Pancreas: No focal mass lesion. No dilatation of the main duct. No intraparenchymal cyst. No peripancreatic edema. Spleen: No splenomegaly. No suspicious focal mass lesion. Adrenals/Urinary Tract: No adrenal nodule or mass. Marked right-sided hydronephrosis is stable in the interval without evidence for hydroureter, features suggesting chronic UPJ obstruction. Thinning of the overlying cortex is consistent with a chronic process. 3 mm nonobstructing interpolar stone seen in the right kidney on 32/2. 15 mm low-density well-defined homogeneous subcapsular lesion in the lower pole left kidney has attenuation higher than would be expected for a simple cyst. This lesion was present on the 2016 exam measuring 5 mm at that time. Left ureter unremarkable. The urinary bladder appears normal for the degree of distention. Stomach/Bowel: Stomach is unremarkable. No gastric wall thickening. No evidence of outlet obstruction. Duodenum is normally positioned as is the ligament of Treitz. No small bowel wall thickening. No small bowel dilatation. The terminal ileum is normal. The appendix is not well visualized, but there is no edema or inflammation in the region of the cecal tip to suggest appendicitis. No gross colonic mass. No colonic wall thickening. A few scattered diverticuli are seen in the left colon without diverticulitis. Vascular/Lymphatic: Inferior aspect of an aortic stent graft noted in the lower thoracic aorta extending to about the level of the diaphragmatic hiatus. Distal to the stent is a chronic dissection of the abdominal aorta with dissection flap extending into the bifurcation and both common  iliac arteries as before. Flap is visible extending into the left external iliac artery as on the prior study. There is no gastrohepatic or hepatoduodenal ligament lymphadenopathy. No retroperitoneal or mesenteric lymphadenopathy. No pelvic sidewall lymphadenopathy. Reproductive: Hysterectomy.  There is no adnexal mass. Other: No intraperitoneal free fluid. Musculoskeletal: A left rectus sheath hematoma measures on the order  of 7.2 x 4.3 x 9.6 cm. Layering high attenuation material within the hematoma is compatible with contrast extravasation consistent with active/ongoing bleeding. There is edema and hemorrhage in the extraperitoneal soft tissues of the anterior left pelvic floor and around the left rectus sheath. No worrisome lytic or sclerotic osseous abnormality. IMPRESSION: 1. 7.2 x 4.3 x 9.6 cm left rectus sheath hematoma with evidence of active/ongoing bleeding. 2. 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 Imaging appearance not substantially changed since 10/16/2015 exam. 3. 15 mm low-density well-defined homogeneous subcapsular lesion in the lower pole left kidney has attenuation higher than would be expected for a simple cyst. This lesion was present on the 2016 exam measuring 5 mm at that time. Slow progression over such a long time frame this reassuring for benign etiology such as proteinaceous or hemorrhagic cyst. Follow-up outpatient nonemergent MRI abdomen with and without contrast could be used to confirm as clinically warranted. 4. Chronic dissection of the abdominal aorta with dissection flap extending into the bifurcation and both common iliac arteries as before. Flap is visible extending into the left external iliac artery as on the prior study. 5. Mild cylindrical bronchiectasis in the lung bases with pleuroparenchymal scarring in the lower lungs bilaterally. 6.  Aortic Atherosclerosis (ICD10-I70.0). I personally called these  results to the emergency department at 1600 hours. Dr. Arnoldo Morale, the ordering physician was no longer on duty and Dr. Roxan Hockey was unavailable due to caring for another patient. As such, I relayed these findings to the patient's nurse who will communicate them to Dr. Roxan Hockey. Electronically Signed   By: Kennith Center M.D.   On: 08/06/2023 16:01    There are no new results to review at this time.  Assessment and Plan:  * Abdominal wall hematoma Patient is presenting with abdominal wall hematoma measuring 7.2 x 4.3 by by 9.6 cm with evidence of active bleeding.  IR was consulted and plan is for conservative management at this time, as this will likely tamponade itself.  Hemoglobin is stable compared to 2 weeks prior with hemodynamic stability.  In the setting of a Lovenox bridge.  Unclear if bleeding is at the site of recent injection or spontaneous.  - IR consulted; appreciate their recommendations - CBC twice daily - Pain control with oxycodone and Dilaudid  H/O mechanical aortic valve replacement Patient has a history of a bicuspid aortic valve in addition to aortic dissection for which she underwent AVR and is currently on Coumadin with goal of 2.5-3.5.  After recent endobronchial stent placement, warfarin was restarted with the addition of a Lovenox bridge.  INR remains below goal at this time.  - Pharmacy consulted for warfarin dosing - Repeat PT/INR in the morning  OSA (obstructive sleep apnea) Per chart review, patient is not currently using a CPAP.  Chronic obstructive pulmonary disease (HCC) No shortness of breath reported at this time.  - Continue home regimen  Essential (primary) hypertension - Continue home regimen  Adult hypothyroidism - Continue home regimen  Advance Care Planning:   Code Status: Full Code verified by patient  Consults: IR  Family Communication: No family at bedside  Severity of Illness: The appropriate patient status for this patient is  OBSERVATION. Observation status is judged to be reasonable and necessary in order to provide the required intensity of service to ensure the patient's safety. The patient's presenting symptoms, physical exam findings, and initial radiographic and laboratory data in the context of their medical  condition is felt to place them at decreased risk for further clinical deterioration. Furthermore, it is anticipated that the patient will be medically stable for discharge from the hospital within 2 midnights of admission.   Author: Verdene Lennert, MD 08/06/2023 6:51 PM  For on call review [email or web address].

## 2023-08-06 NOTE — ED Triage Notes (Signed)
Left sided abdominal pain which has been worsening since yesterday

## 2023-08-06 NOTE — ED Provider Notes (Signed)
[facility] Provider Note    Event Date/Time   First MD Initiated Contact with Patient 08/06/23 1248     (approximate)   History   Abdominal Pain   HPI  Nancy [name] is a 62 y.o. female past medical history significant for COPD, hypothyroidism, hypertension, OSA, total aortic arch reconstruction on Coumadin, recent lung volume reduction and endobronchial valves, who presents to the emergency department with abdominal pain.  States that yesterday started having abdominal pain that has significantly worsened today.  Left lower abdominal pain that is nonradiating.  Associate with nausea but no episodes of vomiting.  No history of kidney stones.  Denies any falls or trauma.  Recently on Lovenox injections.  Denies dysuria, urinary urgency or frequency.  No fever or chills.  No diarrhea or constipation.  Denies any blood in her stool or melena.  No similar pain in the past.  Prior total hysterectomy     Physical Exam   Triage Vital Signs: ED Triage Vitals  Encounter Vitals Group     BP 08/06/23 1250 107/77     Systolic BP Percentile --      Diastolic BP Percentile --      Pulse Rate 08/06/23 1250 (!) 104     Resp 08/06/23 1250 16     Temp 08/06/23 1250 98.7 F (37.1 C)     Temp Source 08/06/23 1250 Oral     SpO2 08/06/23 1250 100 %     Weight 08/06/23 1313 185 lb 6.5 oz (84.1 kg)     Height 08/06/23 1313 5' 7.5" (1.715 m)     Head Circumference --      Peak Flow --      Pain Score 08/06/23 1243 8     Pain Loc --      Pain Education --      Exclude from Growth Chart --     Most recent vital signs: Vitals:   08/06/23 1250  BP: 107/77  Pulse: (!) 104  Resp: 16  Temp: 98.7 F (37.1 C)  SpO2: 100%    Physical Exam Constitutional:      General: She is in acute distress.     Appearance: She is well-developed.  HENT:     Head: Atraumatic.  Eyes:     Conjunctiva/sclera: Conjunctivae normal.  Cardiovascular:     Rate and Rhythm: Regular  rhythm.  Pulmonary:     Effort: No respiratory distress.  Abdominal:     General: There is no distension.     Tenderness: There is abdominal tenderness in the left lower quadrant. There is no right CVA tenderness or left CVA tenderness.     Comments: Ecchymosis to the abdominal wall (recent Lovenox injections)  Musculoskeletal:        General: Normal range of motion.     Cervical back: Normal range of motion.  Skin:    General: Skin is warm.  Neurological:     Mental Status: She is alert. Mental status is at baseline.     IMPRESSION / MDM / ASSESSMENT AND PLAN / ED COURSE  I reviewed the triage vital signs and the nursing notes.  Differential diagnosis including diverticulitis, intra-abdominal abscess, intra-abdominal bleed, kidney stone, pyelonephritis  No tachycardic or bradycardic dysrhythmias while on cardiac telemetry.  RADIOLOGY I independently reviewed imaging, my interpretation of imaging: CT abdomen pelvis with contrast  LABS (all labs ordered are listed, but only abnormal results are displayed) Labs interpreted as -  Labs Reviewed  COMPREHENSIVE METABOLIC PANEL - Abnormal; Notable for the following components:      Result Value   Sodium 134 (*)    Potassium 3.4 (*)    Glucose, Bld 103 (*)    Calcium 8.8 (*)    ALT 49 (*)    All other components within normal limits  CBC - Abnormal; Notable for the following components:   WBC 11.0 (*)    All other components within normal limits  URINALYSIS, ROUTINE W REFLEX MICROSCOPIC - Abnormal; Notable for the following components:   Color, Urine YELLOW (*)    APPearance CLEAR (*)    Hgb urine dipstick SMALL (*)    All other components within normal limits  PROTIME-INR - Abnormal; Notable for the following components:   Prothrombin Time 22.4 (*)    INR 1.9 (*)    All other components within normal limits  LIPASE, BLOOD     MDM  Treated with IV fluids, IV nausea medication, IV morphine  Labs with  leukocytosis.  No significant electrolyte abnormalities.  Creatinine at baseline.  UA with no signs of urinary tract infection.  INR subtherapeutic at 1.9.  Lipase within normal limits.  CT abdomen and pelvis currently pending.     PROCEDURES:  Critical Care performed: No  Procedures  Patient's presentation is most consistent with acute presentation with potential threat to life or bodily function.   MEDICATIONS ORDERED IN ED: Medications  morphine (PF) 4 MG/ML injection 6 mg (6 mg Intravenous Given 08/06/23 1354)  sodium chloride 0.9 % bolus 1,000 mL (1,000 mLs Intravenous New Bag/Given 08/06/23 1354)  ondansetron (ZOFRAN) injection 4 mg (4 mg Intravenous Given 08/06/23 1354)  iohexol (OMNIPAQUE) 300 MG/ML solution 100 mL (100 mLs Intravenous Contrast Given 08/06/23 1426)    FINAL CLINICAL IMPRESSION(S) / ED DIAGNOSES   Final diagnoses:  Left lower quadrant abdominal pain     Rx / DC Orders   ED Discharge Orders     None        Note:  This document was prepared using Dragon voice recognition software and may include unintentional dictation errors.   Corena Herter, MD 08/06/23 1521

## 2023-08-06 NOTE — Consult Note (Signed)
ANTICOAGULATION CONSULT NOTE - Initial Consult  Pharmacy Consult for Warfarin  Indication: Mechanical Aortic Valve  Patient Measurements: Height: 5' 7.5" (171.5 cm) Weight: 84.1 kg (185 lb 6.5 oz) IBW/kg (Calculated) : 62.75  Vital Signs: Temp: 98 F (36.7 C) (09/25 1827) Temp Source: Oral (09/25 1827) BP: 123/73 (09/25 1827) Pulse Rate: 93 (09/25 1827)  Labs: Recent Labs    08/06/23 1251 08/06/23 1339 08/06/23 1832  HGB 12.2  --  11.4*  HCT 36.5  --  34.0*  PLT 277  --  249  LABPROT  --  22.4*  --   INR  --  1.9*  --   CREATININE 0.79  --   --    Estimated Creatinine Clearance: 84.2 mL/min (by C-G formula based on SCr of 0.79 mg/dL).  Interacting Medications:  PTA Meds - Lovenox, levothyroxine, doxycycline  Assessment: Nancy [name] is a 62 year old female presenting with an abdominal wall hematoma with evidence of active bleeding. IR was consulted and plan is for conservative management at this time. Patient has history of bicuspid aortic valve plus an aortic dissection for which she underwent AVR and is currently on warfarin with a goal INR of 2.5-3.5. Per fill history and outpatient records, home regimen is 5 mg daily. Patient took higher dose of 7.5 mg yesterday due to subtherapeutic INR. After recent endobronchial stent placement, warfarin was restarted with a Lovenox bridge - pt reports last Lovenox dose was taken this morning.    Goal of Therapy:  INR 2.5-3.5 Monitor platelets by anticoagulation protocol: Yes   Plan:  INR subtherapeutic at 1.9 today  Give warfarin 7.5 mg tonight  Continue PTA Lovenox bridge 80 mg Q12H given subtherapeutic INR Monitor INR daily Continue to monitor H/H and platelets   Littie Deeds, PharmD PGY1 Pharmacy Resident  08/06/2023,7:47 PM

## 2023-08-06 NOTE — Assessment & Plan Note (Signed)
-   Continue home regimen 

## 2023-08-07 DIAGNOSIS — E039 Hypothyroidism, unspecified: Secondary | ICD-10-CM | POA: Diagnosis present

## 2023-08-07 DIAGNOSIS — G4733 Obstructive sleep apnea (adult) (pediatric): Secondary | ICD-10-CM | POA: Diagnosis present

## 2023-08-07 DIAGNOSIS — D62 Acute posthemorrhagic anemia: Secondary | ICD-10-CM | POA: Diagnosis not present

## 2023-08-07 DIAGNOSIS — E876 Hypokalemia: Secondary | ICD-10-CM | POA: Diagnosis present

## 2023-08-07 DIAGNOSIS — Z79890 Hormone replacement therapy (postmenopausal): Secondary | ICD-10-CM | POA: Diagnosis not present

## 2023-08-07 DIAGNOSIS — L719 Rosacea, unspecified: Secondary | ICD-10-CM | POA: Diagnosis present

## 2023-08-07 DIAGNOSIS — R58 Hemorrhage, not elsewhere classified: Secondary | ICD-10-CM | POA: Diagnosis present

## 2023-08-07 DIAGNOSIS — R319 Hematuria, unspecified: Secondary | ICD-10-CM | POA: Diagnosis present

## 2023-08-07 DIAGNOSIS — F32A Depression, unspecified: Secondary | ICD-10-CM | POA: Diagnosis present

## 2023-08-07 DIAGNOSIS — I10 Essential (primary) hypertension: Secondary | ICD-10-CM | POA: Diagnosis present

## 2023-08-07 DIAGNOSIS — T402X5A Adverse effect of other opioids, initial encounter: Secondary | ICD-10-CM | POA: Diagnosis not present

## 2023-08-07 DIAGNOSIS — Z952 Presence of prosthetic heart valve: Secondary | ICD-10-CM | POA: Diagnosis not present

## 2023-08-07 DIAGNOSIS — Z8249 Family history of ischemic heart disease and other diseases of the circulatory system: Secondary | ICD-10-CM | POA: Diagnosis not present

## 2023-08-07 DIAGNOSIS — D72829 Elevated white blood cell count, unspecified: Secondary | ICD-10-CM | POA: Diagnosis present

## 2023-08-07 DIAGNOSIS — J9621 Acute and chronic respiratory failure with hypoxia: Secondary | ICD-10-CM | POA: Diagnosis not present

## 2023-08-07 DIAGNOSIS — Z7901 Long term (current) use of anticoagulants: Secondary | ICD-10-CM | POA: Diagnosis not present

## 2023-08-07 DIAGNOSIS — I7102 Dissection of abdominal aorta: Secondary | ICD-10-CM | POA: Diagnosis present

## 2023-08-07 DIAGNOSIS — Q231 Congenital insufficiency of aortic valve: Secondary | ICD-10-CM | POA: Diagnosis not present

## 2023-08-07 DIAGNOSIS — S301XXA Contusion of abdominal wall, initial encounter: Secondary | ICD-10-CM | POA: Diagnosis present

## 2023-08-07 DIAGNOSIS — R791 Abnormal coagulation profile: Secondary | ICD-10-CM | POA: Diagnosis present

## 2023-08-07 DIAGNOSIS — G928 Other toxic encephalopathy: Secondary | ICD-10-CM | POA: Diagnosis not present

## 2023-08-07 DIAGNOSIS — K683 Retroperitoneal hematoma: Secondary | ICD-10-CM | POA: Diagnosis present

## 2023-08-07 DIAGNOSIS — Z87891 Personal history of nicotine dependence: Secondary | ICD-10-CM | POA: Diagnosis not present

## 2023-08-07 DIAGNOSIS — J449 Chronic obstructive pulmonary disease, unspecified: Secondary | ICD-10-CM | POA: Diagnosis present

## 2023-08-07 DIAGNOSIS — Z7951 Long term (current) use of inhaled steroids: Secondary | ICD-10-CM | POA: Diagnosis not present

## 2023-08-07 LAB — CBC
HCT: 30.7 % — ABNORMAL LOW (ref 36.0–46.0)
Hemoglobin: 10.4 g/dL — ABNORMAL LOW (ref 12.0–15.0)
MCH: 30.3 pg (ref 26.0–34.0)
MCHC: 33.9 g/dL (ref 30.0–36.0)
MCV: 89.5 fL (ref 80.0–100.0)
Platelets: 246 10*3/uL (ref 150–400)
RBC: 3.43 MIL/uL — ABNORMAL LOW (ref 3.87–5.11)
RDW: 13.6 % (ref 11.5–15.5)
WBC: 11.5 10*3/uL — ABNORMAL HIGH (ref 4.0–10.5)
nRBC: 0 % (ref 0.0–0.2)

## 2023-08-07 LAB — HIV ANTIBODY (ROUTINE TESTING W REFLEX): HIV Screen 4th Generation wRfx: NONREACTIVE

## 2023-08-07 LAB — PROTIME-INR
INR: 2.1 — ABNORMAL HIGH (ref 0.8–1.2)
Prothrombin Time: 23.5 seconds — ABNORMAL HIGH (ref 11.4–15.2)

## 2023-08-07 MED ORDER — HYDROMORPHONE HCL 1 MG/ML IJ SOLN
1.5000 mg | INTRAMUSCULAR | Status: DC | PRN
  Administered 2023-08-07 – 2023-08-08 (×6): 1.5 mg via INTRAVENOUS
  Filled 2023-08-07 (×7): qty 1.5

## 2023-08-07 MED ORDER — METOPROLOL SUCCINATE ER 25 MG PO TB24
25.0000 mg | ORAL_TABLET | Freq: Every day | ORAL | Status: DC
  Administered 2023-08-07 – 2023-08-13 (×7): 25 mg via ORAL
  Filled 2023-08-07 (×7): qty 1

## 2023-08-07 MED ORDER — DULOXETINE HCL 30 MG PO CPEP
60.0000 mg | ORAL_CAPSULE | Freq: Every day | ORAL | Status: DC
  Administered 2023-08-08 – 2023-08-13 (×6): 60 mg via ORAL
  Filled 2023-08-07 (×6): qty 2

## 2023-08-07 MED ORDER — HYDROMORPHONE HCL 1 MG/ML IJ SOLN
0.5000 mg | Freq: Once | INTRAMUSCULAR | Status: AC
  Administered 2023-08-07: 0.5 mg via INTRAVENOUS
  Filled 2023-08-07: qty 0.5

## 2023-08-07 MED ORDER — WARFARIN SODIUM 7.5 MG PO TABS
7.5000 mg | ORAL_TABLET | Freq: Once | ORAL | Status: AC
  Administered 2023-08-07: 7.5 mg via ORAL
  Filled 2023-08-07: qty 1

## 2023-08-07 MED ORDER — FLUTICASONE-UMECLIDIN-VILANT 100-62.5-25 MCG/ACT IN AEPB
1.0000 | INHALATION_SPRAY | Freq: Every day | RESPIRATORY_TRACT | Status: DC
  Administered 2023-08-08 – 2023-08-13 (×6): 1 via RESPIRATORY_TRACT
  Filled 2023-08-07 (×2): qty 1

## 2023-08-07 MED ORDER — DULOXETINE HCL 20 MG PO CPEP
40.0000 mg | ORAL_CAPSULE | Freq: Every day | ORAL | Status: DC
  Administered 2023-08-07 – 2023-08-12 (×6): 40 mg via ORAL
  Filled 2023-08-07 (×6): qty 2

## 2023-08-07 NOTE — Plan of Care (Signed)

## 2023-08-07 NOTE — Progress Notes (Signed)
Progress Note    Nancy [name]  ZOX:[id number] DOB: [birth year]  DOA: 08/06/2023 PCP: Jacky Kindle, FNP      Brief Narrative:    Medical records reviewed and are as summarized below:  Nancy [name] is a 62 y.o. 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  She was discharged on Lovenox bridge 90 mg every 12 hours for 7 days along with warfarin 5 mg daily with goal INR of 2-3 as recommended by her CT surgeon.  She presented to the hospital with abdominal pain mainly in the left lower quadrant.  Abdominal pain started a day prior to admission.  She had been taking her Lovenox injection as prescribed.   She was found to have abdominal wall hematoma.      Assessment/Plan:   Principal Problem:   Abdominal wall hematoma Active Problems:   H/O mechanical aortic valve replacement   Adult hypothyroidism   Essential (primary) hypertension   Chronic obstructive pulmonary disease (HCC)   OSA (obstructive sleep apnea)    Body mass index is 28.61 kg/m.   Abdominal wall hematoma measuring 7.2 x 5.3 x 9.6 cm, abdominal pain.: She was evaluated by interventional radiologist.  No plan for surgical intervention at this time.  Analgesics as needed.  Increase IV Dilaudid from 1 mg to 1.5 mg every 3 hours for severe abdominal pain.  Oxycodone as needed for pain.  Zofran as needed for nausea.   Acute blood loss anemia: Hemoglobin dropped from 12.2-10.4.  No  indication for blood transfusion at this time.  Monitor H&H.   S/p Saint Jude mechanical aortic valve replacement: INR is therapeutic at 2.1.  Discontinue Lovenox.  Continue warfarin and monitor INR closely. Goal INR is 2-3.  Chart from [facility] were reviewed.  These include notes from 07/24/2023 and Dr. Thea Silversmith (CT surgeon) note from 02/17/2023.   Hypertension: Patient said she takes metoprolol XL 25 mg daily and she no longer takes carvedilol.  Metoprolol XL 25 mg has been ordered per patient's request.  Continue amlodipine.   OSA: She uses 2 L/min oxygen at night.   COPD: Stable.  Continue bronchodilators   Other comorbidities include hypothyroidism, s/p bronchoscopic lung volume reduction surgery with endobronchial valves to RML/RUL on 07/24/2023  Diet Order             Diet regular Room service appropriate? Yes; Fluid consistency: Thin  Diet effective now                            Consultants: Interventional radiologist  Procedures: None    Medications:    amLODipine  5 mg Oral Daily   doxycycline  20 mg Oral BID   DULoxetine  40 mg Oral BID   enoxaparin (LOVENOX) injection  80 mg Subcutaneous Q12H   fluticasone furoate-vilanterol  1 puff Inhalation Daily   And   umeclidinium bromide  1 puff Inhalation Daily   furosemide  40 mg Oral Daily   levothyroxine  125 mcg Oral  Q0600   sodium chloride flush  3 mL Intravenous Q12H   Warfarin - Pharmacist Dosing Inpatient   Does not apply q1600   Continuous Infusions:   Anti-infectives (From admission, onward)    Start     Dose/Rate Route Frequency Ordered Stop   08/06/23 2200  doxycycline (PERIOSTAT) tablet 20 mg        20 mg Oral 2 times daily 08/06/23 1909                Family Communication/Anticipated D/C date and plan/Code Status   DVT prophylaxis:      Code Status: Full Code  Family Communication: Plan discussed with Nancy [name], sister, at the bedside Disposition Plan: Plan to  discharge home   Status is: Inpatient Remains inpatient appropriate because: Abdominal wall hematoma         Subjective:   She complains of nausea and severe left-sided abdominal pain.  No vomiting.  Nancy [name], sister, was at the bedside.  Objective:    Vitals:   08/06/23 1806 08/06/23 1827 08/07/23 0344 08/07/23 0829  BP: 112/72 123/73 132/65 (!) 141/60  Pulse: 88 93 88 92  Resp: 16 16 18 17   Temp: 97.8 F (36.6 C) 98 F (36.7 C) 97.8 F (36.6 C) 98.8 F (37.1 C)  TempSrc:  Oral Oral   SpO2: 100% 91% 93% (!) 89%  Weight:      Height:       No data found.  No intake or output data in the 24 hours ending 08/07/23 1014 Filed Weights   08/06/23 1313  Weight: 84.1 kg    Exam:  GEN: NAD SKIN: No rash. Bruises on abdomen EYES: No pallor or icterus ENT: MMM CV: RRR PULM: CTA B ABD: soft, ND, LLQ tenderness, no rebound tenderness or guarding, +BS CNS: AAO x 3, non focal EXT: No edema or tenderness        Data Reviewed:   I have personally reviewed following labs and imaging studies:  Labs: Labs show the following:   Basic Metabolic Panel: Recent Labs  Lab 08/06/23 1251  NA 134*  K 3.4*  CL 99  CO2 24  GLUCOSE 103*  BUN 15  CREATININE 0.79  CALCIUM 8.8*   GFR Estimated Creatinine Clearance: 84.2 mL/min (by C-G formula based on SCr of 0.79 mg/dL). Liver Function Tests: Recent Labs  Lab 08/06/23 1251  AST 30  ALT 49*  ALKPHOS 113  BILITOT 1.1  PROT 7.4  ALBUMIN 4.0   Recent Labs  Lab 08/06/23 1251  LIPASE 22   No results for input(s): "AMMONIA" in the last 168 hours. Coagulation profile Recent Labs  Lab 08/06/23 1339 08/07/23 0427  INR 1.9* 2.1*    CBC: Recent Labs  Lab 08/06/23 1251 08/06/23 1832 08/07/23 0427  WBC 11.0* 9.8 11.5*  HGB 12.2 11.4* 10.4*  HCT 36.5 34.0* 30.7*  MCV 89.9 90.4 89.5  PLT 277 249 246   Cardiac Enzymes: No results for input(s): "CKTOTAL", "CKMB", "CKMBINDEX", "TROPONINI" in the last 168  hours. BNP (last 3 results) No results for input(s): "PROBNP" in the last 8760 hours. CBG: No results for input(s): "GLUCAP" in the last 168 hours. D-Dimer: No results for input(s): "DDIMER" in the last 72 hours. Hgb A1c: No results for input(s): "HGBA1C" in the last 72 hours. Lipid Profile: No results for input(s): "CHOL", "HDL", "LDLCALC", "TRIG", "CHOLHDL", "LDLDIRECT" in the last 72 hours. Thyroid function studies: No results for input(s): "TSH", "T4TOTAL", "T3FREE", "THYROIDAB" in the last 72 hours.  Invalid input(s): "FREET3" Anemia work up: No results for input(s): "VITAMINB12", "FOLATE", "FERRITIN", "TIBC", "IRON", "RETICCTPCT" in the last 72 hours. Sepsis Labs: Recent Labs  Lab 08/06/23 1251 08/06/23 1832 08/07/23 0427  WBC 11.0* 9.8 11.5*    Microbiology No results found for this or any previous visit (from the past 240 hour(s)).  Procedures and diagnostic studies:  CT ABDOMEN PELVIS W CONTRAST  Result Date: 08/06/2023 CLINICAL DATA:  Abdominal pain in the left lower quadrant. Pain started yesterday is worsening today. EXAM: CT ABDOMEN AND PELVIS WITH CONTRAST TECHNIQUE: Multidetector CT imaging of the abdomen and pelvis was performed using the standard protocol following bolus administration of intravenous contrast. RADIATION DOSE REDUCTION: This exam was performed according to the departmental dose-optimization program which includes automated exposure control, adjustment of the mA and/or kV according to patient size and/or use of iterative reconstruction technique. CONTRAST:  OMNIPAQUE IOHEXOL 300 MG/ML  SOLN COMPARISON:  09/24/2006. [facility] CT from 10/16/2015 is available. FINDINGS: Lower chest: Mild cylindrical bronchiectasis noted in the lung bases with pleuroparenchymal scarring in the lower lungs bilaterally. Hepatobiliary: 16 mm low-density lesion in the left liver is stable compatible with benign etiology such as a cyst. No followup  imaging is recommended. Gallbladder is distended with possible tiny dependent stone towards the neck (23/2). No intrahepatic or extrahepatic biliary dilation. Pancreas: No focal mass lesion. No dilatation of the main duct. No intraparenchymal cyst. No peripancreatic edema. Spleen: No splenomegaly. No suspicious focal mass lesion. Adrenals/Urinary Tract: No adrenal nodule or mass. Marked right-sided hydronephrosis is stable in the interval without evidence for hydroureter, features suggesting chronic UPJ obstruction. Thinning of the overlying cortex is consistent with a chronic process. 3 mm nonobstructing interpolar stone seen in the right kidney on 32/2. 15 mm low-density well-defined homogeneous subcapsular lesion in the lower pole left kidney has attenuation higher than would be expected for a simple cyst. This lesion was present on the 2016 exam measuring 5 mm at that time. Left ureter unremarkable. The urinary bladder appears normal for the degree of distention. Stomach/Bowel: Stomach is unremarkable. No gastric wall thickening. No evidence of outlet obstruction. Duodenum is normally positioned as is the ligament of Treitz. No small bowel wall thickening. No small bowel dilatation. The terminal ileum is normal. The appendix is not well visualized, but there is no edema or inflammation in the region of the cecal tip to suggest appendicitis. No gross colonic mass. No colonic wall thickening. A few scattered diverticuli are seen in the left colon without diverticulitis. Vascular/Lymphatic: Inferior aspect of an aortic stent graft noted in the lower thoracic aorta extending to about the level of the diaphragmatic hiatus. Distal to the stent is a chronic dissection of the abdominal aorta with dissection flap extending into the bifurcation and both common iliac arteries as before. Flap is visible extending into the left external iliac artery as on the prior study. There is no gastrohepatic or hepatoduodenal ligament  lymphadenopathy. No retroperitoneal or mesenteric lymphadenopathy. No pelvic sidewall lymphadenopathy. Reproductive: Hysterectomy.  There is no adnexal mass. Other: No intraperitoneal free fluid. Musculoskeletal: A left rectus sheath hematoma measures on the order of 7.2 x 4.3 x 9.6 cm. Layering high attenuation material within the hematoma is compatible with contrast extravasation consistent with active/ongoing bleeding. There is edema and hemorrhage in the extraperitoneal soft tissues of the anterior left pelvic floor and around the left rectus sheath. No worrisome lytic or sclerotic osseous abnormality. IMPRESSION: 1. 7.2 x 4.3 x 9.6 cm  left rectus sheath hematoma with evidence of active/ongoing bleeding. 2. 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 Imaging appearance not substantially changed since 10/16/2015 exam. 3. 15 mm low-density well-defined homogeneous subcapsular lesion in the lower pole left kidney has attenuation higher than would be expected for a simple cyst. This lesion was present on the 2016 exam measuring 5 mm at that time. Slow progression over such a long time frame this reassuring for benign etiology such as proteinaceous or hemorrhagic cyst. Follow-up outpatient nonemergent MRI abdomen with and without contrast could be used to confirm as clinically warranted. 4. Chronic dissection of the abdominal aorta with dissection flap extending into the bifurcation and both common iliac arteries as before. Flap is visible extending into the left external iliac artery as on the prior study. 5. Mild cylindrical bronchiectasis in the lung bases with pleuroparenchymal scarring in the lower lungs bilaterally. 6.  Aortic Atherosclerosis (ICD10-I70.0). I personally called these results to the emergency department at 1600 hours. Dr. Arnoldo Morale, the ordering physician was no longer on duty and Dr. Roxan Hockey was unavailable due to caring for another  patient. As such, I relayed these findings to the patient's nurse who will communicate them to Dr. Roxan Hockey. Electronically Signed   By: Kennith Center M.D.   On: 08/06/2023 16:01               LOS: 0 days   Yahmir Sokolov  [facility]   Pager on [email or web address]. If 7PM-7AM, please contact night-coverage at [email or web address]     08/07/2023, 10:14 AM

## 2023-08-07 NOTE — Plan of Care (Signed)
Problem: Health Behavior/Discharge Planning: Goal: Ability to manage health-related needs will improve Outcome: Progressing   Problem: Clinical Measurements: Goal: Will remain free from infection Outcome: Progressing   Problem: Clinical Measurements: Goal: Diagnostic test results will improve Outcome: Progressing   Problem: Clinical Measurements: Goal: Respiratory complications will improve Outcome: Progressing   Problem: Clinical Measurements: Goal: Cardiovascular complication will be avoided Outcome: Progressing

## 2023-08-07 NOTE — Progress Notes (Signed)
Referring Physician(s): Willy Eddy, MD   Supervising Physician: Irish Lack  Patient Status:  [facility] - In-pt  Chief Complaint: Abdominal wall bleed.  Subjective: Patient states her LLQ abdominal pain is about the same today no improvement. She states with dilaudid her pain is down to 6/10, she does have some nausea today with movement to the bathroom without vomiting. She denies any lightheadedness or dizziness.   Allergies: Codeine  Medications: Prior to Admission medications   Medication Sig Start Date End Date Taking? Authorizing Provider  ipratropium-albuterol (DUONEB) 0.5-2.5 (3) MG/3ML SOLN Inhale into the lungs. 07/28/23  Yes [provider]  Dwyane Luo 100-62.5-25 MCG/ACT AEPB Inhale into the lungs. 10/08/22  Yes [provider]  warfarin (COUMADIN) 5 MG tablet Take 5 mg by mouth every evening. Per cardiologist: patient supposed to take 7.5 mg 9/24 and 9/25 evening for low INR then resume 5 mg nightly  Goal INR 2.5-3.5 06/01/14  Yes [provider]  albuterol (VENTOLIN HFA) 108 (90 Base) MCG/ACT inhaler SMARTSIG:2 Inhalation Via Inhaler Every 6 Hours PRN 08/14/21   [provider]  amLODipine (NORVASC) 5 MG tablet Take 5 mg by mouth daily. 07/30/11   [provider]  aspirin EC 81 MG tablet Take by mouth. 09/28/22 09/28/23  [provider]  budesonide-formoterol (SYMBICORT) 160-4.5 MCG/ACT inhaler Inhale 2 puffs into the lungs daily.  06/01/14   [provider]  carvedilol (COREG) 25 MG tablet Take 25 mg by mouth 2 (two) times daily. 07/30/11   [provider]  doxycycline (PERIOSTAT) 20 MG tablet TAKE 1 TABLET BY MOUTH TWICE DAILY 03/25/23   Deirdre Evener, MD  DULoxetine (CYMBALTA) 20 MG capsule Take 2 capsules (40 mg total) by mouth 2 (two) times daily. 01/28/23   Jacky Kindle, FNP  enoxaparin (LOVENOX) 100 MG/ML injection Inject into the skin.    [provider]  furosemide  (LASIX) 40 MG tablet Take 40 mg by mouth daily. 07/30/11   [provider]  levothyroxine (SYNTHROID) 125 MCG tablet Take 1 tablet (125 mcg total) by mouth daily before breakfast. 02/23/23   Jacky Kindle, FNP  ondansetron (ZOFRAN ODT) 4 MG disintegrating tablet Take 1 tablet (4 mg total) by mouth every 8 (eight) hours as needed for nausea or vomiting. 10/01/22   Jacky Kindle, FNP  spironolactone (ALDACTONE) 25 MG tablet Take 25 mg by mouth daily.    [provider]  topiramate (TOPAMAX) 50 MG tablet Take 50 mg by mouth 2 (two) times daily. 10/18/21   [provider]  valACYclovir (VALTREX) 1000 MG tablet     [provider]   Vital Signs: BP (!) 141/60 (BP Location: Right Arm)   Pulse 92   Temp 98.8 F (37.1 C)   Resp 17   Ht 5' 7.5" (1.715 m)   Wt 185 lb 6.5 oz (84.1 kg)   SpO2 (!) 89%   BMI 28.61 kg/m   Physical Exam  General: Alert, NAD, sitting in bed Abd: Soft, not firm, dark areas of ecchymosis LLQ TTP- not firm.   Imaging: CT ABDOMEN PELVIS W CONTRAST  Result Date: 08/06/2023 CLINICAL DATA:  Abdominal pain in the left lower quadrant. Pain started yesterday is worsening today. EXAM: CT ABDOMEN AND PELVIS WITH CONTRAST TECHNIQUE: Multidetector CT imaging of the abdomen and pelvis was performed using the standard protocol following bolus administration of intravenous contrast. RADIATION DOSE REDUCTION: This exam was performed according to the departmental dose-optimization program which includes automated  exposure control, adjustment of the mA and/or kV according to patient size and/or use of iterative reconstruction technique. CONTRAST:  OMNIPAQUE IOHEXOL 300 MG/ML  SOLN COMPARISON:  09/24/2006. [facility] CT from 10/16/2015 is available. FINDINGS: Lower chest: Mild cylindrical bronchiectasis noted in the lung bases with pleuroparenchymal scarring in the lower lungs bilaterally. Hepatobiliary: 16 mm low-density lesion in  the left liver is stable compatible with benign etiology such as a cyst. No followup imaging is recommended. Gallbladder is distended with possible tiny dependent stone towards the neck (23/2). No intrahepatic or extrahepatic biliary dilation. Pancreas: No focal mass lesion. No dilatation of the main duct. No intraparenchymal cyst. No peripancreatic edema. Spleen: No splenomegaly. No suspicious focal mass lesion. Adrenals/Urinary Tract: No adrenal nodule or mass. Marked right-sided hydronephrosis is stable in the interval without evidence for hydroureter, features suggesting chronic UPJ obstruction. Thinning of the overlying cortex is consistent with a chronic process. 3 mm nonobstructing interpolar stone seen in the right kidney on 32/2. 15 mm low-density well-defined homogeneous subcapsular lesion in the lower pole left kidney has attenuation higher than would be expected for a simple cyst. This lesion was present on the 2016 exam measuring 5 mm at that time. Left ureter unremarkable. The urinary bladder appears normal for the degree of distention. Stomach/Bowel: Stomach is unremarkable. No gastric wall thickening. No evidence of outlet obstruction. Duodenum is normally positioned as is the ligament of Treitz. No small bowel wall thickening. No small bowel dilatation. The terminal ileum is normal. The appendix is not well visualized, but there is no edema or inflammation in the region of the cecal tip to suggest appendicitis. No gross colonic mass. No colonic wall thickening. A few scattered diverticuli are seen in the left colon without diverticulitis. Vascular/Lymphatic: Inferior aspect of an aortic stent graft noted in the lower thoracic aorta extending to about the level of the diaphragmatic hiatus. Distal to the stent is a chronic dissection of the abdominal aorta with dissection flap extending into the bifurcation and both common iliac arteries as before. Flap is visible extending into the left external  iliac artery as on the prior study. There is no gastrohepatic or hepatoduodenal ligament lymphadenopathy. No retroperitoneal or mesenteric lymphadenopathy. No pelvic sidewall lymphadenopathy. Reproductive: Hysterectomy.  There is no adnexal mass. Other: No intraperitoneal free fluid. Musculoskeletal: A left rectus sheath hematoma measures on the order of 7.2 x 4.3 x 9.6 cm. Layering high attenuation material within the hematoma is compatible with contrast extravasation consistent with active/ongoing bleeding. There is edema and hemorrhage in the extraperitoneal soft tissues of the anterior left pelvic floor and around the left rectus sheath. No worrisome lytic or sclerotic osseous abnormality. IMPRESSION: 1. 7.2 x 4.3 x 9.6 cm left rectus sheath hematoma with evidence of active/ongoing bleeding. 2. 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 Imaging appearance not substantially changed since 10/16/2015 exam. 3. 15 mm low-density well-defined homogeneous subcapsular lesion in the lower pole left kidney has attenuation higher than would be expected for a simple cyst. This lesion was present on the 2016 exam measuring 5 mm at that time. Slow progression over such a long time frame this reassuring for benign etiology such as proteinaceous or hemorrhagic cyst. Follow-up outpatient nonemergent MRI abdomen with and without contrast could be used to confirm as clinically warranted. 4. Chronic dissection of the abdominal aorta with dissection flap extending into the bifurcation and both common iliac arteries as before. Flap is visible  extending into the left external iliac artery as on the prior study. 5. Mild cylindrical bronchiectasis in the lung bases with pleuroparenchymal scarring in the lower lungs bilaterally. 6.  Aortic Atherosclerosis (ICD10-I70.0). I personally called these results to the emergency department at 1600 hours. Dr. Arnoldo Morale, the ordering  physician was no longer on duty and Dr. Roxan Hockey was unavailable due to caring for another patient. As such, I relayed these findings to the patient's nurse who will communicate them to Dr. Roxan Hockey. Electronically Signed   By: Kennith Center M.D.   On: 08/06/2023 16:01    Labs:  CBC: Recent Labs    08/06/23 1251 08/06/23 1832 08/07/23 0427  WBC 11.0* 9.8 11.5*  HGB 12.2 11.4* 10.4*  HCT 36.5 34.0* 30.7*  PLT 277 249 246    COAGS: Recent Labs    08/06/23 1339 08/07/23 0427  INR 1.9* 2.1*    BMP: Recent Labs    08/06/23 1251  NA 134*  K 3.4*  CL 99  CO2 24  GLUCOSE 103*  BUN 15  CALCIUM 8.8*  CREATININE 0.79  GFRNONAA >60    LIVER FUNCTION TESTS: Recent Labs    08/06/23 1251  BILITOT 1.1  AST 30  ALT 49*  ALKPHOS 113  PROT 7.4  ALBUMIN 4.0    Assessment and Plan: 62 year old female with PMHx significant for Type A aortic dissection treated at [facility] with hemi-arch aortic repair with mechanical aortic valve, aortic endograft and recent hospitalization for endo-bronchial valves. She is requiring anticoagulation given her mechanical valve. Patient admitted 9/25 for LLQ abdominal pain 10/10 and today with dilaudid 6/10, CTA revealed spontaneous anterior abdominal wall hematoma, today H/H stable 10.4 (11.4) and VSS, no blood transfusion or vasopressor support needed, pain is not full controlled.   Discussed case with attending today Dr. Fredia Sorrow and no need for intervention at this time as patient is clinically stable and abdominal wall bleeds will often stop spontaneously, angiogram with embolization would be challenging given aortic dissection anatomy and low yield of localizing the bleed. If the patient's hgb drops and she is requiring blood transfusion would recommend ordering CTA abdomen/pelvis GI bleed protocol.   We also recommend abdominal wall binder and limited OOB activity at this time for bathroom only.   Electronically Signed: Berneta Levins,  PA-C 08/07/2023, 10:10 AM   I spent a total of 15 Minutes at the the patient's bedside AND on the patient's hospital floor or unit, greater than 50% of which was counseling/coordinating care for abdominal wall bleed.

## 2023-08-07 NOTE — Consult Note (Addendum)
ANTICOAGULATION CONSULT NOTE  Pharmacy Consult for Warfarin  Indication: Mechanical Aortic Valve  Patient Measurements: Height: 5' 7.5" (171.5 cm) Weight: 84.1 kg (185 lb 6.5 oz) IBW/kg (Calculated) : 62.75  Vital Signs: Temp: 97.8 F (36.6 C) (09/26 0344) Temp Source: Oral (09/26 0344) BP: 132/65 (09/26 0344) Pulse Rate: 88 (09/26 0344)  Labs: Recent Labs    08/06/23 1251 08/06/23 1339 08/06/23 1832 08/07/23 0427  HGB 12.2  --  11.4* 10.4*  HCT 36.5  --  34.0* 30.7*  PLT 277  --  249 246  LABPROT  --  22.4*  --  23.5*  INR  --  1.9*  --  2.1*  CREATININE 0.79  --   --   --    Estimated Creatinine Clearance: 84.2 mL/min (by C-G formula based on SCr of 0.79 mg/dL).  Interacting Medications:  PTA Meds - Lovenox, levothyroxine, doxycycline  Assessment: Quintavia Paleo is a 62 year old female presenting with an abdominal wall hematoma with evidence of active bleeding. IR was consulted and plan is for conservative management at this time. Patient has history of bicuspid aortic valve plus an aortic dissection for which she underwent AVR and is currently on warfarin with a goal INR of 2.5-3.5. Per fill history and outpatient records, home regimen is 5 mg daily. Patient took higher dose of 7.5 mg yesterday due to subtherapeutic INR. After recent endobronchial stent placement, warfarin was restarted with a Lovenox bridge   DDIs: duloxetine, levothyroxine, doxycycline (all are chronic medications)  Goal of Therapy:  INR 2.0 - 3.0 Monitor platelets by anticoagulation protocol: Yes   Plan:  INR therapeutic at 2.1 today (goal changed 9/26) Give warfarin 7.5 mg tonight given INR at lower end of range stop Lovenox bridge per MD Monitor INR daily Continue to monitor H/H and platelets   Burnis Medin, PharmD, BCPS 08/07/2023,7:07 AM

## 2023-08-07 NOTE — TOC CM/SW Note (Signed)
Transition of Care [facility]) - Inpatient Brief Assessment   Patient Details  Name: Nancy [name] MRN: [id number] Date of Birth: [birth year]  Transition of Care [facility]) CM/SW Contact:    Chapman Fitch, RN Phone Number: 08/07/2023, 9:01 AM   Clinical Narrative:  Transition of Care (TOC) Screening Note   Patient Details  Name: Nancy [name] Date of Birth: January 19, 1961   Transition of Care [facility]) CM/SW Contact:    Chapman Fitch, RN Phone Number: 08/07/2023, 9:01 AM    Transition of Care [facility]) has reviewed patient and no TOC needs have been identified at this time. We will continue to monitor patient advancement through interdisciplinary progression rounds. If new patient transition needs arise, please place a TOC consult.     Transition of Care Asessment: Insurance and Status: Insurance coverage has been reviewed Patient has primary care physician: Yes     Prior/Current Home Services: No current home services Social Determinants of Health Reivew: SDOH reviewed no interventions necessary Readmission risk has been reviewed: Yes Transition of care needs: no transition of care needs at this time

## 2023-08-08 ENCOUNTER — Ambulatory Visit: Payer: 59 | Admitting: Family Medicine

## 2023-08-08 DIAGNOSIS — S301XXA Contusion of abdominal wall, initial encounter: Secondary | ICD-10-CM | POA: Diagnosis not present

## 2023-08-08 LAB — CBC
HCT: 29 % — ABNORMAL LOW (ref 36.0–46.0)
Hemoglobin: 9.7 g/dL — ABNORMAL LOW (ref 12.0–15.0)
MCH: 30.6 pg (ref 26.0–34.0)
MCHC: 33.4 g/dL (ref 30.0–36.0)
MCV: 91.5 fL (ref 80.0–100.0)
Platelets: 263 10*3/uL (ref 150–400)
RBC: 3.17 MIL/uL — ABNORMAL LOW (ref 3.87–5.11)
RDW: 13.9 % (ref 11.5–15.5)
WBC: 17.3 10*3/uL — ABNORMAL HIGH (ref 4.0–10.5)
nRBC: 0 % (ref 0.0–0.2)

## 2023-08-08 LAB — PROTIME-INR
INR: 3.3 — ABNORMAL HIGH (ref 0.8–1.2)
Prothrombin Time: 33.6 s — ABNORMAL HIGH (ref 11.4–15.2)

## 2023-08-08 MED ORDER — SODIUM CHLORIDE 0.9 % IV BOLUS
500.0000 mL | Freq: Once | INTRAVENOUS | Status: AC
  Administered 2023-08-08: 500 mL via INTRAVENOUS

## 2023-08-08 MED ORDER — WARFARIN SODIUM 2.5 MG PO TABS
2.5000 mg | ORAL_TABLET | Freq: Once | ORAL | Status: DC
  Filled 2023-08-08: qty 1

## 2023-08-08 MED ORDER — IPRATROPIUM-ALBUTEROL 0.5-2.5 (3) MG/3ML IN SOLN
3.0000 mL | Freq: Three times a day (TID) | RESPIRATORY_TRACT | Status: DC
  Administered 2023-08-08 – 2023-08-12 (×11): 3 mL via RESPIRATORY_TRACT
  Filled 2023-08-08 (×11): qty 3

## 2023-08-08 MED ORDER — WARFARIN SODIUM 1 MG PO TABS
1.0000 mg | ORAL_TABLET | Freq: Once | ORAL | Status: AC
  Administered 2023-08-08: 1 mg via ORAL
  Filled 2023-08-08: qty 1

## 2023-08-08 MED ORDER — HYDROMORPHONE HCL 1 MG/ML IJ SOLN
0.5000 mg | INTRAMUSCULAR | Status: DC | PRN
  Administered 2023-08-08 – 2023-08-09 (×3): 0.5 mg via INTRAVENOUS
  Filled 2023-08-08 (×3): qty 0.5

## 2023-08-08 MED ORDER — HYDROMORPHONE HCL 1 MG/ML IJ SOLN: 1.0000 mg | INTRAMUSCULAR | Status: DC | PRN

## 2023-08-08 MED ORDER — SODIUM CHLORIDE 0.9 % IV SOLN: INTRAVENOUS | Status: DC

## 2023-08-08 NOTE — Consult Note (Signed)
ANTICOAGULATION CONSULT NOTE  Pharmacy Consult for Warfarin  Indication: Mechanical Aortic Valve  Patient Measurements: Height: 5' 7.5" (171.5 cm) Weight: 84.1 kg (185 lb 6.5 oz) IBW/kg (Calculated) : 62.75  Vital Signs: Temp: 98.2 F (36.8 C) (09/27 0756) Temp Source: Oral (09/27 0756) BP: 133/63 (09/27 0756) Pulse Rate: 98 (09/27 0756)  Labs: Recent Labs    08/06/23 1251 08/06/23 1339 08/06/23 1832 08/07/23 0427 08/08/23 0507  HGB 12.2  --  11.4* 10.4* 9.7*  HCT 36.5  --  34.0* 30.7* 29.0*  PLT 277  --  249 246 263  LABPROT  --  22.4*  --  23.5* 33.6*  INR  --  1.9*  --  2.1* 3.3*  CREATININE 0.79  --   --   --   --    Estimated Creatinine Clearance: 84.2 mL/min (by C-G formula based on SCr of 0.79 mg/dL).  Interacting Medications:  PTA Meds - Lovenox, levothyroxine, doxycycline  Assessment: Nancy [name] is a 62 year old female presenting with an abdominal wall hematoma with evidence of active bleeding. IR was consulted and plan is for conservative management at this time. Patient has history of bicuspid aortic valve plus an aortic dissection for which she underwent AVR and is currently on warfarin with an original goal INR of 2.5-3.5. Per fill history and outpatient records, home regimen is 5 mg daily. Patient took higher dose of 7.5 mg yesterday due to subtherapeutic INR. After recent endobronchial stent placement, warfarin was restarted with a Lovenox bridge   DDIs: duloxetine, levothyroxine, doxycycline (all are chronic medications)  Goal of Therapy:  INR 2.0 - 3.0 Monitor platelets by anticoagulation protocol: Yes   Plan:  INR supratherapeutic at 3.3 today (goal changed 9/26) Give warfarin 1 mg tonight (less than home but not held to keep in therapeutic range) Monitor INR daily Continue to monitor H/H and platelets   Burnis Medin, PharmD, BCPS 08/08/2023,8:19 AM

## 2023-08-08 NOTE — Plan of Care (Signed)

## 2023-08-08 NOTE — Progress Notes (Addendum)
Progress Note    Nancy [name]  ZOX:[id number] DOB: 1961/10/10  DOA: 08/06/2023 PCP: Jacky Kindle, FNP      Brief Narrative:    Medical records reviewed and are as summarized below:  Nancy [name] is a 62 y.o. 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  She was discharged on Lovenox bridge 90 mg every 12 hours for 7 days along with warfarin 5 mg daily with goal INR of 2-3 as recommended by her CT surgeon.  She presented to the hospital with abdominal pain mainly in the left lower quadrant.  Abdominal pain started a day prior to admission.  She had been taking her Lovenox injection as prescribed.   She was found to have abdominal wall hematoma.      Assessment/Plan:   Principal Problem:   Abdominal wall hematoma Active Problems:   H/O mechanical aortic valve replacement   Adult hypothyroidism   Essential (primary) hypertension   Chronic obstructive pulmonary disease (HCC)   OSA (obstructive sleep apnea)    Body mass index is 28.61 kg/m.   Abdominal wall hematoma measuring 7.2 x 5.3 x 9.6 cm, abdominal pain.: She was evaluated by interventional radiologist.  No plan for surgical intervention at this time.   Continue analgesics as needed for pain.   Dizziness on standing/ambulating: Give IV normal saline bolus followed by IV normal saline maintenance infusion because of dizziness on standing.  Orthostatic vital signs were done but no  orthostatic hypotension at this time.   Analgesics as needed.  Increase IV Dilaudid from 1 mg to 1.5 mg every 3 hours for severe abdominal pain.  Oxycodone as needed for pain.  Zofran as needed for nausea.   Acute blood loss anemia: Hemoglobin dropped from 12.2-9.7.  No indication for blood transfusion at this time.  Monitor H&H. IV fluids for hydration.   S/p Saint Jude mechanical aortic valve replacement: INR 3.3.  Continue warfarin.  Monitor INR and adjust warfarin dose per pharmacy protocol.   Goal INR is 2-3.   Hypertension: Amlodipine was held today.  Continue metoprolol.   OSA: Continue 2 L/min oxygen nightly   COPD: Stable.  Continue bronchodilators   Other comorbidities include hypothyroidism, s/p bronchoscopic lung volume reduction surgery with endobronchial valves to RML/RUL on 07/24/2023   ADDENDUM   I was informed by Danelle Earthly, RN, that patient was drowsy and had decreased oxygen saturation down to 70% on room air.  It was suspected that this was due to IV Dilaudid.  I went back to the bedside to assess the patient.  When I arrived, patient was more alert and communicative.  She was oriented.  Pain management discussed with the patient.  She has been told that IV Dilaudid will be decreased to reduce the risk of adverse effects. She was on 4 L/min oxygen when I arrived. Oxygen saturation on room air was up to 95%.  Assessment: Acute on chronic hypoxemic respiratory failure acute toxic encephalopathy from opioids. Pain management discussed with the patient.  She has been told that IV  Dilaudid will be decreased to reduce the risk of adverse effects. No indication for Narcan at this time. Continue to monitor patient closely.    Diet Order             Diet regular Room service appropriate? Yes; Fluid consistency: Thin  Diet effective now                            Consultants: Interventional radiologist  Procedures: None    Medications:     amLODipine  5 mg Oral Daily   doxycycline  20 mg Oral BID   DULoxetine  40 mg Oral QHS   DULoxetine  60 mg Oral Daily   Fluticasone-Umeclidin-Vilant  1 Inhalation Inhalation Daily   furosemide  40 mg Oral Daily   levothyroxine  125 mcg Oral Q0600   metoprolol succinate  25 mg Oral Daily   sodium chloride flush  3 mL Intravenous Q12H   Warfarin - Pharmacist Dosing Inpatient   Does not apply q1600   Continuous Infusions:  sodium chloride       Anti-infectives (From admission, onward)    Start     Dose/Rate Route Frequency Ordered Stop   08/06/23 2200  doxycycline (PERIOSTAT) tablet 20 mg        20 mg Oral 2 times daily 08/06/23 1909                Family Communication/Anticipated D/C date and plan/Code Status   DVT prophylaxis:      Code Status: Full Code  Family Communication: None Disposition Plan: Plan to discharge home   Status is: Inpatient Remains inpatient appropriate because: Abdominal wall hematoma         Subjective:   Interval events noted.  C/o dizziness on standing/walking and pain in the left lower abdomen. Noelle, RN, at bedside  Objective:    Vitals:   08/07/23 1900 08/08/23 0316 08/08/23 0756 08/08/23 0807  BP: 132/72 (!) 124/56 133/63   Pulse: 95 (!) 103 98   Resp: 18 18 18 18   Temp: 98.8 F (37.1 C)  98.2 F (36.8 C)   TempSrc: Oral  Oral   SpO2: 95% 95% (!) 84% 95%  Weight:      Height:       No data found.   Intake/Output Summary (Last 24 hours) at 08/08/2023 0936 Last data filed at 08/07/2023 1000 Gross per 24 hour  Intake 240 ml  Output --  Net 240 ml   Filed Weights   08/06/23 1313  Weight: 84.1 kg    Exam:  GEN: NAD SKIN: Warm and dry EYES: No pallor or icterus ENT: MMM CV: RRR PULM: CTA B ABD: soft, ND, LLQ tenderness, +BS CNS: AAO x 3, non focal EXT: No edema or tenderness      Data Reviewed:   I have personally reviewed following labs and imaging studies:  Labs: Labs show the following:    Basic Metabolic Panel: Recent Labs  Lab 08/06/23 1251  NA 134*  K 3.4*  CL 99  CO2 24  GLUCOSE 103*  BUN 15  CREATININE 0.79  CALCIUM 8.8*   GFR Estimated Creatinine Clearance: 84.2 mL/min (by C-G formula based on SCr of 0.79 mg/dL). Liver Function Tests: Recent Labs  Lab 08/06/23 1251  AST 30  ALT 49*  ALKPHOS 113  BILITOT 1.1  PROT 7.4  ALBUMIN 4.0   Recent Labs  Lab 08/06/23 1251  LIPASE 22  No results for input(s): "AMMONIA" in the last 168 hours. Coagulation profile Recent Labs  Lab 08/06/23 1339 08/07/23 0427 08/08/23 0507  INR 1.9* 2.1* 3.3*    CBC: Recent Labs  Lab 08/06/23 1251 08/06/23 1832 08/07/23 0427 08/08/23 0507  WBC 11.0* 9.8 11.5* 17.3*  HGB 12.2 11.4* 10.4* 9.7*  HCT 36.5 34.0* 30.7* 29.0*  MCV 89.9 90.4 89.5 91.5  PLT 277 249 246 263   Cardiac Enzymes: No results for input(s): "CKTOTAL", "CKMB", "CKMBINDEX", "TROPONINI" in the last 168 hours. BNP (last 3 results) No results for input(s): "PROBNP" in the last 8760 hours. CBG: No results for input(s): "GLUCAP" in the last 168 hours. D-Dimer: No results for input(s): "DDIMER" in the last 72 hours. Hgb A1c: No results for input(s): "HGBA1C" in the last 72 hours. Lipid Profile: No results for input(s): "CHOL", "HDL", "LDLCALC", "TRIG", "CHOLHDL", "LDLDIRECT" in the last 72 hours. Thyroid function studies: No results for input(s): "TSH", "T4TOTAL", "T3FREE", "THYROIDAB" in the last 72 hours.  Invalid input(s): "FREET3" Anemia work up: No results for input(s): "VITAMINB12", "FOLATE", "FERRITIN", "TIBC", "IRON", "RETICCTPCT" in the last 72 hours. Sepsis Labs: Recent Labs  Lab 08/06/23 1251 08/06/23 1832 08/07/23 0427 08/08/23 0507  WBC 11.0* 9.8 11.5* 17.3*    Microbiology No results found for this or any previous visit (from the past 240 hour(s)).  Procedures and diagnostic studies:  CT ABDOMEN PELVIS W CONTRAST  Result Date: 08/06/2023 CLINICAL DATA:   Abdominal pain in the left lower quadrant. Pain started yesterday is worsening today. EXAM: CT ABDOMEN AND PELVIS WITH CONTRAST TECHNIQUE: Multidetector CT imaging of the abdomen and pelvis was performed using the standard protocol following bolus administration of intravenous contrast. RADIATION DOSE REDUCTION: This exam was performed according to the departmental dose-optimization program which includes automated exposure control, adjustment of the mA and/or kV according to patient size and/or use of iterative reconstruction technique. CONTRAST:  OMNIPAQUE IOHEXOL 300 MG/ML  SOLN COMPARISON:  09/24/2006. [facility] CT from 10/16/2015 is available. FINDINGS: Lower chest: Mild cylindrical bronchiectasis noted in the lung bases with pleuroparenchymal scarring in the lower lungs bilaterally. Hepatobiliary: 16 mm low-density lesion in the left liver is stable compatible with benign etiology such as a cyst. No followup imaging is recommended. Gallbladder is distended with possible tiny dependent stone towards the neck (23/2). No intrahepatic or extrahepatic biliary dilation. Pancreas: No focal mass lesion. No dilatation of the main duct. No intraparenchymal cyst. No peripancreatic edema. Spleen: No splenomegaly. No suspicious focal mass lesion. Adrenals/Urinary Tract: No adrenal nodule or mass. Marked right-sided hydronephrosis is stable in the interval without evidence for hydroureter, features suggesting chronic UPJ obstruction. Thinning of the overlying cortex is consistent with a chronic process. 3 mm nonobstructing interpolar stone seen in the right kidney on 32/2. 15 mm low-density well-defined homogeneous subcapsular lesion in the lower pole left kidney has attenuation higher than would be expected for a simple cyst. This lesion was present on the 2016 exam measuring 5 mm at that time. Left ureter unremarkable. The urinary bladder appears normal for the degree of distention.  Stomach/Bowel: Stomach is unremarkable. No gastric wall thickening. No evidence of outlet obstruction. Duodenum is normally positioned as is the ligament of Treitz. No small bowel wall thickening. No small bowel dilatation. The terminal ileum is normal. The appendix is not well visualized, but there is no edema or inflammation in the region of the cecal tip to suggest appendicitis. No gross colonic mass. No colonic wall thickening. A  few scattered diverticuli are seen in the left colon without diverticulitis. Vascular/Lymphatic: Inferior aspect of an aortic stent graft noted in the lower thoracic aorta extending to about the level of the diaphragmatic hiatus. Distal to the stent is a chronic dissection of the abdominal aorta with dissection flap extending into the bifurcation and both common iliac arteries as before. Flap is visible extending into the left external iliac artery as on the prior study. There is no gastrohepatic or hepatoduodenal ligament lymphadenopathy. No retroperitoneal or mesenteric lymphadenopathy. No pelvic sidewall lymphadenopathy. Reproductive: Hysterectomy.  There is no adnexal mass. Other: No intraperitoneal free fluid. Musculoskeletal: A left rectus sheath hematoma measures on the order of 7.2 x 4.3 x 9.6 cm. Layering high attenuation material within the hematoma is compatible with contrast extravasation consistent with active/ongoing bleeding. There is edema and hemorrhage in the extraperitoneal soft tissues of the anterior left pelvic floor and around the left rectus sheath. No worrisome lytic or sclerotic osseous abnormality. IMPRESSION: 1. 7.2 x 4.3 x 9.6 cm left rectus sheath hematoma with evidence of active/ongoing bleeding. 2. 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 Imaging appearance not substantially changed since 10/16/2015 exam. 3. 15 mm low-density well-defined homogeneous subcapsular lesion in the lower pole  left kidney has attenuation higher than would be expected for a simple cyst. This lesion was present on the 2016 exam measuring 5 mm at that time. Slow progression over such a long time frame this reassuring for benign etiology such as proteinaceous or hemorrhagic cyst. Follow-up outpatient nonemergent MRI abdomen with and without contrast could be used to confirm as clinically warranted. 4. Chronic dissection of the abdominal aorta with dissection flap extending into the bifurcation and both common iliac arteries as before. Flap is visible extending into the left external iliac artery as on the prior study. 5. Mild cylindrical bronchiectasis in the lung bases with pleuroparenchymal scarring in the lower lungs bilaterally. 6.  Aortic Atherosclerosis (ICD10-I70.0). I personally called these results to the emergency department at 1600 hours. Dr. Arnoldo Morale, the ordering physician was no longer on duty and Dr. Roxan Hockey was unavailable due to caring for another patient. As such, I relayed these findings to the patient's nurse who will communicate them to Dr. Roxan Hockey. Electronically Signed   By: Kennith Center M.D.   On: 08/06/2023 16:01               LOS: 1 day   Ellice Boultinghouse  [facility]   Pager on [email or web address]. If 7PM-7AM, please contact night-coverage at [email or web address]     08/08/2023, 9:36 AM

## 2023-08-08 NOTE — Plan of Care (Signed)

## 2023-08-09 ENCOUNTER — Inpatient Hospital Stay: Payer: 59

## 2023-08-09 DIAGNOSIS — D62 Acute posthemorrhagic anemia: Secondary | ICD-10-CM | POA: Insufficient documentation

## 2023-08-09 DIAGNOSIS — S301XXA Contusion of abdominal wall, initial encounter: Secondary | ICD-10-CM | POA: Diagnosis not present

## 2023-08-09 DIAGNOSIS — G929 Unspecified toxic encephalopathy: Secondary | ICD-10-CM | POA: Insufficient documentation

## 2023-08-09 DIAGNOSIS — J9601 Acute respiratory failure with hypoxia: Secondary | ICD-10-CM | POA: Insufficient documentation

## 2023-08-09 LAB — ABO/RH: ABO/RH(D): O NEG

## 2023-08-09 LAB — CBC WITH DIFFERENTIAL/PLATELET
Abs Immature Granulocytes: 0.05 10*3/uL (ref 0.00–0.07)
Basophils Absolute: 0 10*3/uL (ref 0.0–0.1)
Basophils Relative: 0 %
Eosinophils Absolute: 0.1 10*3/uL (ref 0.0–0.5)
Eosinophils Relative: 1 %
HCT: 24 % — ABNORMAL LOW (ref 36.0–46.0)
Hemoglobin: 8.1 g/dL — ABNORMAL LOW (ref 12.0–15.0)
Immature Granulocytes: 1 %
Lymphocytes Relative: 6 %
Lymphs Abs: 0.7 10*3/uL (ref 0.7–4.0)
MCH: 30.8 pg (ref 26.0–34.0)
MCHC: 33.8 g/dL (ref 30.0–36.0)
MCV: 91.3 fL (ref 80.0–100.0)
Monocytes Absolute: 1 10*3/uL (ref 0.1–1.0)
Monocytes Relative: 9 %
Neutro Abs: 8.7 10*3/uL — ABNORMAL HIGH (ref 1.7–7.7)
Neutrophils Relative %: 83 %
Platelets: 211 10*3/uL (ref 150–400)
RBC: 2.63 MIL/uL — ABNORMAL LOW (ref 3.87–5.11)
RDW: 14.2 % (ref 11.5–15.5)
WBC: 10.6 10*3/uL — ABNORMAL HIGH (ref 4.0–10.5)
nRBC: 0 % (ref 0.0–0.2)

## 2023-08-09 LAB — BASIC METABOLIC PANEL
Anion gap: 8 (ref 5–15)
BUN: 11 mg/dL (ref 6–20)
CO2: 26 mmol/L (ref 22–32)
Calcium: 8 mg/dL — ABNORMAL LOW (ref 8.9–10.3)
Chloride: 102 mmol/L (ref 98–111)
Creatinine, Ser: 0.7 mg/dL (ref 0.44–1.00)
GFR, Estimated: >60 mL/min (ref >60)
Glucose, Bld: 108 mg/dL — ABNORMAL HIGH (ref 70–99)
Potassium: 3.5 mmol/L (ref 3.5–5.1)
Sodium: 136 mmol/L (ref 135–145)

## 2023-08-09 LAB — PROTIME-INR
INR: 4.7 (ref 0.8–1.2)
Prothrombin Time: 44.3 s — ABNORMAL HIGH (ref 11.4–15.2)

## 2023-08-09 LAB — MAGNESIUM: Magnesium: 2 mg/dL (ref 1.7–2.4)

## 2023-08-09 LAB — PREPARE RBC (CROSSMATCH)

## 2023-08-09 MED ORDER — SODIUM CHLORIDE 0.9% IV SOLUTION: Freq: Once | INTRAVENOUS | Status: AC

## 2023-08-09 MED ORDER — IOHEXOL 9 MG/ML PO SOLN
500.0000 mL | ORAL | Status: AC
  Administered 2023-08-09 (×2): 500 mL

## 2023-08-09 MED ORDER — PHYTONADIONE 5 MG PO TABS
2.5000 mg | ORAL_TABLET | Freq: Once | ORAL | Status: AC
  Administered 2023-08-09: 2.5 mg via ORAL
  Filled 2023-08-09: qty 1

## 2023-08-09 MED ORDER — HYDROMORPHONE HCL 1 MG/ML IJ SOLN
0.5000 mg | Freq: Four times a day (QID) | INTRAMUSCULAR | Status: DC | PRN
  Administered 2023-08-09 – 2023-08-10 (×3): 0.5 mg via INTRAVENOUS
  Filled 2023-08-09 (×3): qty 0.5

## 2023-08-09 NOTE — Progress Notes (Addendum)
Progress Note    Nancy [name]  MWN:[id number] DOB: 1961/09/04  DOA: 08/06/2023 PCP: Jacky Kindle, FNP      Brief Narrative:    Medical records reviewed and are as summarized below:  Nancy [name] is a 62 y.o. 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  She was discharged on Lovenox bridge 90 mg every 12 hours for 7 days along with warfarin 5 mg daily with goal INR of 2-3 as recommended by her CT surgeon.  She presented to the hospital with abdominal pain mainly in the left lower quadrant.  Abdominal pain started a day prior to admission.  She had been taking her Lovenox injection as prescribed.   She was found to have abdominal wall hematoma.      Assessment/Plan:   Principal Problem:   Abdominal wall hematoma Active Problems:   H/O mechanical aortic valve replacement   Adult hypothyroidism   Essential (primary) hypertension   Chronic obstructive pulmonary disease (HCC)   OSA (obstructive sleep apnea)    Body mass index is 28.61 kg/m.   Abdominal wall hematoma measuring 7.2 x 5.3 x 9.6 cm, abdominal pain: She was evaluated by interventional radiologist.  No plan for surgical intervention at this time.   Follow-up abdominal wall hematoma with repeat CT abdomen and pelvis without contrast. Continue analgesics as needed for pain.   Acute blood loss anemia: Hemoglobin dropped from 12.2-8.1. Transfuse 1 unit of packed red blood cells.   Discussed risks and benefits of blood transfusion.  Patient is agreeable to blood transfusion.  She had discussed this with Nancy [name], her sister, at the bedside and Nancy [name] is also okay with blood transfusion.   Dizziness on standing/ambulating: Improved after IV fluids. There was no evidence of orthostatic hypotension.   Acute hypoxemic respiratory failure and acute toxic encephalopathy: Resolved.  This was likely from frequent use of IV Dilaudid.   S/p Saint Jude mechanical aortic valve replacement, on Coumadin coagulopathy: INR is supratherapeutic at 4.7.   Patient had been given vitamin K 2.5 mg x 1 dose earlier this morning.  Hold warfarin for now.  Monitor INR closely. Goal INR is 2-3.   Acute hypoxemic respiratory failure and acute toxic encephalopathy: Resolved.  This was likely from frequent use of IV Dilaudid.   Hypertension: Continue antihypertensives   OSA: Continue 2 L/min oxygen nightly and oxygen as needed.   COPD: Stable.  Continue bronchodilators   Other comorbidities include hypothyroidism, s/p bronchoscopic lung volume reduction surgery with endobronchial valves to RML/RUL on 07/24/2023     Diet Order             Diet regular Room service appropriate? Yes; Fluid consistency: Thin  Diet effective now                            Consultants: Interventional radiologist  Procedures: None    Medications:    sodium chloride   Intravenous Once   amLODipine  5 mg Oral Daily   doxycycline  20 mg Oral BID   DULoxetine  40 mg Oral QHS   DULoxetine  60 mg Oral Daily   Fluticasone-Umeclidin-Vilant  1 Inhalation Inhalation Daily   furosemide  40 mg Oral Daily   ipratropium-albuterol  3 mL Nebulization TID   levothyroxine  125 mcg Oral Q0600   metoprolol succinate  25 mg Oral Daily   sodium chloride flush  3 mL Intravenous Q12H   Warfarin - Pharmacist Dosing Inpatient   Does not apply q1600   Continuous Infusions:     Anti-infectives (From  admission, onward)    Start     Dose/Rate Route Frequency Ordered Stop   08/06/23 2200  doxycycline (PERIOSTAT) tablet 20 mg        20 mg Oral 2 times daily 08/06/23 1909                Family Communication/Anticipated D/C date and plan/Code Status   DVT prophylaxis:      Code Status: Full Code  Family Communication: None Disposition Plan: Plan to discharge home   Status is: Inpatient Remains inpatient appropriate because: Abdominal wall hematoma         Subjective:   Interval events noted.  She still complains of left lower abdominal pain.  No dizziness, chest pain or shortness of breath.  Nancy [name], sister, was at the bedside  Objective:    Vitals:   08/08/23 2107 08/09/23 0411 08/09/23 0821 08/09/23 0834  BP:  124/60 105/64   Pulse:  94 63 (!) 104  Resp:  16 20   Temp:  98.3 F (36.8 C) 98.2 F (36.8 C)   TempSrc:  Oral Oral   SpO2: 96% 98%  99%  Weight:      Height:       No data found.   Intake/Output Summary (Last 24 hours) at 08/09/2023 1259 Last data filed at 08/09/2023 0845 Gross per 24 hour  Intake 1012.72 ml  Output --  Net 1012.72 ml   Filed Weights   08/06/23 1313  Weight: 84.1 kg    Exam:  GEN: NAD SKIN: Warm and dry EYES: EOMI ENT: MMM CV: RRR PULM: Bilateral expiratory wheezing ABD: soft, ND, LLQ tenderness, +BS CNS: AAO x 3, non focal EXT: No edema or tenderness     Data Reviewed:   I have personally reviewed following labs and imaging studies:  Labs: Labs show the following:   Basic Metabolic Panel: Recent Labs  Lab 08/06/23 1251 08/09/23 0500  NA 134* 136  K 3.4* 3.5  CL 99 102  CO2 24 26  GLUCOSE 103* 108*  BUN 15 11  CREATININE 0.79 0.70  CALCIUM 8.8* 8.0*  MG  --  2.0   GFR Estimated Creatinine Clearance: 84.2 mL/min (by C-G formula based on SCr of 0.7 mg/dL). Liver Function Tests: Recent Labs  Lab 08/06/23 1251  AST 30  ALT 49*  ALKPHOS 113  BILITOT 1.1  PROT 7.4  ALBUMIN 4.0    Recent Labs  Lab 08/06/23 1251  LIPASE 22   No results for input(s): "AMMONIA" in the last 168 hours. Coagulation profile Recent Labs  Lab 08/06/23 1339 08/07/23 0427 08/08/23 0507 08/09/23 0500  INR 1.9* 2.1* 3.3* 4.7*    CBC: Recent Labs  Lab 08/06/23 1251 08/06/23 1832 08/07/23 0427 08/08/23 0507 08/09/23 0500  WBC 11.0* 9.8 11.5* 17.3* 10.6*  NEUTROABS  --   --   --   --  8.7*  HGB 12.2 11.4* 10.4* 9.7* 8.1*  HCT 36.5  34.0* 30.7* 29.0* 24.0*  MCV 89.9 90.4 89.5 91.5 91.3  PLT 277 249 246 263 211   Cardiac Enzymes: No results for input(s): "CKTOTAL", "CKMB", "CKMBINDEX", "TROPONINI" in the last 168 hours. BNP (last 3 results) No results for input(s): "PROBNP" in the last 8760 hours. CBG: No results for input(s): "GLUCAP" in the last 168 hours. D-Dimer: No results for input(s): "DDIMER" in the last 72 hours. Hgb A1c: No results for input(s): "HGBA1C" in the last 72 hours. Lipid Profile: No results for input(s): "CHOL", "HDL", "LDLCALC", "TRIG", "CHOLHDL", "LDLDIRECT" in the last 72 hours. Thyroid function studies: No results for input(s): "TSH", "T4TOTAL", "T3FREE", "THYROIDAB" in the last 72 hours.  Invalid input(s): "FREET3" Anemia work up: No results for input(s): "VITAMINB12", "FOLATE", "FERRITIN", "TIBC", "IRON", "RETICCTPCT" in the last 72 hours. Sepsis Labs: Recent Labs  Lab 08/06/23 1832 08/07/23 0427 08/08/23 0507 08/09/23 0500  WBC 9.8 11.5* 17.3* 10.6*    Microbiology No results found for this or any previous visit (from the past 240 hour(s)).  Procedures and diagnostic studies:  No results found.             LOS: 2 days   Micha Erck  [facility]   Pager on [email or web address]. If 7PM-7AM, please contact night-coverage at [email or web address]     08/09/2023, 12:59 PM

## 2023-08-09 NOTE — Progress Notes (Signed)
Received critical lab result: INR 4.7. Notified cross-cover NP in person.

## 2023-08-09 NOTE — Consult Note (Signed)
ANTICOAGULATION CONSULT NOTE  Pharmacy Consult for Warfarin  Indication: Mechanical Aortic Valve  Patient Measurements: Height: 5' 7.5" (171.5 cm) Weight: 84.1 kg (185 lb 6.5 oz) IBW/kg (Calculated) : 62.75  Vital Signs: Temp: 98.3 F (36.8 C) (09/28 0411) Temp Source: Oral (09/28 0411) BP: 124/60 (09/28 0411) Pulse Rate: 94 (09/28 0411)  Labs: Recent Labs    08/06/23 1251 08/06/23 1339 08/07/23 0427 08/08/23 0507 08/09/23 0500  HGB 12.2   < > 10.4* 9.7* 8.1*  HCT 36.5   < > 30.7* 29.0* 24.0*  PLT 277   < > 246 263 211  LABPROT  --    < > 23.5* 33.6* 44.3*  INR  --    < > 2.1* 3.3* 4.7*  CREATININE 0.79  --   --   --  0.70   < > = values in this interval not displayed.   Estimated Creatinine Clearance: 84.2 mL/min (by C-G formula based on SCr of 0.7 mg/dL).  Interacting Medications:  PTA Meds - Lovenox, levothyroxine, doxycycline  Assessment: Nancy [name] is a 62 year old female presenting with an abdominal wall hematoma with evidence of active bleeding. IR was consulted and plan is for conservative management at this time. Patient has history of bicuspid aortic valve plus an aortic dissection for which she underwent AVR and is currently on warfarin with an original goal INR of 2.5-3.5. Per fill history and outpatient records, home regimen is 5 mg daily. Patient took higher dose of 7.5 mg yesterday due to subtherapeutic INR. After recent endobronchial stent placement, warfarin was restarted with a Lovenox bridge   DDIs: duloxetine, levothyroxine, doxycycline (all are chronic medications)  Goal of Therapy:  INR 2.0 - 3.0 Monitor platelets by anticoagulation protocol: Yes   Plan:  9/28:  INR @ 0500 = 4.7, elevated - Will hold warfarin for 9/28 and give Vit K 2.5 PO X 1.  Monitor INR daily Continue to monitor H/H and platelets   Deepa Barthel D 08/09/2023,6:53 AM

## 2023-08-09 NOTE — Plan of Care (Signed)
  Problem: Education: Goal: Knowledge of General Education information will improve Description: Including pain rating scale, medication(s)/side effects and non-pharmacologic comfort measures Outcome: Progressing   Problem: Clinical Measurements: Goal: Respiratory complications will improve Outcome: Progressing   Problem: Elimination: Goal: Will not experience complications related to urinary retention Outcome: Progressing   Problem: Safety: Goal: Ability to remain free from injury will improve Outcome: Progressing   

## 2023-08-10 ENCOUNTER — Inpatient Hospital Stay: Payer: 59 | Admitting: Radiology

## 2023-08-10 DIAGNOSIS — S301XXA Contusion of abdominal wall, initial encounter: Secondary | ICD-10-CM | POA: Diagnosis not present

## 2023-08-10 HISTORY — PX: IR EMBO ART  VEN HEMORR LYMPH EXTRAV  INC GUIDE ROADMAPPING: IMG5450

## 2023-08-10 LAB — CBC WITH DIFFERENTIAL/PLATELET
Abs Immature Granulocytes: 0.06 10*3/uL (ref 0.00–0.07)
Basophils Absolute: 0 10*3/uL (ref 0.0–0.1)
Basophils Relative: 0 %
Eosinophils Absolute: 0.1 10*3/uL (ref 0.0–0.5)
Eosinophils Relative: 1 %
HCT: 25.5 % — ABNORMAL LOW (ref 36.0–46.0)
Hemoglobin: 8.9 g/dL — ABNORMAL LOW (ref 12.0–15.0)
Immature Granulocytes: 1 %
Lymphocytes Relative: 4 %
Lymphs Abs: 0.5 10*3/uL — ABNORMAL LOW (ref 0.7–4.0)
MCH: 30.7 pg (ref 26.0–34.0)
MCHC: 34.9 g/dL (ref 30.0–36.0)
MCV: 87.9 fL (ref 80.0–100.0)
Monocytes Absolute: 0.9 10*3/uL (ref 0.1–1.0)
Monocytes Relative: 8 %
Neutro Abs: 10.3 10*3/uL — ABNORMAL HIGH (ref 1.7–7.7)
Neutrophils Relative %: 86 %
Platelets: 220 10*3/uL (ref 150–400)
RBC: 2.9 MIL/uL — ABNORMAL LOW (ref 3.87–5.11)
RDW: 14.1 % (ref 11.5–15.5)
WBC: 11.9 10*3/uL — ABNORMAL HIGH (ref 4.0–10.5)
nRBC: 0 % (ref 0.0–0.2)

## 2023-08-10 LAB — CBC
HCT: 26.2 % — ABNORMAL LOW (ref 36.0–46.0)
Hemoglobin: 9.2 g/dL — ABNORMAL LOW (ref 12.0–15.0)
MCH: 30.8 pg (ref 26.0–34.0)
MCHC: 35.1 g/dL (ref 30.0–36.0)
MCV: 87.6 fL (ref 80.0–100.0)
Platelets: 225 10*3/uL (ref 150–400)
RBC: 2.99 MIL/uL — ABNORMAL LOW (ref 3.87–5.11)
RDW: 13.9 % (ref 11.5–15.5)
WBC: 13 10*3/uL — ABNORMAL HIGH (ref 4.0–10.5)
nRBC: 0 % (ref 0.0–0.2)

## 2023-08-10 LAB — PROTIME-INR
INR: 2.5 — ABNORMAL HIGH (ref 0.8–1.2)
Prothrombin Time: 27.1 s — ABNORMAL HIGH (ref 11.4–15.2)

## 2023-08-10 LAB — LACTIC ACID, PLASMA: Lactic Acid, Venous: 0.8 mmol/L (ref 0.5–1.9)

## 2023-08-10 LAB — PROCALCITONIN: Procalcitonin: 0.25 ng/mL

## 2023-08-10 MED ORDER — MIDAZOLAM HCL 2 MG/2ML IJ SOLN
INTRAMUSCULAR | Status: AC | PRN
Start: 2023-08-10 — End: 2023-08-10
  Administered 2023-08-10: 1 mg via INTRAVENOUS

## 2023-08-10 MED ORDER — MIDAZOLAM HCL 2 MG/2ML IJ SOLN
INTRAMUSCULAR | Status: AC
  Filled 2023-08-10: qty 4

## 2023-08-10 MED ORDER — SODIUM CHLORIDE 0.9 % IV BOLUS
INTRAVENOUS | Status: AC | PRN
  Administered 2023-08-10: 250 mL via INTRAVENOUS

## 2023-08-10 MED ORDER — IOHEXOL 300 MG/ML  SOLN
85.0000 mL | Freq: Once | INTRAMUSCULAR | Status: AC | PRN
  Administered 2023-08-10: 85 mL via INTRA_ARTERIAL

## 2023-08-10 MED ORDER — SODIUM CHLORIDE 0.9 % IV SOLN: INTRAVENOUS | Status: DC

## 2023-08-10 MED ORDER — LIDOCAINE HCL 1 % IJ SOLN
7.0000 mL | Freq: Once | INTRAMUSCULAR | Status: AC
  Administered 2023-08-10: 7 mL via INTRADERMAL
  Filled 2023-08-10: qty 7

## 2023-08-10 MED ORDER — GELATIN ABSORBABLE 12-7 MM EX MISC
1.0000 | Freq: Once | CUTANEOUS | Status: AC
  Administered 2023-08-10: 1 via TOPICAL
  Filled 2023-08-10: qty 1

## 2023-08-10 MED ORDER — AMLODIPINE BESYLATE 5 MG PO TABS
5.0000 mg | ORAL_TABLET | Freq: Every day | ORAL | Status: DC
  Administered 2023-08-11 – 2023-08-13 (×3): 5 mg via ORAL
  Filled 2023-08-10 (×3): qty 1

## 2023-08-10 MED ORDER — LIDOCAINE HCL 1 % IJ SOLN
INTRAMUSCULAR | Status: AC
  Filled 2023-08-10: qty 20

## 2023-08-10 MED ORDER — FENTANYL CITRATE (PF) 100 MCG/2ML IJ SOLN
INTRAMUSCULAR | Status: AC
  Filled 2023-08-10: qty 2

## 2023-08-10 MED ORDER — WARFARIN SODIUM 5 MG PO TABS
5.0000 mg | ORAL_TABLET | Freq: Once | ORAL | Status: AC
  Administered 2023-08-10: 5 mg via ORAL
  Filled 2023-08-10: qty 1

## 2023-08-10 MED ORDER — FENTANYL CITRATE (PF) 100 MCG/2ML IJ SOLN
INTRAMUSCULAR | Status: AC | PRN
Start: 2023-08-10 — End: 2023-08-10
  Administered 2023-08-10: 50 ug via INTRAVENOUS

## 2023-08-10 NOTE — Progress Notes (Addendum)
Progress Note    Nancy [name]  WUJ:[id number] DOB: 01-Sep-1961  DOA: 08/06/2023 PCP: Jacky Kindle, FNP      Brief Narrative:    Medical records reviewed and are as summarized below:  Nancy [name] is a 62 y.o. 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  She was discharged on Lovenox bridge 90 mg every 12 hours for 7 days along with warfarin 5 mg daily with goal INR of 2-3 as recommended by her CT surgeon.  She presented to the hospital with abdominal pain mainly in the left lower quadrant.  Abdominal pain started a day prior to admission.  She had been taking her Lovenox injection as prescribed.   She was found to have abdominal wall hematoma.      Assessment/Plan:   Principal Problem:   Abdominal wall hematoma Active Problems:   H/O mechanical aortic valve replacement   Adult hypothyroidism   Essential (primary) hypertension   Chronic obstructive pulmonary disease (HCC)   OSA (obstructive sleep apnea)   Acute blood loss anemia   Acute hypoxemic respiratory failure (HCC)   Toxic encephalopathy    Body mass index is 28.61 kg/m.   Abdominal wall hematoma measuring 7.2 x 5.3 x 9.6 cm, abdominal pain:  Repeat CT abdomen and pelvis done on 08/09/2023 showed enlarging left rectus abdominis hematoma.  Case was discussed with Dr. Archer Asa interventional radiologist.  He said patient is a candidate for angiogram. Patient is now s/p left  inferior epigastric angiogram and embolization. Continue analgesics as needed for pain.   Acute blood loss anemia: S/p transfusion with 1 unit of PRBCs on 08/09/2023.  Hemoglobin improved from 8.1-9.2.  Monitor H&H closely and transfuse as needed.   S/p Saint Jude mechanical aortic valve replacement, on Coumadin coagulopathy: INR is down to 2.5.  S/p vitamin K 2.5 mg x 1 dose on 08/09/2023. Monitor INR closely and adjust warfarin accordingly. Goal INR is 2-3.   Bilateral basilar lung infiltrates on CT abdomen pelvis: Patient does not have any respiratory symptoms or fever.  Discussed with Dr. Archer Asa, radiologist.  He thinks infiltrates may be due to atelectasis or perhaps aspiration. Procalcitonin and lactic acid levels were normal.  Ordered blood cultures.  No antibiotics for now.  Monitor closely with low threshold to start antibiotics.  Patient has been advised to inform medical staff if she develops any fever, chills or respiratory symptoms.   Dizziness on standing/ambulating: Improved after IV fluids. There was no evidence of orthostatic hypotension.   Acute hypoxemic respiratory failure and acute toxic encephalopathy on 08/08/2023: Resolved.  This was likely from frequent use of IV Dilaudid.   Hypertension: Hold amlodipine and Lasix.  Continue metoprolol    OSA: Continue 2 L/min oxygen nightly and oxygen as needed.   COPD: Stable.  Continue bronchodilators   Other comorbidities include hypothyroidism, s/p bronchoscopic lung volume reduction surgery with endobronchial valves to RML/RUL on 07/24/2023, chronic dissection of abdominal aorta   Plan discussed with Nancy [name], sister, at the bedside  Diet Order  Diet NPO time specified Except for: Sips with Meds  Diet effective now                            Consultants: Interventional radiologist  Procedures: None    Medications:    [START ON 08/11/2023] amLODipine  5 mg Oral Daily   doxycycline   20 mg Oral BID   DULoxetine  40 mg Oral QHS   DULoxetine  60 mg Oral Daily   Fluticasone-Umeclidin-Vilant  1 Inhalation Inhalation Daily   furosemide  40 mg Oral Daily   ipratropium-albuterol  3 mL Nebulization TID   levothyroxine  125 mcg Oral Q0600   metoprolol succinate  25 mg Oral Daily   sodium chloride flush  3 mL Intravenous Q12H   warfarin  5 mg Oral ONCE-1600   Warfarin - Pharmacist Dosing Inpatient   Does not apply q1600   Continuous Infusions:  sodium chloride 10 mL/hr at 08/10/23 1252   sodium chloride        Anti-infectives (From admission, onward)    Start     Dose/Rate Route Frequency Ordered Stop   08/06/23 2200  doxycycline (PERIOSTAT) tablet 20 mg        20 mg Oral 2 times daily 08/06/23 1909                Family Communication/Anticipated D/C date and plan/Code Status   DVT prophylaxis:  warfarin (COUMADIN) tablet 5 mg     Code Status: Full Code  Family Communication: Plan discussed with Nancy [name], sister, at the bedside Disposition Plan: Plan to discharge home   Status is: Inpatient Remains inpatient appropriate because: Abdominal wall hematoma         Subjective:   Interval events noted.  She still complains of left lower abdominal pain.  No dizziness, chest pain or shortness of breath.  Nancy [name], sister, was at the bedside  Objective:    Vitals:   08/10/23 1405 08/10/23 1410 08/10/23 1415 08/10/23 1420  BP: 96/76 90/63 105/72 96/68  Pulse: 99 (!) 103 96 96  Resp: 12 14 15 14   Temp:      TempSrc:      SpO2: 94% 96% 96% 96%  Weight:      Height:       No data found.   Intake/Output Summary (Last 24 hours) at 08/10/2023 1421 Last data filed at 08/09/2023 1630 Gross per 24 hour  Intake 280 ml  Output --  Net 280 ml   Filed Weights   08/06/23 1313  Weight: 84.1 kg    Exam:  GEN: NAD SKIN: Warm and dry EYES: EOMI ENT: MMM CV: RRR PULM: Bilateral expiratory wheezing ABD: soft, ND, LLQ tenderness, +BS CNS: AAO x 3,  non focal EXT: No edema or tenderness     Data Reviewed:   I have personally reviewed following labs and imaging studies:  Labs: Labs show the following:   Basic Metabolic Panel: Recent Labs  Lab 08/06/23 1251 08/09/23 0500  NA 134* 136  K 3.4* 3.5  CL 99 102  CO2 24 26  GLUCOSE 103* 108*  BUN 15 11  CREATININE 0.79 0.70  CALCIUM 8.8* 8.0*  MG  --  2.0   GFR Estimated Creatinine Clearance: 84.2 mL/min (by C-G formula based on SCr of 0.7 mg/dL). Liver Function Tests: Recent Labs  Lab 08/06/23 1251  AST 30  ALT 49*  ALKPHOS 113  BILITOT 1.1  PROT 7.4  ALBUMIN 4.0   Recent Labs  Lab 08/06/23 1251  LIPASE 22   No results for input(s): "AMMONIA" in the last 168 hours. Coagulation profile Recent Labs  Lab 08/06/23 1339 08/07/23 0427 08/08/23 0507 08/09/23 0500 08/10/23 0436  INR 1.9* 2.1* 3.3* 4.7* 2.5*    CBC: Recent Labs  Lab 08/07/23 0427 08/08/23 0507 08/09/23 0500 08/10/23 0436 08/10/23 1115  WBC 11.5* 17.3* 10.6* 13.0* 11.9*  NEUTROABS  --   --  8.7*  --  10.3*  HGB 10.4* 9.7* 8.1* 9.2* 8.9*  HCT 30.7* 29.0* 24.0* 26.2* 25.5*  MCV 89.5 91.5 91.3 87.6 87.9  PLT 246 263 211 225 220   Cardiac Enzymes: No results for input(s): "CKTOTAL", "CKMB", "CKMBINDEX", "TROPONINI" in the last 168 hours. BNP (last 3 results) No results for input(s): "PROBNP" in the last 8760 hours. CBG: No results for input(s): "GLUCAP" in the last 168 hours. D-Dimer: No results for input(s): "DDIMER" in the last 72 hours. Hgb A1c: No results for input(s): "HGBA1C" in the last 72 hours. Lipid Profile: No results for input(s): "CHOL", "HDL", "LDLCALC", "TRIG", "CHOLHDL", "LDLDIRECT" in the last 72 hours. Thyroid function studies: No results for input(s): "TSH", "T4TOTAL", "T3FREE", "THYROIDAB" in the last 72 hours.  Invalid input(s): "FREET3" Anemia work up: No results for input(s): "VITAMINB12", "FOLATE", "FERRITIN", "TIBC", "IRON", "RETICCTPCT" in the last 72  hours. Sepsis Labs: Recent Labs  Lab 08/08/23 0507 08/09/23 0500 08/10/23 0436 08/10/23 1115  PROCALCITON  --   --   --  0.25  WBC 17.3* 10.6* 13.0* 11.9*  LATICACIDVEN  --   --   --  0.8    Microbiology No results found for this or any previous visit (from the past 240 hour(s)).  Procedures and diagnostic studies:  CT ABDOMEN PELVIS WO CONTRAST  Result Date: 08/09/2023 CLINICAL DATA:  Left abdominal wall hematoma. Left lower abdominal pain. History of aortic valve replacement and repair of the aortic dissection in 2010. EXAM: CT ABDOMEN AND PELVIS WITHOUT CONTRAST TECHNIQUE: Multidetector CT imaging of the abdomen and pelvis was performed following the standard protocol without IV contrast. RADIATION DOSE REDUCTION: This exam was performed according to the departmental dose-optimization program which includes automated exposure control, adjustment of the mA and/or kV according to patient size and/or use of iterative reconstruction technique. COMPARISON:  None 03/31/2023 FINDINGS: Lower chest: Patchy infiltrates are suggested in the lung bases, most likely multifocal pneumonia. Aspiration or edema could less likely have this appearance. Small left pleural effusion. Stent graft in the lower thoracic aorta. Hepatobiliary: Increased density in the gallbladder may represent milk of calcium, layering tiny stones, or sludge. No wall thickening or inflammatory stranding. No bile duct dilatation. Circumscribed low-attenuation lesion in the dome of the liver is unchanged since prior study consistent with a benign cyst. No imaging follow-up is indicated. Pancreas: Unremarkable. No pancreatic ductal dilatation or surrounding inflammatory changes. Spleen: Normal in size without focal abnormality. Adrenals/Urinary Tract: No adrenal gland nodules. Prominent right renal hydronephrosis with decompressed ureter likely representing ureteropelvic junction stenosis. This is unchanged since prior study and may be  chronic. Two nonobstructing stones are demonstrated in the right kidney, largest measuring 3 mm diameter. No change. The left kidney, left ureter, and the bladder are unremarkable. Stomach/Bowel: Stomach, small bowel, and colon are not abnormally distended. No wall thickening or inflammatory changes. Appendix is not identified. Vascular/Lymphatic: There is evidence of chronic dissection of the aorta with displacement of intimal calcifications. This is better visualized on the prior study with  IV contrast material. Lack of IV contrast material limits evaluation of vascular structures but there is no apparent change in the appearance of the aorta since the previous study. No significant lymphadenopathy. Reproductive: Status post hysterectomy. No adnexal masses. Other: No free air or free fluid in the abdomen. There is a hematoma in the left lower rectus abdominus muscles measuring up to 7.5 x 8.1 cm in diameter. This is enlarging since the previous study. Infiltration in the anterior abdominal wall fat likely due to contusion. Cellulitis or edema could also have this appearance. Tiny subcutaneous fatty gas collections are demonstrated, likely representing sites of injection. Musculoskeletal: No acute bony abnormalities. IMPRESSION: 1. Left rectus abdominus hematoma is enlarging since prior study. Infiltration in the anterior abdominal wall subcutaneous fat likely representing contusion. 2. 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 3. Aortic atherosclerosis with changes consistent with chronic abdominal aortic dissection. This is better demonstrated on previous contrast-enhanced examination but no obvious changes identified. 4. Infiltrates in the lung bases most likely representing multifocal pneumonia. Small pleural effusions. 5. Nonobstructing intrarenal stones in the right kidney. 6. Milk of calcium versus small layering stones or sludge in the gallbladder. No  acute inflammatory changes. Electronically Signed   By: Burman Nieves M.D.   On: 08/09/2023 20:58               LOS: 3 days   Anshu Wehner  [facility]   Pager on [email or web address]. If 7PM-7AM, please contact night-coverage at [email or web address]     08/10/2023, 2:21 PM

## 2023-08-10 NOTE — Plan of Care (Signed)
  Problem: Health Behavior/Discharge Planning: Goal: Ability to manage health-related needs will improve Outcome: Progressing   Problem: Clinical Measurements: Goal: Ability to maintain clinical measurements within normal limits will improve Outcome: Progressing Goal: Will remain free from infection Outcome: Progressing Goal: Diagnostic test results will improve Outcome: Progressing Goal: Respiratory complications will improve Outcome: Progressing Goal: Cardiovascular complication will be avoided Outcome: Progressing   Problem: Education: Goal: Knowledge of General Education information will improve Description: Including pain rating scale, medication(s)/side effects and non-pharmacologic comfort measures Outcome: Progressing

## 2023-08-10 NOTE — Procedures (Signed)
Interventional Radiology Procedure Note  Procedure: Left inferior epigastric angiogram and embolization  Complications: None  Estimated Blood Loss: None  Recommendations: - Bedrest x 1 hr - Maintain INR in acceptable range, avoid over anticoagulation   Signed,  Sterling Big, MD

## 2023-08-10 NOTE — Consult Note (Addendum)
ANTICOAGULATION CONSULT NOTE  Pharmacy Consult for Warfarin  Indication: Mechanical Aortic Valve  Patient Measurements: Height: 5' 7.5" (171.5 cm) Weight: 84.1 kg (185 lb 6.5 oz) IBW/kg (Calculated) : 62.75  Vital Signs: Temp: 98 F (36.7 C) (09/29 0830) Temp Source: Oral (09/29 0830) BP: 138/63 (09/29 0830) Pulse Rate: 71 (09/29 0830)  Labs: Recent Labs    08/08/23 0507 08/09/23 0500 08/10/23 0436  HGB 9.7* 8.1* 9.2*  HCT 29.0* 24.0* 26.2*  PLT 263 211 225  LABPROT 33.6* 44.3* 27.1*  INR 3.3* 4.7* 2.5*  CREATININE  --  0.70  --    Estimated Creatinine Clearance: 84.2 mL/min (by C-G formula based on SCr of 0.7 mg/dL).  Interacting Medications:  PTA Meds - Lovenox, levothyroxine, doxycycline  Assessment: Nancy [name] is a 62 year old female presenting with an abdominal wall hematoma with evidence of active bleeding. IR was consulted and plan is for conservative management at this time. Patient has history of bicuspid aortic valve plus an aortic dissection for which she underwent AVR and is currently on warfarin with an original goal INR of 2.5-3.5. Per fill history and outpatient records, home regimen is 5 mg daily.   DDIs: duloxetine, levothyroxine, doxycycline (all are chronic medications)  Date INR Warfarin Dose  9/29 2.5 5 mg         Goal of Therapy:  INR 2.0 - 3.0  Monitor platelets by anticoagulation protocol: Yes   Plan:  INR is therapeutic today. It was supratherapeutic 9/28 and warfarin was held. Will give warfarin 5 mg x 1 tonight (Home regimen). Predict INR may continue to trend down.  Will aim for an INR between 2.5 to 3 due to AVR. Daily INR. CBC at least every 3 days.   Ronnald Ramp, PharmD, BCPS 08/10/2023,9:29 AM

## 2023-08-10 NOTE — Consult Note (Signed)
Chief Complaint: Patient was seen in consultation today for  Chief Complaint  Patient presents with   Abdominal Pain   at the request of Lurene Shadow, MD  Referring Physician(s): Dr. Lurene Shadow   Patient Status: [facility] - In-pt  History of Present Illness: Nancy [name] is a 62 y.o. female currently admitted for spontaneous left rectus sheath hematoma in the setting of supratherapeutic anticoagulation for mechanical aortic valve.    She has been on bedrest and had INR corrected down from 4 to 2.1 but continues to have severe left abdominal wall pain and decreasing hemoglobin concerning for continued bleeding.   She is a candidate for angiogram and embolization.   Past Medical History:  Diagnosis Date   Aortic dissection (HCC) 2010   COPD (chronic obstructive pulmonary disease) (HCC)    Depression    Hypertension    Hypothyroidism    Pneumonia    PONV (postoperative nausea and vomiting)    Rosacea    Seasonal allergies    Sleep apnea    Does not use CPAP    Past Surgical History:  Procedure Laterality Date   ABDOMINAL HYSTERECTOMY  2007   AORTIC VALVE REPLACEMENT  06/2009   repeated 2017   APPENDECTOMY  1985   BREAST BIOPSY Right 11/2020   benign   BREAST CYST ASPIRATION     does not remember any details   CARPAL TUNNEL RELEASE Left 04/12/2013   elbow   CATARACT EXTRACTION Right 1994   FOOT SURGERY Left 1997-1998   ORIF ANKLE FRACTURE Right 12/15/2020   Procedure: OPEN REDUCTION INTERNAL FIXATION (ORIF) RIGHT TRIMALLEOLAR ANKLE FRACTURE;  Surgeon: [name], MD;  Location: [facility];  Service: Orthopedics;  Laterality: Right;    Allergies: Codeine  Medications: Prior to Admission medications   Medication Sig Start Date End Date Taking? Authorizing Provider  albuterol (VENTOLIN HFA) 108 (90 Base) MCG/ACT inhaler SMARTSIG:2 Inhalation Via Inhaler Every 6 Hours PRN 08/14/21  Yes [provider]  amLODipine (NORVASC) 5 MG tablet Take 5  mg by mouth daily. 07/30/11  Yes [provider]  aspirin EC 81 MG tablet Take by mouth. 09/28/22 09/28/23 Yes [provider]  doxycycline (PERIOSTAT) 20 MG tablet TAKE 1 TABLET BY MOUTH TWICE DAILY 03/25/23  Yes Deirdre Evener, MD  furosemide (LASIX) 40 MG tablet Take 40 mg by mouth 2 (two) times daily. 07/30/11  Yes [provider]  ipratropium-albuterol (DUONEB) 0.5-2.5 (3) MG/3ML SOLN Inhale into the lungs. 07/28/23  Yes [provider]  levothyroxine (SYNTHROID) 125 MCG tablet Take 1 tablet (125 mcg total) by mouth daily before breakfast. 02/23/23  Yes Jacky Kindle, FNP  metoprolol succinate (TOPROL-XL) 25 MG 24 hr tablet Take 25 mg by mouth daily.   Yes [provider]  ondansetron (ZOFRAN ODT) 4 MG disintegrating tablet Take 1 tablet (4 mg total) by mouth every 8 (eight) hours as needed for nausea or vomiting. 10/01/22  Yes Jacky Kindle, FNP  TRELEGY ELLIPTA 100-62.5-25 MCG/ACT AEPB Inhale into the lungs. 10/08/22  Yes [provider]  warfarin (COUMADIN) 5 MG tablet Take 5 mg by mouth every evening. Per cardiologist: patient supposed to take 7.5 mg 9/24 and 9/25 evening for low INR then resume 5 mg nightly  Goal INR 2.5-3.5 06/01/14  Yes [provider]  DULoxetine (CYMBALTA) 20 MG capsule Take 2 capsules (40 mg total) by mouth 2 (two) times daily. Patient taking differently: Take 40-60 mg by mouth 2 (two) times daily.  60 mg am, 40 mg hs 01/28/23   Merita Norton T, FNP  enoxaparin (LOVENOX) 100 MG/ML injection Inject into the skin.    [provider]  valACYclovir (VALTREX) 1000 MG tablet     [provider]     Family History  Problem Relation Age of Onset   Pneumonia Mother    Breast cancer Mother 34       twice 73's and 81   Hypertension Father    Heart disease Father    Healthy Sister    Colon cancer Maternal Grandmother    Heart disease Paternal Grandmother    Prostate cancer Neg Hx    Kidney  cancer Neg Hx     Social History   Socioeconomic History   Marital status: Single    Spouse name: Not on file   Number of children: 0   Years of education: 13   Highest education level: High school graduate  Occupational History    Employer: [employer]  Tobacco Use   Smoking status: Former    Current packs/day: 0.00    Average packs/day: 0.5 packs/day for 30.0 years (15.0 ttl pk-yrs)    Types: Cigarettes    Start date: 03/11/1986    Quit date: 03/11/2016    Years since quitting: 7.4   Smokeless tobacco: Never   Tobacco comments:    patient states  that she is working at her own pace to quit smoking  Vaping Use   Vaping status: Former  Substance and Sexual Activity   Alcohol use: Yes    Alcohol/week: 0.0 standard drinks of alcohol    Comment: OCCASIONALLY   Drug use: No   Sexual activity: Not on file  Other Topics Concern   Not on file  Social History Narrative   Not on file   Social Determinants of Health   Financial Resource Strain: Low Risk  (07/22/2023)   Received from [facility]   Overall Financial Resource Strain (CARDIA)    Difficulty of Paying Living Expenses: Not very hard  Food Insecurity: No Food Insecurity (08/06/2023)   Hunger Vital Sign    Worried About Running Out of Food in the Last Year: Never true    Ran Out of Food in the Last Year: Never true  Transportation Needs: No Transportation Needs (08/06/2023)   PRAPARE - Administrator, Civil Service (Medical): No    Lack of Transportation (Non-Medical): No  Physical Activity: Inactive (01/02/2018)   Exercise Vital Sign    Days of Exercise per Week: 0 days    Minutes of Exercise per Session: 0 min  Stress: Not on file  Social Connections: Not on file     Review of Systems: A 12 point ROS discussed and pertinent positives are indicated in the HPI above.  All other systems are negative.  Review of Systems  Vital Signs: BP 95/69   Pulse (!) 103   Temp 98 F (36.7 C)  (Oral)   Resp 17   Ht 5' 7.5" (1.715 m)   Wt 84.1 kg   SpO2 98%   BMI 28.61 kg/m     Physical Exam Constitutional:      General: She is not in acute distress.    Appearance: She is obese. She is not toxic-appearing.  HENT:     Head: Normocephalic and atraumatic.  Eyes:     General: No scleral icterus. Cardiovascular:     Rate and Rhythm: Tachycardia present.  Pulmonary:  Effort: Pulmonary effort is normal.  Abdominal:     Palpations: Abdomen is soft.     Tenderness: There is abdominal tenderness.  Skin:    General: Skin is warm and dry.  Neurological:     Mental Status: She is alert and oriented to person, place, and time.     Imaging: CT ABDOMEN PELVIS WO CONTRAST  Result Date: 08/09/2023 CLINICAL DATA:  Left abdominal wall hematoma. Left lower abdominal pain. History of aortic valve replacement and repair of the aortic dissection in 2010. EXAM: CT ABDOMEN AND PELVIS WITHOUT CONTRAST TECHNIQUE: Multidetector CT imaging of the abdomen and pelvis was performed following the standard protocol without IV contrast. RADIATION DOSE REDUCTION: This exam was performed according to the departmental dose-optimization program which includes automated exposure control, adjustment of the mA and/or kV according to patient size and/or use of iterative reconstruction technique. COMPARISON:  None 03/31/2023 FINDINGS: Lower chest: Patchy infiltrates are suggested in the lung bases, most likely multifocal pneumonia. Aspiration or edema could less likely have this appearance. Small left pleural effusion. Stent graft in the lower thoracic aorta. Hepatobiliary: Increased density in the gallbladder may represent milk of calcium, layering tiny stones, or sludge. No wall thickening or inflammatory stranding. No bile duct dilatation. Circumscribed low-attenuation lesion in the dome of the liver is unchanged since prior study consistent with a benign cyst. No imaging follow-up is indicated. Pancreas:  Unremarkable. No pancreatic ductal dilatation or surrounding inflammatory changes. Spleen: Normal in size without focal abnormality. Adrenals/Urinary Tract: No adrenal gland nodules. Prominent right renal hydronephrosis with decompressed ureter likely representing ureteropelvic junction stenosis. This is unchanged since prior study and may be chronic. Two nonobstructing stones are demonstrated in the right kidney, largest measuring 3 mm diameter. No change. The left kidney, left ureter, and the bladder are unremarkable. Stomach/Bowel: Stomach, small bowel, and colon are not abnormally distended. No wall thickening or inflammatory changes. Appendix is not identified. Vascular/Lymphatic: There is evidence of chronic dissection of the aorta with displacement of intimal calcifications. This is better visualized on the prior study with IV contrast material. Lack of IV contrast material limits evaluation of vascular structures but there is no apparent change in the appearance of the aorta since the previous study. No significant lymphadenopathy. Reproductive: Status post hysterectomy. No adnexal masses. Other: No free air or free fluid in the abdomen. There is a hematoma in the left lower rectus abdominus muscles measuring up to 7.5 x 8.1 cm in diameter. This is enlarging since the previous study. Infiltration in the anterior abdominal wall fat likely due to contusion. Cellulitis or edema could also have this appearance. Tiny subcutaneous fatty gas collections are demonstrated, likely representing sites of injection. Musculoskeletal: No acute bony abnormalities. IMPRESSION: 1. Left rectus abdominus hematoma is enlarging since prior study. Infiltration in the anterior abdominal wall subcutaneous fat likely representing contusion. 2. 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 3. Aortic atherosclerosis with changes consistent with chronic abdominal aortic dissection. This  is better demonstrated on previous contrast-enhanced examination but no obvious changes identified. 4. Infiltrates in the lung bases most likely representing multifocal pneumonia. Small pleural effusions. 5. Nonobstructing intrarenal stones in the right kidney. 6. Milk of calcium versus small layering stones or sludge in the gallbladder. No acute inflammatory changes. Electronically Signed   By: Burman Nieves M.D.   On: 08/09/2023 20:58   CT ABDOMEN PELVIS W CONTRAST  Result Date: 08/06/2023 CLINICAL DATA:  Abdominal pain in the left lower quadrant. Pain  started yesterday is worsening today. EXAM: CT ABDOMEN AND PELVIS WITH CONTRAST TECHNIQUE: Multidetector CT imaging of the abdomen and pelvis was performed using the standard protocol following bolus administration of intravenous contrast. RADIATION DOSE REDUCTION: This exam was performed according to the departmental dose-optimization program which includes automated exposure control, adjustment of the mA and/or kV according to patient size and/or use of iterative reconstruction technique. CONTRAST:  OMNIPAQUE IOHEXOL 300 MG/ML  SOLN COMPARISON:  09/24/2006. [facility] CT from 10/16/2015 is available. FINDINGS: Lower chest: Mild cylindrical bronchiectasis noted in the lung bases with pleuroparenchymal scarring in the lower lungs bilaterally. Hepatobiliary: 16 mm low-density lesion in the left liver is stable compatible with benign etiology such as a cyst. No followup imaging is recommended. Gallbladder is distended with possible tiny dependent stone towards the neck (23/2). No intrahepatic or extrahepatic biliary dilation. Pancreas: No focal mass lesion. No dilatation of the main duct. No intraparenchymal cyst. No peripancreatic edema. Spleen: No splenomegaly. No suspicious focal mass lesion. Adrenals/Urinary Tract: No adrenal nodule or mass. Marked right-sided hydronephrosis is stable in the interval without evidence for  hydroureter, features suggesting chronic UPJ obstruction. Thinning of the overlying cortex is consistent with a chronic process. 3 mm nonobstructing interpolar stone seen in the right kidney on 32/2. 15 mm low-density well-defined homogeneous subcapsular lesion in the lower pole left kidney has attenuation higher than would be expected for a simple cyst. This lesion was present on the 2016 exam measuring 5 mm at that time. Left ureter unremarkable. The urinary bladder appears normal for the degree of distention. Stomach/Bowel: Stomach is unremarkable. No gastric wall thickening. No evidence of outlet obstruction. Duodenum is normally positioned as is the ligament of Treitz. No small bowel wall thickening. No small bowel dilatation. The terminal ileum is normal. The appendix is not well visualized, but there is no edema or inflammation in the region of the cecal tip to suggest appendicitis. No gross colonic mass. No colonic wall thickening. A few scattered diverticuli are seen in the left colon without diverticulitis. Vascular/Lymphatic: Inferior aspect of an aortic stent graft noted in the lower thoracic aorta extending to about the level of the diaphragmatic hiatus. Distal to the stent is a chronic dissection of the abdominal aorta with dissection flap extending into the bifurcation and both common iliac arteries as before. Flap is visible extending into the left external iliac artery as on the prior study. There is no gastrohepatic or hepatoduodenal ligament lymphadenopathy. No retroperitoneal or mesenteric lymphadenopathy. No pelvic sidewall lymphadenopathy. Reproductive: Hysterectomy.  There is no adnexal mass. Other: No intraperitoneal free fluid. Musculoskeletal: A left rectus sheath hematoma measures on the order of 7.2 x 4.3 x 9.6 cm. Layering high attenuation material within the hematoma is compatible with contrast extravasation consistent with active/ongoing bleeding. There is edema and hemorrhage in the  extraperitoneal soft tissues of the anterior left pelvic floor and around the left rectus sheath. No worrisome lytic or sclerotic osseous abnormality. IMPRESSION: 1. 7.2 x 4.3 x 9.6 cm left rectus sheath hematoma with evidence of active/ongoing bleeding. 2. 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 Imaging appearance not substantially changed since 10/16/2015 exam. 3. 15 mm low-density well-defined homogeneous subcapsular lesion in the lower pole left kidney has attenuation higher than would be expected for a simple cyst. This lesion was present on the 2016 exam measuring 5 mm at that time. Slow progression over such a long time frame this reassuring for  benign etiology such as proteinaceous or hemorrhagic cyst. Follow-up outpatient nonemergent MRI abdomen with and without contrast could be used to confirm as clinically warranted. 4. Chronic dissection of the abdominal aorta with dissection flap extending into the bifurcation and both common iliac arteries as before. Flap is visible extending into the left external iliac artery as on the prior study. 5. Mild cylindrical bronchiectasis in the lung bases with pleuroparenchymal scarring in the lower lungs bilaterally. 6.  Aortic Atherosclerosis (ICD10-I70.0). I personally called these results to the emergency department at 1600 hours. Dr. Arnoldo Morale, the ordering physician was no longer on duty and Dr. Roxan Hockey was unavailable due to caring for another patient. As such, I relayed these findings to the patient's nurse who will communicate them to Dr. Roxan Hockey. Electronically Signed   By: Kennith Center M.D.   On: 08/06/2023 16:01    Labs:  CBC: Recent Labs    08/08/23 0507 08/09/23 0500 08/10/23 0436 08/10/23 1115  WBC 17.3* 10.6* 13.0* 11.9*  HGB 9.7* 8.1* 9.2* 8.9*  HCT 29.0* 24.0* 26.2* 25.5*  PLT 263 211 225 220    COAGS: Recent Labs    08/07/23 0427 08/08/23 0507 08/09/23 0500  08/10/23 0436  INR 2.1* 3.3* 4.7* 2.5*    BMP: Recent Labs    08/06/23 1251 08/09/23 0500  NA 134* 136  K 3.4* 3.5  CL 99 102  CO2 24 26  GLUCOSE 103* 108*  BUN 15 11  CALCIUM 8.8* 8.0*  CREATININE 0.79 0.70  GFRNONAA >60 >60    LIVER FUNCTION TESTS: Recent Labs    08/06/23 1251  BILITOT 1.1  AST 30  ALT 49*  ALKPHOS 113  PROT 7.4  ALBUMIN 4.0    TUMOR MARKERS: No results for input(s): "AFPTM", "CEA", "CA199", "CHROMGRNA" in the last 8760 hours.  Assessment and Plan:  Persistent pain, decreasing hgb concerning for persistent bleeding at rectus sheath hematoma.  Anticoagulation cannot be stopped completely due to mechanical heart valve.  To IR for embolization.   Risks, benefits and alternatives discussed with patient and her sister who understand and agree.  Thank you for this interesting consult.  I greatly enjoyed meeting Nancy [name] and look forward to participating in their care.  A copy of this report was sent to the requesting provider on this date.  Electronically Signed: Sterling Big, MD 08/10/2023, 2:36 PM   I spent a total of 20 Minutes  in face to face in clinical consultation, greater than 50% of which was counseling/coordinating care for left rectus sheath hematoma with active bleeding

## 2023-08-11 DIAGNOSIS — S301XXA Contusion of abdominal wall, initial encounter: Secondary | ICD-10-CM | POA: Diagnosis not present

## 2023-08-11 LAB — BASIC METABOLIC PANEL
Anion gap: 12 (ref 5–15)
BUN: 9 mg/dL (ref 6–20)
CO2: 28 mmol/L (ref 22–32)
Calcium: 8.4 mg/dL — ABNORMAL LOW (ref 8.9–10.3)
Chloride: 97 mmol/L — ABNORMAL LOW (ref 98–111)
Creatinine, Ser: 0.65 mg/dL (ref 0.44–1.00)
GFR, Estimated: >60 mL/min (ref >60)
Glucose, Bld: 107 mg/dL — ABNORMAL HIGH (ref 70–99)
Potassium: 2.9 mmol/L — ABNORMAL LOW (ref 3.5–5.1)
Sodium: 137 mmol/L (ref 135–145)

## 2023-08-11 LAB — CBC WITH DIFFERENTIAL/PLATELET
Abs Immature Granulocytes: 0.04 10*3/uL (ref 0.00–0.07)
Basophils Absolute: 0 10*3/uL (ref 0.0–0.1)
Basophils Relative: 0 %
Eosinophils Absolute: 0.1 10*3/uL (ref 0.0–0.5)
Eosinophils Relative: 1 %
HCT: 28 % — ABNORMAL LOW (ref 36.0–46.0)
Hemoglobin: 9.3 g/dL — ABNORMAL LOW (ref 12.0–15.0)
Immature Granulocytes: 1 %
Lymphocytes Relative: 7 %
Lymphs Abs: 0.6 10*3/uL — ABNORMAL LOW (ref 0.7–4.0)
MCH: 30.4 pg (ref 26.0–34.0)
MCHC: 33.2 g/dL (ref 30.0–36.0)
MCV: 91.5 fL (ref 80.0–100.0)
Monocytes Absolute: 0.7 10*3/uL (ref 0.1–1.0)
Monocytes Relative: 8 %
Neutro Abs: 7.1 10*3/uL (ref 1.7–7.7)
Neutrophils Relative %: 83 %
Platelets: 236 10*3/uL (ref 150–400)
RBC: 3.06 MIL/uL — ABNORMAL LOW (ref 3.87–5.11)
RDW: 14.5 % (ref 11.5–15.5)
WBC: 8.5 10*3/uL (ref 4.0–10.5)
nRBC: 0 % (ref 0.0–0.2)

## 2023-08-11 LAB — MAGNESIUM: Magnesium: 2 mg/dL (ref 1.7–2.4)

## 2023-08-11 LAB — PROTIME-INR
INR: 2.4 — ABNORMAL HIGH (ref 0.8–1.2)
Prothrombin Time: 26.2 s — ABNORMAL HIGH (ref 11.4–15.2)

## 2023-08-11 MED ORDER — POTASSIUM CHLORIDE CRYS ER 20 MEQ PO TBCR
40.0000 meq | EXTENDED_RELEASE_TABLET | ORAL | Status: AC
  Administered 2023-08-11 (×2): 40 meq via ORAL
  Filled 2023-08-11 (×2): qty 2

## 2023-08-11 MED ORDER — WARFARIN SODIUM 5 MG PO TABS
5.0000 mg | ORAL_TABLET | Freq: Once | ORAL | Status: AC
  Administered 2023-08-11: 5 mg via ORAL
  Filled 2023-08-11: qty 1

## 2023-08-11 MED ORDER — ONDANSETRON 4 MG PO TBDP
4.0000 mg | ORAL_TABLET | Freq: Three times a day (TID) | ORAL | Status: DC | PRN
  Administered 2023-08-11: 4 mg via ORAL

## 2023-08-11 NOTE — Progress Notes (Signed)
VAST RN to bedside for PIV start- MD and Primary RN at bedside as well. Plan will be to transition pt to PO medications at this time. Primary RN to place another consult should POC change.

## 2023-08-11 NOTE — Progress Notes (Addendum)
Progress Note    Nancy [name]  ZOX:[id number] DOB: 15-Jul-1961  DOA: 08/06/2023 PCP: Jacky Kindle, FNP      Brief Narrative:    Medical records reviewed and are as summarized below:  Nancy [name] is a 62 y.o. 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  She was discharged on Lovenox bridge 90 mg every 12 hours for 7 days along with warfarin 5 mg daily with goal INR of 2-3 as recommended by her CT surgeon.  She presented to the hospital with abdominal pain mainly in the left lower quadrant.  Abdominal pain started a day prior to admission.  She had been taking her Lovenox injection as prescribed.   She was found to have abdominal wall hematoma.      Assessment/Plan:   Principal Problem:   Abdominal wall hematoma Active Problems:   H/O mechanical aortic valve replacement   Adult hypothyroidism   Essential (primary) hypertension   Chronic obstructive pulmonary disease (HCC)   OSA (obstructive sleep apnea)   Acute blood loss anemia   Acute hypoxemic respiratory failure (HCC)   Toxic encephalopathy    Body mass index is 28.61 kg/m.   Abdominal wall hematoma measuring 7.2 x 5.3 x 9.6 cm, abdominal pain:  Repeat CT abdomen and pelvis done on 08/09/2023 showed enlarging left rectus abdominis hematoma. S/p left angiogram and successful combined coil and Gel-foam embolization of the left inferior epigastric artery Continue analgesics as needed  for pain.  Discontinue IV Dilaudid will use oxycodone and Tylenol as needed for pain.   Acute blood loss anemia: S/p transfusion with 1 unit of PRBCs on 08/09/2023.  H&H is stable.  Hemoglobin up from 8.9-9.3.  Monitor H&H closely and transfuse as needed.   Hypokalemia: Replete potassium and monitor levels.   S/p Saint Jude mechanical aortic valve replacement, on Coumadin coagulopathy: INR is down to 2.4.  S/p vitamin K 2.5 mg x 1 dose on 08/09/2023. Monitor INR closely and adjust warfarin accordingly. Goal INR is 2-3.   Bilateral basilar lung infiltrates on CT abdomen pelvis: Pneumonia is not suspected at this time.  She does not have any respiratory symptoms.  Procalcitonin and lactic acid levels were normal.  No growth on blood cultures thus far.   Dizziness on standing/ambulating: Improved after IV fluids. There was no evidence of orthostatic hypotension.   Acute hypoxemic respiratory failure and acute toxic encephalopathy on 08/08/2023: Resolved.  This was likely from frequent use of IV Dilaudid.   Hypertension: Continue antihypertensives   OSA: Continue 2 L/min oxygen nightly and oxygen as needed.   COPD: Stable.  Continue bronchodilators   Other comorbidities include hypothyroidism, s/p bronchoscopic lung volume reduction surgery with endobronchial valves to RML/RUL on 07/24/2023, chronic dissection of abdominal aorta   Patient lost her peripheral IV access.  She prefers not to have any peripheral IV at this time.  She is okay with oral analgesics.   Plan discussed with Amy, sister, at the bedside. Plan discussed with Donald Pore, RN, at the bedside.   Diet Order  Diet regular Room service appropriate? Yes; Fluid consistency: Thin  Diet effective now                            Consultants: Interventional radiologist  Procedures: None    Medications:    amLODipine  5 mg Oral Daily   doxycycline  20 mg Oral BID   DULoxetine  40 mg  Oral QHS   DULoxetine  60 mg Oral Daily   Fluticasone-Umeclidin-Vilant  1 Inhalation Inhalation Daily   ipratropium-albuterol  3 mL Nebulization TID   levothyroxine  125 mcg Oral Q0600   metoprolol succinate  25 mg Oral Daily   sodium chloride flush  3 mL Intravenous Q12H   warfarin  5 mg Oral ONCE-1600   Warfarin - Pharmacist Dosing Inpatient   Does not apply q1600   Continuous Infusions:  sodium chloride Stopped (08/10/23 1318)      Anti-infectives (From admission, onward)    Start     Dose/Rate Route Frequency Ordered Stop   08/06/23 2200  doxycycline (PERIOSTAT) tablet 20 mg        20 mg Oral 2 times daily 08/06/23 1909                Family Communication/Anticipated D/C date and plan/Code Status   DVT prophylaxis:  warfarin (COUMADIN) tablet 5 mg     Code Status: Full Code  Family Communication: Plan discussed with Amy, sister, at the bedside Disposition Plan: Plan to discharge home   Status is: Inpatient Remains inpatient appropriate because: Abdominal wall hematoma         Subjective:   Interval events noted.  Abdominal pain is better today.  Oral intake is poor.  She has some nausea but no vomiting or diarrhea.  No cough, shortness of breath, chest pain, wheezing or fever.    Objective:    Vitals:   08/10/23 2016 08/11/23 0416 08/11/23 0751 08/11/23 1334  BP:  (!) 131/55 (!) 133/53   Pulse:  (!) 103 96 (!) 105  Resp:  16 18 16   Temp:  97.9 F (36.6 C) (!) 97.5 F (36.4 C)   TempSrc:   Oral   SpO2: 95% 100% 98% 95%  Weight:      Height:       No data found.   Intake/Output Summary (Last 24 hours) at 08/11/2023 1351 Last data filed at 08/11/2023 0025 Gross per 24 hour  Intake 4.36 ml  Output --  Net 4.36 ml   Filed Weights   08/06/23 1313  Weight: 84.1 kg    Exam:  GEN: NAD SKIN: Warm and dry EYES: No pallor or icterus ENT: MMM CV: RRR PULM: Mild bibasilar rales.  No wheezing. ABD: soft, ND, LLQ tenderness is better,  +BS CNS: AAO x 3, non focal EXT: No edema or tenderness    Data Reviewed:   I have personally reviewed following labs and imaging studies:  Labs: Labs show the following:   Basic Metabolic Panel: Recent Labs  Lab 08/06/23 1251 08/09/23 0500 08/11/23 0356  NA 134* 136 137  K 3.4* 3.5 2.9*  CL 99 102 97*  CO2 24 26 28   GLUCOSE 103* 108* 107*  BUN 15 11 9   CREATININE 0.79 0.70 0.65  CALCIUM 8.8* 8.0* 8.4*  MG  --  2.0 2.0   GFR Estimated Creatinine Clearance: 84.2 mL/min (by C-G formula based on SCr of 0.65 mg/dL). Liver Function Tests: Recent Labs  Lab  08/06/23 1251  AST 30  ALT 49*  ALKPHOS 113  BILITOT 1.1  PROT 7.4  ALBUMIN 4.0   Recent Labs  Lab 08/06/23 1251  LIPASE 22   No results for input(s): "AMMONIA" in the last 168 hours. Coagulation profile Recent Labs  Lab 08/07/23 0427 08/08/23 0507 08/09/23 0500 08/10/23 0436 08/11/23 0356  INR 2.1* 3.3* 4.7* 2.5* 2.4*    CBC: Recent Labs  Lab 08/08/23 0507 08/09/23 0500 08/10/23 0436 08/10/23 1115 08/11/23 0356  WBC 17.3* 10.6* 13.0* 11.9* 8.5  NEUTROABS  --  8.7*  --  10.3* 7.1  HGB 9.7* 8.1* 9.2* 8.9* 9.3*  HCT 29.0* 24.0* 26.2* 25.5* 28.0*  MCV 91.5 91.3 87.6 87.9 91.5  PLT 263 211 225 220 236   Cardiac Enzymes: No results for input(s): "CKTOTAL", "CKMB", "CKMBINDEX", "TROPONINI" in the last 168 hours. BNP (last 3 results) No results for input(s): "PROBNP" in the last 8760 hours. CBG: No results for input(s): "GLUCAP" in the last 168 hours. D-Dimer: No results for input(s): "DDIMER" in the last 72 hours. Hgb A1c: No results for input(s): "HGBA1C" in the last 72 hours. Lipid Profile: No results for input(s): "CHOL", "HDL", "LDLCALC", "TRIG", "CHOLHDL", "LDLDIRECT" in the last 72 hours. Thyroid function studies: No results for input(s): "TSH", "T4TOTAL", "T3FREE", "THYROIDAB" in the last 72 hours.  Invalid input(s): "FREET3" Anemia work up: No results for input(s): "VITAMINB12",  "FOLATE", "FERRITIN", "TIBC", "IRON", "RETICCTPCT" in the last 72 hours. Sepsis Labs: Recent Labs  Lab 08/09/23 0500 08/10/23 0436 08/10/23 1115 08/11/23 0356  PROCALCITON  --   --  0.25  --   WBC 10.6* 13.0* 11.9* 8.5  LATICACIDVEN  --   --  0.8  --     Microbiology Recent Results (from the past 240 hour(s))  Culture, blood (Routine X 2) w Reflex to ID Panel     Status: None (Preliminary result)   Collection Time: 08/10/23 11:15 AM   Specimen: BLOOD  Result Value Ref Range Status   Specimen Description BLOOD LEFT ANTECUBITAL  Final   Special Requests   Final    BOTTLES DRAWN AEROBIC AND ANAEROBIC Blood Culture adequate volume   Culture   Final    NO GROWTH < 24 HOURS Performed at [facility], [address] [place], [place] [address]    Report Status PENDING  Incomplete  Culture, blood (Routine X 2) w Reflex to ID Panel     Status: None (Preliminary result)   Collection Time: 08/10/23 11:15 AM   Specimen: BLOOD  Result Value Ref Range Status   Specimen Description BLOOD RIGHT ANTECUBITAL  Final   Special Requests   Final    BOTTLES DRAWN AEROBIC AND ANAEROBIC Blood Culture adequate volume   Culture   Final    NO GROWTH < 24 HOURS Performed at [facility], [address], [place], [place] [address]    Report Status PENDING  Incomplete    Procedures and diagnostic studies:  IR EMBO ART  VEN HEMORR LYMPH EXTRAV  INC GUIDE ROADMAPPING  Result Date: 08/10/2023 INDICATION: 62 year old female on required anticoagulation in the setting of mechanical aortic valve presents with a spontaneous persistent bleed in the left rectus abdominus musculature. Despite conservative measures, she continues to have significant pain and decreasing hemoglobin. Therefore, she presents for arteriography and attempted embolization. EXAM: IR EMBO ART  VEN HEMORR LYMPH EXTRAV  INC GUIDE ROADMAPPING MEDICATIONS: None. ANESTHESIA/SEDATION: Moderate (conscious) sedation was  employed during this procedure. A total of Versed  1 mg and Fentanyl 50 mcg was administered intravenously by the Radiology nurse. Moderate Sedation Time: 35 minutes. The patient's level of consciousness and vital signs were monitored continuously by radiology nursing throughout the procedure under my direct supervision. CONTRAST:  70 mL Omnipaque 300 FLUOROSCOPY: Radiation Exposure Index (as provided by the fluoroscopic device): 1,003 mGy Kerma COMPLICATIONS: None immediate. PROCEDURE: Informed consent was obtained from the patient following explanation of the procedure, risks, benefits and alternatives. The patient understands, agrees and consents for the procedure. All questions were addressed. A time out was performed prior to the initiation of the procedure. Maximal barrier sterile technique utilized including caps, mask, sterile gowns, sterile gloves, large sterile drape, hand hygiene, and Betadine prep. The right common femoral artery was interrogated with ultrasound and found to be widely patent. An image was obtained and stored for the medical record. Local anesthesia was attained by infiltration with 1% lidocaine. A small dermatotomy was made. Under real-time sonographic guidance, the vessel was punctured with a 21 gauge micropuncture needle. Using standard technique, the initial micro needle was exchanged over a 0.018 micro wire for a transitional 4 [place] micro sheath. The micro sheath was then exchanged over a 0.035 wire for a 5 French vascular sheath. A C2 cobra catheter was introduced over a Bentson wire and then taken up in over the aortic bifurcation and into the left external iliac artery. A limited left lower extremity arteriogram was then performed. The origin of the inferior epigastric artery is identified. A Cook cantata 2.5 French microcatheter was then advanced over a Fathom 16 wire and used to select the inferior epigastric artery. Additional digital subtraction arteriography was performed.  There is an irregular branch in the proximal inferior epigastric artery heading medially which appears to terminate in a stump. This appears consistent with the site of expected bleeding on the prior CT imaging and likely represents an injured artery. The microcatheter was further used to select this unnamed branch artery. The decision was made to proceed with coil embolization given the need for continued anticoagulation and the clinical picture of persistent blood loss and pain. The small vessel was coil embolized using a 1 x 5 mm low-profile penumbra detachable coil. Follow-up arteriography demonstrates successful embolization. There are numerous additional small branches in the region. In order to prevent further bleeding the decision was made to proceed with gentle Gel-Foam embolization of the inferior epigastric artery. A Gel-Foam slurry was created. Approximately 2 cc of Gel-Foam was injected into the inferior epigastric artery pruning the peripheral branches. The catheter was then flushed. Repeat arteriography demonstrates successful embolization. The catheters were removed. Hemostasis was attained with the assistance of a Celt arterial closure device. IMPRESSION: Successful combined coil and Gel-Foam embolization of the left inferior epigastric artery and an abnormal unnamed medial branch. Electronically Signed   By: Malachy Moan M.D.   On: 08/10/2023 14:54   CT ABDOMEN PELVIS WO CONTRAST  Result Date: 08/09/2023 CLINICAL DATA:  Left abdominal wall hematoma. Left lower abdominal pain. History of aortic valve replacement and repair of the aortic dissection in 2010. EXAM: CT ABDOMEN AND PELVIS WITHOUT CONTRAST TECHNIQUE: Multidetector CT imaging of the abdomen and pelvis was performed following the standard protocol without IV contrast. RADIATION DOSE REDUCTION: This exam was performed according to the departmental dose-optimization program which includes automated exposure control, adjustment of the  mA and/or kV according to patient size and/or use of iterative reconstruction technique. COMPARISON:  None 03/31/2023 FINDINGS: Lower chest: Patchy infiltrates are suggested in  the lung bases, most likely multifocal pneumonia. Aspiration or edema could less likely have this appearance. Small left pleural effusion. Stent graft in the lower thoracic aorta. Hepatobiliary: Increased density in the gallbladder may represent milk of calcium, layering tiny stones, or sludge. No wall thickening or inflammatory stranding. No bile duct dilatation. Circumscribed low-attenuation lesion in the dome of the liver is unchanged since prior study consistent with a benign cyst. No imaging follow-up is indicated. Pancreas: Unremarkable. No pancreatic ductal dilatation or surrounding inflammatory changes. Spleen: Normal in size without focal abnormality. Adrenals/Urinary Tract: No adrenal gland nodules. Prominent right renal hydronephrosis with decompressed ureter likely representing ureteropelvic junction stenosis. This is unchanged since prior study and may be chronic. Two nonobstructing stones are demonstrated in the right kidney, largest measuring 3 mm diameter. No change. The left kidney, left ureter, and the bladder are unremarkable. Stomach/Bowel: Stomach, small bowel, and colon are not abnormally distended. No wall thickening or inflammatory changes. Appendix is not identified. Vascular/Lymphatic: There is evidence of chronic dissection of the aorta with displacement of intimal calcifications. This is better visualized on the prior study with IV contrast material. Lack of IV contrast material limits evaluation of vascular structures but there is no apparent change in the appearance of the aorta since the previous study. No significant lymphadenopathy. Reproductive: Status post hysterectomy. No adnexal masses. Other: No free air or free fluid in the abdomen. There is a hematoma in the left lower rectus abdominus muscles measuring  up to 7.5 x 8.1 cm in diameter. This is enlarging since the previous study. Infiltration in the anterior abdominal wall fat likely due to contusion. Cellulitis or edema could also have this appearance. Tiny subcutaneous fatty gas collections are demonstrated, likely representing sites of injection. Musculoskeletal: No acute bony abnormalities. IMPRESSION: 1. Left rectus abdominus hematoma is enlarging since prior study. Infiltration in the anterior abdominal wall subcutaneous fat likely representing contusion. 2. 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 3. Aortic atherosclerosis with changes consistent with chronic abdominal aortic dissection. This is better demonstrated on previous contrast-enhanced examination but no obvious changes identified. 4. Infiltrates in the lung bases most likely representing multifocal pneumonia. Small pleural effusions. 5. Nonobstructing intrarenal stones in the right kidney. 6. Milk of calcium versus small layering stones or sludge in the gallbladder. No acute inflammatory changes. Electronically Signed   By: Burman Nieves M.D.   On: 08/09/2023 20:58               LOS: 4 days   Lacosta Hargan  [facility]   Pager on [email or web address]. If 7PM-7AM, please contact night-coverage at [email or web address]     08/11/2023, 1:51 PM

## 2023-08-11 NOTE — Consult Note (Signed)
ANTICOAGULATION CONSULT NOTE  Pharmacy Consult for Warfarin  Indication: Mechanical Aortic Valve  Patient Measurements: Height: 5' 7.5" (171.5 cm) Weight: 84.1 kg (185 lb 6.5 oz) IBW/kg (Calculated) : 62.75  Vital Signs: Temp: 97.5 F (36.4 C) (09/30 0751) Temp Source: Oral (09/30 0751) BP: 133/53 (09/30 0751) Pulse Rate: 96 (09/30 0751)  Labs: Recent Labs    08/09/23 0500 08/10/23 0436 08/10/23 1115 08/11/23 0356  HGB 8.1* 9.2* 8.9* 9.3*  HCT 24.0* 26.2* 25.5* 28.0*  PLT 211 225 220 236  LABPROT 44.3* 27.1*  --  26.2*  INR 4.7* 2.5*  --  2.4*  CREATININE 0.70  --   --  0.65   Estimated Creatinine Clearance: 84.2 mL/min (by C-G formula based on SCr of 0.65 mg/dL).  Interacting Medications:  PTA Meds - Lovenox, levothyroxine, doxycycline  Assessment: Nancy [name] is a 62 year old female presenting with an abdominal wall hematoma with evidence of active bleeding. IR was consulted and plan is for conservative management at this time. Patient has history of bicuspid aortic valve plus an aortic dissection for which she underwent AVR and is currently on warfarin with an original goal INR of 2.5-3.5. Per fill history and outpatient records, home regimen is 5 mg daily.   DDIs: duloxetine, levothyroxine, doxycycline (all are chronic medications)  Goal of Therapy:  INR 2.0 - 3.0  Monitor platelets by anticoagulation protocol: Yes   Plan: INR is therapeutic today.   --Will give warfarin 5 mg x 1 tonight (Home regimen). Predict INR may continue to trend down.  Will aim for an INR between 2.5 to 3 due to AVR. Daily INR. CBC at least every 3 days.   Lowella Bandy, PharmD, BCPS 08/11/2023,10:24 AM

## 2023-08-11 NOTE — Plan of Care (Signed)

## 2023-08-12 DIAGNOSIS — S301XXA Contusion of abdominal wall, initial encounter: Secondary | ICD-10-CM | POA: Diagnosis not present

## 2023-08-12 LAB — CBC
HCT: 25.8 % — ABNORMAL LOW (ref 36.0–46.0)
Hemoglobin: 8.5 g/dL — ABNORMAL LOW (ref 12.0–15.0)
MCH: 30.1 pg (ref 26.0–34.0)
MCHC: 32.9 g/dL (ref 30.0–36.0)
MCV: 91.5 fL (ref 80.0–100.0)
Platelets: 227 10*3/uL (ref 150–400)
RBC: 2.82 MIL/uL — ABNORMAL LOW (ref 3.87–5.11)
RDW: 14.5 % (ref 11.5–15.5)
WBC: 5.9 10*3/uL (ref 4.0–10.5)
nRBC: 0 % (ref 0.0–0.2)

## 2023-08-12 LAB — BASIC METABOLIC PANEL
Anion gap: 7 (ref 5–15)
BUN: 8 mg/dL (ref 6–20)
CO2: 29 mmol/L (ref 22–32)
Calcium: 8.4 mg/dL — ABNORMAL LOW (ref 8.9–10.3)
Chloride: 100 mmol/L (ref 98–111)
Creatinine, Ser: 0.54 mg/dL (ref 0.44–1.00)
GFR, Estimated: >60 mL/min (ref >60)
Glucose, Bld: 103 mg/dL — ABNORMAL HIGH (ref 70–99)
Potassium: 4 mmol/L (ref 3.5–5.1)
Sodium: 136 mmol/L (ref 135–145)

## 2023-08-12 LAB — PROTIME-INR
INR: 3 — ABNORMAL HIGH (ref 0.8–1.2)
Prothrombin Time: 31.3 s — ABNORMAL HIGH (ref 11.4–15.2)

## 2023-08-12 MED ORDER — WARFARIN SODIUM 1 MG PO TABS
1.0000 mg | ORAL_TABLET | Freq: Once | ORAL | Status: AC
  Administered 2023-08-12: 1 mg via ORAL
  Filled 2023-08-12: qty 1

## 2023-08-12 MED ORDER — IPRATROPIUM-ALBUTEROL 0.5-2.5 (3) MG/3ML IN SOLN
3.0000 mL | Freq: Two times a day (BID) | RESPIRATORY_TRACT | Status: DC
  Administered 2023-08-13: 3 mL via RESPIRATORY_TRACT
  Filled 2023-08-12: qty 3

## 2023-08-12 MED ORDER — FERROUS SULFATE 325 (65 FE) MG PO TABS
325.0000 mg | ORAL_TABLET | Freq: Every day | ORAL | Status: DC
  Administered 2023-08-13: 325 mg via ORAL
  Filled 2023-08-12: qty 1

## 2023-08-12 NOTE — TOC Progression Note (Signed)
Transition of Care [facility]) - Progression Note    Patient Details  Name: BALEIGH RENNAKER MRN: [id number] Date of Birth: [birth year]  Transition of Care [facility]) CM/SW Contact  Darolyn Rua, [place] Phone Number: 08/12/2023, 9:45 AM  Clinical Narrative:     Transition of Care Department [facility]) has reviewed patient and no TOC needs have been identified at this time. We will continue to monitor patient advancement through interdisciplinary progression rounds. If new patient transition needs arise, please place a TOC consult.   Patient transitioning to PO meds no needs.        Expected Discharge Plan and Services                                               Social Determinants of Health (SDOH) Interventions SDOH Screenings   Food Insecurity: No Food Insecurity (08/06/2023)  Housing: Low Risk  (08/06/2023)  Transportation Needs: No Transportation Needs (08/06/2023)  Utilities: Not At Risk (08/06/2023)  Alcohol Screen: Low Risk  (01/28/2023)  Depression (PHQ2-9): Low Risk  (02/13/2023)  Financial Resource Strain: Low Risk  (07/22/2023)   Received from [facility]  Physical Activity: Inactive (01/02/2018)  Tobacco Use: Medium Risk (08/06/2023)   Received from [facility]    Readmission Risk Interventions     No data to display

## 2023-08-12 NOTE — Plan of Care (Signed)

## 2023-08-12 NOTE — Consult Note (Signed)
ANTICOAGULATION CONSULT NOTE  Pharmacy Consult for Warfarin  Indication: Mechanical Aortic Valve  Patient Measurements: Height: 5' 7.5" (171.5 cm) Weight: 84.1 kg (185 lb 6.5 oz) IBW/kg (Calculated) : 62.75  Vital Signs: Temp: 98.2 F (36.8 C) (10/01 0236) Temp Source: Oral (10/01 0236) BP: 128/68 (10/01 0236) Pulse Rate: 91 (10/01 0236)  Labs: Recent Labs    08/10/23 0436 08/10/23 1115 08/11/23 0356  HGB 9.2* 8.9* 9.3*  HCT 26.2* 25.5* 28.0*  PLT 225 220 236  LABPROT 27.1*  --  26.2*  INR 2.5*  --  2.4*  CREATININE  --   --  0.65   Estimated Creatinine Clearance: 84.2 mL/min (by C-G formula based on SCr of 0.65 mg/dL).  Interacting Medications:  PTA Meds - Lovenox, levothyroxine, doxycycline  Assessment: Nancy [name] is a 62 year old female presenting with an abdominal wall hematoma with evidence of active bleeding. IR was consulted and plan is for conservative management at this time. Patient has history of bicuspid aortic valve plus an aortic dissection for which she underwent AVR and is currently on warfarin with an original goal INR of 2.5-3.5. Per fill history and outpatient records, home regimen is 5 mg daily.   DDIs: duloxetine, levothyroxine, doxycycline (all are chronic medications)  Goal of Therapy:  INR 2.0 - 3.0  Monitor platelets by anticoagulation protocol: Yes   Plan: INR is therapeutic today but at the upper end of goal range.   --Will give warfarin 1 mg x 1 tonight   --Will aim for an INR between 2.5 to 3 due to AVR.  --Daily INR and CBC at least every 3 days.   Lowella Bandy, PharmD, BCPS 08/12/2023,7:12 AM

## 2023-08-12 NOTE — Progress Notes (Addendum)
Progress Note    CAROLLYNN PENNYWELL  WUJ:[id number] DOB: 20-Jan-1961  DOA: 08/06/2023 PCP: Jacky Kindle, FNP      Brief Narrative:    Medical records reviewed and are as summarized below:  [name] is a 62 y.o. 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  She was discharged on Lovenox bridge 90 mg every 12 hours for 7 days along with warfarin 5 mg daily with goal INR of 2-3 as recommended by her CT surgeon.  She presented to the hospital with abdominal pain mainly in the left lower quadrant.  Abdominal pain started a day prior to admission.  She had been taking her Lovenox injection as prescribed.   She was found to have abdominal wall hematoma.      Assessment/Plan:   Principal Problem:   Abdominal wall hematoma Active Problems:   H/O mechanical aortic valve replacement   Adult hypothyroidism   Essential (primary) hypertension   Chronic obstructive pulmonary disease (HCC)   OSA (obstructive sleep apnea)   Acute blood loss anemia   Acute hypoxemic respiratory failure (HCC)   Toxic encephalopathy    Body mass index is 28.61 kg/m.   Abdominal wall hematoma measuring 7.2 x 5.3 x 9.6 cm, abdominal pain:  Repeat CT abdomen and pelvis done on 08/09/2023 showed enlarging left rectus abdominis hematoma. S/p left angiogram and successful combined coil and Gel-foam embolization of the left inferior epigastric artery Continue analgesics as needed  for pain   Acute blood loss anemia: Hemoglobin dropped from 9.3-8.5.  No indication for blood transfusion at this time.  She is asymptomatic.  However, will repeat hemoglobin tomorrow to make sure H&H is stable prior to discharge.  S/p transfusion with 1 unit of PRBCs on 08/09/2023.   Start oral ferrous sulfate.   Hypokalemia: Improved   S/p Saint Jude mechanical aortic valve replacement, on Coumadin coagulopathy: INR is up to 3.  S/p vitamin K 2.5 mg x 1 dose on 08/09/2023. Monitor INR closely and adjust warfarin accordingly. Goal INR is 2-3.   Bilateral basilar lung infiltrates on CT abdomen pelvis: Pneumonia is not suspected at this time.  She does not have any respiratory symptoms.  Procalcitonin and lactic acid levels were normal.  No growth on blood cultures thus far.   Dizziness on standing/ambulating: Improved after IV fluids. There was no evidence of orthostatic hypotension.   Acute hypoxemic respiratory failure and acute toxic encephalopathy on 08/08/2023: Resolved.  This was likely from frequent use of IV Dilaudid.   Hypertension: Continue antihypertensives   OSA: Continue 2 L/min oxygen nightly and oxygen as needed.   COPD: Stable.  Continue bronchodilators   Other comorbidities include hypothyroidism, s/p bronchoscopic lung volume reduction surgery with endobronchial valves to RML/RUL on 07/24/2023, chronic dissection of abdominal aorta   Patient lost her peripheral IV access.  She prefers not to have any peripheral IV at this time.  She is okay with oral analgesics.     Diet Order  Diet regular Room service appropriate? Yes; Fluid consistency: Thin  Diet effective now                            Consultants: Interventional radiologist  Procedures: None    Medications:    amLODipine  5 mg Oral Daily   doxycycline  20 mg Oral BID   DULoxetine  40 mg Oral QHS   DULoxetine  60 mg Oral Daily   Fluticasone-Umeclidin-Vilant  1  Inhalation Inhalation Daily   ipratropium-albuterol  3 mL Nebulization TID   levothyroxine  125 mcg Oral Q0600   metoprolol succinate  25 mg Oral Daily   sodium chloride flush  3 mL Intravenous Q12H   Warfarin - Pharmacist Dosing Inpatient   Does not apply q1600   Continuous Infusions:  sodium chloride Stopped (08/10/23 1318)      Anti-infectives (From admission, onward)    Start     Dose/Rate Route Frequency Ordered Stop   08/06/23 2200  doxycycline (PERIOSTAT) tablet 20 mg        20 mg Oral 2 times daily 08/06/23 1909                Family Communication/Anticipated D/C date and plan/Code Status   DVT prophylaxis:      Code Status: Full Code  Family Communication: None Disposition Plan: Plan to discharge home   Status is: Inpatient Remains inpatient appropriate because: Abdominal wall hematoma         Subjective:   Interval events noted.  She still complains of left lower abdominal pain.  She said she was expecting to back to her baseline but she does not feel quite as well.  No cough, shortness of breath, chest pain, wheezing or fever.  Objective:    Vitals:   08/12/23 0236 08/12/23 0758 08/12/23 0823 08/12/23 1315  BP: 128/68  122/61   Pulse: 91 89 94 95  Resp: 18 20 17 16   Temp: 98.2 F (36.8 C)  97.7 F (36.5 C)   TempSrc: Oral  Oral   SpO2: 100% 96% 97% 90%  Weight:      Height:       No data found.   Intake/Output Summary (Last 24 hours) at 08/12/2023 1549 Last data filed at 08/11/2023 1900 Gross per 24 hour  Intake 120 ml  Output --  Net 120 ml   Filed Weights   08/06/23 1313  Weight: 84.1 kg    Exam:  GEN: NAD SKIN: Warm and dry EYES: No pallor or icterus ENT: MMM CV: RRR PULM: CTA B ABD: soft, ND, mild left LLQ tenderness, +BS CNS: AAO x 3, non focal EXT: No edema or tenderness     Data Reviewed:   I have personally reviewed following labs and imaging studies:  Labs: Labs show the following:   Basic  Metabolic Panel: Recent Labs  Lab 08/06/23 1251 08/09/23 0500 08/11/23 0356 08/12/23 0726  NA 134* 136 137 136  K 3.4* 3.5 2.9* 4.0  CL 99 102 97* 100  CO2 24 26 28 29   GLUCOSE 103* 108* 107* 103*  BUN 15 11 9 8   CREATININE 0.79 0.70 0.65 0.54  CALCIUM 8.8* 8.0* 8.4* 8.4*  MG  --  2.0 2.0  --    GFR Estimated Creatinine Clearance: 84.2 mL/min (by C-G formula based on SCr of 0.54 mg/dL). Liver Function Tests: Recent Labs  Lab 08/06/23 1251  AST 30  ALT 49*  ALKPHOS  113  BILITOT 1.1  PROT 7.4  ALBUMIN 4.0   Recent Labs  Lab 08/06/23 1251  LIPASE 22   No results for input(s): "AMMONIA" in the last 168 hours. Coagulation profile Recent Labs  Lab 08/08/23 0507 08/09/23 0500 08/10/23 0436 08/11/23 0356 08/12/23 0726  INR 3.3* 4.7* 2.5* 2.4* 3.0*    CBC: Recent Labs  Lab 08/09/23 0500 08/10/23 0436 08/10/23 1115 08/11/23 0356 08/12/23 0726  WBC 10.6* 13.0* 11.9* 8.5 5.9  NEUTROABS 8.7*  --  10.3* 7.1  --   HGB 8.1* 9.2* 8.9* 9.3* 8.5*  HCT 24.0* 26.2* 25.5* 28.0* 25.8*  MCV 91.3 87.6 87.9 91.5 91.5  PLT 211 225 220 236 227   Cardiac Enzymes: No results for input(s): "CKTOTAL", "CKMB", "CKMBINDEX", "TROPONINI" in the last 168 hours. BNP (last 3 results) No results for input(s): "PROBNP" in the last 8760 hours. CBG: No results for input(s): "GLUCAP" in the last 168 hours. D-Dimer: No results for input(s): "DDIMER" in the last 72 hours. Hgb A1c: No results for input(s): "HGBA1C" in the last 72 hours. Lipid Profile: No results for input(s): "CHOL", "HDL", "LDLCALC", "TRIG", "CHOLHDL", "LDLDIRECT" in the last 72 hours. Thyroid function studies: No results for input(s): "TSH", "T4TOTAL", "T3FREE", "THYROIDAB" in the last 72 hours.  Invalid input(s): "FREET3" Anemia work up: No results for input(s): "VITAMINB12", "FOLATE", "FERRITIN", "TIBC", "IRON", "RETICCTPCT" in the last 72 hours. Sepsis Labs: Recent Labs  Lab 08/10/23 0436 08/10/23 1115  08/11/23 0356 08/12/23 0726  PROCALCITON  --  0.25  --   --   WBC 13.0* 11.9* 8.5 5.9  LATICACIDVEN  --  0.8  --   --     Microbiology Recent Results (from the past 240 hour(s))  Culture, blood (Routine X 2) w Reflex to ID Panel     Status: None (Preliminary result)   Collection Time: 08/10/23 11:15 AM   Specimen: BLOOD  Result Value Ref Range Status   Specimen Description BLOOD LEFT ANTECUBITAL  Final   Special Requests   Final    BOTTLES DRAWN AEROBIC AND ANAEROBIC Blood Culture adequate volume   Culture   Final    NO GROWTH 2 DAYS Performed at [facility], [address], [place], [place] [address]    Report Status PENDING  Incomplete  Culture, blood (Routine X 2) w Reflex to ID Panel     Status: None (Preliminary result)   Collection Time: 08/10/23 11:15 AM   Specimen: BLOOD  Result Value Ref Range Status   Specimen Description BLOOD RIGHT ANTECUBITAL  Final   Special Requests   Final    BOTTLES DRAWN AEROBIC AND ANAEROBIC Blood Culture adequate volume   Culture   Final    NO GROWTH 2 DAYS Performed at [facility], [address] [place], [place] [address]    Report Status PENDING  Incomplete    Procedures and diagnostic studies:  No results found.             LOS: 5 days   Telicia Hodgkiss  [facility]   Pager on [email or web address]. If 7PM-7AM, please contact night-coverage at [email or web address]     08/12/2023, 3:49 PM

## 2023-08-13 DIAGNOSIS — S301XXA Contusion of abdominal wall, initial encounter: Secondary | ICD-10-CM | POA: Diagnosis not present

## 2023-08-13 LAB — CBC WITH DIFFERENTIAL/PLATELET
Abs Immature Granulocytes: 0.02 10*3/uL (ref 0.00–0.07)
Basophils Absolute: 0 10*3/uL (ref 0.0–0.1)
Basophils Relative: 0 %
Eosinophils Absolute: 0.2 10*3/uL (ref 0.0–0.5)
Eosinophils Relative: 4 %
HCT: 26.3 % — ABNORMAL LOW (ref 36.0–46.0)
Hemoglobin: 8.7 g/dL — ABNORMAL LOW (ref 12.0–15.0)
Immature Granulocytes: 0 %
Lymphocytes Relative: 11 %
Lymphs Abs: 0.6 10*3/uL — ABNORMAL LOW (ref 0.7–4.0)
MCH: 30.7 pg (ref 26.0–34.0)
MCHC: 33.1 g/dL (ref 30.0–36.0)
MCV: 92.9 fL (ref 80.0–100.0)
Monocytes Absolute: 0.6 10*3/uL (ref 0.1–1.0)
Monocytes Relative: 10 %
Neutro Abs: 4.3 10*3/uL (ref 1.7–7.7)
Neutrophils Relative %: 75 %
Platelets: 268 10*3/uL (ref 150–400)
RBC: 2.83 MIL/uL — ABNORMAL LOW (ref 3.87–5.11)
RDW: 14.7 % (ref 11.5–15.5)
WBC: 5.8 10*3/uL (ref 4.0–10.5)
nRBC: 0 % (ref 0.0–0.2)

## 2023-08-13 LAB — TYPE AND SCREEN
ABO/RH(D): O NEG
Antibody Screen: POSITIVE
Unit division: 0
Unit division: 0
Unit division: 0
Unit division: 0
Unit division: 0

## 2023-08-13 LAB — BPAM RBC
Blood Product Expiration Date: 202410082359
Blood Product Expiration Date: 202410082359
Blood Product Expiration Date: 202410102359
ISSUE DATE / TIME: 202409281400
Unit Type and Rh: 5100
Unit Type and Rh: 5100
Unit Type and Rh: 5100

## 2023-08-13 LAB — PROTIME-INR
INR: 3 — ABNORMAL HIGH (ref 0.8–1.2)
Prothrombin Time: 31 s — ABNORMAL HIGH (ref 11.4–15.2)

## 2023-08-13 MED ORDER — OXYCODONE HCL 5 MG PO TABS: 5.0000 mg | ORAL_TABLET | Freq: Four times a day (QID) | ORAL | 0 refills | Status: DC | PRN

## 2023-08-13 MED ORDER — FERROUS SULFATE 325 (65 FE) MG PO TABS: 325.0000 mg | ORAL_TABLET | Freq: Every day | ORAL | 0 refills | Status: DC

## 2023-08-13 MED ORDER — SENNOSIDES-DOCUSATE SODIUM 8.6-50 MG PO TABS: 1.0000 | ORAL_TABLET | Freq: Two times a day (BID) | ORAL | 0 refills | Status: AC | PRN

## 2023-08-13 NOTE — Discharge Summary (Signed)
Physician Discharge Summary  Nancy [name] WJX:[id number] DOB: 1961/08/18 DOA: 08/06/2023  PCP: Jacky Kindle, FNP  Admit date: 08/06/2023 Discharge date: 08/13/2023  Admitted From: Home Disposition:  Home  Recommendations for Outpatient Follow-up:  Follow up with PCP in 1-2 weeks   Home Health:No Equipment/Devices:None   Discharge Condition:Stable  CODE STATUS:FULL  Diet recommendation: Reg  Brief/Interim Summary:  Nancy [name] is a 62 y.o. 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  She was discharged on Lovenox bridge 90 mg every 12 hours for 7 days along with warfarin 5 mg daily with goal INR of 2-3 as recommended by her CT surgeon.  She presented to the hospital with abdominal pain mainly in the left lower quadrant.  Abdominal pain started a day prior to admission.  She had been taking her Lovenox injection as prescribed.     She was found to have abdominal wall hematoma.  Interventional radiology consulted.  Patient underwent successful embolization of bleeding vessel.  Tolerated procedure well.  Warfarin restarted and INR 3, at goal, at time of discharge.  Stable for DC home at this time.  Follow-up outpatient PCP and cardiology.    Discharge Diagnoses:  Principal Problem:   Abdominal wall hematoma Active Problems:   H/O mechanical aortic valve replacement   Adult hypothyroidism   Essential (primary) hypertension   Chronic obstructive pulmonary  disease (HCC)   OSA (obstructive sleep apnea)   Acute blood loss anemia   Acute hypoxemic respiratory failure (HCC)   Toxic encephalopathy Abdominal wall hematoma measuring 7.2 x 5.3 x 9.6 cm, abdominal pain:  Repeat CT abdomen and pelvis done on 08/09/2023 showed enlarging left rectus abdominis hematoma. S/p left angiogram and successful combined coil and Gel-foam embolization of the left inferior epigastric artery Hemoglobin stable.  P.o. analgesics for pain.  Stable for discharge     Acute blood loss anemia: Hemoglobin 817 at time of discharge.  Will DC home with p.o. iron supplementation.  Follow-up outpatient PCP for repeat lab work.     Hypokalemia: Improved     S/p Saint Jude mechanical aortic valve replacement, on Coumadin coagulopathy: INR is up to 3.  S/p vitamin K 2.5 mg x 1 dose on 08/09/2023. Monitor INR closely and adjust warfarin accordingly. Goal INR is 2-3.  INR 3 time of discharge.  Can resume home regimen.  Follow-up outpatient   Discharge Instructions  Discharge Instructions     Diet - low sodium heart healthy   Complete by: As directed    Increase activity slowly   Complete by: As directed    No wound care   Complete by: As directed       Allergies as of 08/13/2023       Reactions   Codeine Nausea Only   Can take cough syrup.        Medication List     TAKE these medications    albuterol 108 (90 Base) MCG/ACT inhaler Commonly known as: VENTOLIN HFA SMARTSIG:2 Inhalation Via Inhaler Every 6 Hours PRN   amLODipine 5 MG tablet  Commonly known as: NORVASC Take 5 mg by mouth daily.   aspirin EC 81 MG tablet Take by mouth.   doxycycline 20 MG tablet Commonly known as: PERIOSTAT TAKE 1 TABLET BY MOUTH TWICE DAILY   DULoxetine 20 MG capsule Commonly known as: CYMBALTA Take 2 capsules (40 mg total) by mouth 2 (two) times daily. What changed:  how much to take additional instructions   enoxaparin 100 MG/ML injection Commonly known as:  LOVENOX Inject into the skin.   ferrous sulfate 325 (65 FE) MG tablet Take 1 tablet (325 mg total) by mouth daily with breakfast.   furosemide 40 MG tablet Commonly known as: LASIX Take 40 mg by mouth 2 (two) times daily.   ipratropium-albuterol 0.5-2.5 (3) MG/3ML Soln Commonly known as: DUONEB Inhale into the lungs.   levothyroxine 125 MCG tablet Commonly known as: SYNTHROID Take 1 tablet (125 mcg total) by mouth daily before breakfast.   metoprolol succinate 25 MG 24 hr tablet Commonly known as: TOPROL-XL Take 25 mg by mouth daily.   ondansetron 4 MG disintegrating tablet Commonly known as: Zofran ODT Take 1 tablet (4 mg total) by mouth every 8 (eight) hours as needed for nausea or vomiting.   oxyCODONE 5 MG immediate release tablet Commonly known as: Oxy IR/ROXICODONE Take 1 tablet (5 mg total) by mouth every 6 (six) hours as needed for moderate pain.   senna-docusate 8.6-50 MG tablet Commonly known as: Senokot-S Take 1 tablet by mouth 2 (two) times daily as needed for mild constipation.   Trelegy Ellipta 100-62.5-25 MCG/ACT Aepb Generic drug: Fluticasone-Umeclidin-Vilant Inhale into the lungs.   valACYclovir 1000 MG tablet Commonly known as: VALTREX   warfarin 5 MG tablet Commonly known as: COUMADIN Take as directed. If you are unsure how to take this medication, talk to your nurse or doctor. Original instructions: Take 5 mg by mouth every evening. Per cardiologist: patient supposed to take 7.5 mg 9/24 and 9/25 evening for low INR then resume 5 mg nightly  Goal INR 2.5-3.5        Allergies  Allergen Reactions   Codeine Nausea Only    Can take cough syrup.    Consultations: Interventional radiology   Procedures/Studies: IR EMBO ART  VEN HEMORR LYMPH EXTRAV  INC GUIDE ROADMAPPING  Result Date: 08/10/2023 INDICATION: 62 year old female on required anticoagulation in the setting of mechanical aortic valve presents with a spontaneous persistent bleed in  the left rectus abdominus musculature. Despite conservative measures, she continues to have significant pain and decreasing hemoglobin. Therefore, she presents for arteriography and attempted embolization. EXAM: IR EMBO ART  VEN HEMORR LYMPH EXTRAV  INC GUIDE ROADMAPPING MEDICATIONS: None. ANESTHESIA/SEDATION: Moderate (conscious) sedation was employed during this procedure. A total of Versed 1 mg and Fentanyl 50 mcg was administered intravenously by the Radiology nurse. Moderate Sedation Time: 35 minutes. The patient's level of consciousness and vital signs were monitored continuously by radiology nursing throughout the procedure under my direct supervision. CONTRAST:  70 mL Omnipaque 300 FLUOROSCOPY: Radiation Exposure Index (as provided by the fluoroscopic device): 1,003 mGy Kerma COMPLICATIONS: None immediate. PROCEDURE: Informed consent was obtained from the patient following explanation of the procedure, risks, benefits and alternatives. The patient understands, agrees and consents for the procedure. All questions were addressed. A time out was performed prior to the initiation of the procedure. Maximal barrier sterile technique utilized including caps, mask, sterile gowns, sterile gloves, large sterile drape, hand hygiene, and Betadine prep. The right common femoral artery was interrogated with ultrasound  and found to be widely patent. An image was obtained and stored for the medical record. Local anesthesia was attained by infiltration with 1% lidocaine. A small dermatotomy was made. Under real-time sonographic guidance, the vessel was punctured with a 21 gauge micropuncture needle. Using standard technique, the initial micro needle was exchanged over a 0.018 micro wire for a transitional 4 [place] micro sheath. The micro sheath was then exchanged over a 0.035 wire for a 5 French vascular sheath. A C2 cobra catheter was introduced over a Bentson wire and then taken up in over the aortic bifurcation and into  the left external iliac artery. A limited left lower extremity arteriogram was then performed. The origin of the inferior epigastric artery is identified. A Cook cantata 2.5 French microcatheter was then advanced over a Fathom 16 wire and used to select the inferior epigastric artery. Additional digital subtraction arteriography was performed. There is an irregular branch in the proximal inferior epigastric artery heading medially which appears to terminate in a stump. This appears consistent with the site of expected bleeding on the prior CT imaging and likely represents an injured artery. The microcatheter was further used to select this unnamed branch artery. The decision was made to proceed with coil embolization given the need for continued anticoagulation and the clinical picture of persistent blood loss and pain. The small vessel was coil embolized using a 1 x 5 mm low-profile penumbra detachable coil. Follow-up arteriography demonstrates successful embolization. There are numerous additional small branches in the region. In order to prevent further bleeding the decision was made to proceed with gentle Gel-Foam embolization of the inferior epigastric artery. A Gel-Foam slurry was created. Approximately 2 cc of Gel-Foam was injected into the inferior epigastric artery pruning the peripheral branches. The catheter was then flushed. Repeat arteriography demonstrates successful embolization. The catheters were removed. Hemostasis was attained with the assistance of a Celt arterial closure device. IMPRESSION: Successful combined coil and Gel-Foam embolization of the left inferior epigastric artery and an abnormal unnamed medial branch. Electronically Signed   By: Malachy Moan M.D.   On: 08/10/2023 14:54   CT ABDOMEN PELVIS WO CONTRAST  Result Date: 08/09/2023 CLINICAL DATA:  Left abdominal wall hematoma. Left lower abdominal pain. History of aortic valve replacement and repair of the aortic dissection in  2010. EXAM: CT ABDOMEN AND PELVIS WITHOUT CONTRAST TECHNIQUE: Multidetector CT imaging of the abdomen and pelvis was performed following the standard protocol without IV contrast. RADIATION DOSE REDUCTION: This exam was performed according to the departmental dose-optimization program which includes automated exposure control, adjustment of the mA and/or kV according to patient size and/or use of iterative reconstruction technique. COMPARISON:  None 03/31/2023 FINDINGS: Lower chest: Patchy infiltrates are suggested in the lung bases, most likely multifocal pneumonia. Aspiration or edema could less likely have this appearance. Small left pleural effusion. Stent graft in the lower thoracic aorta. Hepatobiliary: Increased density in the gallbladder may represent milk of calcium, layering tiny stones, or sludge. No wall thickening or inflammatory stranding. No bile duct dilatation. Circumscribed low-attenuation lesion in the dome of the liver is unchanged since prior study consistent with a benign cyst. No imaging follow-up is indicated. Pancreas: Unremarkable. No pancreatic ductal dilatation or surrounding inflammatory changes. Spleen: Normal in size without focal abnormality. Adrenals/Urinary Tract: No adrenal gland nodules. Prominent right renal hydronephrosis with decompressed ureter likely representing ureteropelvic junction stenosis. This is unchanged since prior study and may be chronic. Two nonobstructing stones are demonstrated in the right kidney, largest  measuring 3 mm diameter. No change. The left kidney, left ureter, and the bladder are unremarkable. Stomach/Bowel: Stomach, small bowel, and colon are not abnormally distended. No wall thickening or inflammatory changes. Appendix is not identified. Vascular/Lymphatic: There is evidence of chronic dissection of the aorta with displacement of intimal calcifications. This is better visualized on the prior study with IV contrast material. Lack of IV contrast  material limits evaluation of vascular structures but there is no apparent change in the appearance of the aorta since the previous study. No significant lymphadenopathy. Reproductive: Status post hysterectomy. No adnexal masses. Other: No free air or free fluid in the abdomen. There is a hematoma in the left lower rectus abdominus muscles measuring up to 7.5 x 8.1 cm in diameter. This is enlarging since the previous study. Infiltration in the anterior abdominal wall fat likely due to contusion. Cellulitis or edema could also have this appearance. Tiny subcutaneous fatty gas collections are demonstrated, likely representing sites of injection. Musculoskeletal: No acute bony abnormalities. IMPRESSION: 1. Left rectus abdominus hematoma is enlarging since prior study. Infiltration in the anterior abdominal wall subcutaneous fat likely representing contusion. 2. 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 3. Aortic atherosclerosis with changes consistent with chronic abdominal aortic dissection. This is better demonstrated on previous contrast-enhanced examination but no obvious changes identified. 4. Infiltrates in the lung bases most likely representing multifocal pneumonia. Small pleural effusions. 5. Nonobstructing intrarenal stones in the right kidney. 6. Milk of calcium versus small layering stones or sludge in the gallbladder. No acute inflammatory changes. Electronically Signed   By: Burman Nieves M.D.   On: 08/09/2023 20:58   CT ABDOMEN PELVIS W CONTRAST  Result Date: 08/06/2023 CLINICAL DATA:  Abdominal pain in the left lower quadrant. Pain started yesterday is worsening today. EXAM: CT ABDOMEN AND PELVIS WITH CONTRAST TECHNIQUE: Multidetector CT imaging of the abdomen and pelvis was performed using the standard protocol following bolus administration of intravenous contrast. RADIATION DOSE REDUCTION: This exam was performed according to the  departmental dose-optimization program which includes automated exposure control, adjustment of the mA and/or kV according to patient size and/or use of iterative reconstruction technique. CONTRAST:  OMNIPAQUE IOHEXOL 300 MG/ML  SOLN COMPARISON:  09/24/2006. [facility] CT from 10/16/2015 is available. FINDINGS: Lower chest: Mild cylindrical bronchiectasis noted in the lung bases with pleuroparenchymal scarring in the lower lungs bilaterally. Hepatobiliary: 16 mm low-density lesion in the left liver is stable compatible with benign etiology such as a cyst. No followup imaging is recommended. Gallbladder is distended with possible tiny dependent stone towards the neck (23/2). No intrahepatic or extrahepatic biliary dilation. Pancreas: No focal mass lesion. No dilatation of the main duct. No intraparenchymal cyst. No peripancreatic edema. Spleen: No splenomegaly. No suspicious focal mass lesion. Adrenals/Urinary Tract: No adrenal nodule or mass. Marked right-sided hydronephrosis is stable in the interval without evidence for hydroureter, features suggesting chronic UPJ obstruction. Thinning of the overlying cortex is consistent with a chronic process. 3 mm nonobstructing interpolar stone seen in the right kidney on 32/2. 15 mm low-density well-defined homogeneous subcapsular lesion in the lower pole left kidney has attenuation higher than would be expected for a simple cyst. This lesion was present on the 2016 exam measuring 5 mm at that time. Left ureter unremarkable. The urinary bladder appears normal for the degree of distention. Stomach/Bowel: Stomach is unremarkable. No gastric wall thickening. No evidence of outlet obstruction. Duodenum is normally positioned as is the ligament of  Treitz. No small bowel wall thickening. No small bowel dilatation. The terminal ileum is normal. The appendix is not well visualized, but there is no edema or inflammation in the region of the cecal tip to  suggest appendicitis. No gross colonic mass. No colonic wall thickening. A few scattered diverticuli are seen in the left colon without diverticulitis. Vascular/Lymphatic: Inferior aspect of an aortic stent graft noted in the lower thoracic aorta extending to about the level of the diaphragmatic hiatus. Distal to the stent is a chronic dissection of the abdominal aorta with dissection flap extending into the bifurcation and both common iliac arteries as before. Flap is visible extending into the left external iliac artery as on the prior study. There is no gastrohepatic or hepatoduodenal ligament lymphadenopathy. No retroperitoneal or mesenteric lymphadenopathy. No pelvic sidewall lymphadenopathy. Reproductive: Hysterectomy.  There is no adnexal mass. Other: No intraperitoneal free fluid. Musculoskeletal: A left rectus sheath hematoma measures on the order of 7.2 x 4.3 x 9.6 cm. Layering high attenuation material within the hematoma is compatible with contrast extravasation consistent with active/ongoing bleeding. There is edema and hemorrhage in the extraperitoneal soft tissues of the anterior left pelvic floor and around the left rectus sheath. No worrisome lytic or sclerotic osseous abnormality. IMPRESSION: 1. 7.2 x 4.3 x 9.6 cm left rectus sheath hematoma with evidence of active/ongoing bleeding. 2. 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 Imaging appearance not substantially changed since 10/16/2015 exam. 3. 15 mm low-density well-defined homogeneous subcapsular lesion in the lower pole left kidney has attenuation higher than would be expected for a simple cyst. This lesion was present on the 2016 exam measuring 5 mm at that time. Slow progression over such a long time frame this reassuring for benign etiology such as proteinaceous or hemorrhagic cyst. Follow-up outpatient nonemergent MRI abdomen with and without contrast could be used to confirm  as clinically warranted. 4. Chronic dissection of the abdominal aorta with dissection flap extending into the bifurcation and both common iliac arteries as before. Flap is visible extending into the left external iliac artery as on the prior study. 5. Mild cylindrical bronchiectasis in the lung bases with pleuroparenchymal scarring in the lower lungs bilaterally. 6.  Aortic Atherosclerosis (ICD10-I70.0). I personally called these results to the emergency department at 1600 hours. Dr. Arnoldo Morale, the ordering physician was no longer on duty and Dr. Roxan Hockey was unavailable due to caring for another patient. As such, I relayed these findings to the patient's nurse who will communicate them to Dr. Roxan Hockey. Electronically Signed   By: Kennith Center M.D.   On: 08/06/2023 16:01      Subjective: Seen and examined on the day of discharge.  Stable no distress.  Appropriate for discharge home.  Discharge Exam: Vitals:   08/13/23 0733 08/13/23 0913  BP:  (!) 111/52  Pulse: 93 (!) 108  Resp: 16 18  Temp:    SpO2: 93% 96%   Vitals:   08/12/23 1931 08/13/23 0358 08/13/23 0733 08/13/23 0913  BP: 101/61 117/61  (!) 111/52  Pulse: 94 95 93 (!) 108  Resp: 18  16 18   Temp: 98.8 F (37.1 C) 98 F (36.7 C)    TempSrc: Oral Oral    SpO2: 99% 100% 93% 96%  Weight:      Height:        General: Pt is alert, awake, not in acute distress Cardiovascular: RRR, S1/S2 +, no rubs, no gallops Respiratory: CTA bilaterally, no  wheezing, no rhonchi Abdominal: Soft, NT, ND, bowel sounds +, abdominal wall ecchymosis Extremities: no edema, no cyanosis    The results of significant diagnostics from this hospitalization (including imaging, microbiology, ancillary and laboratory) are listed below for reference.     Microbiology: Recent Results (from the past 240 hour(s))  Culture, blood (Routine X 2) w Reflex to ID Panel     Status: None (Preliminary result)   Collection Time: 08/10/23 11:15 AM   Specimen: BLOOD   Result Value Ref Range Status   Specimen Description BLOOD LEFT ANTECUBITAL  Final   Special Requests   Final    BOTTLES DRAWN AEROBIC AND ANAEROBIC Blood Culture adequate volume   Culture   Final    NO GROWTH 3 DAYS Performed at [facility], [address] [place], [place] [address]    Report Status PENDING  Incomplete  Culture, blood (Routine X 2) w Reflex to ID Panel     Status: None (Preliminary result)   Collection Time: 08/10/23 11:15 AM   Specimen: BLOOD  Result Value Ref Range Status   Specimen Description BLOOD RIGHT ANTECUBITAL  Final   Special Requests   Final    BOTTLES DRAWN AEROBIC AND ANAEROBIC Blood Culture adequate volume   Culture   Final    NO GROWTH 3 DAYS Performed at [facility], [address], [place], [place] [address]    Report Status PENDING  Incomplete     Labs: BNP (last 3 results) No results for input(s): "BNP" in the last 8760 hours. Basic Metabolic Panel: Recent Labs  Lab 08/09/23 0500 08/11/23 0356 08/12/23 0726  NA 136 137 136  K 3.5 2.9* 4.0  CL 102 97* 100  CO2 26 28 29   GLUCOSE 108* 107* 103*  BUN 11 9 8   CREATININE 0.70 0.65 0.54  CALCIUM 8.0* 8.4* 8.4*  MG 2.0 2.0  --    Liver Function Tests: No results for input(s): "AST", "ALT", "ALKPHOS", "BILITOT", "PROT", "ALBUMIN" in the last 168 hours. No results for input(s): "LIPASE", "AMYLASE" in the last 168 hours. No results for input(s): "AMMONIA" in the last 168 hours. CBC: Recent Labs  Lab 08/09/23 0500 08/10/23 0436 08/10/23 1115 08/11/23 0356 08/12/23 0726 08/13/23 0332  WBC 10.6* 13.0* 11.9* 8.5 5.9 5.8  NEUTROABS 8.7*  --  10.3* 7.1  --  4.3  HGB 8.1* 9.2* 8.9* 9.3* 8.5* 8.7*  HCT 24.0* 26.2* 25.5* 28.0* 25.8* 26.3*  MCV 91.3 87.6 87.9 91.5 91.5 92.9  PLT 211 225 220 236 227 268   Cardiac Enzymes: No results for input(s): "CKTOTAL", "CKMB", "CKMBINDEX", "TROPONINI" in the last 168 hours. BNP: Invalid input(s): "POCBNP" CBG: No results  for input(s): "GLUCAP" in the last 168 hours. D-Dimer No results for input(s): "DDIMER" in the last 72 hours. Hgb A1c No results for input(s): "HGBA1C" in the last 72 hours. Lipid Profile No results for input(s): "CHOL", "HDL", "LDLCALC", "TRIG", "CHOLHDL", "LDLDIRECT" in the last 72 hours. Thyroid function studies No results for input(s): "TSH", "T4TOTAL", "T3FREE", "THYROIDAB" in the last 72 hours.  Invalid input(s): "FREET3" Anemia work up No results for input(s): "VITAMINB12", "FOLATE", "FERRITIN", "TIBC", "IRON", "RETICCTPCT" in the last 72 hours. Urinalysis    Component Value Date/Time   COLORURINE YELLOW (A) 08/06/2023 1251   APPEARANCEUR CLEAR (A) 08/06/2023 1251   APPEARANCEUR Clear 01/22/2018 1540   LABSPEC 1.010 08/06/2023 1251   PHURINE 5.0 08/06/2023 1251   GLUCOSEU NEGATIVE 08/06/2023 1251   HGBUR SMALL (A) 08/06/2023 1251  BILIRUBINUR NEGATIVE 08/06/2023 1251   BILIRUBINUR Negative 01/22/2018 1540   KETONESUR NEGATIVE 08/06/2023 1251   PROTEINUR NEGATIVE 08/06/2023 1251   UROBILINOGEN 0.2 05/07/2016 1603   NITRITE NEGATIVE 08/06/2023 1251   LEUKOCYTESUR NEGATIVE 08/06/2023 1251   Sepsis Labs Recent Labs  Lab 08/10/23 1115 08/11/23 0356 08/12/23 0726 08/13/23 0332  WBC 11.9* 8.5 5.9 5.8   Microbiology Recent Results (from the past 240 hour(s))  Culture, blood (Routine X 2) w Reflex to ID Panel     Status: None (Preliminary result)   Collection Time: 08/10/23 11:15 AM   Specimen: BLOOD  Result Value Ref Range Status   Specimen Description BLOOD LEFT ANTECUBITAL  Final   Special Requests   Final    BOTTLES DRAWN AEROBIC AND ANAEROBIC Blood Culture adequate volume   Culture   Final    NO GROWTH 3 DAYS Performed at [facility], [address] [place], [place] [address]    Report Status PENDING  Incomplete  Culture, blood (Routine X 2) w Reflex to ID Panel     Status: None (Preliminary result)   Collection Time: 08/10/23 11:15 AM    Specimen: BLOOD  Result Value Ref Range Status   Specimen Description BLOOD RIGHT ANTECUBITAL  Final   Special Requests   Final    BOTTLES DRAWN AEROBIC AND ANAEROBIC Blood Culture adequate volume   Culture   Final    NO GROWTH 3 DAYS Performed at [facility], [address] [place], [place] [address]    Report Status PENDING  Incomplete     Time coordinating discharge: Over 30 minutes  SIGNED:   Tresa Moore, MD  [facility] 08/13/2023, 2:52 PM Pager   If 7PM-7AM, please contact night-coverage

## 2023-08-14 ENCOUNTER — Telehealth: Payer: Self-pay

## 2023-08-14 NOTE — Transitions of Care (Post Inpatient/ED Visit) (Signed)
08/14/2023  Name: Nancy [name] MRN: [id number] DOB: Feb 14, 1961  Today's TOC FU Call Status: Today's TOC FU Call Status:: Successful TOC FU Call Completed TOC FU Call Complete Date: 08/14/23 Patient's Name and Date of Birth confirmed.  Transition Care Management Follow-up Telephone Call Date of Discharge: 08/13/23 Discharge Facility: [facility]) Type of Discharge: Inpatient Admission Primary Inpatient Discharge Diagnosis:: LLQ pain How have you been since you were released from the hospital?: Better Any questions or concerns?: No  Items Reviewed: Did you receive and understand the discharge instructions provided?: Yes Medications obtained,verified, and reconciled?: Yes (Medications Reviewed) Any new allergies since your discharge?: No Dietary orders reviewed?: Yes Do you have support at home?: Yes People in Home: sibling(s)  Medications Reviewed Today: Medications Reviewed Today     Reviewed by Karena Addison, LPN (Licensed Practical Nurse) on 08/14/23 at 0911  Med List Status: <None>   Medication Order Taking? Sig Documenting Provider Last Dose Status Informant  albuterol (VENTOLIN HFA) 108 (90 Base) MCG/ACT inhaler [id number] No SMARTSIG:2 Inhalation Via Inhaler Every 6 Hours PRN [provider] Past Week Active   amLODipine (NORVASC) 5 MG tablet [id number] No Take 5 mg by mouth daily. [provider] 08/06/2023 Active Self           Med Note Worthy Rancher, CHASIE F   Fri Dec 15, 2020  3:32 PM)    aspirin EC 81 MG tablet [id number] No Take by mouth. [provider] 08/06/2023 Active   doxycycline (PERIOSTAT) 20 MG tablet [id number] No TAKE 1 TABLET BY MOUTH TWICE DAILY Deirdre Evener, MD Past Week Active   DULoxetine (CYMBALTA) 20 MG capsule [id number]  Take 2 capsules (40 mg total) by mouth 2 (two) times daily.  Patient taking differently: Take 40-60 mg by mouth 2 (two) times daily. 60 mg am, 40 mg hs   Merita Norton T, FNP   Active Self  enoxaparin (LOVENOX) 100 MG/ML injection [id number]  Inject into the skin. [provider]  Active   ferrous sulfate 325 (65 FE) MG tablet [id number]  Take 1 tablet (325 mg total) by mouth daily with breakfast. Lolita Patella B, MD  Active   furosemide (LASIX) 40 MG tablet [id number] No Take 40 mg by mouth 2 (two) times daily. [provider] 08/06/2023 Active Self           Med Note Worthy Rancher, CHASIE F   Fri Dec 15, 2020  3:33 PM)    ipratropium-albuterol (DUONEB) 0.5-2.5 (3) MG/3ML nebulizer solution 3 mL [id number]   Trey Sailors, PA-C  Active   ipratropium-albuterol (DUONEB) 0.5-2.5 (3) MG/3ML SOLN [id number] No Inhale into the lungs. [provider] Past Month Active   levothyroxine (SYNTHROID) 125 MCG tablet [id number] No Take 1 tablet (125 mcg total) by mouth daily before breakfast. Jacky Kindle, FNP 08/06/2023 Active   metoprolol succinate (TOPROL-XL) 25 MG 24 hr tablet [id number] No Take 25 mg by mouth daily. [provider] 08/06/2023 Active Self  ondansetron (ZOFRAN ODT) 4 MG disintegrating tablet [id number] No Take 1 tablet (4 mg total) by mouth every 8 (eight) hours as needed for nausea or vomiting. Jacky Kindle, FNP Past Week Active   oxyCODONE (OXY IR/ROXICODONE) 5 MG immediate release tablet [id number]  Take 1 tablet (5 mg total) by mouth every 6 (six) hours as needed for moderate pain. Tresa Moore, MD  Active   senna-docusate (SENOKOT-S) 8.6-50 MG tablet [id number]  Take 1 tablet by mouth 2 (two) times  daily as needed for mild constipation. Tresa Moore, MD  Active   TRELEGY ELLIPTA 100-62.5-25 MCG/ACT AEPB [id number]  Inhale into the lungs. [provider]  Active   valACYclovir (VALTREX) 1000 MG tablet [id number] No  [provider] Taking Active   warfarin (COUMADIN) 5 MG tablet [id number] No Take 5 mg by mouth every evening. Per cardiologist: patient supposed to take 7.5 mg 9/24 and 9/25 evening for low  INR then resume 5 mg nightly  Goal INR 2.5-3.5 [provider] 08/05/2023 Active Self           Med Note Worthy Rancher, CHASIE F   Fri Dec 15, 2020  3:33 PM)              Home Care and Equipment/Supplies: Were Home Health Services Ordered?: NA Any new equipment or medical supplies ordered?: NA  Functional Questionnaire: Do you need assistance with bathing/showering or dressing?: No Do you need assistance with meal preparation?: No Do you need assistance with eating?: No Do you have difficulty maintaining continence: No Do you need assistance with getting out of bed/getting out of a chair/moving?: No Do you have difficulty managing or taking your medications?: No  Follow up appointments reviewed: PCP Follow-up appointment confirmed?: Yes Date of PCP follow-up appointment?: 08/20/23 Follow-up Provider: [facility] Follow-up appointment confirmed?: NA Do you need transportation to your follow-up appointment?: No Do you understand care options if your condition(s) worsen?: Yes-patient verbalized understanding    SIGNATURE Karena Addison, LPN [facility] Nurse Health Advisor Direct Dial [phone]

## 2023-08-15 LAB — CULTURE, BLOOD (ROUTINE X 2)
Culture: NO GROWTH
Culture: NO GROWTH
Special Requests: ADEQUATE
Special Requests: ADEQUATE

## 2023-08-20 ENCOUNTER — Ambulatory Visit (INDEPENDENT_AMBULATORY_CARE_PROVIDER_SITE_OTHER): Payer: 59 | Admitting: Family Medicine

## 2023-08-20 ENCOUNTER — Encounter: Payer: Self-pay | Admitting: Family Medicine

## 2023-08-20 VITALS — BP 82/58 | HR 86 | Ht 67.5 in | Wt 194.2 lb

## 2023-08-20 DIAGNOSIS — Z09 Encounter for follow-up examination after completed treatment for conditions other than malignant neoplasm: Secondary | ICD-10-CM | POA: Diagnosis not present

## 2023-08-20 DIAGNOSIS — E038 Other specified hypothyroidism: Secondary | ICD-10-CM

## 2023-08-20 DIAGNOSIS — G4733 Obstructive sleep apnea (adult) (pediatric): Secondary | ICD-10-CM

## 2023-08-20 DIAGNOSIS — I671 Cerebral aneurysm, nonruptured: Secondary | ICD-10-CM | POA: Insufficient documentation

## 2023-08-20 DIAGNOSIS — R1032 Left lower quadrant pain: Secondary | ICD-10-CM | POA: Diagnosis not present

## 2023-08-20 DIAGNOSIS — I7121 Aneurysm of the ascending aorta, without rupture: Secondary | ICD-10-CM

## 2023-08-20 DIAGNOSIS — F3342 Major depressive disorder, recurrent, in full remission: Secondary | ICD-10-CM | POA: Diagnosis not present

## 2023-08-20 DIAGNOSIS — R5383 Other fatigue: Secondary | ICD-10-CM

## 2023-08-20 DIAGNOSIS — F339 Major depressive disorder, recurrent, unspecified: Secondary | ICD-10-CM

## 2023-08-20 DIAGNOSIS — Z952 Presence of prosthetic heart valve: Secondary | ICD-10-CM

## 2023-08-20 DIAGNOSIS — R739 Hyperglycemia, unspecified: Secondary | ICD-10-CM

## 2023-08-20 DIAGNOSIS — Z1211 Encounter for screening for malignant neoplasm of colon: Secondary | ICD-10-CM | POA: Insufficient documentation

## 2023-08-20 DIAGNOSIS — J449 Chronic obstructive pulmonary disease, unspecified: Secondary | ICD-10-CM

## 2023-08-20 DIAGNOSIS — D509 Iron deficiency anemia, unspecified: Secondary | ICD-10-CM

## 2023-08-20 NOTE — Assessment & Plan Note (Signed)
Known IDA; repeat labs Add vit d and b12

## 2023-08-20 NOTE — Assessment & Plan Note (Signed)
Chronic, repeat labs given c/o fatigue Previously on synthroid 125

## 2023-08-20 NOTE — Patient Instructions (Signed)
**  Please reach out to our clinic [phone] or seek emergent care via ER or call 9-1-1 if Left lower abdominal pain worsens**

## 2023-08-20 NOTE — Progress Notes (Signed)
Established patient visit   Patient: Nancy [name]   DOB: 1961/01/08   62 y.o. Female  MRN: [id number] Visit Date: 08/20/2023  Today's healthcare provider: Jacky Kindle, FNP  Introduced to nurse practitioner role and practice setting.  All questions answered.  Discussed provider/patient relationship and expectations.  Subjective    HPI HPI     Hospitalization Follow-up    Additional comments: Has pain in the left side, fatigue       Last edited by Rolly Salter, CMA on 08/20/2023  3:28 PM.     Follow up Hospitalization  Patient was admitted to [facility] on 9/25 and discharged on 10/2. She was treated for abdominal wall hematoma. Treatment for this included embolization of bleed by VIR. Telephone follow up was done on 10/3 She reports good compliance with treatment. She reports this condition is stayed the same with acute pain noted on LLQ with twinges of pain.  ----------------------------------------------------------------------------------------- -  Medications: Outpatient Medications Prior to Visit  Medication Sig   albuterol (VENTOLIN HFA) 108 (90 Base) MCG/ACT inhaler SMARTSIG:2 Inhalation Via Inhaler Every 6 Hours PRN   amLODipine (NORVASC) 5 MG tablet Take 5 mg by mouth daily.   aspirin EC 81 MG tablet Take by mouth.   doxycycline (PERIOSTAT) 20 MG tablet TAKE 1 TABLET BY MOUTH TWICE DAILY   DULoxetine (CYMBALTA) 20 MG capsule Take 2 capsules (40 mg total) by mouth 2 (two) times daily. (Patient taking differently: Take 40-60 mg by mouth 2 (two) times daily. 60 mg am, 40 mg hs)   enoxaparin (LOVENOX) 100 MG/ML injection Inject into the skin.   ferrous sulfate 325 (65 FE) MG tablet Take 1 tablet (325 mg total) by mouth daily with breakfast.   furosemide (LASIX) 40 MG tablet Take 40 mg by mouth 2 (two) times daily.   ipratropium-albuterol (DUONEB) 0.5-2.5 (3) MG/3ML SOLN Inhale into the lungs.   levothyroxine (SYNTHROID) 125 MCG tablet Take 1 tablet (125 mcg total) by  mouth daily before breakfast.   metoprolol succinate (TOPROL-XL) 25 MG 24 hr tablet Take 25 mg by mouth daily.   ondansetron (ZOFRAN ODT) 4 MG disintegrating tablet Take 1 tablet (4 mg total) by mouth every 8 (eight) hours as needed for nausea or vomiting.   oxyCODONE (OXY IR/ROXICODONE) 5 MG immediate release tablet Take 1 tablet (5 mg total) by mouth every 6 (six) hours as needed for moderate pain.   senna-docusate (SENOKOT-S) 8.6-50 MG tablet Take 1 tablet by mouth 2 (two) times daily as needed for mild constipation.   TRELEGY ELLIPTA 100-62.5-25 MCG/ACT AEPB Inhale into the lungs.   valACYclovir (VALTREX) 1000 MG tablet    warfarin (COUMADIN) 5 MG tablet Take 5 mg by mouth every evening. Per cardiologist: patient supposed to take 7.5 mg 9/24 and 9/25 evening for low INR then resume 5 mg nightly  Goal INR 2.5-3.5   Facility-Administered Medications Prior to Visit  Medication Dose Route Frequency Provider   ipratropium-albuterol (DUONEB) 0.5-2.5 (3) MG/3ML nebulizer solution 3 mL  3 mL Nebulization Once Pollak, Adriana M, PA-C     Objective    BP (!) 82/58 (BP Location: Left Arm)   Pulse 86   Ht 5' 7.5" (1.715 m)   Wt 194 lb 3.2 oz (88.1 kg)   SpO2 96%   BMI 29.97 kg/m   Physical Exam Vitals and nursing note reviewed.  Constitutional:      General: She is not in acute distress.    Appearance: Normal appearance. She  is overweight. She is not ill-appearing, toxic-appearing or diaphoretic.  HENT:     Head: Normocephalic and atraumatic.  Cardiovascular:     Rate and Rhythm: Normal rate and regular rhythm.     Pulses: Normal pulses.     Heart sounds: Normal heart sounds. No murmur heard.    No friction rub. No gallop.  Pulmonary:     Effort: Pulmonary effort is normal. No respiratory distress.     Breath sounds: Normal breath sounds. No stridor. No wheezing, rhonchi or rales.  Chest:     Chest wall: No tenderness.  Abdominal:     General: Bowel sounds are normal.      Palpations: Abdomen is soft.     Tenderness: There is abdominal tenderness.     Comments: Complaints of LLQ pain on exam; multiple healing sites from self injection of LMWH  Musculoskeletal:        General: No swelling, tenderness, deformity or signs of injury. Normal range of motion.     Right lower leg: No edema.     Left lower leg: No edema.  Skin:    General: Skin is warm and dry.     Capillary Refill: Capillary refill takes less than 2 seconds.     Coloration: Skin is not jaundiced or pale.     Findings: No bruising, erythema, lesion or rash.  Neurological:     General: No focal deficit present.     Mental Status: She is alert and oriented to person, place, and time. Mental status is at baseline.     Cranial Nerves: No cranial nerve deficit.     Sensory: No sensory deficit.     Motor: No weakness.     Coordination: Coordination normal.  Psychiatric:        Mood and Affect: Mood normal.        Behavior: Behavior normal.        Thought Content: Thought content normal.        Judgment: Judgment normal.     No results found for any visits on 08/20/23.  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 F/b card No acute concerns        Respiratory   OSA and COPD overlap syndrome (HCC)    Acute on chronic; no current complaints Has upcoming f/b with onc following bronchial valve placements        Endocrine   Other specified hypothyroidism    Chronic, repeat labs given c/o fatigue Previously on synthroid 125      Relevant Orders   TSH   Vitamin D (25 hydroxy)   B12 and Folate Panel     Other   RESOLVED: Clinical depression   Relevant Orders   CBC with Differential/Platelet    Comprehensive Metabolic Panel (CMET)   TSH   Hemoglobin A1c   Lipid panel   Iron, TIBC and Ferritin Panel   Depression, recurrent (HCC)    Acute on chronic, stable Pt reports "things are alright" and she is stable on cymbalta- previously reported at 60 am qAM and 40 mg qPM       Elevated serum glucose  Recommend A1c Continue to recommend balanced, lower carb meals. Smaller meal size, adding snacks. Choosing water as drink of choice and increasing purposeful exercise.       Relevant Orders   Hemoglobin A1c   Hospital discharge follow-up - Primary   Relevant Orders   Ambulatory referral to Gastroenterology   Iron deficiency anemia   Relevant Orders   CBC with Differential/Platelet   Iron, TIBC and Ferritin Panel   Left lower quadrant abdominal pain    Acute, recurrent Recommend gastro cx Pt declines [place] or CT at this time Strict precautions of when to return to office vs seek emergent care given known low CBC and elevated INR; labs pending       Relevant Orders   Ambulatory referral to Gastroenterology   Mechanical heart valve present   Relevant Orders   INR/PT   Other fatigue    Known IDA; repeat labs Add vit d and b12      Screen for colon cancer   Relevant Orders   Ambulatory referral to Gastroenterology   Return if symptoms worsen or fail to improve.     Leilani Merl, FNP, have reviewed all documentation for this visit. The documentation on 08/20/23 for the exam, diagnosis, procedures, and orders are all accurate and complete.  Jacky Kindle, FNP  [facility] [phone] (phone) [phone] (fax)  [facility]

## 2023-08-20 NOTE — Assessment & Plan Note (Signed)
Acute on chronic; no current complaints Has upcoming f/b with onc following bronchial valve placements

## 2023-08-20 NOTE — Assessment & Plan Note (Signed)
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

## 2023-08-20 NOTE — Assessment & Plan Note (Signed)
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

## 2023-08-20 NOTE — Assessment & Plan Note (Signed)
Acute on chronic, stable Pt reports "things are alright" and she is stable on cymbalta- previously reported at 60 am qAM and 40 mg qPM

## 2023-08-20 NOTE — Assessment & Plan Note (Signed)
Acute, recurrent Recommend gastro cx Pt declines [place] or CT at this time Strict precautions of when to return to office vs seek emergent care given known low CBC and elevated INR; labs pending

## 2023-08-20 NOTE — Assessment & Plan Note (Signed)
Recommend A1c Continue to recommend balanced, lower carb meals. Smaller meal size, adding snacks. Choosing water as drink of choice and increasing purposeful exercise.

## 2023-08-21 ENCOUNTER — Other Ambulatory Visit: Payer: Self-pay | Admitting: Family Medicine

## 2023-08-21 LAB — CBC WITH DIFFERENTIAL/PLATELET
Basophils Absolute: 0 10*3/uL (ref 0.0–0.2)
Basos: 1 %
EOS (ABSOLUTE): 0.2 10*3/uL (ref 0.0–0.4)
Eos: 3 %
Hematocrit: 32.7 % — ABNORMAL LOW (ref 34.0–46.6)
Hemoglobin: 10.5 g/dL — ABNORMAL LOW (ref 11.1–15.9)
Immature Grans (Abs): 0 10*3/uL (ref 0.0–0.1)
Immature Granulocytes: 0 %
Lymphocytes Absolute: 1 10*3/uL (ref 0.7–3.1)
Lymphs: 13 %
MCH: 29.9 pg (ref 26.6–33.0)
MCHC: 32.1 g/dL (ref 31.5–35.7)
MCV: 93 fL (ref 79–97)
Monocytes Absolute: 0.8 10*3/uL (ref 0.1–0.9)
Monocytes: 10 %
Neutrophils Absolute: 5.7 10*3/uL (ref 1.4–7.0)
Neutrophils: 73 %
Platelets: 295 10*3/uL (ref 150–450)
RBC: 3.51 x10E6/uL — ABNORMAL LOW (ref 3.77–5.28)
RDW: 14.1 % (ref 11.7–15.4)
WBC: 7.8 10*3/uL (ref 3.4–10.8)

## 2023-08-21 LAB — LIPID PANEL
Chol/HDL Ratio: 4.3 {ratio} (ref 0.0–4.4)
Cholesterol, Total: 162 mg/dL (ref 100–199)
HDL: 38 mg/dL — ABNORMAL LOW (ref >39)
LDL Chol Calc (NIH): 105 mg/dL — ABNORMAL HIGH (ref 0–99)
Triglycerides: 105 mg/dL (ref 0–149)
VLDL Cholesterol Cal: 19 mg/dL (ref 5–40)

## 2023-08-21 LAB — COMPREHENSIVE METABOLIC PANEL
ALT: 15 [IU]/L (ref 0–32)
AST: 21 [IU]/L (ref 0–40)
Albumin: 3.9 g/dL (ref 3.8–4.9)
Alkaline Phosphatase: 129 [IU]/L — ABNORMAL HIGH (ref 44–121)
BUN/Creatinine Ratio: 16 (ref 12–28)
BUN: 14 mg/dL (ref 8–27)
Bilirubin Total: 0.7 mg/dL (ref 0.0–1.2)
CO2: 24 mmol/L (ref 20–29)
Calcium: 8.8 mg/dL (ref 8.7–10.3)
Chloride: 97 mmol/L (ref 96–106)
Creatinine, Ser: 0.86 mg/dL (ref 0.57–1.00)
Globulin, Total: 2.4 g/dL (ref 1.5–4.5)
Glucose: 96 mg/dL (ref 70–99)
Potassium: 3.7 mmol/L (ref 3.5–5.2)
Sodium: 134 mmol/L (ref 134–144)
Total Protein: 6.3 g/dL (ref 6.0–8.5)
eGFR: 77 mL/min/{1.73_m2} (ref >59)

## 2023-08-21 LAB — IRON,TIBC AND FERRITIN PANEL
Ferritin: 144 ng/mL (ref 15–150)
Iron Saturation: 19 % (ref 15–55)
Iron: 60 ug/dL (ref 27–159)
Total Iron Binding Capacity: 314 ug/dL (ref 250–450)
UIBC: 254 ug/dL (ref 131–425)

## 2023-08-21 LAB — PROTIME-INR
INR: 1.2 (ref 0.9–1.2)
Prothrombin Time: 13.6 s — ABNORMAL HIGH (ref 9.1–12.0)

## 2023-08-21 LAB — B12 AND FOLATE PANEL
Folate: 4.8 ng/mL (ref >3.0)
Vitamin B-12: 424 pg/mL (ref 232–1245)

## 2023-08-21 LAB — HEMOGLOBIN A1C
Est. average glucose Bld gHb Est-mCnc: 91 mg/dL
Hgb A1c MFr Bld: 4.8 % (ref 4.8–5.6)

## 2023-08-21 LAB — TSH: TSH: 3.79 u[IU]/mL (ref 0.450–4.500)

## 2023-08-21 LAB — VITAMIN D 25 HYDROXY (VIT D DEFICIENCY, FRACTURES): Vit D, 25-Hydroxy: 19.4 ng/mL — ABNORMAL LOW (ref 30.0–100.0)

## 2023-08-21 MED ORDER — ATORVASTATIN CALCIUM 40 MG PO TABS: 40.0000 mg | ORAL_TABLET | Freq: Every day | ORAL | 3 refills | Status: DC

## 2023-08-21 NOTE — Progress Notes (Signed)
Anemia is improved; reassuring. Stable blood chemistry. Cholesterol remains elevated. Recommend statin to assist in reduction of risk of heart attack and stroke. Please start lipitor 40 mg.  INR is now borderline low at 1.2. Please follow up with cardiology providers. Vit D has worsened; recommend high dose supplement- 5000 IU Vit D3 daily OTC. B-12 has stabilized.

## 2023-08-25 ENCOUNTER — Other Ambulatory Visit: Payer: Self-pay | Admitting: Family Medicine

## 2023-08-25 ENCOUNTER — Encounter: Payer: Self-pay | Admitting: Family Medicine

## 2023-08-25 MED ORDER — TRAZODONE HCL 50 MG PO TABS: 50.0000 mg | ORAL_TABLET | Freq: Every evening | ORAL | 0 refills | Status: DC | PRN

## 2023-09-03 ENCOUNTER — Other Ambulatory Visit: Payer: Self-pay

## 2023-09-04 ENCOUNTER — Ambulatory Visit (INDEPENDENT_AMBULATORY_CARE_PROVIDER_SITE_OTHER): Payer: Self-pay | Admitting: Dermatology

## 2023-09-04 DIAGNOSIS — L988 Other specified disorders of the skin and subcutaneous tissue: Secondary | ICD-10-CM

## 2023-09-04 DIAGNOSIS — Z7189 Other specified counseling: Secondary | ICD-10-CM | POA: Diagnosis not present

## 2023-09-04 DIAGNOSIS — Z79899 Other long term (current) drug therapy: Secondary | ICD-10-CM | POA: Diagnosis not present

## 2023-09-04 DIAGNOSIS — L719 Rosacea, unspecified: Secondary | ICD-10-CM | POA: Diagnosis not present

## 2023-09-04 MED ORDER — DOXYCYCLINE HYCLATE 20 MG PO TABS: ORAL_TABLET | ORAL | 11 refills | Status: DC

## 2023-09-04 NOTE — Patient Instructions (Signed)

## 2023-09-04 NOTE — Progress Notes (Signed)
Follow-Up Visit   Subjective  Nancy [name] is a 62 y.o. female who presents for the following: filler of the oral commissures and ant chin. Rosacea follow up  - pt currently using Doxycycline 20 mg po BID and tolerating medication well with no s/e.  The patient has spots, moles and lesions to be evaluated, some may be new or changing and the patient may have concern these could be cancer.   The following portions of the chart were reviewed this encounter and updated as appropriate: medications, allergies, medical history  Review of Systems:  No other skin or systemic complaints except as noted in HPI or Assessment and Plan.  Objective  Well appearing patient in no apparent distress; mood and affect are within normal limits.  A focused examination was performed of the following areas: the face   Relevant exam findings are noted in the Assessment and Plan.  face Rhytides and volume loss.     Assessment & Plan    Before Restylane Defyne  Before Restylane Defyne   Before Restylane Defyne  Before Restylane Defyne     After Restylane Defyne   After Restylane Defyne   After Restylane Defyne   After Restylane Defyne   Injection diagram   Elastosis of skin face  Discussed Restylane Refyne or Restylane Defyne for corners of mouth/Marionette lines/oral commissures. May overlay Restylane Refyne at next visit to areas tx today.  Discussed Botox 25 units to the frown complex.   Filling material injection - face Prior to the procedure, the patient's past medical history, allergies and the rare but potential risks and complications were reviewed with the patient and a signed consent was obtained. Pre and post-treatment care was discussed and instructions provided.  Risks including vascular occlusion were discussed.   Location: See attached photo  Filler Type: Restylane defyne  Procedure: The area was prepped thoroughly with Puracyn. After introducing the needle  into the desired treatment area, the syringe plunger was drawn back to ensure there was no flash of blood prior to injecting the filler in order to minimize risk of intravascular injection and vascular occlusion. After injection of the filler, the treated areas were cleansed and iced to reduce swelling. Post-treatment instructions were reviewed with the patient.       Patient tolerated the procedure well. The patient will call with any problems, questions or concerns prior to their next appointment.  GEX#[id number] Expiration date:02/09/2024  ROSACEA Exam Mid face erythema with telangiectasias +/- scattered inflammatory papules  Chronic condition with duration or expected duration over one year. Currently well-controlled.  Rosacea is a chronic progressive skin condition usually affecting the face of adults, causing redness and/or acne bumps. It is treatable but not curable. It sometimes affects the eyes (ocular rosacea) as well. It may respond to topical and/or systemic medication and can flare with stress, sun exposure, alcohol, exercise, topical steroids (including hydrocortisone/cortisone 10) and some foods.  Daily application of broad spectrum spf 30+ sunscreen to face is recommended to reduce flares.  Treatment Plan Continue Doxycycline 20 mg po BID. Doxycycline should be taken with food to prevent nausea. Do not lay down for 30 minutes after taking. Be cautious with sun exposure and use good sun protection while on this medication. Pregnant women should not take this medication.   Counseling for BBL / IPL / Laser and Coordination of Care Discussed the treatment option of Broad Band Light (BBL) /Intense Pulsed Light (IPL)/ Laser for skin discoloration, including brown spots and redness.  Typically we recommend at least 1-3 treatment sessions about 5-8 weeks apart for best results.  Cannot have tanned skin when BBL performed, and regular use of sunscreen/photoprotection is advised after the  procedure to help maintain results. The patient's condition may also require "maintenance treatments" in the future.  The fee for BBL / laser treatments is $350 per treatment session for the whole face.  A fee can be quoted for other parts of the body.  Insurance typically does not pay for BBL/laser treatments and therefore the fee is an out-of-pocket cost. Recommend prophylactic valtrex treatment. Once scheduled for procedure, will send Rx in prior to patient's appointment.   Return in about 4 months (around 01/05/2024) for filler of the oral commissures and ant chin; 1 year for rosacea follow up.  Maylene Roes, CMA, am acting as scribe for Armida Sans, MD .   Documentation: I have reviewed the above documentation for accuracy and completeness, and I agree with the above.  Armida Sans, MD

## 2023-09-11 ENCOUNTER — Other Ambulatory Visit: Payer: Self-pay

## 2023-09-11 ENCOUNTER — Encounter: Payer: Self-pay | Admitting: Gastroenterology

## 2023-09-11 ENCOUNTER — Ambulatory Visit (INDEPENDENT_AMBULATORY_CARE_PROVIDER_SITE_OTHER): Payer: 59 | Admitting: Gastroenterology

## 2023-09-11 VITALS — BP 137/71 | HR 86 | Temp 98.4°F | Ht 67.5 in | Wt 195.4 lb

## 2023-09-11 DIAGNOSIS — Z1211 Encounter for screening for malignant neoplasm of colon: Secondary | ICD-10-CM

## 2023-09-11 MED ORDER — NA SULFATE-K SULFATE-MG SULF 17.5-3.13-1.6 GM/177ML PO SOLN: 354.0000 mL | Freq: Once | ORAL | 0 refills | Status: AC

## 2023-09-11 NOTE — Progress Notes (Signed)
Arlyss Repress, MD [address]  [address]  [place], [place] [address]  Main: [phone]  Fax: [phone]    Gastroenterology Consultation  Referring Provider:     Jacky Kindle, FNP Primary Care Physician:  Jacky Kindle, FNP Primary Gastroenterologist:  Dr. Arlyss Repress Reason for Consultation: Left lower quadrant pain        HPI:   Nancy [name] is a 62 y.o. female referred by Jacky Kindle, FNP  for consultation & management of left lower quadrant pain.  Patient was admitted to [facility] from 9/25 through 10/2 secondary to acute blood loss from abdominal wall hematoma in setting of anticoagulation.  She underwent successful embolization of the bleeding vessel.  She has history of COPD, mechanical aortic valve replacement in 06/2009, descending thoracic aortic dissection s/p repair in 08/2009, insertion of graft aorta/great vessels in November 2023, endovascular repair of descending thoracic aorta in February 2024, hypertension, hypothyroidism, OSA on 2 L of oxygen at night, rosacea low-dose doxycycline, s/p bronchoscopic lung volume reduction surgery with Zephyr endobronchial valves to RUL/RML on 07/24/2023.   Patient was referred to GI for evaluation of left lower quadrant pain post hospitalization.  Today, she is no longer experiencing abdominal discomfort.  She denies any constipation or diarrhea.  She denies any other GI symptoms She tells me that she is due for her screening colonoscopy  NSAIDs: None  Antiplts/Anticoagulants/Anti thrombotics: Warfarin for mechanical aortic valve  GI Procedures: Colonoscopy in 2014, normal She denies any family history of colon cancer in first-degree relative or multiple second-degree relatives  Past Medical History:  Diagnosis Date   Aortic dissection (HCC) 2010   COPD (chronic obstructive pulmonary disease) (HCC)    Depression    Hypertension    Hypothyroidism    Pneumonia    PONV (postoperative nausea and vomiting)     Rosacea    Seasonal allergies    Sleep apnea    Does not use CPAP    Past Surgical History:  Procedure Laterality Date   ABDOMINAL HYSTERECTOMY  2007   AORTIC VALVE REPLACEMENT  06/2009   repeated 2017   APPENDECTOMY  1985   BREAST BIOPSY Right 11/2020   benign   BREAST CYST ASPIRATION     does not remember any details   CARPAL TUNNEL RELEASE Left 04/12/2013   elbow   CATARACT EXTRACTION Right 1994   FOOT SURGERY Left 1997-1998   IR EMBO ART  VEN HEMORR LYMPH EXTRAV  INC GUIDE ROADMAPPING  08/10/2023   ORIF ANKLE FRACTURE Right 12/15/2020   Procedure: OPEN REDUCTION INTERNAL FIXATION (ORIF) RIGHT TRIMALLEOLAR ANKLE FRACTURE;  Surgeon: [name], MD;  Location: [facility];  Service: Orthopedics;  Laterality: Right;     Current Outpatient Medications:    acetaminophen (TYLENOL) 500 MG tablet, Take 500 mg by mouth every 4 (four) hours as needed., Disp: , Rfl:    albuterol (VENTOLIN HFA) 108 (90 Base) MCG/ACT inhaler, SMARTSIG:2 Inhalation Via Inhaler Every 6 Hours PRN, Disp: , Rfl:    amLODipine (NORVASC) 5 MG tablet, Take 5 mg by mouth daily., Disp: , Rfl:    aspirin EC 81 MG tablet, Take by mouth., Disp: , Rfl:    doxycycline (PERIOSTAT) 20 MG tablet, Take one tab po BID with food., Disp: 60 tablet, Rfl: 11   DULoxetine (CYMBALTA) 20 MG capsule, Take 2 capsules (40 mg total) by mouth 2 (two) times daily. (Patient taking differently: Take 40-60 mg by mouth 2 (two)  times daily. 60 mg am, 40 mg hs), Disp: 360 capsule, Rfl: 3   furosemide (LASIX) 40 MG tablet, Take 40 mg by mouth 2 (two) times daily., Disp: , Rfl:    ipratropium-albuterol (DUONEB) 0.5-2.5 (3) MG/3ML SOLN, Inhale into the lungs., Disp: , Rfl:    levothyroxine (SYNTHROID) 125 MCG tablet, Take 1 tablet (125 mcg total) by mouth daily before breakfast., Disp: 90 tablet, Rfl: 3   metoprolol succinate (TOPROL-XL) 25 MG 24 hr tablet, Take 25 mg by mouth daily., Disp: , Rfl:    ondansetron (ZOFRAN ODT) 4 MG  disintegrating tablet, Take 1 tablet (4 mg total) by mouth every 8 (eight) hours as needed for nausea or vomiting., Disp: 20 tablet, Rfl: 2   oxyCODONE (OXY IR/ROXICODONE) 5 MG immediate release tablet, Take 1 tablet (5 mg total) by mouth every 6 (six) hours as needed for moderate pain., Disp: 30 tablet, Rfl: 0   polyethylene glycol (MIRALAX / GLYCOLAX) 17 g packet, Take 17 g by mouth daily as needed., Disp: , Rfl:    senna-docusate (SENOKOT-S) 8.6-50 MG tablet, Take 1 tablet by mouth 2 (two) times daily as needed for mild constipation., Disp: 20 tablet, Rfl: 0   traZODone (DESYREL) 50 MG tablet, Take 1-2 tablets (50-100 mg total) by mouth at bedtime as needed for sleep., Disp: 60 tablet, Rfl: 0   TRELEGY ELLIPTA 100-62.5-25 MCG/ACT AEPB, Inhale into the lungs., Disp: , Rfl:    valACYclovir (VALTREX) 1000 MG tablet, , Disp: , Rfl:    warfarin (COUMADIN) 5 MG tablet, Take 5 mg by mouth every evening. Per cardiologist: patient supposed to take 7.5 mg 9/24 and 9/25 evening for low INR then resume 5 mg nightly  Goal INR 2.5-3.5, Disp: , Rfl:    atorvastatin (LIPITOR) 40 MG tablet, Take 1 tablet (40 mg total) by mouth daily. (Patient not taking: Reported on 09/11/2023), Disp: 90 tablet, Rfl: 3   ferrous sulfate 325 (65 FE) MG tablet, Take 1 tablet (325 mg total) by mouth daily with breakfast. (Patient not taking: Reported on 09/11/2023), Disp: 30 tablet, Rfl: 0  Current Facility-Administered Medications:    ipratropium-albuterol (DUONEB) 0.5-2.5 (3) MG/3ML nebulizer solution 3 mL, 3 mL, Nebulization, Once, Pollak, Adriana M, PA-C   Family History  Problem Relation Age of Onset   Pneumonia Mother    Breast cancer Mother 63       twice 68's and 49   Hypertension Father    Heart disease Father    Healthy Sister    Colon cancer Maternal Grandmother    Heart disease Paternal Grandmother    Prostate cancer Neg Hx    Kidney cancer Neg Hx      Social History   Tobacco Use   Smoking status: Former     Current packs/day: 0.00    Average packs/day: 0.5 packs/day for 30.0 years (15.0 ttl pk-yrs)    Types: Cigarettes    Start date: 03/11/1986    Quit date: 03/11/2016    Years since quitting: 7.5   Smokeless tobacco: Never   Tobacco comments:    patient states  that she is working at her own pace to quit smoking  Vaping Use   Vaping status: Former  Substance Use Topics   Alcohol use: Yes    Alcohol/week: 0.0 standard drinks of alcohol    Comment: OCCASIONALLY   Drug use: No    Allergies as of 09/11/2023 - Review Complete 09/11/2023  Allergen Reaction Noted   Codeine Nausea Only 05/09/2015  Review of Systems:    All systems reviewed and negative except where noted in HPI.   Physical Exam:  BP 137/71 (BP Location: Right Arm, Patient Position: Sitting, Cuff Size: Normal)   Pulse 86   Temp 98.4 F (36.9 C) (Oral)   Ht 5' 7.5" (1.715 m)   Wt 195 lb 6 oz (88.6 kg)   BMI 30.15 kg/m  No LMP recorded. Patient has had a hysterectomy.  General:   Alert,  Well-developed, well-nourished, pleasant and cooperative in NAD Head:  Normocephalic and atraumatic. Eyes:  Sclera clear, no icterus.   Conjunctiva pink. Ears:  Normal auditory acuity. Nose:  No deformity, discharge, or lesions. Mouth:  No deformity or lesions,oropharynx pink & moist. Neck:  Supple; no masses or thyromegaly. Lungs:  Respirations even and unlabored.  Clear throughout to auscultation.   No wheezes, crackles, or rhonchi. No acute distress. Heart:  Regular rate and rhythm; no murmurs, clicks, rubs, or gallops. Abdomen:  Normal bowel sounds. Soft, non-tender and non-distended without masses, hepatosplenomegaly or hernias noted.  No guarding or rebound tenderness.   Rectal: Not performed Msk:  Symmetrical without gross deformities. Good, equal movement & strength bilaterally. Pulses:  Normal pulses noted. Extremities:  No clubbing or edema.  No cyanosis. Neurologic:  Alert and oriented x3;  grossly normal  neurologically. Skin:  Intact without significant lesions or rashes. No jaundice. Psych:  Alert and cooperative. Normal mood and affect.  Imaging Studies: Reviewed  Assessment and Plan:   Nancy [name] is a 62 y.o. pleasant Caucasian female with complex vascular history, status post mechanical aortic valve on Coumadin, OSA, COPD on oxygen at night, recent abdominal wall hematoma status post successful embolization of the bleeding vessel  Patient is due for screening colonoscopy Will obtain clearance from her cardiologist for cardiac clearance as well as interruption of Coumadin with Lovenox bridge periprocedural.  I have discussed alternative options, risks & benefits,  which include, but are not limited to, bleeding, infection, perforation,respiratory complication & drug reaction.  The patient agrees with this plan & written consent will be obtained.     Follow up as needed   Arlyss Repress, MD

## 2023-09-18 ENCOUNTER — Telehealth: Payer: Self-pay

## 2023-09-18 ENCOUNTER — Other Ambulatory Visit: Payer: Self-pay | Admitting: Family Medicine

## 2023-09-18 DIAGNOSIS — Z1231 Encounter for screening mammogram for malignant neoplasm of breast: Secondary | ICD-10-CM

## 2023-09-18 NOTE — Telephone Encounter (Signed)
Dr. Darrold Junker patient cardiologist got back with [place] about stopping patient Warfarin before her colonoscopy on 10/13/2023. They are going to do a Lovenox bridge and will call patient with instructions

## 2023-09-20 ENCOUNTER — Encounter: Payer: Self-pay | Admitting: Dermatology

## 2023-10-13 ENCOUNTER — Ambulatory Visit: Payer: 59 | Admitting: Certified Registered Nurse Anesthetist

## 2023-10-13 ENCOUNTER — Ambulatory Visit
Admission: RE | Admit: 2023-10-13 | Discharge: 2023-10-13 | Disposition: A | Discharge status: Home / Self Care | Payer: 59 | Source: Ambulatory Visit | Attending: Gastroenterology | Admitting: Gastroenterology

## 2023-10-13 ENCOUNTER — Encounter
Admission: RE | Disposition: A | Discharge status: Home / Self Care | Payer: Self-pay | Source: Ambulatory Visit | Attending: Gastroenterology

## 2023-10-13 ENCOUNTER — Encounter: Payer: Self-pay | Admitting: Gastroenterology

## 2023-10-13 DIAGNOSIS — D123 Benign neoplasm of transverse colon: Secondary | ICD-10-CM

## 2023-10-13 DIAGNOSIS — E039 Hypothyroidism, unspecified: Secondary | ICD-10-CM | POA: Insufficient documentation

## 2023-10-13 DIAGNOSIS — Z79890 Hormone replacement therapy (postmenopausal): Secondary | ICD-10-CM | POA: Diagnosis not present

## 2023-10-13 DIAGNOSIS — K635 Polyp of colon: Secondary | ICD-10-CM

## 2023-10-13 DIAGNOSIS — D120 Benign neoplasm of cecum: Secondary | ICD-10-CM | POA: Diagnosis not present

## 2023-10-13 DIAGNOSIS — I10 Essential (primary) hypertension: Secondary | ICD-10-CM | POA: Diagnosis not present

## 2023-10-13 DIAGNOSIS — D122 Benign neoplasm of ascending colon: Secondary | ICD-10-CM

## 2023-10-13 DIAGNOSIS — D125 Benign neoplasm of sigmoid colon: Secondary | ICD-10-CM | POA: Diagnosis not present

## 2023-10-13 DIAGNOSIS — Z1211 Encounter for screening for malignant neoplasm of colon: Secondary | ICD-10-CM

## 2023-10-13 DIAGNOSIS — Z87891 Personal history of nicotine dependence: Secondary | ICD-10-CM | POA: Insufficient documentation

## 2023-10-13 DIAGNOSIS — K5730 Diverticulosis of large intestine without perforation or abscess without bleeding: Secondary | ICD-10-CM

## 2023-10-13 DIAGNOSIS — D124 Benign neoplasm of descending colon: Secondary | ICD-10-CM | POA: Diagnosis not present

## 2023-10-13 DIAGNOSIS — J449 Chronic obstructive pulmonary disease, unspecified: Secondary | ICD-10-CM | POA: Diagnosis not present

## 2023-10-13 DIAGNOSIS — F32A Depression, unspecified: Secondary | ICD-10-CM | POA: Insufficient documentation

## 2023-10-13 DIAGNOSIS — G4730 Sleep apnea, unspecified: Secondary | ICD-10-CM | POA: Insufficient documentation

## 2023-10-13 HISTORY — PX: HEMOSTASIS CLIP PLACEMENT: SHX6857

## 2023-10-13 HISTORY — PX: COLONOSCOPY WITH PROPOFOL: SHX5780

## 2023-10-13 HISTORY — PX: POLYPECTOMY: SHX5525

## 2023-10-13 SURGERY — COLONOSCOPY WITH PROPOFOL
Anesthesia: General

## 2023-10-13 MED ORDER — DEXMEDETOMIDINE HCL IN NACL 80 MCG/20ML IV SOLN
INTRAVENOUS | Status: AC
  Filled 2023-10-13: qty 20

## 2023-10-13 MED ORDER — PROPOFOL 10 MG/ML IV BOLUS
INTRAVENOUS | Status: DC | PRN
  Administered 2023-10-13: 150 ug/kg/min via INTRAVENOUS
  Administered 2023-10-13: 50 mg via INTRAVENOUS
  Administered 2023-10-13: 100 mg via INTRAVENOUS

## 2023-10-13 MED ORDER — PROPOFOL 10 MG/ML IV BOLUS: INTRAVENOUS | Status: DC | PRN

## 2023-10-13 MED ORDER — PROPOFOL 1000 MG/100ML IV EMUL
INTRAVENOUS | Status: AC
  Filled 2023-10-13: qty 100

## 2023-10-13 MED ORDER — SODIUM CHLORIDE 0.9 % IV SOLN: INTRAVENOUS | Status: DC

## 2023-10-13 MED ORDER — DEXMEDETOMIDINE HCL IN NACL 80 MCG/20ML IV SOLN: INTRAVENOUS | Status: DC | PRN

## 2023-10-13 MED ORDER — LIDOCAINE HCL (CARDIAC) PF 100 MG/5ML IV SOSY
PREFILLED_SYRINGE | INTRAVENOUS | Status: DC | PRN
  Administered 2023-10-13: 50 mg via INTRAVENOUS

## 2023-10-13 MED ORDER — DEXMEDETOMIDINE HCL IN NACL 80 MCG/20ML IV SOLN
INTRAVENOUS | Status: DC | PRN
  Administered 2023-10-13: 8 ug via INTRAVENOUS

## 2023-10-13 NOTE — Op Note (Signed)
[facility] Gastroenterology Patient Name: Nancy [name] Procedure Date: 10/13/2023 9:17 AM MRN: [id number] Account #: [id number] Date of Birth: 12-20-1960 Admit Type: Outpatient Age: 62 Room: [facility] ENDO ROOM 4 Gender: Female Note Status: Finalized Instrument Name: Prentice Docker [id number] Procedure:             Colonoscopy Indications:           Screening for colorectal malignant neoplasm, Last                         colonoscopy: October 2014, Last colonoscopy 10 years                         ago Providers:             Toney Reil MD, MD Referring MD:          Daryl Eastern. Suzie Portela (Referring MD) Medicines:             General Anesthesia Complications:         No immediate complications. Estimated blood loss: None. Procedure:             Pre-Anesthesia Assessment:                        - Prior to the procedure, a History and Physical was                         performed, and patient medications and allergies were                         reviewed. The patient is competent. The risks and                         benefits of the procedure and the sedation options and                         risks were discussed with the patient. All questions                         were answered and informed consent was obtained.                         Patient identification and proposed procedure were                         verified by the physician, the nurse, the                         anesthesiologist, the anesthetist and the technician                         in the pre-procedure area in the procedure room in the                         endoscopy suite. Mental Status Examination: alert and                         oriented. Airway Examination: normal oropharyngeal  airway and neck mobility. Respiratory Examination:                         clear to auscultation. CV Examination: normal.                         Prophylactic Antibiotics: The patient does not  require                         prophylactic antibiotics. Prior Anticoagulants: The                         patient has taken Coumadin (warfarin), last dose was 5                         days prior to procedure. ASA Grade Assessment: III - A                         patient with severe systemic disease. After reviewing                         the risks and benefits, the patient was deemed in                         satisfactory condition to undergo the procedure. The                         anesthesia plan was to use general anesthesia.                         Immediately prior to administration of medications,                         the patient was re-assessed for adequacy to receive                         sedatives. The heart rate, respiratory rate, oxygen                         saturations, blood pressure, adequacy of pulmonary                         ventilation, and response to care were monitored                         throughout the procedure. The physical status of the                         patient was re-assessed after the procedure.                        After obtaining informed consent, the colonoscope was                         passed under direct vision. Throughout the procedure,                         the patient's blood pressure, pulse, and oxygen  saturations were monitored continuously. The                         Colonoscope was introduced through the anus and                         advanced to the the cecum, identified by appendiceal                         orifice and ileocecal valve. The colonoscopy was                         performed with moderate difficulty due to significant                         looping and the patient's body habitus. Successful                         completion of the procedure was aided by applying                         abdominal pressure. The patient tolerated the                         procedure well. The  quality of the bowel preparation                         was evaluated using the BBPS [facility] Bowel Preparation                         Scale) with scores of: Right Colon = 3, Transverse                         Colon = 3 and Left Colon = 3 (entire mucosa seen well                         with no residual staining, small fragments of stool or                         opaque liquid). The total BBPS score equals 9. The                         ileocecal valve, appendiceal orifice, and rectum were                         photographed. Findings:      The perianal and digital rectal examinations were normal. Pertinent       negatives include normal sphincter tone and no palpable rectal lesions.      A 4 mm polyp was found in the cecum. The polyp was sessile. The polyp       was removed with a cold snare. Resection and retrieval were complete.       Estimated blood loss: none.      A 7 mm polyp was found in the ascending colon. The polyp was sessile.       The polyp was removed with a cold snare. Resection and retrieval were  complete. Estimated blood loss was minimal. To prevent bleeding after       the polypectomy, two hemostatic clips were successfully placed (MR       safe). Clip manufacturer: [employer]. There was no bleeding at       the end of the procedure.      A 5 mm polyp was found in the ascending colon. The polyp was sessile.       The polyp was removed with a cold snare. Resection and retrieval were       complete.      A diminutive polyp was found in the transverse colon. The polyp was       sessile. The polyp was removed with a jumbo cold forceps. Resection and       retrieval were complete.      Two sessile polyps were found in the transverse colon. The polyps were 3       to 5 mm in size. These polyps were removed with a cold snare. Resection       and retrieval were complete. Estimated blood loss was minimal. To       prevent bleeding after the polypectomy, one  hemostatic clip was       successfully placed (MR safe). Clip manufacturer: [employer].       There was no bleeding at the end of the procedure.      Three sessile polyps were found in the descending colon. The polyps were       3 to 5 mm in size. These polyps were removed with a cold snare.       Resection and retrieval were complete.      Multiple small-mouthed diverticula were found in the recto-sigmoid colon       and sigmoid colon.      The retroflexed view of the distal rectum and anal verge was normal and       showed no anal or rectal abnormalities. Impression:            - One 4 mm polyp in the cecum, removed with a cold                         snare. Resected and retrieved.                        - One 7 mm polyp in the ascending colon, removed with                         a cold snare. Resected and retrieved. Clip                         manufacturer: [employer]. Clips (MR safe) were                         placed.                        - One 5 mm polyp in the ascending colon, removed with                         a cold snare. Resected and retrieved.                        -  One diminutive polyp in the transverse colon,                         removed with a jumbo cold forceps. Resected and                         retrieved.                        - Two 3 to 5 mm polyps in the transverse colon,                         removed with a cold snare. Resected and retrieved.                         Clip manufacturer: [employer]. Clip (MR safe)                         was placed.                        - Three 3 to 5 mm polyps in the descending colon,                         removed with a cold snare. Resected and retrieved.                        - Diverticulosis in the recto-sigmoid colon and in the                         sigmoid colon.                        - The distal rectum and anal verge are normal on                         retroflexion  view. Recommendation:        - Discharge patient to home (with escort).                        - Resume previous diet today.                        - Continue present medications.                        - Await pathology results.                        - Resume Lovenox (enoxaparin) today at prior dose.                         Refer to managing physician for further adjustment of                         therapy.                        - Repeat colonoscopy in 3 years for surveillance of  multiple polyps. Procedure Code(s):     --- Professional ---                        [phone], Colonoscopy, flexible; with removal of                         tumor(s), polyp(s), or other lesion(s) by snare                         technique                        45380, 59, Colonoscopy, flexible; with biopsy, single                         or multiple Diagnosis Code(s):     --- Professional ---                        Z12.11, Encounter for screening for malignant neoplasm                         of colon                        D12.0, Benign neoplasm of cecum                        D12.2, Benign neoplasm of ascending colon                        D12.4, Benign neoplasm of descending colon                        D12.3, Benign neoplasm of transverse colon (hepatic                         flexure or splenic flexure)                        K57.30, Diverticulosis of large intestine without                         perforation or abscess without bleeding CPT copyright 2022 American Medical Association. All rights reserved. The codes documented in this report are preliminary and upon coder review may  be revised to meet current compliance requirements. Dr. Libby Maw Toney Reil MD, MD 10/13/2023 10:12:07 AM This report has been signed electronically. Number of Addenda: 0 Note Initiated On: 10/13/2023 9:17 AM Scope Withdrawal Time: 0 hours 26 minutes 40 seconds  Total Procedure  Duration: 0 hours 32 minutes 45 seconds  Estimated Blood Loss:  Estimated blood loss: none.      [facility]

## 2023-10-13 NOTE — Transfer of Care (Signed)
Immediate Anesthesia Transfer of Care Note  Patient: Nancy [name]  Procedure(s) Performed: COLONOSCOPY WITH PROPOFOL POLYPECTOMY HEMOSTASIS CLIP PLACEMENT  Patient Location: Endoscopy Unit  Anesthesia Type:General  Level of Consciousness: awake, alert , and oriented  Airway & Oxygen Therapy: Patient Spontanous Breathing  Post-op Assessment: Report given to RN and Post -op Vital signs reviewed and stable  Post vital signs: Reviewed and stable  Last Vitals:  Vitals Value Taken Time  BP 115/59 10/13/23 1013  Temp 36.4 C 10/13/23 1013  Pulse 78 10/13/23 1013  Resp 22 10/13/23 1013  SpO2 100 % 10/13/23 1013  Vitals shown include unfiled device data.  Last Pain:  Vitals:   10/13/23 1013  TempSrc: Temporal  PainSc: 0-No pain         Complications: No notable events documented.

## 2023-10-13 NOTE — H&P (Signed)
Arlyss Repress, MD [address]  [address]  [place], [place] [address]  Main: [phone]  Fax: [phone] Pager: [phone]  Primary Care Physician:  Jacky Kindle, FNP Primary Gastroenterologist:  Dr. Arlyss Repress  Pre-Procedure History & Physical: HPI:  Nancy [name] is a 62 y.o. female is here for an colonoscopy.   Past Medical History:  Diagnosis Date   Aortic dissection (HCC) 2010   COPD (chronic obstructive pulmonary disease) (HCC)    Depression    Hypertension    Hypothyroidism    Pneumonia    PONV (postoperative nausea and vomiting)    Rosacea    Seasonal allergies    Sleep apnea    Does not use CPAP    Past Surgical History:  Procedure Laterality Date   ABDOMINAL AORTA STENT  2024   ABDOMINAL HYSTERECTOMY  2007   abdominal wall hematoma     AORTIC VALVE REPLACEMENT  06/2009   repeated 2017   APPENDECTOMY  1985   BREAST BIOPSY Right 11/2020   benign   BREAST CYST ASPIRATION     does not remember any details   CARPAL TUNNEL RELEASE Left 04/12/2013   elbow   CATARACT EXTRACTION Right 1994   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 Authorizing Provider  acetaminophen (TYLENOL) 500 MG tablet Take 500 mg by mouth every 4 (four) hours as needed.    [provider]  albuterol (VENTOLIN HFA) 108 (90 Base) MCG/ACT inhaler SMARTSIG:2 Inhalation Via Inhaler Every 6 Hours PRN 08/14/21   [provider]  amLODipine (NORVASC) 5 MG tablet Take 5 mg by mouth daily. 07/30/11    [provider]  atorvastatin (LIPITOR) 40 MG tablet Take 1 tablet (40 mg total) by mouth daily. Patient not taking: Reported on 09/11/2023 08/21/23   Jacky Kindle, FNP  doxycycline (PERIOSTAT) 20 MG tablet Take one tab po BID with food. 09/04/23   Deirdre Evener, MD  DULoxetine (CYMBALTA) 20 MG capsule Take 2 capsules (40 mg total) by mouth 2 (two) times daily. Patient taking differently: Take 40-60 mg by mouth 2 (two) times daily. 60 mg am, 40 mg hs 01/28/23   Merita Norton T, FNP  ferrous sulfate 325 (65 FE) MG tablet Take 1 tablet (325 mg total) by mouth daily with breakfast. Patient not taking: Reported on 09/11/2023 08/13/23 09/12/23  Tresa Moore, MD  furosemide (LASIX) 40 MG tablet Take 40 mg by mouth 2 (two) times daily. 07/30/11   [provider]  ipratropium-albuterol (DUONEB) 0.5-2.5 (3) MG/3ML SOLN Inhale into the lungs. 07/28/23   [provider]  levothyroxine (SYNTHROID) 125 MCG tablet Take 1 tablet (125 mcg total) by mouth daily before breakfast. 02/23/23   Jacky Kindle, FNP  metoprolol succinate (TOPROL-XL) 25 MG 24 hr tablet Take 25 mg by mouth daily.    [provider]  ondansetron (ZOFRAN ODT) 4 MG disintegrating tablet Take 1 tablet (4 mg total) by mouth every 8 (eight)  hours as needed for nausea or vomiting. 10/01/22   Jacky Kindle, FNP  oxyCODONE (OXY IR/ROXICODONE) 5 MG immediate release tablet Take 1 tablet (5 mg total) by mouth every 6 (six) hours as needed for moderate pain. 08/13/23   Sreenath, Sudheer B, MD  polyethylene glycol (MIRALAX / GLYCOLAX) 17 g packet Take 17 g by mouth daily as needed. 01/13/23   [provider]  senna-docusate (SENOKOT-S) 8.6-50 MG tablet Take 1 tablet by mouth 2 (two) times daily as needed for mild constipation. 08/13/23   Tresa Moore, MD  traZODone (DESYREL) 50 MG tablet Take 1-2 tablets (50-100 mg total) by mouth at bedtime as needed for sleep. Patient not taking: Reported on  10/06/2023 08/25/23   Jacky Kindle, FNP  TRELEGY ELLIPTA 100-62.5-25 MCG/ACT AEPB Inhale into the lungs. 10/08/22   [provider]  valACYclovir (VALTREX) 1000 MG tablet     [provider]  warfarin (COUMADIN) 5 MG tablet Take 5 mg by mouth every evening. Per cardiologist: patient supposed to take 7.5 mg 9/24 and 9/25 evening for low INR then resume 5 mg nightly  Goal INR 2.5-3.5 06/01/14   [provider]    Allergies as of 09/11/2023 - Review Complete 09/11/2023  Allergen Reaction Noted   Codeine Nausea Only 05/09/2015    Family History  Problem Relation Age of Onset   Pneumonia Mother    Breast cancer Mother 39       twice 63's and 78   Hypertension Father    Heart disease Father    Healthy Sister    Colon cancer Maternal Grandmother    Heart disease Paternal Grandmother    Prostate cancer Neg Hx    Kidney cancer Neg Hx     Social History   Socioeconomic History   Marital status: Single    Spouse name: Not on file   Number of children: 0   Years of education: 13   Highest education level: High school graduate  Occupational History    Employer: [employer]  Tobacco Use   Smoking status: Former    Current packs/day: 0.00    Average packs/day: 0.5 packs/day for 30.0 years (15.0 ttl pk-yrs)    Types: Cigarettes    Start date: 03/11/1986    Quit date: 03/11/2016    Years since quitting: 7.5   Smokeless tobacco: Never   Tobacco comments:    patient states  that she is working at her own pace to quit smoking  Vaping Use   Vaping status: Former  Substance and Sexual Activity   Alcohol use: Yes    Alcohol/week: 0.0 standard drinks of alcohol    Comment: OCCASIONALLY   Drug use: No   Sexual activity: Not on file  Other Topics Concern   Not on file  Social History Narrative   Not on file   Social Determinants of Health   Financial Resource Strain: Low Risk  (07/22/2023)   Received from [facility]   Overall Financial  Resource Strain (CARDIA)    Difficulty of Paying Living Expenses: Not very hard  Food Insecurity: No Food Insecurity (08/06/2023)   Hunger Vital Sign    Worried About Running Out of Food in the Last Year: Never true    Ran Out of Food in the Last Year: Never true  Transportation Needs: No Transportation Needs (08/06/2023)   PRAPARE - Administrator, Civil Service (Medical): No    Lack of Transportation (Non-Medical):  No  Physical Activity: Inactive (01/02/2018)   Exercise Vital Sign    Days of Exercise per Week: 0 days    Minutes of Exercise per Session: 0 min  Stress: Not on file  Social Connections: Not on file  Intimate Partner Violence: Not At Risk (08/06/2023)   Humiliation, Afraid, Rape, and Kick questionnaire    Fear of Current or Ex-Partner: No    Emotionally Abused: No    Physically Abused: No    Sexually Abused: No    Review of Systems: See HPI, otherwise negative ROS  Physical Exam: BP (!) 146/64   Pulse 79   Temp 97.6 F (36.4 C) (Temporal)   Resp (!) 21   Wt 191 lb (86.6 kg)   SpO2 100%   BMI 29.47 kg/m  General:   Alert,  pleasant and cooperative in NAD Head:  Normocephalic and atraumatic. Neck:  Supple; no masses or thyromegaly. Lungs:  Clear throughout to auscultation.    Heart:  Regular rate and rhythm. Abdomen:  Soft, nontender and nondistended. Normal bowel sounds, without guarding, and without rebound.   Neurologic:  Alert and  oriented x4;  grossly normal neurologically.  Impression/Plan: [name] is here for an colonoscopy to be performed for colon cancer screening  Risks, benefits, limitations, and alternatives regarding  colonoscopy have been reviewed with the patient.  Questions have been answered.  All parties agreeable.   Lannette Donath, MD  10/13/2023, 11:03 AM

## 2023-10-13 NOTE — Anesthesia Postprocedure Evaluation (Signed)
Anesthesia Post Note  Patient: Nancy [name]  Procedure(s) Performed: COLONOSCOPY WITH PROPOFOL POLYPECTOMY HEMOSTASIS CLIP PLACEMENT  Patient location during evaluation: Endoscopy Anesthesia Type: General Level of consciousness: awake and alert Pain management: pain level controlled Vital Signs Assessment: post-procedure vital signs reviewed and stable Respiratory status: spontaneous breathing, nonlabored ventilation, respiratory function stable and patient connected to nasal cannula oxygen Cardiovascular status: blood pressure returned to baseline and stable Postop Assessment: no apparent nausea or vomiting Anesthetic complications: no   No notable events documented.   Last Vitals:  Vitals:   10/13/23 1013 10/13/23 1032  BP: (!) 115/59 (!) 146/64  Pulse: 79   Resp: (!) 21   Temp: 36.4 C   SpO2: 100%     Last Pain:  Vitals:   10/13/23 1032  TempSrc:   PainSc: 0-No pain                 Cleda Mccreedy Maureen Duesing

## 2023-10-13 NOTE — Anesthesia Procedure Notes (Signed)
Date/Time: 10/13/2023 9:26 AM  Performed by: Ginger Carne, CRNAPre-anesthesia Checklist: Patient identified, Emergency Drugs available, Suction available, Patient being monitored and Timeout performed Patient Re-evaluated:Patient Re-evaluated prior to induction Oxygen Delivery Method: Nasal cannula Preoxygenation: Pre-oxygenation with 100% oxygen Induction Type: IV induction

## 2023-10-13 NOTE — Anesthesia Preprocedure Evaluation (Signed)
Anesthesia Evaluation  Patient identified by MRN, date of birth, ID band Patient awake    Reviewed: Allergy & Precautions, NPO status , Patient's Chart, lab work & pertinent test results  History of Anesthesia Complications (+) PONV and history of anesthetic complications  Airway Mallampati: III  TM Distance: <3 FB Neck ROM: full    Dental  (+) Chipped   Pulmonary sleep apnea , COPD, former smoker   Pulmonary exam normal        Cardiovascular Exercise Tolerance: Good hypertension, (-) angina Normal cardiovascular exam     Neuro/Psych  PSYCHIATRIC DISORDERS      negative neurological ROS     GI/Hepatic negative GI ROS, Neg liver ROS,,,  Endo/Other  Hypothyroidism    Renal/GU Renal disease  negative genitourinary   Musculoskeletal   Abdominal   Peds  Hematology negative hematology ROS (+)   Anesthesia Other Findings Past Medical History: 2010: Aortic dissection (HCC) No date: COPD (chronic obstructive pulmonary disease) (HCC) No date: Depression No date: Hypertension No date: Hypothyroidism No date: Pneumonia No date: PONV (postoperative nausea and vomiting) No date: Rosacea No date: Seasonal allergies No date: Sleep apnea     Comment:  Does not use CPAP  Past Surgical History: 2007: ABDOMINAL HYSTERECTOMY 06/2009: AORTIC VALVE REPLACEMENT     Comment:  repeated 2017 1985: APPENDECTOMY 11/2020: BREAST BIOPSY; Right     Comment:  benign No date: BREAST CYST ASPIRATION     Comment:  does not remember any details 04/12/2013: CARPAL TUNNEL RELEASE; Left     Comment:  elbow 1994: CATARACT EXTRACTION; Right 1997-1998: FOOT SURGERY; Left 08/10/2023: IR EMBO ART  VEN HEMORR LYMPH EXTRAV  INC GUIDE ROADMAPPING 12/15/2020: ORIF ANKLE FRACTURE; Right     Comment:  Procedure: OPEN REDUCTION INTERNAL FIXATION (ORIF) RIGHT              TRIMALLEOLAR ANKLE FRACTURE;  Surgeon: [name],  MD;  Location: [facility];  Service:               Orthopedics;  Laterality: Right;     Reproductive/Obstetrics negative OB ROS                             Anesthesia Physical Anesthesia Plan  ASA: 3  Anesthesia Plan: General   Post-op Pain Management:    Induction: Intravenous  PONV Risk Score and Plan: Propofol infusion and TIVA  Airway Management Planned: Natural Airway and Nasal Cannula  Additional Equipment:   Intra-op Plan:   Post-operative Plan:   Informed Consent: I have reviewed the patients History and Physical, chart, labs and discussed the procedure including the risks, benefits and alternatives for the proposed anesthesia with the patient or authorized representative who has indicated his/her understanding and acceptance.     Dental Advisory Given  Plan Discussed with: Anesthesiologist, CRNA and Surgeon  Anesthesia Plan Comments: (Patient consented for risks of anesthesia including but not limited to:  - adverse reactions to medications - risk of airway placement if required - damage to eyes, teeth, lips or other oral mucosa - nerve damage due to positioning  - sore throat or hoarseness - Damage to heart, brain, nerves, lungs, other parts of body or loss of life  Patient voiced understanding and assent.)       Anesthesia Quick Evaluation

## 2023-10-14 ENCOUNTER — Encounter: Payer: Self-pay | Admitting: Gastroenterology

## 2023-10-16 ENCOUNTER — Encounter: Payer: Self-pay | Admitting: Gastroenterology

## 2023-10-16 LAB — SURGICAL PATHOLOGY

## 2023-10-20 ENCOUNTER — Ambulatory Visit
Admission: RE | Admit: 2023-10-20 | Discharge: 2023-10-20 | Disposition: A | Discharge status: Home / Self Care | Payer: 59 | Source: Ambulatory Visit | Attending: Family Medicine | Admitting: Family Medicine

## 2023-10-20 DIAGNOSIS — Z1231 Encounter for screening mammogram for malignant neoplasm of breast: Secondary | ICD-10-CM | POA: Insufficient documentation

## 2023-10-28 ENCOUNTER — Telehealth: Payer: Self-pay | Admitting: *Deleted

## 2023-10-28 NOTE — Transitions of Care (Post Inpatient/ED Visit) (Signed)
   10/28/2023  Name: ESTALENE KALU MRN: [id number] DOB: 1961-04-24  Today's TOC FU Call Status: Today's TOC FU Call Status:: Unsuccessful Call (2nd Attempt) Unsuccessful Call (2nd Attempt) Date: 10/28/23  Attempted to reach the patient regarding the most recent Inpatient/ED visit.  Follow Up Plan: Additional outreach attempts will be made to reach the patient to complete the Transitions of Care (Post Inpatient/ED visit) call.   Gean Maidens BSN RN Population Health- Transition of Care Team.  [facility] [phone]

## 2023-10-28 NOTE — Transitions of Care (Post Inpatient/ED Visit) (Signed)
   10/28/2023  Name: Nancy [name] MRN: [id number] DOB: October 11, 1961  Today's TOC FU Call Status: Today's TOC FU Call Status:: Unsuccessful Call (1st Attempt) Unsuccessful Call (1st Attempt) Date: 10/28/23  Attempted to reach the patient regarding the most recent Inpatient/ED visit.  Follow Up Plan: Additional outreach attempts will be made to reach the patient to complete the Transitions of Care (Post Inpatient/ED visit) call.   Gean Maidens BSN RN Population Health- Transition of Care Team.  [facility] [phone]

## 2023-10-29 ENCOUNTER — Telehealth: Payer: Self-pay

## 2023-10-29 NOTE — Transitions of Care (Post Inpatient/ED Visit) (Signed)
   10/29/2023  Name: Nancy [name] MRN: [id number] DOB: 02/21/1961  Today's TOC FU Call Status: Today's TOC FU Call Status:: Unsuccessful Call (3rd Attempt) Unsuccessful Call (3rd Attempt) Date: 10/29/23  Attempted to reach the patient regarding the most recent Inpatient/ED visit.  Follow Up Plan: No further outreach attempts will be made at this time. We have been unable to contact the patient.  Deidre Ala, RN Medical illustrator [facility] [phone]

## 2023-11-14 ENCOUNTER — Other Ambulatory Visit
Admission: RE | Admit: 2023-11-14 | Discharge: 2023-11-14 | Disposition: A | Discharge status: Home / Self Care | Payer: 59 | Source: Ambulatory Visit | Attending: Cardiology | Admitting: Cardiology

## 2023-11-14 DIAGNOSIS — Z952 Presence of prosthetic heart valve: Secondary | ICD-10-CM | POA: Diagnosis present

## 2023-11-14 LAB — PROTIME-INR
INR: 2.1 — ABNORMAL HIGH (ref 0.8–1.2)
Prothrombin Time: 23.8 s — ABNORMAL HIGH (ref 11.4–15.2)

## 2023-12-10 ENCOUNTER — Telehealth: Payer: Self-pay

## 2023-12-10 NOTE — Transitions of Care (Post Inpatient/ED Visit) (Signed)
   12/10/2023  Name: Nancy [name] MRN: [id number] DOB: 1961/01/06  Today's TOC FU Call Status: Today's TOC FU Call Status:: Unsuccessful Call (1st Attempt) Unsuccessful Call (1st Attempt) Date: 12/10/23  Attempted to reach the patient regarding the most recent Inpatient/ED visit.  Follow Up Plan: Additional outreach attempts will be made to reach the patient to complete the Transitions of Care (Post Inpatient/ED visit) call.   Deidre Ala, BSN, RN [place]  [facility] [employer] [facility] RN Care Manager [phone]

## 2023-12-11 ENCOUNTER — Telehealth: Payer: Self-pay

## 2023-12-11 NOTE — Transitions of Care (Post Inpatient/ED Visit) (Signed)
12/11/2023  Name: Nancy [name] MRN: [id number] DOB: 1961/05/29  Today's TOC FU Call Status: Today's TOC FU Call Status:: Successful TOC FU Call Completed TOC FU Call Complete Date: 12/11/23 Patient's Name and Date of Birth confirmed.  Transition Care Management Follow-up Telephone Call Date of Discharge: 12/09/23 Name of Other (Non[facility]) Discharge Facility: [facility] Type of Discharge: Inpatient Admission Primary Inpatient Discharge Diagnosis:: COPD, severe How have you been since you were released from the hospital?: Better Any questions or concerns?: No  Items Reviewed: Did you receive and understand the discharge instructions provided?: Yes Medications obtained,verified, and reconciled?: Yes (Medications Reviewed) Any new allergies since your discharge?: No Dietary orders reviewed?: No Do you have support at home?: Yes People in Home: sibling(s)  Medications Reviewed Today: Medications Reviewed Today     Reviewed by Earlie Server, RN (Registered Nurse) on 12/11/23 at 1217  Med List Status: <None>   Medication Order Taking? Sig Documenting Provider Last Dose Status Informant  acetaminophen (TYLENOL) 500 MG tablet [id number] Yes Take 500 mg by mouth every 4 (four) hours as needed. [provider] Taking Active   albuterol (VENTOLIN HFA) 108 (90 Base) MCG/ACT inhaler [id number] Yes SMARTSIG:2 Inhalation Via Inhaler Every 6 Hours PRN [provider] Taking Active   amLODipine (NORVASC) 5 MG tablet [id number] Yes Take 5 mg by mouth daily. [provider] Taking Active Self           Med Note Worthy Rancher, Zettie Pho   Fri Dec 15, 2020  3:32 PM)    doxycycline (PERIOSTAT) 20 MG tablet [id number] Yes Take one tab po BID with food. Deirdre Evener, MD Taking Active   DULoxetine (CYMBALTA) 20 MG capsule [id number] Yes Take 2 capsules (40 mg total) by mouth 2 (two) times daily.  Patient taking differently: Take 40-60 mg by mouth 2 (two) times daily. 60 mg am, 40 mg hs    Merita Norton T, FNP Taking Active Self  enoxaparin (LOVENOX) 40 MG/0.4ML injection [id number] Yes Inject 90 mg into the skin daily. [provider] Taking Active   furosemide (LASIX) 40 MG tablet [id number] Yes Take 40 mg by mouth 2 (two) times daily. [provider] Taking Active Self           Med Note Worthy Rancher, Zettie Pho   Fri Dec 15, 2020  3:33 PM)    ipratropium-albuterol (DUONEB) 0.5-2.5 (3) MG/3ML nebulizer solution 3 mL [id number]   Trey Sailors, PA-C  Active   ipratropium-albuterol (DUONEB) 0.5-2.5 (3) MG/3ML Criss Rosales [id number] Yes Inhale into the lungs. [provider] Taking Active   levothyroxine (SYNTHROID) 125 MCG tablet [id number] Yes Take 1 tablet (125 mcg total) by mouth daily before breakfast. Jacky Kindle, FNP Taking Active   metoprolol succinate (TOPROL-XL) 25 MG 24 hr tablet [id number] Yes Take 25 mg by mouth daily. [provider] Taking Active Self  ondansetron (ZOFRAN ODT) 4 MG disintegrating tablet [id number] Yes Take 1 tablet (4 mg total) by mouth every 8 (eight) hours as needed for nausea or vomiting. Jacky Kindle, FNP Taking Active   oxyCODONE (OXY IR/ROXICODONE) 5 MG immediate release tablet [id number] Yes Take 1 tablet (5 mg total) by mouth every 6 (six) hours as needed for moderate pain. Tresa Moore, MD Taking Active   polyethylene glycol (MIRALAX / GLYCOLAX) 17 g packet [id number] Yes Take 17 g by mouth daily as needed. [provider] Taking Active   senna-docusate (SENOKOT-S) 8.6-50 MG tablet [id number] Yes Take 1 tablet by  mouth 2 (two) times daily as needed for mild constipation. Tresa Moore, MD Taking Active   TRELEGY ELLIPTA 100-62.5-25 MCG/ACT AEPB [id number] Yes Inhale into the lungs. [provider] Taking Active   valACYclovir (VALTREX) 1000 MG tablet [id number] Yes  [provider] Taking Active   warfarin (COUMADIN) 5 MG tablet [id number] Yes Take 5 mg by mouth every evening. Per  cardiologist: patient supposed to take 7.5 mg 9/24 and 9/25 evening for low INR then resume 5 mg nightly  Goal INR 2.5-3.5 [provider] Taking Active Self           Med Note Worthy Rancher, Zettie Pho   Fri Dec 15, 2020  3:33 PM)              Home Care and Equipment/Supplies: Were Home Health Services Ordered?: No Any new equipment or medical supplies ordered?: No  Functional Questionnaire: Do you need assistance with bathing/showering or dressing?: No Do you need assistance with meal preparation?: No Do you need assistance with eating?: No Do you have difficulty maintaining continence: No Do you need assistance with getting out of bed/getting out of a chair/moving?: No Do you have difficulty managing or taking your medications?: No  Follow up appointments reviewed: PCP Follow-up appointment confirmed?: No MD Provider Line Number:[phone] Given: No Specialist Hospital Follow-up appointment confirmed?: Yes Date of Specialist follow-up appointment?: 12/15/23 Follow-Up Specialty Provider:: [facility] Do you need transportation to your follow-up appointment?: No Do you understand care options if your condition(s) worsen?: Yes-patient verbalized understanding  SDOH Interventions Today    Flowsheet Row Most Recent Value  SDOH Interventions   Food Insecurity Interventions Intervention Not Indicated  Housing Interventions Intervention Not Indicated  Transportation Interventions Intervention Not Indicated  Utilities Interventions Intervention Not Indicated      Interventions Today    Flowsheet Row Most Recent Value  Chronic Disease   Chronic disease during today's visit Chronic Obstructive Pulmonary Disease (COPD)  General Interventions   General Interventions Discussed/Reviewed General Interventions Discussed, General Interventions Reviewed  Education Interventions   Education Provided Provided Education  [Patient is undecided about PCP. Reports her PCP left  the practice and she is not sure what she will do in the future. I offered the referral line and patient declined.]  Pharmacy Interventions   Pharmacy Dicussed/Reviewed Medications and their functions       TOC Interventions Today    Flowsheet Row Most Recent Value  TOC Interventions   TOC Interventions Discussed/Reviewed TOC Interventions Discussed, TOC Interventions Reviewed, Post discharge activity limitations per provider, S/S of infection       Patient reports that she is doing well since hospital discharge. Reports no concerns and no needs today. Offered 30 day TOC program and patient declined. Provided my contact number for patient to call me if needed. Lonia Chimera, RN, BSN, CEN [employer]- Transition of Care Team.  [facility] [phone]

## 2023-12-24 ENCOUNTER — Other Ambulatory Visit: Payer: Self-pay

## 2023-12-24 ENCOUNTER — Encounter: Payer: 59 | Attending: Specialist

## 2023-12-24 DIAGNOSIS — J449 Chronic obstructive pulmonary disease, unspecified: Secondary | ICD-10-CM

## 2023-12-24 NOTE — Progress Notes (Signed)
Virtual Visit completed. Patient informed on EP and RD appointment and 6 Minute walk test. Patient also informed of patient health questionnaires on My Chart. Patient Verbalizes understanding. Visit diagnosis can be found in [facility] 12/08/2023.

## 2023-12-26 ENCOUNTER — Emergency Department
Admission: EM | Admit: 2023-12-26 | Discharge: 2023-12-26 | Disposition: A | Discharge status: Home / Self Care | Payer: 59 | Source: Home / Self Care

## 2023-12-29 ENCOUNTER — Ambulatory Visit: Payer: 59

## 2023-12-29 ENCOUNTER — Encounter: Payer: Self-pay | Admitting: Family Medicine

## 2023-12-29 ENCOUNTER — Ambulatory Visit: Payer: Self-pay | Admitting: *Deleted

## 2023-12-29 ENCOUNTER — Ambulatory Visit: Payer: 59 | Admitting: Family Medicine

## 2023-12-29 ENCOUNTER — Ambulatory Visit
Admission: RE | Admit: 2023-12-29 | Discharge: 2023-12-29 | Disposition: A | Discharge status: Home / Self Care | Payer: 59 | Source: Ambulatory Visit | Attending: Family Medicine | Admitting: Family Medicine

## 2023-12-29 VITALS — BP 150/74 | HR 104 | Temp 100.7°F | Resp 20 | Ht 67.5 in | Wt 198.0 lb

## 2023-12-29 DIAGNOSIS — J441 Chronic obstructive pulmonary disease with (acute) exacerbation: Secondary | ICD-10-CM | POA: Diagnosis not present

## 2023-12-29 DIAGNOSIS — R0602 Shortness of breath: Secondary | ICD-10-CM

## 2023-12-29 DIAGNOSIS — R0781 Pleurodynia: Secondary | ICD-10-CM

## 2023-12-29 DIAGNOSIS — R051 Acute cough: Secondary | ICD-10-CM | POA: Diagnosis not present

## 2023-12-29 LAB — POC COVID19 BINAXNOW: SARS Coronavirus 2 Ag: NEGATIVE

## 2023-12-29 LAB — POCT INFLUENZA A/B
Influenza A, POC: NEGATIVE
Influenza B, POC: NEGATIVE

## 2023-12-29 MED ORDER — BENZONATATE 200 MG PO CAPS
200.0000 mg | ORAL_CAPSULE | Freq: Two times a day (BID) | ORAL | 0 refills | Status: AC | PRN
Start: 2023-12-29 — End: ?

## 2023-12-29 MED ORDER — PREDNISONE 10 MG PO TABS: ORAL_TABLET | ORAL | 0 refills | Status: AC

## 2023-12-29 MED ORDER — LEVOFLOXACIN 750 MG PO TABS: 750.0000 mg | ORAL_TABLET | Freq: Every day | ORAL | 0 refills | Status: AC

## 2023-12-29 NOTE — Telephone Encounter (Signed)
Message from Cornish L sent at 12/29/2023 12:10 PM EST  Summary: rx question   Pt prescribed levoquin 750mg  by Dr. Sherrie Mustache, pt is also showing as prescribed warfarin in pharmacy system. Pharmacist calling to get okay as it is showing up as a drug interaction.  Requesting callback: [phone]          Call History  Contact Date/Time Type Contact Phone/Fax By  12/29/2023 12:05 PM EST Phone (Incoming) [facility] [phone] Salley Hews, Dannielle Burn   Reason for Disposition  [1] Pharmacy calling with prescription question AND [2] triager unable to answer question  Answer Assessment - Initial Assessment Questions 1. NAME of MEDICINE: "What medicine(s) are you calling about?"     [facility] calling regarding the levoquin 750 mg and warfarin 2. QUESTION: "What is your question?" (e.g., double dose of medicine, side effect)     Per pharmacist this is showing up an a drug interaction between these 2 medications.    3. PRESCRIBER: "Who prescribed the medicine?" Reason: if prescribed by specialist, call should be referred to that group.     Dr. Sherrie Mustache 4. SYMPTOMS: "Do you have any symptoms?" If Yes, ask: "What symptoms are you having?"  "How bad are the symptoms (e.g., mild, moderate, severe)     N/A 5. PREGNANCY:  "Is there any chance that you are pregnant?" "When was your last menstrual period?"     N/A  Protocols used: Medication Question Call-A-AH

## 2023-12-29 NOTE — Progress Notes (Signed)
Established patient visit   Patient: Nancy [name]   DOB: November 03, 1961   63 y.o. Female  MRN: [id number] Visit Date: 12/29/2023  Today's healthcare provider: Mila Merry, MD   Chief Complaint  Patient presents with   Cough    Patient with history of COPD presents with complaint of cough and increased SOB for 6 days.  She also complains on right sided pain for a week.  Side pain is worse with cough and deep breaths.  She has Albuterol inhaler but states she has not used it in a few days as she does not think it helps.   Subjective    Discussed the use of AI scribe software for clinical note transcription with the patient, who gave verbal consent to proceed.  History of Present Illness   Nancy [name] is a 63 year old female with COPD who presents with cough and side pain.  She has been experiencing a persistent cough for 5 days, which is severe enough to cause discomfort in her side and affects her sleep. She has been using Tylenol to manage the side pain, which she believes may have masked any fever. She has experienced some vomiting and diarrhea, and notes a strange smell to her urine. No fever or chills.  She has a history of COPD followed by Dr. Meredeth Ide and reports increased difficulty in catching her breath since the onset of the cough. She uses Trelegy daily and an albuterol inhaler, but finds that DuoNeb does not provide significant relief. She last used DuoNeb on Saturday. She uses oxygen at two liters per night but not during the day. Her oxygen saturation was 85% when walking but remains around 93-94% on room air when sitting. She has not had the pneumonia vaccine and has had pneumonia in the past. She takes doxycycline for rosacea and is unsure if she has taken Levaquin before. No allergies to antibiotics.       Medications: Outpatient Medications Prior to Visit  Medication Sig   acetaminophen (TYLENOL) 500 MG tablet Take 500 mg by mouth every 4 (four) hours as needed.    albuterol (VENTOLIN HFA) 108 (90 Base) MCG/ACT inhaler SMARTSIG:2 Inhalation Via Inhaler Every 6 Hours PRN   amLODipine (NORVASC) 5 MG tablet Take 5 mg by mouth daily.   doxycycline (PERIOSTAT) 20 MG tablet Take one tab po BID with food.   DULoxetine (CYMBALTA) 20 MG capsule Take 2 capsules (40 mg total) by mouth 2 (two) times daily. (Patient taking differently: Take 40-60 mg by mouth 2 (two) times daily. 60 mg am, 40 mg hs)   enoxaparin (LOVENOX) 100 MG/ML injection Inject into the skin. (Patient not taking: Reported on 12/24/2023)   enoxaparin (LOVENOX) 40 MG/0.4ML injection Inject 90 mg into the skin daily. (Patient not taking: Reported on 12/24/2023)   Fluticasone-Umeclidin-Vilant (TRELEGY ELLIPTA) 100-62.5-25 MCG/ACT AEPB Inhale into the lungs.   furosemide (LASIX) 40 MG tablet Take 40 mg by mouth 2 (two) times daily. (Patient not taking: Reported on 12/24/2023)   furosemide (LASIX) 40 MG tablet Take 1 tablet by mouth 2 (two) times daily.   ipratropium-albuterol (DUONEB) 0.5-2.5 (3) MG/3ML SOLN Inhale into the lungs.   levothyroxine (SYNTHROID) 125 MCG tablet Take 1 tablet (125 mcg total) by mouth daily before breakfast.   metoprolol succinate (TOPROL-XL) 25 MG 24 hr tablet Take 25 mg by mouth daily.   Na Sulfate-K Sulfate-Mg Sulfate concentrate 17.5-3.13-1.6 GM/177ML SOLN  (Patient not taking: Reported on 12/24/2023)   ondansetron (  ZOFRAN ODT) 4 MG disintegrating tablet Take 1 tablet (4 mg total) by mouth every 8 (eight) hours as needed for nausea or vomiting.   oxyCODONE (OXY IR/ROXICODONE) 5 MG immediate release tablet Take 1 tablet (5 mg total) by mouth every 6 (six) hours as needed for moderate pain.   polyethylene glycol (MIRALAX / GLYCOLAX) 17 g packet Take 17 g by mouth daily as needed.   senna-docusate (SENOKOT-S) 8.6-50 MG tablet Take 1 tablet by mouth 2 (two) times daily as needed for mild constipation.   TRELEGY ELLIPTA 100-62.5-25 MCG/ACT AEPB Inhale into the lungs.   valACYclovir  (VALTREX) 1000 MG tablet    warfarin (COUMADIN) 5 MG tablet Take 5 mg by mouth every evening. Per cardiologist: patient supposed to take 7.5 mg 9/24 and 9/25 evening for low INR then resume 5 mg nightly  Goal INR 2.5-3.5   Facility-Administered Medications Prior to Visit  Medication Dose Route Frequency Provider   ipratropium-albuterol (DUONEB) 0.5-2.5 (3) MG/3ML nebulizer solution 3 mL  3 mL Nebulization Once Pollak, Adriana M, PA-C      Objective    BP (!) 150/74 (BP Location: Right Arm, Patient Position: Sitting, Cuff Size: Normal)   Pulse (!) 104   Temp (!) 100.7 F (38.2 C) (Oral)   Resp 20   Ht 5' 7.5" (1.715 m)   Wt 198 lb (89.8 kg)   SpO2 94% Comment: room air, at rest  BMI 30.55 kg/m  Vitals:   12/29/23 1130 12/29/23 1132 12/29/23 1159  BP: (!) 150/74    Pulse: (!) 104    Resp: 20    Temp: (!) 100.7 F (38.2 C)    TempSrc: Oral    SpO2: (!) 85% (r/a after ambulating) 99% (2lpm O2) 94% (r/a at rest)  Weight: 198 lb (89.8 kg)    Height: 5' 7.5" (1.715 m)      Physical Exam   General: Appearance:    Mildly obese female in no acute distress  Eyes:    PERRL, conjunctiva/corneas clear, EOM's intact       Lungs:     Diffuse wheezing, faint left lower rales. Good air movement. Slightly tachypnic  Heart:    Tachycardic. Normal rhythm.   MS:   All extremities are intact.    Neurologic:   Awake, alert, oriented x 3. No apparent focal neurological defect.        Results for orders placed or performed in visit on 12/29/23  POC COVID-19  Result Value Ref Range   SARS Coronavirus 2 Ag Negative Negative  POCT Influenza A/B  Result Value Ref Range   Influenza A, POC Negative Negative   Influenza B, POC Negative Negative     Assessment & Plan        COPD Exacerbation Increased cough and shortness of breath. Currently on Trelegy and Albuterol inhaler. DuoNeb not effective. -Continue Trelegy and Albuterol inhaler. -Start Prednisone taper. -Start Tessalon for  cough. -She does have home oxygent which she uses at night, but could use during day with exertion.   Rule out Pneumonia Fever, cough, and abnormal breath sounds on examination. -Order chest x-ray at [facility] imaging center. -Start Levaquin.  Rib pain Right-sided abdominal pain for over a week, associated with vomiting and diarrhea. -Order abdominal x-ray along with chest x-ray at [facility] imaging center.  General Health Maintenance -Consider Pneumonia vaccine in the future.   ER if worsens or not rapidly improving.         Mila Merry, MD  [facility] [phone] (phone) [phone] (fax)  [facility]

## 2023-12-29 NOTE — Telephone Encounter (Addendum)
Am aware of potential interaction. She needs to start Levaquin.

## 2023-12-29 NOTE — Telephone Encounter (Signed)
  Chief Complaint: [facility] calling because they are getting an interaction warning between the Levoquin 750 and warfarin.   Levoquin ordered by Dr. Sherrie Mustache. Symptoms: N/A Frequency: N/A Pertinent Negatives: Patient denies N/A Disposition: [] ED /[] Urgent Care (no appt availability in office) / [] Appointment(In office/virtual)/ []  [place] [facility]/ [] Home Care/ [] Refused Recommended Disposition /[] [place] [facility]/ [x]  Follow-up with PCP Additional Notes: Message sent to Dr. Sherrie Mustache since he ordered the Levoquin 750 mg.

## 2023-12-30 ENCOUNTER — Encounter: Payer: Self-pay | Admitting: Radiology

## 2023-12-30 ENCOUNTER — Other Ambulatory Visit: Payer: Self-pay | Admitting: Family Medicine

## 2023-12-30 ENCOUNTER — Encounter: Payer: Self-pay | Admitting: Family Medicine

## 2023-12-30 ENCOUNTER — Telehealth: Payer: Self-pay | Admitting: Family Medicine

## 2023-12-30 DIAGNOSIS — R918 Other nonspecific abnormal finding of lung field: Secondary | ICD-10-CM

## 2023-12-30 NOTE — Telephone Encounter (Signed)
Pt states that she had an appointment with Dr. Sherrie Mustache yesterday and he prescribed an antibiotic for her: levofloxacin (LEVAQUIN) 750 MG tablet  Pt states when she went to the pharmacy to pick up her prescription the Pharmacists advised her that this medication can have a reaction with her blood thinner: warfarin (COUMADIN) 5 MG tablet   Pt is wanting something else called in for her. Please advise/call pt back to discuss.     [facility] - [place], [place] - [address]  Phone: [phone] Fax: [phone]

## 2023-12-30 NOTE — Telephone Encounter (Signed)
Called patient and advised to start taking Levaquin per Dr. Sherrie Mustache.  FYI-Please see other telephone encounter. Thanks.

## 2023-12-30 NOTE — Telephone Encounter (Signed)
I have spoken to patient. Please see other telephone encounter and see Dr. Theodis Aguas recommendations.

## 2023-12-30 NOTE — Telephone Encounter (Signed)
Called [facility] and gave the ok per Dr. Theodis Aguas message.

## 2024-01-01 ENCOUNTER — Ambulatory Visit
Admission: RE | Admit: 2024-01-01 | Discharge: 2024-01-01 | Disposition: A | Discharge status: Home / Self Care | Payer: 59 | Source: Ambulatory Visit | Attending: Family Medicine | Admitting: Family Medicine

## 2024-01-01 DIAGNOSIS — R918 Other nonspecific abnormal finding of lung field: Secondary | ICD-10-CM

## 2024-01-01 MED ORDER — IOPAMIDOL (ISOVUE-370) INJECTION 76%
75.0000 mL | Freq: Once | INTRAVENOUS | Status: AC | PRN
  Administered 2024-01-01: 65 mL via INTRAVENOUS

## 2024-01-05 ENCOUNTER — Ambulatory Visit: Payer: 59

## 2024-01-12 ENCOUNTER — Emergency Department
Admission: EM | Admit: 2024-01-12 | Discharge: 2024-01-13 | Disposition: A | Discharge status: Home / Self Care | Attending: Emergency Medicine | Admitting: Emergency Medicine

## 2024-01-12 ENCOUNTER — Other Ambulatory Visit: Payer: Self-pay

## 2024-01-12 ENCOUNTER — Emergency Department

## 2024-01-12 DIAGNOSIS — S29012A Strain of muscle and tendon of back wall of thorax, initial encounter: Secondary | ICD-10-CM | POA: Insufficient documentation

## 2024-01-12 DIAGNOSIS — E039 Hypothyroidism, unspecified: Secondary | ICD-10-CM | POA: Diagnosis not present

## 2024-01-12 DIAGNOSIS — J449 Chronic obstructive pulmonary disease, unspecified: Secondary | ICD-10-CM | POA: Diagnosis not present

## 2024-01-12 DIAGNOSIS — I10 Essential (primary) hypertension: Secondary | ICD-10-CM | POA: Insufficient documentation

## 2024-01-12 DIAGNOSIS — R0602 Shortness of breath: Secondary | ICD-10-CM | POA: Diagnosis not present

## 2024-01-12 DIAGNOSIS — X58XXXA Exposure to other specified factors, initial encounter: Secondary | ICD-10-CM | POA: Diagnosis not present

## 2024-01-12 DIAGNOSIS — M549 Dorsalgia, unspecified: Secondary | ICD-10-CM

## 2024-01-12 DIAGNOSIS — D72829 Elevated white blood cell count, unspecified: Secondary | ICD-10-CM | POA: Insufficient documentation

## 2024-01-12 LAB — BASIC METABOLIC PANEL
Anion gap: 14 (ref 5–15)
BUN: 17 mg/dL (ref 6–20)
CO2: 25 mmol/L (ref 22–32)
Calcium: 9.1 mg/dL (ref 8.9–10.3)
Chloride: 97 mmol/L — ABNORMAL LOW (ref 98–111)
Creatinine, Ser: 0.61 mg/dL (ref 0.44–1.00)
GFR, Estimated: >60 mL/min (ref >60)
Glucose, Bld: 85 mg/dL (ref 70–99)
Potassium: 3.4 mmol/L — ABNORMAL LOW (ref 3.5–5.1)
Sodium: 136 mmol/L (ref 135–145)

## 2024-01-12 LAB — CBC WITH DIFFERENTIAL/PLATELET
Abs Immature Granulocytes: 0.06 10*3/uL (ref 0.00–0.07)
Basophils Absolute: 0.1 10*3/uL (ref 0.0–0.1)
Basophils Relative: 1 %
Eosinophils Absolute: 0.2 10*3/uL (ref 0.0–0.5)
Eosinophils Relative: 2 %
HCT: 34.1 % — ABNORMAL LOW (ref 36.0–46.0)
Hemoglobin: 11.6 g/dL — ABNORMAL LOW (ref 12.0–15.0)
Immature Granulocytes: 1 %
Lymphocytes Relative: 8 %
Lymphs Abs: 0.9 10*3/uL (ref 0.7–4.0)
MCH: 30.6 pg (ref 26.0–34.0)
MCHC: 34 g/dL (ref 30.0–36.0)
MCV: 90 fL (ref 80.0–100.0)
Monocytes Absolute: 0.8 10*3/uL (ref 0.1–1.0)
Monocytes Relative: 8 %
Neutro Abs: 8.5 10*3/uL — ABNORMAL HIGH (ref 1.7–7.7)
Neutrophils Relative %: 80 %
Platelets: 275 10*3/uL (ref 150–400)
RBC: 3.79 MIL/uL — ABNORMAL LOW (ref 3.87–5.11)
RDW: 13.1 % (ref 11.5–15.5)
WBC: 10.6 10*3/uL — ABNORMAL HIGH (ref 4.0–10.5)
nRBC: 0 % (ref 0.0–0.2)

## 2024-01-12 LAB — BRAIN NATRIURETIC PEPTIDE: B Natriuretic Peptide: 99.9 pg/mL (ref 0.0–100.0)

## 2024-01-12 LAB — PROTIME-INR
INR: 1.1 (ref 0.8–1.2)
Prothrombin Time: 14.5 s (ref 11.4–15.2)

## 2024-01-12 LAB — RESP PANEL BY RT-PCR (RSV, FLU A&B, COVID)  RVPGX2
Influenza A by PCR: NEGATIVE
Influenza B by PCR: NEGATIVE
Resp Syncytial Virus by PCR: NEGATIVE
SARS Coronavirus 2 by RT PCR: NEGATIVE

## 2024-01-12 LAB — TROPONIN I (HIGH SENSITIVITY)
Troponin I (High Sensitivity): 6 ng/L (ref <18)
Troponin I (High Sensitivity): 8 ng/L (ref <18)

## 2024-01-12 MED ORDER — IOHEXOL 350 MG/ML SOLN
75.0000 mL | Freq: Once | INTRAVENOUS | Status: AC | PRN
  Administered 2024-01-12: 75 mL via INTRAVENOUS

## 2024-01-12 MED ORDER — LACTATED RINGERS IV BOLUS
500.0000 mL | Freq: Once | INTRAVENOUS | Status: AC
  Administered 2024-01-12: 500 mL via INTRAVENOUS

## 2024-01-12 MED ORDER — IOHEXOL 350 MG/ML SOLN: 75.0000 mL | Freq: Once | INTRAVENOUS | Status: DC | PRN

## 2024-01-12 MED ORDER — MORPHINE SULFATE (PF) 4 MG/ML IV SOLN
4.0000 mg | Freq: Once | INTRAVENOUS | Status: AC
  Administered 2024-01-12: 4 mg via INTRAVENOUS
  Filled 2024-01-12: qty 1

## 2024-01-12 MED ORDER — IPRATROPIUM-ALBUTEROL 0.5-2.5 (3) MG/3ML IN SOLN: 3.0000 mL | Freq: Once | RESPIRATORY_TRACT | Status: DC

## 2024-01-12 NOTE — ED Provider Triage Note (Signed)
 Emergency Medicine Provider Triage Evaluation Note  Nancy [name] , a 63 y.o. female  was evaluated in triage.  Pt complains of who presents today with right posterior thorax pain.  Patient states she had a procedure reduced lung capacity on her lungs to acute COPD but the procedure failed.  States she had a CT scan showing 2 hematomas, pain increase, shortness of breath.  Patient denies fever  Review of Systems  Positive:  Negative:   Physical Exam  BP (!) 156/70 (BP Location: Left Arm)   Pulse (!) 121   Temp 98 F (36.7 C) (Oral)   Resp (!) 24   SpO2 95%  Gen:   Awake, no distress   Resp:  Normal effort, no wheezing MSK:   Moves extremities without difficulty  Other:  Right posterior thorax tender to palpation at the level of 7 rib.  Medical Decision Making  Medically screening exam initiated at 6:53 PM.  Appropriate orders placed.  Nancy [name] was informed that the remainder of the evaluation will be completed by another provider, this initial triage assessment does not replace that evaluation, and the importance of remaining in the ED until their evaluation is complete.     Gladys Damme, PA-C 01/12/24 [phone]

## 2024-01-12 NOTE — ED Triage Notes (Addendum)
 Pt sts that she is having right back stabbing pain. Pt is very tear full while in triage. Pt is having difficulty ambulating and breathing due to the pain with each breath. Pt sts that she had a lung biopsy there recently and has been in pain since. Provider is in triage room with pt.

## 2024-01-12 NOTE — ED Provider Notes (Signed)
 [facility] Provider Note    Event Date/Time   First MD Initiated Contact with Patient 01/12/24 2204     (approximate)   History   Back Pain   HPI  NKECHI LINEHAN is a 63 y.o. female history of COPD, hypertension, hypothyroidism, OSA, prior history of urine infection s/p repair, here for right upper back pain.  As well as shortness of breath and cough.  States that on January 27 she had a lung reduction surgery that was done that was aborted because he could not get past the scarring in her tissue.  States that she has one of the surgical site that still has a little bit of drainage that is blood-tinged but nonpurulent.  The rest of her surgical sites have closed.  States that she has been coughing for the last couple days, is unsure if she tore strained a muscle in her back today when she was coughing.  States that she also feels a little bit worsening shortness of breath.  Is on Coumadin for mechanical valve.  Has been compliant with medications.  She denies any fever, anterior chest pain, unilateral calf swelling or tenderness, no nausea, vomiting, diarrhea, abdominal pain.  On independent chart review, she had a CT scan that was done on the 20th that showed anterior right middle lobe subpleural mass or loculated collection versus hematoma.  Went to see a pulmonologist on 2/24.  Had also completed a course of Levaquin for presumed pneumonia that was prescribed by her primary doctor in early February.   Physical Exam   Triage Vital Signs: ED Triage Vitals  Encounter Vitals Group     BP 01/12/24 1852 (!) 156/70     Systolic BP Percentile --      Diastolic BP Percentile --      Pulse Rate 01/12/24 1852 (!) 121     Resp 01/12/24 1852 (!) 24     Temp 01/12/24 1852 98 F (36.7 C)     Temp Source 01/12/24 1852 Oral     SpO2 01/12/24 1852 95 %     Weight 01/12/24 1853 196 lb (88.9 kg)     Height 01/12/24 1853 5\' 7"  (1.702 m)     Head Circumference --       Peak Flow --      Pain Score 01/12/24 1853 10     Pain Loc --      Pain Education --      Exclude from Growth Chart --     Most recent vital signs: Vitals:   01/12/24 2230 01/12/24 2300  BP:  (!) 148/77  Pulse: 95 91  Resp:  20  Temp:  98 F (36.7 C)  SpO2: 99% 98%     General: Awake, no distress.  CV:  Good peripheral perfusion.  Resp:  Normal effort.  Diminished breath sounds bilaterally.  Tachypneic Abd:  No distention.  Soft nontender Other:  No unilateral calf swelling or tenderness, she has tenderness to her right upper back inferior to her scapula.  She has 2 surgical sites, 1 at her back on the right and 1 on her right flank, the one on the right flank is closed without any drainage or overlying erythema or tenderness, the other surgical site is on her right back, there is some dehiscence, there is no active drainage but there is some dried blood on the Band-Aid.  She has no overlying erythema, no tenderness to palpation to the surgical sites.  ED Results / Procedures / Treatments   Labs (all labs ordered are listed, but only abnormal results are displayed) Labs Reviewed  BASIC METABOLIC PANEL - Abnormal; Notable for the following components:      Result Value   Potassium 3.4 (*)    Chloride 97 (*)    All other components within normal limits  CBC WITH DIFFERENTIAL/PLATELET - Abnormal; Notable for the following components:   WBC 10.6 (*)    RBC 3.79 (*)    Hemoglobin 11.6 (*)    HCT 34.1 (*)    Neutro Abs 8.5 (*)    All other components within normal limits  RESP PANEL BY RT-PCR (RSV, FLU A&B, COVID)  RVPGX2  PROTIME-INR  BRAIN NATRIURETIC PEPTIDE  TROPONIN I (HIGH SENSITIVITY)  TROPONIN I (HIGH SENSITIVITY)     EKG  Sinus tachycardia, rate 103, when QRS, normal QTc, intraventricular conduction delay, baseline is wandering but no obvious ischemic ST elevation, this is change compared to prior   RADIOLOGY Chest x-ray on my interpretation without  obvious consolidation   PROCEDURES:  Critical Care performed: No  Procedures   MEDICATIONS ORDERED IN ED: Medications  ipratropium-albuterol (DUONEB) 0.5-2.5 (3) MG/3ML nebulizer solution 3 mL (3 mLs Nebulization Not Given 01/12/24 2251)  lactated ringers bolus 500 mL (has no administration in time range)  morphine (PF) 4 MG/ML injection 4 mg (4 mg Intravenous Given 01/12/24 2227)  iohexol (OMNIPAQUE) 350 MG/ML injection 75 mL (75 mLs Intravenous Contrast Given 01/12/24 2239)     IMPRESSION / MDM / ASSESSMENT AND PLAN / ED COURSE  I reviewed the triage vital signs and the nursing notes.                              Differential diagnosis includes, but is not limited to, hematoma, muscle strain, muscle tear, pneumonia, also considered PE, ACS, viral illness such as COVID, influenza, RSV.  No wheezing on exam to suggest COPD exacerbation, considered pneumothorax but able to hear breath sounds bilaterally.  Will give her some fluids, will get a CT PE study, labs, EKG, troponin.  Offered a DuoNeb but patient declined.  Patient's presentation is most consistent with acute presentation with potential threat to life or bodily function.  Independent review of labs and imaging, electrolytes not severely deranged, mild leukocytosis, INR is 1.1, BNP and troponin are not elevated.  Signed out to overnight team pending CT PE study.  Clinical Course as of 01/12/24 2311  Mon Jan 12, 2024  2233 DG Chest 2 View IMPRESSION: Persistent but improved soft tissue density along the lateral aspect of the right lung base consistent with the known chest wall hematoma.  Chronic changes as described.   [TT]    Clinical Course User Index [TT] Jodie Echevaria, Franchot Erichsen, MD     FINAL CLINICAL IMPRESSION(S) / ED DIAGNOSES   Final diagnoses:  Upper back pain  Shortness of breath     Rx / DC Orders   ED Discharge Orders     None        Note:  This document was prepared using Dragon voice recognition  software and may include unintentional dictation errors.    Claybon Jabs, MD 01/12/24 [phone]

## 2024-01-13 DIAGNOSIS — R918 Other nonspecific abnormal finding of lung field: Secondary | ICD-10-CM | POA: Insufficient documentation

## 2024-01-13 MED ORDER — LIDOCAINE 5 % EX PTCH
1.0000 | MEDICATED_PATCH | CUTANEOUS | Status: DC
  Administered 2024-01-13: 1 via TRANSDERMAL
  Filled 2024-01-13: qty 1

## 2024-01-13 MED ORDER — LIDOCAINE 5 % EX PTCH: 1.0000 | MEDICATED_PATCH | Freq: Two times a day (BID) | CUTANEOUS | 0 refills | Status: AC

## 2024-01-13 MED ORDER — DICLOFENAC SODIUM 1 % EX GEL
2.0000 g | Freq: Four times a day (QID) | CUTANEOUS | 0 refills | Status: AC
Start: 2024-01-13 — End: ?

## 2024-01-13 NOTE — ED Provider Notes (Signed)
 Procedures  Clinical Course as of 01/13/24 0231  Mon Jan 12, 2024  2233 DG Chest 2 View IMPRESSION: Persistent but improved soft tissue density along the lateral aspect of the right lung base consistent with the known chest wall hematoma.  Chronic changes as described.   [TT]    Clinical Course User Index [TT] Claybon Jabs, MD    ----------------------------------------- 2:31 AM on 01/13/2024 ----------------------------------------- CTA negative. Pt does have exquisite tenderness to palpation in right subscapular back which reproduces her pain, c/w muscle strain. Rx voltaren, lidocaine patch. Stable for DC     Sharman Cheek, MD 01/13/24 [phone]

## 2024-01-14 ENCOUNTER — Telehealth: Payer: Self-pay

## 2024-01-14 NOTE — Transitions of Care (Post Inpatient/ED Visit) (Signed)
 01/14/2024  Name: Nancy [name] MRN: [id number] DOB: August 03, 1961  Today's TOC FU Call Status: Today's TOC FU Call Status:: Successful TOC FU Call Completed TOC FU Call Complete Date: 01/14/24 Patient's Name and Date of Birth confirmed.  Transition Care Management Follow-up Telephone Call Date of Discharge: 01/13/24 Discharge Facility: [facility]) Type of Discharge: Emergency Department Reason for ED Visit: Other: (encephalopathy) How have you been since you were released from the hospital?: Same Any questions or concerns?: No  Items Reviewed: Did you receive and understand the discharge instructions provided?: Yes Medications obtained,verified, and reconciled?: Yes (Medications Reviewed) Any new allergies since your discharge?: No Dietary orders reviewed?: Yes Do you have support at home?: No  Medications Reviewed Today: Medications Reviewed Today     Reviewed by Karena Addison, LPN (Licensed Practical Nurse) on 01/14/24 at [phone]  Med List Status: <None>   Medication Order Taking? Sig Documenting Provider Last Dose Status Informant  acetaminophen (TYLENOL) 500 MG tablet [id number] No Take 500 mg by mouth every 4 (four) hours as needed. [provider] Taking Active   albuterol (VENTOLIN HFA) 108 (90 Base) MCG/ACT inhaler [id number] No SMARTSIG:2 Inhalation Via Inhaler Every 6 Hours PRN [provider] Taking Active   amLODipine (NORVASC) 5 MG tablet [id number] No Take 5 mg by mouth daily. [provider] Taking Active Self           Med Note Worthy Rancher, Zettie Pho   Fri Dec 15, 2020  3:32 PM)    benzonatate (TESSALON) 200 MG capsule [id number]  Take 1 capsule (200 mg total) by mouth 2 (two) times daily as needed for cough. Malva Limes, MD  Active   diclofenac Sodium (VOLTAREN) 1 % GEL [id number]  Apply 2 g topically 4 (four) times daily. Sharman Cheek, MD  Active   doxycycline (PERIOSTAT) 20 MG tablet [id number] No Take one tab  po BID with food. Deirdre Evener, MD Taking Active   DULoxetine (CYMBALTA) 20 MG capsule [id number] No Take 2 capsules (40 mg total) by mouth 2 (two) times daily.  Patient taking differently: Take 40-60 mg by mouth 2 (two) times daily. 60 mg am, 40 mg hs   Merita Norton T, FNP Taking Active Self  enoxaparin (LOVENOX) 100 MG/ML injection [id number] No Inject into the skin.  Patient not taking: Reported on 12/24/2023   [provider] Not Taking Active   enoxaparin (LOVENOX) 40 MG/0.4ML injection [id number] No Inject 90 mg into the skin daily.  Patient not taking: Reported on 12/24/2023   [provider] Not Taking Active   Fluticasone-Umeclidin-Vilant (TRELEGY ELLIPTA) 100-62.5-25 MCG/ACT AEPB [id number] No Inhale into the lungs. [provider] Taking Active   furosemide (LASIX) 40 MG tablet [id number] No Take 40 mg by mouth 2 (two) times daily.  Patient not taking: Reported on 12/24/2023   [provider] Not Taking Active Self           Med Note Worthy Rancher, Zettie Pho   Fri Dec 15, 2020  3:33 PM)    furosemide (LASIX) 40 MG tablet [id number] No Take 1 tablet by mouth 2 (two) times daily. [provider] Taking Active   ipratropium-albuterol (DUONEB) 0.5-2.5 (3) MG/3ML nebulizer solution 3 mL [id number]   Osvaldo Angst M, PA-C  Active   ipratropium-albuterol (DUONEB) 0.5-2.5 (3) MG/3ML SOLN [id number] No Inhale into the lungs. [provider] Taking Active   levofloxacin (LEVAQUIN) 750 MG tablet [id number]  Take 1 tablet (750 mg total) by mouth  daily. Malva Limes, MD  Active   levothyroxine (SYNTHROID) 125 MCG tablet [id number] No Take 1 tablet (125 mcg total) by mouth daily before breakfast. Jacky Kindle, FNP Taking Active   lidocaine (LIDODERM) 5 % [id number]  Place 1 patch onto the skin every 12 (twelve) hours. Remove & Discard patch within 12 hours or as directed by MD Sharman Cheek, MD  Active   metoprolol succinate (TOPROL-XL) 25 MG 24 hr  tablet [id number] No Take 25 mg by mouth daily. [provider] Taking Active Self  Na Sulfate-K Sulfate-Mg Sulfate concentrate 17.5-3.13-1.6 GM/177ML SOLN [id number] No   Patient not taking: Reported on 12/24/2023   [provider] Not Taking Active   ondansetron (ZOFRAN ODT) 4 MG disintegrating tablet [id number] No Take 1 tablet (4 mg total) by mouth every 8 (eight) hours as needed for nausea or vomiting. Jacky Kindle, FNP Taking Active   oxyCODONE (OXY IR/ROXICODONE) 5 MG immediate release tablet [id number] No Take 1 tablet (5 mg total) by mouth every 6 (six) hours as needed for moderate pain. Tresa Moore, MD Taking Active   polyethylene glycol (MIRALAX / GLYCOLAX) 17 g packet [id number] No Take 17 g by mouth daily as needed. [provider] Taking Active   senna-docusate (SENOKOT-S) 8.6-50 MG tablet [id number] No Take 1 tablet by mouth 2 (two) times daily as needed for mild constipation. Tresa Moore, MD Taking Active   TRELEGY ELLIPTA 100-62.5-25 MCG/ACT AEPB [id number] No Inhale into the lungs. [provider] Taking Active   valACYclovir (VALTREX) 1000 MG tablet [id number] No  [provider] Taking Active   warfarin (COUMADIN) 5 MG tablet [id number] No Take 5 mg by mouth every evening. Per cardiologist: patient supposed to take 7.5 mg 9/24 and 9/25 evening for low INR then resume 5 mg nightly  Goal INR 2.5-3.5 [provider] Taking Active Self           Med Note Worthy Rancher, Zettie Pho   Fri Dec 15, 2020  3:33 PM)              Home Care and Equipment/Supplies: Were Home Health Services Ordered?: NA Any new equipment or medical supplies ordered?: NA  Functional Questionnaire: Do you need assistance with bathing/showering or dressing?: No Do you need assistance with meal preparation?: No Do you need assistance with eating?: No Do you have difficulty maintaining continence: No Do you need assistance with getting out of  bed/getting out of a chair/moving?: No Do you have difficulty managing or taking your medications?: No  Follow up appointments reviewed: PCP Follow-up appointment confirmed?: No (declined appt) MD Provider Line [phone] Given: No Specialist Hospital Follow-up appointment confirmed?: NA Do you need transportation to your follow-up appointment?: No Do you understand care options if your condition(s) worsen?: Yes-patient verbalized understanding    SIGNATURE Karena Addison, LPN [facility] Nurse Health Advisor Direct Dial [phone]

## 2024-01-19 ENCOUNTER — Other Ambulatory Visit: Payer: Self-pay | Admitting: Family Medicine

## 2024-01-19 NOTE — Telephone Encounter (Signed)
 Copied from CRM [id number]. Topic: Clinical - Medication Refill >> Jan 19, 2024 10:19 AM Shelah Lewandowsky wrote: Most Recent Primary Care Visit:  Provider: Malva Limes  Department: [facility]  Visit Type: OFFICE VISIT  Date: 12/29/2023  Medication: valACYclovir (VALTREX) 1000 MG tablet  Has the patient contacted their pharmacy? Yes (Agent: If no, request that the patient contact the pharmacy for the refill. If patient does not wish to contact the pharmacy document the reason why and proceed with request.) (Agent: If yes, when and what did the pharmacy advise?)  Is this the correct pharmacy for this prescription? Yes If no, delete pharmacy and type the correct one.  This is the patient's preferred pharmacy:  [facility] - [place], [place] - [address] [place] [address] Phone: [phone] Fax: [phone]   Has the prescription been filled recently? Yes  Is the patient out of the medication? Yes  Has the patient been seen for an appointment in the last year OR does the patient have an upcoming appointment? Yes  Can we respond through MyChart? Yes  Agent: Please be advised that Rx refills may take up to 3 business days. We ask that you follow-up with your pharmacy.

## 2024-01-20 NOTE — Telephone Encounter (Signed)
 Requested medication (s) are due for refill today: yes  Requested medication (s) are on the active medication list: yes  Last refill:  10/2022  Future visit scheduled: no  Notes to clinic:  historical med, please review for refill      Requested Prescriptions  Pending Prescriptions Disp Refills   valACYclovir (VALTREX) 1000 MG tablet       Antimicrobials:  Antiviral Agents - Anti-Herpetic Passed - 01/20/2024  1:14 PM      Passed - Valid encounter within last 12 months    Recent Outpatient Visits           5 months ago Hospital discharge follow-up   [facility] Merita Norton T, FNP   11 months ago Depression, recurrent [facility])   [facility] Merita Norton T, FNP   2 years ago Depression, recurrent [facility])    [facility] Merita Norton T, FNP   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

## 2024-01-22 MED ORDER — VALACYCLOVIR HCL 1 G PO TABS: 1000.0000 mg | ORAL_TABLET | Freq: Every day | ORAL | 0 refills | Status: DC

## 2024-02-03 ENCOUNTER — Other Ambulatory Visit: Payer: Self-pay | Admitting: Family Medicine

## 2024-02-03 DIAGNOSIS — F339 Major depressive disorder, recurrent, unspecified: Secondary | ICD-10-CM

## 2024-02-03 NOTE — Telephone Encounter (Signed)
 [facility] faxed refill request for the following medications:   DULoxetine (CYMBALTA) 20 MG capsule     Please advise.

## 2024-02-04 MED ORDER — DULOXETINE HCL 20 MG PO CPEP
40.0000 mg | ORAL_CAPSULE | Freq: Two times a day (BID) | ORAL | 1 refills | Status: DC
Start: 2024-02-04 — End: 2024-07-13

## 2024-02-04 NOTE — Telephone Encounter (Signed)
 LOV 2*15*25 NOV none on file LRF [id number] LABS 3*3*24

## 2024-02-05 ENCOUNTER — Ambulatory Visit: Payer: 59 | Admitting: Dermatology

## 2024-02-11 ENCOUNTER — Encounter: Attending: Specialist

## 2024-02-11 VITALS — Ht 67.55 in | Wt 201.7 lb

## 2024-02-11 DIAGNOSIS — J449 Chronic obstructive pulmonary disease, unspecified: Secondary | ICD-10-CM | POA: Diagnosis present

## 2024-02-11 NOTE — Progress Notes (Signed)
 Pulmonary Individual Treatment Plan  Patient Details  Name: Nancy [name] MRN: [id number] Date of Birth: 1960/12/13 Referring Provider:   Flowsheet Row Pulmonary Rehab from 02/11/2024 in [facility] Cardiac and Pulmonary Rehab  Referring Provider Dr. Ned Clines       Initial Encounter Date:  Flowsheet Row Pulmonary Rehab from 02/11/2024 in [facility] Cardiac and Pulmonary Rehab  Date 02/11/24       Visit Diagnosis: Stage 3 severe COPD by GOLD classification (HCC)  Patient's Home Medications on Admission:  Current Outpatient Medications:    acetaminophen (TYLENOL) 500 MG tablet, Take 500 mg by mouth every 4 (four) hours as needed., Disp: , Rfl:    albuterol (VENTOLIN HFA) 108 (90 Base) MCG/ACT inhaler, SMARTSIG:2 Inhalation Via Inhaler Every 6 Hours PRN, Disp: , Rfl:    amLODipine (NORVASC) 5 MG tablet, Take 5 mg by mouth daily., Disp: , Rfl:    benzonatate (TESSALON) 200 MG capsule, Take 1 capsule (200 mg total) by mouth 2 (two) times daily as needed for cough., Disp: 20 capsule, Rfl: 0   diclofenac Sodium (VOLTAREN) 1 % GEL, Apply 2 g topically 4 (four) times daily., Disp: 100 g, Rfl: 0   doxycycline (PERIOSTAT) 20 MG tablet, Take one tab po BID with food., Disp: 60 tablet, Rfl: 11   DULoxetine (CYMBALTA) 20 MG capsule, Take 2 capsules (40 mg total) by mouth 2 (two) times daily., Disp: 360 capsule, Rfl: 1   enoxaparin (LOVENOX) 100 MG/ML injection, Inject into the skin. (Patient not taking: Reported on 12/24/2023), Disp: , Rfl:    enoxaparin (LOVENOX) 40 MG/0.4ML injection, Inject 90 mg into the skin daily. (Patient not taking: Reported on 12/24/2023), Disp: , Rfl:    Fluticasone-Umeclidin-Vilant (TRELEGY ELLIPTA) 100-62.5-25 MCG/ACT AEPB, Inhale into the lungs., Disp: , Rfl:    furosemide (LASIX) 40 MG tablet, Take 40 mg by mouth 2 (two) times daily. (Patient not taking: Reported on 12/24/2023), Disp: , Rfl:    furosemide (LASIX) 40 MG tablet, Take 1 tablet by mouth 2 (two) times daily., Disp: ,  Rfl:    ipratropium-albuterol (DUONEB) 0.5-2.5 (3) MG/3ML SOLN, Inhale into the lungs., Disp: , Rfl:    levofloxacin (LEVAQUIN) 750 MG tablet, Take 1 tablet (750 mg total) by mouth daily., Disp: 7 tablet, Rfl: 0   levothyroxine (SYNTHROID) 125 MCG tablet, Take 1 tablet (125 mcg total) by mouth daily before breakfast., Disp: 90 tablet, Rfl: 3   lidocaine (LIDODERM) 5 %, Place 1 patch onto the skin every 12 (twelve) hours. Remove & Discard patch within 12 hours or as directed by MD, Disp: 15 patch, Rfl: 0   metoprolol succinate (TOPROL-XL) 25 MG 24 hr tablet, Take 25 mg by mouth daily., Disp: , Rfl:    Na Sulfate-K Sulfate-Mg Sulfate concentrate 17.5-3.13-1.6 GM/177ML SOLN, , Disp: , Rfl:    ondansetron (ZOFRAN ODT) 4 MG disintegrating tablet, Take 1 tablet (4 mg total) by mouth every 8 (eight) hours as needed for nausea or vomiting., Disp: 20 tablet, Rfl: 2   oxyCODONE (OXY IR/ROXICODONE) 5 MG immediate release tablet, Take 1 tablet (5 mg total) by mouth every 6 (six) hours as needed for moderate pain., Disp: 30 tablet, Rfl: 0   polyethylene glycol (MIRALAX / GLYCOLAX) 17 g packet, Take 17 g by mouth daily as needed., Disp: , Rfl:    senna-docusate (SENOKOT-S) 8.6-50 MG tablet, Take 1 tablet by mouth 2 (two) times daily as needed for mild constipation., Disp: 20 tablet, Rfl: 0   TRELEGY ELLIPTA 100-62.5-25 MCG/ACT AEPB,  Inhale into the lungs., Disp: , Rfl:    valACYclovir (VALTREX) 1000 MG tablet, Take 1 tablet (1,000 mg total) by mouth daily., Disp: 6 tablet, Rfl: 0   warfarin (COUMADIN) 5 MG tablet, Take 5 mg by mouth every evening. Per cardiologist: patient supposed to take 7.5 mg 9/24 and 9/25 evening for low INR then resume 5 mg nightly  Goal INR 2.5-3.5, Disp: , Rfl:   Current Facility-Administered Medications:    ipratropium-albuterol (DUONEB) 0.5-2.5 (3) MG/3ML nebulizer solution 3 mL, 3 mL, Nebulization, Once, Pollak, Adriana M, PA-C  Past Medical History: Past Medical History:  Diagnosis  Date   Aortic dissection (HCC) 2010   COPD (chronic obstructive pulmonary disease) (HCC)    Depression    Hypertension    Hypothyroidism    Pneumonia    PONV (postoperative nausea and vomiting)    Rosacea    Seasonal allergies    Sleep apnea    Does not use CPAP    Tobacco Use: Social History   Tobacco Use  Smoking Status Former   Current packs/day: 0.00   Average packs/day: 0.5 packs/day for 30.0 years (15.0 ttl pk-yrs)   Types: Cigarettes   Start date: 03/11/1986   Quit date: 03/11/2016   Years since quitting: 7.9  Smokeless Tobacco Never  Tobacco Comments   patient states  that she is working at her own pace to quit smoking    Labs: Review Flowsheet  More data may exist      Latest Ref Rng & Units 07/02/2009 08/29/2015 01/05/2018 09/29/2020 08/20/2023  Labs for ITP Cardiac and Pulmonary Rehab  Cholestrol 100 - 199 mg/dL - 253  664  403  474   LDL (calc) 0 - 99 mg/dL - 259  563  875  643   HDL-C >39 mg/dL - 53  50  45  38   Trlycerides 0 - 149 mg/dL - 329  518  841  660   Hemoglobin A1c 4.8 - 5.6 % - - - - 4.8   TCO2 0 - 100 mmol/L 22  - - - -     Pulmonary Assessment Scores:  Pulmonary Assessment Scores     Row Name 02/11/24 1537         mMRC Score   mMRC Score 3              UCSD: Self-administered rating of dyspnea associated with activities of daily living (ADLs) 6-point scale (0 = "not at all" to 5 = "maximal or unable to do because of breathlessness")  Scoring Scores range from 0 to 120.  Minimally important difference is 5 units  CAT: CAT can identify the health impairment of COPD patients and is better correlated with disease progression.  CAT has a scoring range of zero to 40. The CAT score is classified into four groups of low (less than 10), medium (10 - 20), high (21-30) and very high (31-40) based on the impact level of disease on health status. A CAT score over 10 suggests significant symptoms.  A worsening CAT score could be explained by an  exacerbation, poor medication adherence, poor inhaler technique, or progression of COPD or comorbid conditions.  CAT MCID is 2 points  mMRC: mMRC (Modified Medical Research Council) Dyspnea Scale is used to assess the degree of baseline functional disability in patients of respiratory disease due to dyspnea. No minimal important difference is established. A decrease in score of 1 point or greater is considered a positive change.   Pulmonary  Function Assessment:  Pulmonary Function Assessment - 12/24/23 1342       Breath   Shortness of Breath Yes;Limiting activity             Exercise Target Goals: Exercise Program Goal: Individual exercise prescription set using results from initial 6 min walk test and THRR while considering  patient's activity barriers and safety.   Exercise Prescription Goal: Initial exercise prescription builds to 30-45 minutes a day of aerobic activity, 2-3 days per week.  Home exercise guidelines will be given to patient during program as part of exercise prescription that the participant will acknowledge.  Education: Aerobic Exercise: - Group verbal and visual presentation on the components of exercise prescription. Introduces F.I.T.T principle from ACSM for exercise prescriptions.  Reviews F.I.T.T. principles of aerobic exercise including progression. Written material given at graduation. Flowsheet Row Pulmonary Rehab from 01/29/2023 in [facility] Cardiac and Pulmonary Rehab  Date 11/27/22  Educator KW  Instruction Review Code 1- [employer] Understanding       Education: Resistance Exercise: - Group verbal and visual presentation on the components of exercise prescription. Introduces F.I.T.T principle from ACSM for exercise prescriptions  Reviews F.I.T.T. principles of resistance exercise including progression. Written material given at graduation. Flowsheet Row Pulmonary Rehab from 01/29/2023 in [facility] Cardiac and Pulmonary Rehab  Date 12/04/22  Educator KW   Instruction Review Code 1- [employer] Understanding        Education: Exercise & Equipment Safety: - Individual verbal instruction and demonstration of equipment use and safety with use of the equipment. Flowsheet Row Pulmonary Rehab from 02/11/2024 in [facility] Cardiac and Pulmonary Rehab  Date 02/11/24  Educator [facility]  Instruction Review Code 1- Verbalizes Understanding       Education: Exercise Physiology & General Exercise Guidelines: - Group verbal and written instruction with models to review the exercise physiology of the cardiovascular system and associated critical values. Provides general exercise guidelines with specific guidelines to those with heart or lung disease.    Education: Flexibility, Balance, Mind/Body Relaxation: - Group verbal and visual presentation with interactive activity on the components of exercise prescription. Introduces F.I.T.T principle from ACSM for exercise prescriptions. Reviews F.I.T.T. principles of flexibility and balance exercise training including progression. Also discusses the mind body connection.  Reviews various relaxation techniques to help reduce and manage stress (i.e. Deep breathing, progressive muscle relaxation, and visualization). Balance handout provided to take home. Written material given at graduation. Flowsheet Row Pulmonary Rehab from 01/29/2023 in [facility] Cardiac and Pulmonary Rehab  Date 12/11/22  Educator KW  Instruction Review Code 1- Verbalizes Understanding       Activity Barriers & Risk Stratification:  Activity Barriers & Cardiac Risk Stratification - 02/11/24 1530       Activity Barriers & Cardiac Risk Stratification   Activity Barriers None             6 Minute Walk:  6 Minute Walk     Row Name 02/11/24 1528         6 Minute Walk   Phase Initial     Distance 755 feet     Walk Time 5.17 minutes     # of Rest Breaks 2     MPH 1.66     METS 2.7     RPE 12     Perceived Dyspnea  4     VO2 Peak 9.56      Symptoms Yes (comment)     Comments SOB     Resting HR 98 bpm  Resting BP 122/58     Resting Oxygen Saturation  97 %     Exercise Oxygen Saturation  during 6 min walk 95 %     Max Ex. HR 112 bpm     Max Ex. BP 162/64       Interval HR   1 Minute HR 102     2 Minute HR 103     3 Minute HR 107     4 Minute HR 110     5 Minute HR 106     6 Minute HR 112     2 Minute Post HR 106     Interval Heart Rate? Yes       Interval Oxygen   Interval Oxygen? Yes     Baseline Oxygen Saturation % 97 %     1 Minute Oxygen Saturation % 98 %     1 Minute Liters of Oxygen 2 L     2 Minute Oxygen Saturation % 97 %     2 Minute Liters of Oxygen 2 L     3 Minute Oxygen Saturation % 96 %     3 Minute Liters of Oxygen 2 L     4 Minute Oxygen Saturation % 96 %     4 Minute Liters of Oxygen 2 L     5 Minute Oxygen Saturation % 97 %     5 Minute Liters of Oxygen 2 L     6 Minute Oxygen Saturation % 95 %     6 Minute Liters of Oxygen 2 L     2 Minute Post Oxygen Saturation % 98 %     2 Minute Post Liters of Oxygen 2 L             Oxygen Initial Assessment:  Oxygen Initial Assessment - 02/11/24 1537       Home Oxygen   Home Oxygen Device Portable Concentrator;Home Concentrator    Sleep Oxygen Prescription Continuous    Liters per minute 2    Home Exercise Oxygen Prescription Continuous    Liters per minute 2    Home Resting Oxygen Prescription None    Compliance with Home Oxygen Use Yes      Initial 6 min Walk   Oxygen Used Continuous    Liters per minute 2      Program Oxygen Prescription   Program Oxygen Prescription Continuous    Liters per minute 2      Intervention   Short Term Goals To learn and exhibit compliance with exercise, home and travel O2 prescription;To learn and understand importance of monitoring SPO2 with pulse oximeter and demonstrate accurate use of the pulse oximeter.;To learn and demonstrate proper pursed lip breathing techniques or other breathing  techniques. ;To learn and demonstrate proper use of respiratory medications;To learn and understand importance of maintaining oxygen saturations>88%    Long  Term Goals Exhibits compliance with exercise, home  and travel O2 prescription;Maintenance of O2 saturations>88%;Compliance with respiratory medication;Demonstrates proper use of MDI's;Exhibits proper breathing techniques, such as pursed lip breathing or other method taught during program session;Verbalizes importance of monitoring SPO2 with pulse oximeter and return demonstration             Oxygen Re-Evaluation:   Oxygen Discharge (Final Oxygen Re-Evaluation):   Initial Exercise Prescription:  Initial Exercise Prescription - 02/11/24 1500       Date of Initial Exercise RX and Referring Provider   Date 02/11/24    Referring Provider Dr.  Herbon Fleming      Oxygen   Oxygen Continuous    Liters 2    Maintain Oxygen Saturation 88% or higher      Treadmill   MPH 1.5    Grade 0    Minutes 15    METs 2.15      NuStep   Level 2    SPM 80    Minutes 15    METs 2.7      REL-XR   Level 2    Watts 25    Speed 50    Minutes 15    METs 2.7      Track   Laps 20    Minutes 15    METs 2.09      Intensity   THRR 40-80% of Max Heartrate 122-147    Ratings of Perceived Exertion 11-13    Perceived Dyspnea 0-4      Resistance Training   Training Prescription Yes    Weight 3    Reps 10-15             Perform Capillary Blood Glucose checks as needed.  Exercise Prescription Changes:   Exercise Prescription Changes     Row Name 02/11/24 1500             Response to Exercise   Blood Pressure (Admit) 122/58       Blood Pressure (Exercise) 162/64       Blood Pressure (Exit) 142/60       Heart Rate (Admit) 98 bpm       Heart Rate (Exercise) 112 bpm       Heart Rate (Exit) 106 bpm       Oxygen Saturation (Admit) 97 %       Oxygen Saturation (Exercise) 95 %       Oxygen Saturation (Exit) 98 %        Rating of Perceived Exertion (Exercise) 12       Perceived Dyspnea (Exercise) 4       Symptoms SOB       Comments results                Exercise Comments:   Exercise Goals and Review:   Exercise Goals     Row Name 02/11/24 1535             Exercise Goals   Increase Physical Activity Yes       Intervention Provide advice, education, support and counseling about physical activity/exercise needs.;Develop an individualized exercise prescription for aerobic and resistive training based on initial evaluation findings, risk stratification, comorbidities and participant's personal goals.       Expected Outcomes Short Term: Attend rehab on a regular basis to increase amount of physical activity.;Long Term: Add in home exercise to make exercise part of routine and to increase amount of physical activity.;Long Term: Exercising regularly at least 3-5 days a week.       Increase Strength and Stamina Yes       Intervention Provide advice, education, support and counseling about physical activity/exercise needs.;Develop an individualized exercise prescription for aerobic and resistive training based on initial evaluation findings, risk stratification, comorbidities and participant's personal goals.       Expected Outcomes Short Term: Increase workloads from initial exercise prescription for resistance, speed, and METs.;Short Term: Perform resistance training exercises routinely during rehab and add in resistance training at home;Long Term: Improve cardiorespiratory fitness, muscular endurance and strength as measured by increased METs and  functional capacity ( )       Able to understand and use rate of perceived exertion (RPE) scale Yes       Intervention Provide education and explanation on how to use RPE scale       Expected Outcomes Short Term: Able to use RPE daily in rehab to express subjective intensity level;Long Term:  Able to use RPE to guide intensity level when exercising  independently       Able to understand and use Dyspnea scale Yes       Intervention Provide education and explanation on how to use Dyspnea scale       Expected Outcomes Short Term: Able to use Dyspnea scale daily in rehab to express subjective sense of shortness of breath during exertion;Long Term: Able to use Dyspnea scale to guide intensity level when exercising independently       Knowledge and understanding of Target Heart Rate Range (THRR) Yes       Intervention Provide education and explanation of THRR including how the numbers were predicted and where they are located for reference       Expected Outcomes Short Term: Able to state/look up THRR;Long Term: Able to use THRR to govern intensity when exercising independently;Short Term: Able to use daily as guideline for intensity in rehab       Able to check pulse independently Yes       Intervention Provide education and demonstration on how to check pulse in carotid and radial arteries.;Review the importance of being able to check your own pulse for safety during independent exercise       Expected Outcomes Short Term: Able to explain why pulse checking is important during independent exercise       Understanding of Exercise Prescription Yes       Intervention Provide education, explanation, and written materials on patient's individual exercise prescription       Expected Outcomes Short Term: Able to explain program exercise prescription;Long Term: Able to explain home exercise prescription to exercise independently                Exercise Goals Re-Evaluation :   Discharge Exercise Prescription (Final Exercise Prescription Changes):  Exercise Prescription Changes - 02/11/24 1500       Response to Exercise   Blood Pressure (Admit) 122/58    Blood Pressure (Exercise) 162/64    Blood Pressure (Exit) 142/60    Heart Rate (Admit) 98 bpm    Heart Rate (Exercise) 112 bpm    Heart Rate (Exit) 106 bpm    Oxygen Saturation (Admit) 97 %     Oxygen Saturation (Exercise) 95 %    Oxygen Saturation (Exit) 98 %    Rating of Perceived Exertion (Exercise) 12    Perceived Dyspnea (Exercise) 4    Symptoms SOB    Comments results             Nutrition:  Target Goals: Understanding of nutrition guidelines, daily intake of sodium 1500mg , cholesterol 200mg , calories 30% from fat and 7% or less from saturated fats, daily to have 5 or more servings of fruits and vegetables.  Education: All About Nutrition: -Group instruction provided by verbal, written material, interactive activities, discussions, models, and posters to present general guidelines for heart healthy nutrition including fat, fiber, MyPlate, the role of sodium in heart healthy nutrition, utilization of the nutrition label, and utilization of this knowledge for meal planning. Follow up email sent as well. Written material given at graduation.  Flowsheet Row Pulmonary Rehab from 01/29/2023 in [facility] Cardiac and Pulmonary Rehab  Education need identified 11/18/22       Biometrics:  Pre Biometrics - 02/11/24 1535       Pre Biometrics   Height 5' 7.55" (1.716 m)    Weight 201 lb 11.2 oz (91.5 kg)    Waist Circumference 39.5 inches    Hip Circumference 43 inches    Waist to Hip Ratio 0.92 %    BMI (Calculated) 31.07    Single Leg Stand 7.34 seconds              Nutrition Therapy Plan and Nutrition Goals:   Nutrition Assessments:  MEDIFICTS Score Key: >=70 Need to make dietary changes  40-70 Heart Healthy Diet <= 40 Therapeutic Level Cholesterol Diet  Flowsheet Row Pulmonary Rehab from 02/13/2023 in [facility] Cardiac and Pulmonary Rehab  Picture Your Plate Total Score on Discharge 56      Picture Your Plate Scores: <16 Unhealthy dietary pattern with much room for improvement. 41-50 Dietary pattern unlikely to meet recommendations for good health and room for improvement. 51-60 More healthful dietary pattern, with some room for improvement.  >60  Healthy dietary pattern, although there may be some specific behaviors that could be improved.   Nutrition Goals Re-Evaluation:   Nutrition Goals Discharge (Final Nutrition Goals Re-Evaluation):   Psychosocial: Target Goals: Acknowledge presence or absence of significant depression and/or stress, maximize coping skills, provide positive support system. Participant is able to verbalize types and ability to use techniques and skills needed for reducing stress and depression.   Education: Stress, Anxiety, and Depression - Group verbal and visual presentation to define topics covered.  Reviews how body is impacted by stress, anxiety, and depression.  Also discusses healthy ways to reduce stress and to treat/manage anxiety and depression.  Written material given at graduation. Flowsheet Row Pulmonary Rehab from 01/29/2023 in [facility] Cardiac and Pulmonary Rehab  Date 01/22/23  Educator KW  Instruction Review Code 1- [employer] Understanding       Education: Sleep Hygiene -Provides group verbal and written instruction about how sleep can affect your health.  Define sleep hygiene, discuss sleep cycles and impact of sleep habits. Review good sleep hygiene tips.    Initial Review & Psychosocial Screening:  Initial Psych Review & Screening - 12/24/23 1343       Initial Review   Current issues with None Identified      Family Dynamics   Good Support System? Yes    Comments She can look look to her sister for support. She lives alone and feels like she has no mental instability.      Barriers   Psychosocial barriers to participate in program The patient should benefit from training in stress management and relaxation.;There are no identifiable barriers or psychosocial needs.      Screening Interventions   Interventions Encouraged to exercise;Provide feedback about the scores to participant;Program counselor consult;To provide support and resources with identified psychosocial needs     Expected Outcomes Short Term goal: Utilizing psychosocial counselor, staff and physician to assist with identification of specific Stressors or current issues interfering with healing process. Setting desired goal for each stressor or current issue identified.;Long Term Goal: Stressors or current issues are controlled or eliminated.;Short Term goal: Identification and review with participant of any Quality of Life or Depression concerns found by scoring the questionnaire.;Long Term goal: The participant improves quality of Life and PHQ9 Scores as seen by post scores and/or verbalization  of changes             Quality of Life Scores:  Scores of 19 and below usually indicate a poorer quality of life in these areas.  A difference of  2-3 points is a clinically meaningful difference.  A difference of 2-3 points in the total score of the Quality of Life Index has been associated with significant improvement in overall quality of life, self-image, physical symptoms, and general health in studies assessing change in quality of life.  PHQ-9: Review Flowsheet  More data exists      02/11/2024 08/20/2023 02/13/2023 01/28/2023 11/18/2022  Depression screen PHQ 2/9  Decreased Interest 0 0 1 0 1  Down, Depressed, Hopeless 0 0 0 0 0  PHQ - 2 Score 0 0 1 0 1  Altered sleeping 0 1 0 0 0  Tired, decreased energy 0 1 3 3 3   Change in appetite 0 0 0 0 0  Feeling bad or failure about yourself  0 0 0 0 0  Trouble concentrating 0 0 0 0 0  Moving slowly or fidgety/restless 0 0 0 0 0  Suicidal thoughts 0 0 0 0 0  PHQ-9 Score 0 2 4 3 4   Difficult doing work/chores - - Somewhat difficult Not difficult at all -   Interpretation of Total Score  Total Score Depression Severity:  1-4 = Minimal depression, 5-9 = Mild depression, 10-14 = Moderate depression, 15-19 = Moderately severe depression, 20-27 = Severe depression   Psychosocial Evaluation and Intervention:  Psychosocial Evaluation - 12/24/23 1344        Psychosocial Evaluation & Interventions   Interventions Encouraged to exercise with the program and follow exercise prescription    Comments She can look look to her sister for support. She lives alone and feels like she has no mental instability.    Expected Outcomes Short: attend pulmonary rehab for education and exercise. Long: develop and maintain positive self care habits.    Continue Psychosocial Services  Follow up required by staff             Psychosocial Re-Evaluation:   Psychosocial Discharge (Final Psychosocial Re-Evaluation):   Education: Education Goals: Education classes will be provided on a weekly basis, covering required topics. Participant will state understanding/return demonstration of topics presented.  Learning Barriers/Preferences:   General Pulmonary Education Topics:  Infection Prevention: - Provides verbal and written material to individual with discussion of infection control including proper hand washing and proper equipment cleaning during exercise session. Flowsheet Row Pulmonary Rehab from 02/11/2024 in [facility] Card[facility] and Pulmonary Rehab  Date 02/11/24  Educator [facility]  Instruction Review Code 1- Verbalizes Understanding       Falls Prevention: - Provides verbal and written material to individual with discussion of falls prevention and safety. Flowsheet Row Pulmonary Rehab from 02/11/2024 in [facility] Cardiac and Pulmonary Rehab  Date 02/11/24  Educator [facility]  Instruction Review Code 1- Verbalizes Understanding       Chronic Lung Disease Review: - Group verbal instruction with posters, models, PowerPoint presentations and videos,  to review new updates, new respiratory medications, new advancements in procedures and treatments. Providing information on websites and "800" numbers for continued self-education. Includes information about supplement oxygen, available portable oxygen systems, continuous and intermittent flow rates, oxygen safety, concentrators,  and Medicare reimbursement for oxygen. Explanation of Pulmonary Drugs, including class, frequency, complications, importance of spacers, rinsing mouth after steroid MDI's, and proper cleaning methods for nebulizers. Review of basic lung anatomy and  physiology related to function, structure, and complications of lung disease. Review of risk factors. Discussion about methods for diagnosing sleep apnea and types of masks and machines for OSA. Includes a review of the use of types of environmental controls: home humidity, furnaces, filters, dust mite/pet prevention, HEPA vacuums. Discussion about weather changes, air quality and the benefits of nasal washing. Instruction on Warning signs, infection symptoms, calling MD promptly, preventive modes, and value of vaccinations. Review of effective airway clearance, coughing and/or vibration techniques. Emphasizing that all should Create an Action Plan. Written material given at graduation. Flowsheet Row Pulmonary Rehab from 01/29/2023 in [facility] Cardiac and Pulmonary Rehab  Education need identified 11/18/22  Date 01/15/23  Educator [facility]  Instruction Review Code 1- Verbalizes Understanding       AED/CPR: - Group verbal and written instruction with the use of models to demonstrate the basic use of the AED with the basic ABC's of resuscitation.    Anatomy and Cardiac Procedures: - Group verbal and visual presentation and models provide information about basic cardiac anatomy and function. Reviews the testing methods done to diagnose heart disease and the outcomes of the test results. Describes the treatment choices: Medical Management, Angioplasty, or Coronary Bypass Surgery for treating various heart conditions including Myocardial Infarction, Angina, Valve Disease, and Cardiac Arrhythmias.  Written material given at graduation. Flowsheet Row Pulmonary Rehab from 01/29/2023 in [facility] Cardiac and Pulmonary Rehab  Date 12/18/22  Educator [facility]  Instruction Review Code  1- Verbalizes Understanding       Medication Safety: - Group verbal and visual instruction to review commonly prescribed medications for heart and lung disease. Reviews the medication, class of the drug, and side effects. Includes the steps to properly store meds and maintain the prescription regimen.  Written material given at graduation.   Other: -Provides group and verbal instruction on various topics (see comments) Flowsheet Row Pulmonary Rehab from 01/29/2023 in [facility] Cardiac and Pulmonary Rehab  Date 12/25/22  Educator [facility]  Instruction Review Code 1- Verbalizes Understanding  [Jeopardy]       Knowledge Questionnaire Score:    Core Components/Risk Factors/Patient Goals at Admission:  Personal Goals and Risk Factors at Admission - 12/24/23 1342       Core Components/Risk Factors/Patient Goals on Admission    Weight Management Yes;Weight Loss    Intervention Weight Management: Develop a combined nutrition and exercise program designed to reach desired caloric intake, while maintaining appropriate intake of nutrient and fiber, sodium and fats, and appropriate energy expenditure required for the weight goal.;Weight Management: Provide education and appropriate resources to help participant work on and attain dietary goals.;Weight Management/Obesity: Establish reasonable short term and long term weight goals.;Obesity: Provide education and appropriate resources to help participant work on and attain dietary goals.    Expected Outcomes Short Term: Continue to assess and modify interventions until short term weight is achieved;Long Term: Adherence to nutrition and physical activity/exercise program aimed toward attainment of established weight goal;Weight Loss: Understanding of general recommendations for a balanced deficit meal plan, which promotes 1-2 lb weight loss per week and includes a negative energy balance of [phone] kcal/d;Understanding recommendations for meals to include 15-35%  energy as protein, 25-35% energy from fat, 35-60% energy from carbohydrates, less than 200mg  of dietary cholesterol, 20-35 gm of total fiber daily;Understanding of distribution of calorie intake throughout the day with the consumption of 4-5 meals/snacks    Improve shortness of breath with ADL's Yes    Intervention Provide education, individualized exercise plan and  daily activity instruction to help decrease symptoms of SOB with activities of daily living.    Expected Outcomes Short Term: Improve cardiorespiratory fitness to achieve a reduction of symptoms when performing ADLs;Long Term: Be able to perform more ADLs without symptoms or delay the onset of symptoms    Hypertension Yes    Intervention Provide education on lifestyle modifcations including regular physical activity/exercise, weight management, moderate sodium restriction and increased consumption of fresh fruit, vegetables, and low fat dairy, alcohol moderation, and smoking cessation.;Monitor prescription use compliance.    Expected Outcomes Short Term: Continued assessment and intervention until BP is < 140/36mm HG in hypertensive participants. < 130/97mm HG in hypertensive participants with diabetes, heart failure or chronic kidney disease.;Long Term: Maintenance of blood pressure at goal levels.             Education:Diabetes - Individual verbal and written instruction to review signs/symptoms of diabetes, desired ranges of glucose level fasting, after meals and with exercise. Acknowledge that pre and post exercise glucose checks will be done for 3 sessions at entry of program.   Know Your Numbers and Heart Failure: - Group verbal and visual instruction to discuss disease risk factors for cardiac and pulmonary disease and treatment options.  Reviews associated critical values for Overweight/Obesity, Hypertension, Cholesterol, and Diabetes.  Discusses basics of heart failure: signs/symptoms and treatments.  Introduces Heart Failure  Zone chart for action plan for heart failure.  Written material given at graduation.   Core Components/Risk Factors/Patient Goals Review:    Core Components/Risk Factors/Patient Goals at Discharge (Final Review):    ITP Comments:  ITP Comments     Row Name 12/24/23 1350 02/11/24 1527         ITP Comments Virtual Visit completed. Patient informed on EP and RD appointment and 6 Minute walk test. Patient also informed of patient health questionnaires on My Chart. Patient Verbalizes understanding. Visit diagnosis can be found in [facility] 12/08/2023. Completed and gym orientation. Initial ITP created and sent for review to Dr. Jinny Sanders, Medical Director.               Comments: Initial ITP

## 2024-02-11 NOTE — Patient Instructions (Signed)
 Patient Instructions  Patient Details  Name: Nancy [name] MRN: [id number] Date of Birth: [birth year] Referring Provider:  Mertie Moores, MD  Below are your personal goals for exercise, nutrition, and risk factors. Our goal is to help you stay on track towards obtaining and maintaining these goals. We will be discussing your progress on these goals with you throughout the program.  Initial Exercise Prescription:  Initial Exercise Prescription - 02/11/24 1500       Date of Initial Exercise RX and Referring Provider   Date 02/11/24    Referring Provider Dr. Ned Clines      Oxygen   Oxygen Continuous    Liters 2    Maintain Oxygen Saturation 88% or higher      Treadmill   MPH 1.5    Grade 0    Minutes 15    METs 2.15      NuStep   Level 2    SPM 80    Minutes 15    METs 2.7      REL-XR   Level 2    Watts 25    Speed 50    Minutes 15    METs 2.7      Track   Laps 20    Minutes 15    METs 2.09      Intensity   THRR 40-80% of Max Heartrate 122-147    Ratings of Perceived Exertion 11-13    Perceived Dyspnea 0-4      Resistance Training   Training Prescription Yes    Weight 3    Reps 10-15             Exercise Goals: Frequency: Be able to perform aerobic exercise two to three times per week in program working toward 2-5 days per week of home exercise.  Intensity: Work with a perceived exertion of 11 (fairly light) - 15 (hard) while following your exercise prescription.  We will make changes to your prescription with you as you progress through the program.   Duration: Be able to do 30 to 45 minutes of continuous aerobic exercise in addition to a 5 minute warm-up and a 5 minute cool-down routine.   Nutrition Goals: Your personal nutrition goals will be established when you do your nutrition analysis with the dietician.  The following are general nutrition guidelines to follow: Cholesterol < 200mg /day Sodium < 1500mg /day Fiber: Women over 50 yrs -  21 grams per day  Personal Goals:  Personal Goals and Risk Factors at Admission - 12/24/23 1342       Core Components/Risk Factors/Patient Goals on Admission    Weight Management Yes;Weight Loss    Intervention Weight Management: Develop a combined nutrition and exercise program designed to reach desired caloric intake, while maintaining appropriate intake of nutrient and fiber, sodium and fats, and appropriate energy expenditure required for the weight goal.;Weight Management: Provide education and appropriate resources to help participant work on and attain dietary goals.;Weight Management/Obesity: Establish reasonable short term and long term weight goals.;Obesity: Provide education and appropriate resources to help participant work on and attain dietary goals.    Expected Outcomes Short Term: Continue to assess and modify interventions until short term weight is achieved;Long Term: Adherence to nutrition and physical activity/exercise program aimed toward attainment of established weight goal;Weight Loss: Understanding of general recommendations for a balanced deficit meal plan, which promotes 1-2 lb weight loss per week and includes a negative energy balance of [phone] kcal/d;Understanding recommendations for meals to include  15-35% energy as protein, 25-35% energy from fat, 35-60% energy from carbohydrates, less than 200mg  of dietary cholesterol, 20-35 gm of total fiber daily;Understanding of distribution of calorie intake throughout the day with the consumption of 4-5 meals/snacks    Improve shortness of breath with ADL's Yes    Intervention Provide education, individualized exercise plan and daily activity instruction to help decrease symptoms of SOB with activities of daily living.    Expected Outcomes Short Term: Improve cardiorespiratory fitness to achieve a reduction of symptoms when performing ADLs;Long Term: Be able to perform more ADLs without symptoms or delay the onset of symptoms     Hypertension Yes    Intervention Provide education on lifestyle modifcations including regular physical activity/exercise, weight management, moderate sodium restriction and increased consumption of fresh fruit, vegetables, and low fat dairy, alcohol moderation, and smoking cessation.;Monitor prescription use compliance.    Expected Outcomes Short Term: Continued assessment and intervention until BP is < 140/37mm HG in hypertensive participants. < 130/63mm HG in hypertensive participants with diabetes, heart failure or chronic kidney disease.;Long Term: Maintenance of blood pressure at goal levels.             Exercise Goals and Review:  Exercise Goals     Row Name 02/11/24 1535             Exercise Goals   Increase Physical Activity Yes       Intervention Provide advice, education, support and counseling about physical activity/exercise needs.;Develop an individualized exercise prescription for aerobic and resistive training based on initial evaluation findings, risk stratification, comorbidities and participant's personal goals.       Expected Outcomes Short Term: Attend rehab on a regular basis to increase amount of physical activity.;Long Term: Add in home exercise to make exercise part of routine and to increase amount of physical activity.;Long Term: Exercising regularly at least 3-5 days a week.       Increase Strength and Stamina Yes       Intervention Provide advice, education, support and counseling about physical activity/exercise needs.;Develop an individualized exercise prescription for aerobic and resistive training based on initial evaluation findings, risk stratification, comorbidities and participant's personal goals.       Expected Outcomes Short Term: Increase workloads from initial exercise prescription for resistance, speed, and METs.;Short Term: Perform resistance training exercises routinely during rehab and add in resistance training at home;Long Term: Improve  cardiorespiratory fitness, muscular endurance and strength as measured by increased METs and functional capacity ( )       Able to understand and use rate of perceived exertion (RPE) scale Yes       Intervention Provide education and explanation on how to use RPE scale       Expected Outcomes Short Term: Able to use RPE daily in rehab to express subjective intensity level;Long Term:  Able to use RPE to guide intensity level when exercising independently       Able to understand and use Dyspnea scale Yes       Intervention Provide education and explanation on how to use Dyspnea scale       Expected Outcomes Short Term: Able to use Dyspnea scale daily in rehab to express subjective sense of shortness of breath during exertion;Long Term: Able to use Dyspnea scale to guide intensity level when exercising independently       Knowledge and understanding of Target Heart Rate Range (THRR) Yes       Intervention Provide education and explanation of THRR including  how the numbers were predicted and where they are located for reference       Expected Outcomes Short Term: Able to state/look up THRR;Long Term: Able to use THRR to govern intensity when exercising independently;Short Term: Able to use daily as guideline for intensity in rehab       Able to check pulse independently Yes       Intervention Provide education and demonstration on how to check pulse in carotid and radial arteries.;Review the importance of being able to check your own pulse for safety during independent exercise       Expected Outcomes Short Term: Able to explain why pulse checking is important during independent exercise       Understanding of Exercise Prescription Yes       Intervention Provide education, explanation, and written materials on patient's individual exercise prescription       Expected Outcomes Short Term: Able to explain program exercise prescription;Long Term: Able to explain home exercise prescription to exercise  independently

## 2024-02-18 ENCOUNTER — Encounter: Admitting: *Deleted

## 2024-02-18 ENCOUNTER — Encounter: Payer: Self-pay | Admitting: *Deleted

## 2024-02-18 DIAGNOSIS — J449 Chronic obstructive pulmonary disease, unspecified: Secondary | ICD-10-CM

## 2024-02-18 NOTE — Progress Notes (Signed)
 30 Day review completed. Medical Director ITP review done, changes made as directed, and signed approval by Medical Director. ? ?

## 2024-02-18 NOTE — Progress Notes (Signed)
 Daily Session Note  Patient Details  Name: Nancy [name] MRN: [id number] Date of Birth: Oct 19, 1961 Referring Provider:   Flowsheet Row Pulmonary Rehab from 02/11/2024 in [facility] Cardiac and Pulmonary Rehab  Referring Provider Dr. Ned Clines       Encounter Date: 02/18/2024  Check In:  Session Check In - 02/18/24 1552       Check-In   Supervising physician immediately available to respond to emergencies See telemetry face sheet for immediately available ER MD    Location [facility]-Cardiac & Pulmonary Rehab    Staff Present Susann Givens RN,BSN;Joseph Hollace Kinnier;Cora Collum, RN, BSN, CCRP    Virtual Visit No    Medication changes reported     No    Fall or balance concerns reported    No    Warm-up and Cool-down Performed on first and last piece of equipment    Resistance Training Performed Yes    VAD Patient? No    PAD/SET Patient? No      Pain Assessment   Currently in Pain? No/denies                Social History   Tobacco Use  Smoking Status Former   Current packs/day: 0.00   Average packs/day: 0.5 packs/day for 30.0 years (15.0 ttl pk-yrs)   Types: Cigarettes   Start date: 03/11/1986   Quit date: 03/11/2016   Years since quitting: 7.9  Smokeless Tobacco Never  Tobacco Comments   patient states  that she is working at her own pace to quit smoking    Goals Met:  Independence with exercise equipment Exercise tolerated well No report of concerns or symptoms today Strength training completed today  Goals Unmet:  Not Applicable  Comments: First full day of exercise!  Patient was oriented to gym and equipment including functions, settings, policies, and procedures.  Patient's individual exercise prescription and treatment plan were reviewed.  All starting workloads were established based on the results of the 6 minute walk test done at initial orientation visit.  The plan for exercise progression was also introduced and progression will be customized based on  patient's performance and goals.    Dr. Bethann Punches is Medical Director for [facility] Cardiac Rehabilitation.  Dr. Vida Rigger is Medical Director for [facility] Pulmonary Rehabilitation.

## 2024-02-18 NOTE — Progress Notes (Signed)
 Pulmonary Individual Treatment Plan  Patient Details  Name: Nancy [name] MRN: [id number] Date of Birth: [birth year] Referring Provider:   Flowsheet Row Pulmonary Rehab from 02/11/2024 in [facility] and Pulmonary Rehab  Referring Provider Dr. Ned Clines       Initial Encounter Date:  Flowsheet Row Pulmonary Rehab from 02/11/2024 in [facility] Cardiac and Pulmonary Rehab  Date 02/11/24       Visit Diagnosis: Stage 3 severe COPD by GOLD classification (HCC)  Patient's Home Medications on Admission:  Current Outpatient Medications:    acetaminophen (TYLENOL) 500 MG tablet, Take 500 mg by mouth every 4 (four) hours as needed., Disp: , Rfl:    albuterol (VENTOLIN HFA) 108 (90 Base) MCG/ACT inhaler, SMARTSIG:2 Inhalation Via Inhaler Every 6 Hours PRN, Disp: , Rfl:    amLODipine (NORVASC) 5 MG tablet, Take 5 mg by mouth daily., Disp: , Rfl:    benzonatate (TESSALON) 200 MG capsule, Take 1 capsule (200 mg total) by mouth 2 (two) times daily as needed for cough., Disp: 20 capsule, Rfl: 0   diclofenac Sodium (VOLTAREN) 1 % GEL, Apply 2 g topically 4 (four) times daily., Disp: 100 g, Rfl: 0   doxycycline (PERIOSTAT) 20 MG tablet, Take one tab po BID with food., Disp: 60 tablet, Rfl: 11   DULoxetine (CYMBALTA) 20 MG capsule, Take 2 capsules (40 mg total) by mouth 2 (two) times daily., Disp: 360 capsule, Rfl: 1   enoxaparin (LOVENOX) 100 MG/ML injection, Inject into the skin. (Patient not taking: Reported on 12/24/2023), Disp: , Rfl:    enoxaparin (LOVENOX) 40 MG/0.4ML injection, Inject 90 mg into the skin daily. (Patient not taking: Reported on 12/24/2023), Disp: , Rfl:    Fluticasone-Umeclidin-Vilant (TRELEGY ELLIPTA) 100-62.5-25 MCG/ACT AEPB, Inhale into the lungs., Disp: , Rfl:    furosemide (LASIX) 40 MG tablet, Take 40 mg by mouth 2 (two) times daily. (Patient not taking: Reported on 12/24/2023), Disp: , Rfl:    furosemide (LASIX) 40 MG tablet, Take 1 tablet by mouth 2 (two) times daily., Disp: ,  Rfl:    ipratropium-albuterol (DUONEB) 0.5-2.5 (3) MG/3ML SOLN, Inhale into the lungs., Disp: , Rfl:    levofloxacin (LEVAQUIN) 750 MG tablet, Take 1 tablet (750 mg total) by mouth daily., Disp: 7 tablet, Rfl: 0   levothyroxine (SYNTHROID) 125 MCG tablet, Take 1 tablet (125 mcg total) by mouth daily before breakfast., Disp: 90 tablet, Rfl: 3   lidocaine (LIDODERM) 5 %, Place 1 patch onto the skin every 12 (twelve) hours. Remove & Discard patch within 12 hours or as directed by MD, Disp: 15 patch, Rfl: 0   metoprolol succinate (TOPROL-XL) 25 MG 24 hr tablet, Take 25 mg by mouth daily., Disp: , Rfl:    Na Sulfate-K Sulfate-Mg Sulfate concentrate 17.5-3.13-1.6 GM/177ML SOLN, , Disp: , Rfl:    ondansetron (ZOFRAN ODT) 4 MG disintegrating tablet, Take 1 tablet (4 mg total) by mouth every 8 (eight) hours as needed for nausea or vomiting., Disp: 20 tablet, Rfl: 2   oxyCODONE (OXY IR/ROXICODONE) 5 MG immediate release tablet, Take 1 tablet (5 mg total) by mouth every 6 (six) hours as needed for moderate pain., Disp: 30 tablet, Rfl: 0   polyethylene glycol (MIRALAX / GLYCOLAX) 17 g packet, Take 17 g by mouth daily as needed., Disp: , Rfl:    senna-docusate (SENOKOT-S) 8.6-50 MG tablet, Take 1 tablet by mouth 2 (two) times daily as needed for mild constipation., Disp: 20 tablet, Rfl: 0   TRELEGY ELLIPTA 100-62.5-25 MCG/ACT AEPB,  Inhale into the lungs., Disp: , Rfl:    valACYclovir (VALTREX) 1000 MG tablet, Take 1 tablet (1,000 mg total) by mouth daily., Disp: 6 tablet, Rfl: 0   warfarin (COUMADIN) 5 MG tablet, Take 5 mg by mouth every evening. Per cardiologist: patient supposed to take 7.5 mg 9/24 and 9/25 evening for low INR then resume 5 mg nightly  Goal INR 2.5-3.5, Disp: , Rfl:   Current Facility-Administered Medications:    ipratropium-albuterol (DUONEB) 0.5-2.5 (3) MG/3ML nebulizer solution 3 mL, 3 mL, Nebulization, Once, Pollak, Adriana M, PA-C  Past Medical History: Past Medical History:  Diagnosis  Date   Aortic dissection (HCC) 2010   COPD (chronic obstructive pulmonary disease) (HCC)    Depression    Hypertension    Hypothyroidism    Pneumonia    PONV (postoperative nausea and vomiting)    Rosacea    Seasonal allergies    Sleep apnea    Does not use CPAP    Tobacco Use: Social History   Tobacco Use  Smoking Status Former   Current packs/day: 0.00   Average packs/day: 0.5 packs/day for 30.0 years (15.0 ttl pk-yrs)   Types: Cigarettes   Start date: 03/11/1986   Quit date: 03/11/2016   Years since quitting: 7.9  Smokeless Tobacco Never  Tobacco Comments   patient states  that she is working at her own pace to quit smoking    Labs: Review Flowsheet  More data may exist      Latest Ref Rng & Units 07/02/2009 08/29/2015 01/05/2018 09/29/2020 08/20/2023  Labs for ITP Cardiac and Pulmonary Rehab  Cholestrol 100 - 199 mg/dL - 161  096  045  409   LDL (calc) 0 - 99 mg/dL - 811  914  782  956   HDL-C >39 mg/dL - 53  50  45  38   Trlycerides 0 - 149 mg/dL - 213  086  578  469   Hemoglobin A1c 4.8 - 5.6 % - - - - 4.8   TCO2 0 - 100 mmol/L 22  - - - -     Pulmonary Assessment Scores:  Pulmonary Assessment Scores     Row Name 02/11/24 1537         mMRC Score   mMRC Score 3              UCSD: Self-administered rating of dyspnea associated with activities of daily living (ADLs) 6-point scale (0 = "not at all" to 5 = "maximal or unable to do because of breathlessness")  Scoring Scores range from 0 to 120.  Minimally important difference is 5 units  CAT: CAT can identify the health impairment of COPD patients and is better correlated with disease progression.  CAT has a scoring range of zero to 40. The CAT score is classified into four groups of low (less than 10), medium (10 - 20), high (21-30) and very high (31-40) based on the impact level of disease on health status. A CAT score over 10 suggests significant symptoms.  A worsening CAT score could be explained by an  exacerbation, poor medication adherence, poor inhaler technique, or progression of COPD or comorbid conditions.  CAT MCID is 2 points  mMRC: mMRC (Modified Medical Research Council) Dyspnea Scale is used to assess the degree of baseline functional disability in patients of respiratory disease due to dyspnea. No minimal important difference is established. A decrease in score of 1 point or greater is considered a positive change.   Pulmonary  Function Assessment:  Pulmonary Function Assessment - 12/24/23 1342       Breath   Shortness of Breath Yes;Limiting activity             Exercise Target Goals: Exercise Program Goal: Individual exercise prescription set using results from initial 6 min walk test and THRR while considering  patient's activity barriers and safety.   Exercise Prescription Goal: Initial exercise prescription builds to 30-45 minutes a day of aerobic activity, 2-3 days per week.  Home exercise guidelines will be given to patient during program as part of exercise prescription that the participant will acknowledge.  Education: Aerobic Exercise: - Group verbal and visual presentation on the components of exercise prescription. Introduces F.I.T.T principle from ACSM for exercise prescriptions.  Reviews F.I.T.T. principles of aerobic exercise including progression. Written material given at graduation. Flowsheet Row Pulmonary Rehab from 01/29/2023 in [facility] Cardiac and Pulmonary Rehab  Date 11/27/22  Educator KW  Instruction Review Code 1- [employer] Understanding       Education: Resistance Exercise: - Group verbal and visual presentation on the components of exercise prescription. Introduces F.I.T.T principle from ACSM for exercise prescriptions  Reviews F.I.T.T. principles of resistance exercise including progression. Written material given at graduation. Flowsheet Row Pulmonary Rehab from 01/29/2023 in [facility] Cardiac and Pulmonary Rehab  Date 12/04/22  Educator KW   Instruction Review Code 1- [employer] Understanding        Education: Exercise & Equipment Safety: - Individual verbal instruction and demonstration of equipment use and safety with use of the equipment. Flowsheet Row Pulmonary Rehab from 02/11/2024 in [facility] Cardiac and Pulmonary Rehab  Date 02/11/24  Educator [facility]  Instruction Review Code 1- Verbalizes Understanding       Education: Exercise Physiology & General Exercise Guidelines: - Group verbal and written instruction with models to review the exercise physiology of the cardiovascular system and associated critical values. Provides general exercise guidelines with specific guidelines to those with heart or lung disease.    Education: Flexibility, Balance, Mind/Body Relaxation: - Group verbal and visual presentation with interactive activity on the components of exercise prescription. Introduces F.I.T.T principle from ACSM for exercise prescriptions. Reviews F.I.T.T. principles of flexibility and balance exercise training including progression. Also discusses the mind body connection.  Reviews various relaxation techniques to help reduce and manage stress (i.e. Deep breathing, progressive muscle relaxation, and visualization). Balance handout provided to take home. Written material given at graduation. Flowsheet Row Pulmonary Rehab from 01/29/2023 in [facility] Cardiac and Pulmonary Rehab  Date 12/11/22  Educator KW  Instruction Review Code 1- Verbalizes Understanding       Activity Barriers & Risk Stratification:  Activity Barriers & Cardiac Risk Stratification - 02/11/24 1530       Activity Barriers & Cardiac Risk Stratification   Activity Barriers None             6 Minute Walk:  6 Minute Walk     Row Name 02/11/24 1528         6 Minute Walk   Phase Initial     Distance 755 feet     Walk Time 5.17 minutes     # of Rest Breaks 2     MPH 1.66     METS 2.7     RPE 12     Perceived Dyspnea  4     VO2 Peak 9.56      Symptoms Yes (comment)     Comments SOB     Resting HR 98 bpm  Resting BP 122/58     Resting Oxygen Saturation  97 %     Exercise Oxygen Saturation  during 6 min walk 95 %     Max Ex. HR 112 bpm     Max Ex. BP 162/64       Interval HR   1 Minute HR 102     2 Minute HR 103     3 Minute HR 107     4 Minute HR 110     5 Minute HR 106     6 Minute HR 112     2 Minute Post HR 106     Interval Heart Rate? Yes       Interval Oxygen   Interval Oxygen? Yes     Baseline Oxygen Saturation % 97 %     1 Minute Oxygen Saturation % 98 %     1 Minute Liters of Oxygen 2 L     2 Minute Oxygen Saturation % 97 %     2 Minute Liters of Oxygen 2 L     3 Minute Oxygen Saturation % 96 %     3 Minute Liters of Oxygen 2 L     4 Minute Oxygen Saturation % 96 %     4 Minute Liters of Oxygen 2 L     5 Minute Oxygen Saturation % 97 %     5 Minute Liters of Oxygen 2 L     6 Minute Oxygen Saturation % 95 %     6 Minute Liters of Oxygen 2 L     2 Minute Post Oxygen Saturation % 98 %     2 Minute Post Liters of Oxygen 2 L             Oxygen Initial Assessment:  Oxygen Initial Assessment - 02/11/24 1537       Home Oxygen   Home Oxygen Device Portable Concentrator;Home Concentrator    Sleep Oxygen Prescription Continuous    Liters per minute 2    Home Exercise Oxygen Prescription Continuous    Liters per minute 2    Home Resting Oxygen Prescription None    Compliance with Home Oxygen Use Yes      Initial 6 min Walk   Oxygen Used Continuous    Liters per minute 2      Program Oxygen Prescription   Program Oxygen Prescription Continuous    Liters per minute 2      Intervention   Short Term Goals To learn and exhibit compliance with exercise, home and travel O2 prescription;To learn and understand importance of monitoring SPO2 with pulse oximeter and demonstrate accurate use of the pulse oximeter.;To learn and demonstrate proper pursed lip breathing techniques or other breathing  techniques. ;To learn and demonstrate proper use of respiratory medications;To learn and understand importance of maintaining oxygen saturations>88%    Long  Term Goals Exhibits compliance with exercise, home  and travel O2 prescription;Maintenance of O2 saturations>88%;Compliance with respiratory medication;Demonstrates proper use of MDI's;Exhibits proper breathing techniques, such as pursed lip breathing or other method taught during program session;Verbalizes importance of monitoring SPO2 with pulse oximeter and return demonstration             Oxygen Re-Evaluation:   Oxygen Discharge (Final Oxygen Re-Evaluation):   Initial Exercise Prescription:  Initial Exercise Prescription - 02/11/24 1500       Date of Initial Exercise RX and Referring Provider   Date 02/11/24    Referring Provider Dr.  Herbon Fleming      Oxygen   Oxygen Continuous    Liters 2    Maintain Oxygen Saturation 88% or higher      Treadmill   MPH 1.5    Grade 0    Minutes 15    METs 2.15      NuStep   Level 2    SPM 80    Minutes 15    METs 2.7      REL-XR   Level 2    Watts 25    Speed 50    Minutes 15    METs 2.7      Track   Laps 20    Minutes 15    METs 2.09      Intensity   THRR 40-80% of Max Heartrate 122-147    Ratings of Perceived Exertion 11-13    Perceived Dyspnea 0-4      Resistance Training   Training Prescription Yes    Weight 3    Reps 10-15             Perform Capillary Blood Glucose checks as needed.  Exercise Prescription Changes:   Exercise Prescription Changes     Row Name 02/11/24 1500             Response to Exercise   Blood Pressure (Admit) 122/58       Blood Pressure (Exercise) 162/64       Blood Pressure (Exit) 142/60       Heart Rate (Admit) 98 bpm       Heart Rate (Exercise) 112 bpm       Heart Rate (Exit) 106 bpm       Oxygen Saturation (Admit) 97 %       Oxygen Saturation (Exercise) 95 %       Oxygen Saturation (Exit) 98 %        Rating of Perceived Exertion (Exercise) 12       Perceived Dyspnea (Exercise) 4       Symptoms SOB       Comments results                Exercise Comments:   Exercise Goals and Review:   Exercise Goals     Row Name 02/11/24 1535             Exercise Goals   Increase Physical Activity Yes       Intervention Provide advice, education, support and counseling about physical activity/exercise needs.;Develop an individualized exercise prescription for aerobic and resistive training based on initial evaluation findings, risk stratification, comorbidities and participant's personal goals.       Expected Outcomes Short Term: Attend rehab on a regular basis to increase amount of physical activity.;Long Term: Add in home exercise to make exercise part of routine and to increase amount of physical activity.;Long Term: Exercising regularly at least 3-5 days a week.       Increase Strength and Stamina Yes       Intervention Provide advice, education, support and counseling about physical activity/exercise needs.;Develop an individualized exercise prescription for aerobic and resistive training based on initial evaluation findings, risk stratification, comorbidities and participant's personal goals.       Expected Outcomes Short Term: Increase workloads from initial exercise prescription for resistance, speed, and METs.;Short Term: Perform resistance training exercises routinely during rehab and add in resistance training at home;Long Term: Improve cardiorespiratory fitness, muscular endurance and strength as measured by increased METs and  functional capacity ( )       Able to understand and use rate of perceived exertion (RPE) scale Yes       Intervention Provide education and explanation on how to use RPE scale       Expected Outcomes Short Term: Able to use RPE daily in rehab to express subjective intensity level;Long Term:  Able to use RPE to guide intensity level when exercising  independently       Able to understand and use Dyspnea scale Yes       Intervention Provide education and explanation on how to use Dyspnea scale       Expected Outcomes Short Term: Able to use Dyspnea scale daily in rehab to express subjective sense of shortness of breath during exertion;Long Term: Able to use Dyspnea scale to guide intensity level when exercising independently       Knowledge and understanding of Target Heart Rate Range (THRR) Yes       Intervention Provide education and explanation of THRR including how the numbers were predicted and where they are located for reference       Expected Outcomes Short Term: Able to state/look up THRR;Long Term: Able to use THRR to govern intensity when exercising independently;Short Term: Able to use daily as guideline for intensity in rehab       Able to check pulse independently Yes       Intervention Provide education and demonstration on how to check pulse in carotid and radial arteries.;Review the importance of being able to check your own pulse for safety during independent exercise       Expected Outcomes Short Term: Able to explain why pulse checking is important during independent exercise       Understanding of Exercise Prescription Yes       Intervention Provide education, explanation, and written materials on patient's individual exercise prescription       Expected Outcomes Short Term: Able to explain program exercise prescription;Long Term: Able to explain home exercise prescription to exercise independently                Exercise Goals Re-Evaluation :   Discharge Exercise Prescription (Final Exercise Prescription Changes):  Exercise Prescription Changes - 02/11/24 1500       Response to Exercise   Blood Pressure (Admit) 122/58    Blood Pressure (Exercise) 162/64    Blood Pressure (Exit) 142/60    Heart Rate (Admit) 98 bpm    Heart Rate (Exercise) 112 bpm    Heart Rate (Exit) 106 bpm    Oxygen Saturation (Admit) 97 %     Oxygen Saturation (Exercise) 95 %    Oxygen Saturation (Exit) 98 %    Rating of Perceived Exertion (Exercise) 12    Perceived Dyspnea (Exercise) 4    Symptoms SOB    Comments results             Nutrition:  Target Goals: Understanding of nutrition guidelines, daily intake of sodium 1500mg , cholesterol 200mg , calories 30% from fat and 7% or less from saturated fats, daily to have 5 or more servings of fruits and vegetables.  Education: All About Nutrition: -Group instruction provided by verbal, written material, interactive activities, discussions, models, and posters to present general guidelines for heart healthy nutrition including fat, fiber, MyPlate, the role of sodium in heart healthy nutrition, utilization of the nutrition label, and utilization of this knowledge for meal planning. Follow up email sent as well. Written material given at graduation.  Flowsheet Row Pulmonary Rehab from 01/29/2023 in [facility] Cardiac and Pulmonary Rehab  Education need identified 11/18/22       Biometrics:  Pre Biometrics - 02/11/24 1535       Pre Biometrics   Height 5' 7.55" (1.716 m)    Weight 201 lb 11.2 oz (91.5 kg)    Waist Circumference 39.5 inches    Hip Circumference 43 inches    Waist to Hip Ratio 0.92 %    BMI (Calculated) 31.07    Single Leg Stand 7.34 seconds              Nutrition Therapy Plan and Nutrition Goals:   Nutrition Assessments:  MEDIFICTS Score Key: >=70 Need to make dietary changes  40-70 Heart Healthy Diet <= 40 Therapeutic Level Cholesterol Diet  Flowsheet Row Pulmonary Rehab from 02/13/2023 in [facility] Cardiac and Pulmonary Rehab  Picture Your Plate Total Score on Discharge 56      Picture Your Plate Scores: <01 Unhealthy dietary pattern with much room for improvement. 41-50 Dietary pattern unlikely to meet recommendations for good health and room for improvement. 51-60 More healthful dietary pattern, with some room for improvement.  >60  Healthy dietary pattern, although there may be some specific behaviors that could be improved.   Nutrition Goals Re-Evaluation:   Nutrition Goals Discharge (Final Nutrition Goals Re-Evaluation):   Psychosocial: Target Goals: Acknowledge presence or absence of significant depression and/or stress, maximize coping skills, provide positive support system. Participant is able to verbalize types and ability to use techniques and skills needed for reducing stress and depression.   Education: Stress, Anxiety, and Depression - Group verbal and visual presentation to define topics covered.  Reviews how body is impacted by stress, anxiety, and depression.  Also discusses healthy ways to reduce stress and to treat/manage anxiety and depression.  Written material given at graduation. Flowsheet Row Pulmonary Rehab from 01/29/2023 in [facility] Cardiac and Pulmonary Rehab  Date 01/22/23  Educator KW  Instruction Review Code 1- [employer] Understanding       Education: Sleep Hygiene -Provides group verbal and written instruction about how sleep can affect your health.  Define sleep hygiene, discuss sleep cycles and impact of sleep habits. Review good sleep hygiene tips.    Initial Review & Psychosocial Screening:  Initial Psych Review & Screening - 12/24/23 1343       Initial Review   Current issues with None Identified      Family Dynamics   Good Support System? Yes    Comments She can look look to her sister for support. She lives alone and feels like she has no mental instability.      Barriers   Psychosocial barriers to participate in program The patient should benefit from training in stress management and relaxation.;There are no identifiable barriers or psychosocial needs.      Screening Interventions   Interventions Encouraged to exercise;Provide feedback about the scores to participant;Program counselor consult;To provide support and resources with identified psychosocial needs     Expected Outcomes Short Term goal: Utilizing psychosocial counselor, staff and physician to assist with identification of specific Stressors or current issues interfering with healing process. Setting desired goal for each stressor or current issue identified.;Long Term Goal: Stressors or current issues are controlled or eliminated.;Short Term goal: Identification and review with participant of any Quality of Life or Depression concerns found by scoring the questionnaire.;Long Term goal: The participant improves quality of Life and PHQ9 Scores as seen by post scores and/or verbalization  of changes             Quality of Life Scores:  Scores of 19 and below usually indicate a poorer quality of life in these areas.  A difference of  2-3 points is a clinically meaningful difference.  A difference of 2-3 points in the total score of the Quality of Life Index has been associated with significant improvement in overall quality of life, self-image, physical symptoms, and general health in studies assessing change in quality of life.  PHQ-9: Review Flowsheet  More data exists      02/11/2024 08/20/2023 02/13/2023 01/28/2023 11/18/2022  Depression screen PHQ 2/9  Decreased Interest 0 0 1 0 1  Down, Depressed, Hopeless 0 0 0 0 0  PHQ - 2 Score 0 0 1 0 1  Altered sleeping 0 1 0 0 0  Tired, decreased energy 0 1 3 3 3   Change in appetite 0 0 0 0 0  Feeling bad or failure about yourself  0 0 0 0 0  Trouble concentrating 0 0 0 0 0  Moving slowly or fidgety/restless 0 0 0 0 0  Suicidal thoughts 0 0 0 0 0  PHQ-9 Score 0 2 4 3 4   Difficult doing work/chores - - Somewhat difficult Not difficult at all -   Interpretation of Total Score  Total Score Depression Severity:  1-4 = Minimal depression, 5-9 = Mild depression, 10-14 = Moderate depression, 15-19 = Moderately severe depression, 20-27 = Severe depression   Psychosocial Evaluation and Intervention:  Psychosocial Evaluation - 12/24/23 1344        Psychosocial Evaluation & Interventions   Interventions Encouraged to exercise with the program and follow exercise prescription    Comments She can look look to her sister for support. She lives alone and feels like she has no mental instability.    Expected Outcomes Short: attend pulmonary rehab for education and exercise. Long: develop and maintain positive self care habits.    Continue Psychosocial Services  Follow up required by staff             Psychosocial Re-Evaluation:   Psychosocial Discharge (Final Psychosocial Re-Evaluation):   Education: Education Goals: Education classes will be provided on a weekly basis, covering required topics. Participant will state understanding/return demonstration of topics presented.  Learning Barriers/Preferences:   General Pulmonary Education Topics:  Infection Prevention: - Provides verbal and written material to individual with discussion of infection control including proper hand washing and proper equipment cleaning during exercise session. Flowsheet Row Pulmonary Rehab from 02/11/2024 in [facility] Cardiac and Pulmonary Rehab  Date 02/11/24  Educator [facility]  Instruction Review Code 1- Verbalizes Understanding       Falls Prevention: - Provides verbal and written material to individual with discussion of falls prevention and safety. Flowsheet Row Pulmonary Rehab from 02/11/2024 in [facility] Cardiac and Pulmonary Rehab  Date 02/11/24  Educator [facility]  Instruction Review Code 1- Verbalizes Understanding       Chronic Lung Disease Review: - Group verbal instruction with posters, models, PowerPoint presentations and videos,  to review new updates, new respiratory medications, new advancements in procedures and treatments. Providing information on websites and "800" numbers for continued self-education. Includes information about supplement oxygen, available portable oxygen systems, continuous and intermittent flow rates, oxygen safety, concentrators,  and Medicare reimbursement for oxygen. Explanation of Pulmonary Drugs, including class, frequency, complications, importance of spacers, rinsing mouth after steroid MDI's, and proper cleaning methods for nebulizers. Review of basic lung anatomy and  physiology related to function, structure, and complications of lung disease. Review of risk factors. Discussion about methods for diagnosing sleep apnea and types of masks and machines for OSA. Includes a review of the use of types of environmental controls: home humidity, furnaces, filters, dust mite/pet prevention, HEPA vacuums. Discussion about weather changes, air quality and the benefits of nasal washing. Instruction on Warning signs, infection symptoms, calling MD promptly, preventive modes, and value of vaccinations. Review of effective airway clearance, coughing and/or vibration techniques. Emphasizing that all should Create an Action Plan. Written material given at graduation. Flowsheet Row Pulmonary Rehab from 01/29/2023 in [facility] Cardiac and Pulmonary Rehab  Education need identified 11/18/22  Date 01/15/23  Educator [facility]  Instruction Review Code 1- Verbalizes Understanding       AED/CPR: - Group verbal and written instruction with the use of models to demonstrate the basic use of the AED with the basic ABC's of resuscitation.    Anatomy and Cardiac Procedures: - Group verbal and visual presentation and models provide information about basic cardiac anatomy and function. Reviews the testing methods done to diagnose heart disease and the outcomes of the test results. Describes the treatment choices: Medical Management, Angioplasty, or Coronary Bypass Surgery for treating various heart conditions including Myocardial Infarction, Angina, Valve Disease, and Cardiac Arrhythmias.  Written material given at graduation. Flowsheet Row Pulmonary Rehab from 01/29/2023 in [facility] Cardiac and Pulmonary Rehab  Date 12/18/22  Educator SB  Instruction Review Code  1- Verbalizes Understanding       Medication Safety: - Group verbal and visual instruction to review commonly prescribed medications for heart and lung disease. Reviews the medication, class of the drug, and side effects. Includes the steps to properly store meds and maintain the prescription regimen.  Written material given at graduation.   Other: -Provides group and verbal instruction on various topics (see comments) Flowsheet Row Pulmonary Rehab from 01/29/2023 in [facility] Cardiac and Pulmonary Rehab  Date 12/25/22  Educator SB  Instruction Review Code 1- Verbalizes Understanding  [Jeopardy]       Knowledge Questionnaire Score:    Core Components/Risk Factors/Patient Goals at Admission:  Personal Goals and Risk Factors at Admission - 12/24/23 1342       Core Components/Risk Factors/Patient Goals on Admission    Weight Management Yes;Weight Loss    Intervention Weight Management: Develop a combined nutrition and exercise program designed to reach desired caloric intake, while maintaining appropriate intake of nutrient and fiber, sodium and fats, and appropriate energy expenditure required for the weight goal.;Weight Management: Provide education and appropriate resources to help participant work on and attain dietary goals.;Weight Management/Obesity: Establish reasonable short term and long term weight goals.;Obesity: Provide education and appropriate resources to help participant work on and attain dietary goals.    Expected Outcomes Short Term: Continue to assess and modify interventions until short term weight is achieved;Long Term: Adherence to nutrition and physical activity/exercise program aimed toward attainment of established weight goal;Weight Loss: Understanding of general recommendations for a balanced deficit meal plan, which promotes 1-2 lb weight loss per week and includes a negative energy balance of [phone] kcal/d;Understanding recommendations for meals to include 15-35%  energy as protein, 25-35% energy from fat, 35-60% energy from carbohydrates, less than 200mg  of dietary cholesterol, 20-35 gm of total fiber daily;Understanding of distribution of calorie intake throughout the day with the consumption of 4-5 meals/snacks    Improve shortness of breath with ADL's Yes    Intervention Provide education, individualized exercise plan and  daily activity instruction to help decrease symptoms of SOB with activities of daily living.    Expected Outcomes Short Term: Improve cardiorespiratory fitness to achieve a reduction of symptoms when performing ADLs;Long Term: Be able to perform more ADLs without symptoms or delay the onset of symptoms    Hypertension Yes    Intervention Provide education on lifestyle modifcations including regular physical activity/exercise, weight management, moderate sodium restriction and increased consumption of fresh fruit, vegetables, and low fat dairy, alcohol moderation, and smoking cessation.;Monitor prescription use compliance.    Expected Outcomes Short Term: Continued assessment and intervention until BP is < 140/82mm HG in hypertensive participants. < 130/51mm HG in hypertensive participants with diabetes, heart failure or chronic kidney disease.;Long Term: Maintenance of blood pressure at goal levels.             Education:Diabetes - Individual verbal and written instruction to review signs/symptoms of diabetes, desired ranges of glucose level fasting, after meals and with exercise. Acknowledge that pre and post exercise glucose checks will be done for 3 sessions at entry of program.   Know Your Numbers and Heart Failure: - Group verbal and visual instruction to discuss disease risk factors for cardiac and pulmonary disease and treatment options.  Reviews associated critical values for Overweight/Obesity, Hypertension, Cholesterol, and Diabetes.  Discusses basics of heart failure: signs/symptoms and treatments.  Introduces Heart Failure  Zone chart for action plan for heart failure.  Written material given at graduation.   Core Components/Risk Factors/Patient Goals Review:    Core Components/Risk Factors/Patient Goals at Discharge (Final Review):    ITP Comments:  ITP Comments     Row Name 12/24/23 1350 02/11/24 1527 02/18/24 1125       ITP Comments Virtual Visit completed. Patient informed on EP and RD appointment and 6 Minute walk test. Patient also informed of patient health questionnaires on My Chart. Patient Verbalizes understanding. Visit diagnosis can be found in [facility] 12/08/2023. Completed and gym orientation. Initial ITP created and sent for review to Dr. Jinny Sanders, Medical Director. 30 Day review completed. Medical Director ITP review done, changes made as directed, and signed approval by Medical Director.    new to program              Comments:

## 2024-02-19 ENCOUNTER — Encounter: Admitting: *Deleted

## 2024-02-19 DIAGNOSIS — J449 Chronic obstructive pulmonary disease, unspecified: Secondary | ICD-10-CM | POA: Diagnosis not present

## 2024-02-19 NOTE — Progress Notes (Signed)
 Daily Session Note  Patient Details  Name: Nancy [name] MRN: [id number] Date of Birth: August 06, 1961 Referring Provider:   Flowsheet Row Pulmonary Rehab from 02/11/2024 in [facility] Cardiac and Pulmonary Rehab  Referring Provider Dr. Ned Clines       Encounter Date: 02/19/2024  Check In:  Session Check In - 02/19/24 1530       Check-In   Supervising physician immediately available to respond to emergencies See telemetry face sheet for immediately available ER MD    Location [facility]-Cardiac & Pulmonary Rehab    Staff Present Susann Givens RN,BSN;[facility] BS, Exercise Physiologist;Noah Tickle, BS, Exercise Physiologist    Virtual Visit No    Medication changes reported     No    Fall or balance concerns reported    No    Warm-up and Cool-down Performed on first and last piece of equipment    Resistance Training Performed Yes    VAD Patient? No    PAD/SET Patient? No      Pain Assessment   Currently in Pain? No/denies                Social History   Tobacco Use  Smoking Status Former   Current packs/day: 0.00   Average packs/day: 0.5 packs/day for 30.0 years (15.0 ttl pk-yrs)   Types: Cigarettes   Start date: 03/11/1986   Quit date: 03/11/2016   Years since quitting: 7.9  Smokeless Tobacco Never  Tobacco Comments   patient states  that she is working at her own pace to quit smoking    Goals Met:  Independence with exercise equipment Exercise tolerated well No report of concerns or symptoms today Strength training completed today  Goals Unmet:  Not Applicable  Comments: Pt able to follow exercise prescription today without complaint.  Will continue to monitor for progression.    Dr. Bethann Punches is Medical Director for [facility] Cardiac Rehabilitation.  Dr. Vida Rigger is Medical Director for [facility] Pulmonary Rehabilitation.

## 2024-02-25 ENCOUNTER — Encounter: Admitting: *Deleted

## 2024-02-25 DIAGNOSIS — J449 Chronic obstructive pulmonary disease, unspecified: Secondary | ICD-10-CM | POA: Diagnosis not present

## 2024-02-25 NOTE — Progress Notes (Signed)
 Daily Session Note  Patient Details  Name: Nancy [name] MRN: [id number] Date of Birth: 05/29/1961 Referring Provider:   Flowsheet Row Pulmonary Rehab from 02/11/2024 in [facility] Cardiac and Pulmonary Rehab  Referring Provider Dr. Alain Aliment       Encounter Date: 02/25/2024  Check In:  Session Check In - 02/25/24 1526       Check-In   Supervising physician immediately available to respond to emergencies See telemetry face sheet for immediately available ER MD    Location [facility]-Cardiac & Pulmonary Rehab    Staff Present Sue Em RN,BSN;Kelly Sabra Cramp BS, ACSM CEP, Exercise Physiologist;Maxon Conetta BS, Exercise Physiologist;Noah Tickle, BS, Exercise Physiologist    Virtual Visit No    Medication changes reported     No    Fall or balance concerns reported    No    Warm-up and Cool-down Performed on first and last piece of equipment    Resistance Training Performed Yes    VAD Patient? No    PAD/SET Patient? No      Pain Assessment   Currently in Pain? No/denies                Social History   Tobacco Use  Smoking Status Former   Current packs/day: 0.00   Average packs/day: 0.5 packs/day for 30.0 years (15.0 ttl pk-yrs)   Types: Cigarettes   Start date: 03/11/1986   Quit date: 03/11/2016   Years since quitting: 7.9  Smokeless Tobacco Never  Tobacco Comments   patient states  that she is working at her own pace to quit smoking    Goals Met:  Independence with exercise equipment Exercise tolerated well No report of concerns or symptoms today Strength training completed today  Goals Unmet:  Not Applicable  Comments: Pt able to follow exercise prescription today without complaint.  Will continue to monitor for progression.    Dr. Firman Hughes is Medical Director for [facility] Cardiac Rehabilitation.  Dr. Fuad Aleskerov is Medical Director for [facility] Pulmonary Rehabilitation.

## 2024-02-26 ENCOUNTER — Encounter: Admitting: *Deleted

## 2024-02-26 DIAGNOSIS — J449 Chronic obstructive pulmonary disease, unspecified: Secondary | ICD-10-CM

## 2024-02-26 NOTE — Progress Notes (Signed)
 Daily Session Note  Patient Details  Name: Nancy [name] MRN: [id number] Date of Birth: February 03, 1961 Referring Provider:   Flowsheet Row Pulmonary Rehab from 02/11/2024 in [facility] Cardiac and Pulmonary Rehab  Referring Provider Dr. Alain Aliment       Encounter Date: 02/26/2024  Check In:  Session Check In - 02/26/24 1526       Check-In   Supervising physician immediately available to respond to emergencies Virtual Visit, No Supervising Physician Required.    Location [facility]-Cardiac & Pulmonary Rehab    Staff Present Sue Em RN,BSN;Maxon Conetta BS, Exercise Physiologist;Noah Tickle, BS, Exercise Physiologist;Laureen Bevin Bucks, BS, RRT, CPFT    Virtual Visit No    Medication changes reported     No    Fall or balance concerns reported    No    Warm-up and Cool-down Performed on first and last piece of equipment    Resistance Training Performed Yes    VAD Patient? No    PAD/SET Patient? No      Pain Assessment   Currently in Pain? No/denies                Social History   Tobacco Use  Smoking Status Former   Current packs/day: 0.00   Average packs/day: 0.5 packs/day for 30.0 years (15.0 ttl pk-yrs)   Types: Cigarettes   Start date: 03/11/1986   Quit date: 03/11/2016   Years since quitting: 7.9  Smokeless Tobacco Never  Tobacco Comments   patient states  that she is working at her own pace to quit smoking    Goals Met:  Independence with exercise equipment Exercise tolerated well No report of concerns or symptoms today Strength training completed today  Goals Unmet:  Not Applicable  Comments: Pt able to follow exercise prescription today without complaint.  Will continue to monitor for progression.    Dr. Firman Hughes is Medical Director for [facility] Cardiac Rehabilitation.  Dr. Fuad Aleskerov is Medical Director for [facility] Pulmonary Rehabilitation.

## 2024-03-03 ENCOUNTER — Encounter

## 2024-03-04 ENCOUNTER — Encounter

## 2024-03-10 ENCOUNTER — Encounter: Admitting: *Deleted

## 2024-03-10 DIAGNOSIS — J449 Chronic obstructive pulmonary disease, unspecified: Secondary | ICD-10-CM

## 2024-03-10 NOTE — Progress Notes (Signed)
 Daily Session Note  Patient Details  Name: Nancy [name] MRN: [id number] Date of Birth: 1961/06/14 Referring Provider:   Flowsheet Row Pulmonary Rehab from 02/11/2024 in [facility] Cardiac and Pulmonary Rehab  Referring Provider Dr. Alain Aliment       Encounter Date: 03/10/2024  Check In:  Session Check In - 03/10/24 1546       Check-In   Supervising physician immediately available to respond to emergencies See telemetry face sheet for immediately available ER MD    Location [facility]-Cardiac & Pulmonary Rehab    Staff Present Sue Em RN,BSN;Joseph Nancey Awkward;Maud Sorenson, RN, BSN, [employer], ACSM CEP, Exercise Physiologist    Virtual Visit No    Medication changes reported     No    Fall or balance concerns reported    No    Warm-up and Cool-down Performed on first and last piece of equipment    Resistance Training Performed Yes    VAD Patient? No    PAD/SET Patient? No      Pain Assessment   Currently in Pain? No/denies                Social History   Tobacco Use  Smoking Status Former   Current packs/day: 0.00   Average packs/day: 0.5 packs/day for 30.0 years (15.0 ttl pk-yrs)   Types: Cigarettes   Start date: 03/11/1986   Quit date: 03/11/2016   Years since quitting: 8.0  Smokeless Tobacco Never  Tobacco Comments   patient states  that she is working at her own pace to quit smoking    Goals Met:  Independence with exercise equipment Exercise tolerated well No report of concerns or symptoms today Strength training completed today  Goals Unmet:  Not Applicable  Comments: Pt able to follow exercise prescription today without complaint.  Will continue to monitor for progression.    Dr. Firman Hughes is Medical Director for [facility] Cardiac Rehabilitation.  Dr. Fuad Aleskerov is Medical Director for [facility] Pulmonary Rehabilitation.

## 2024-03-11 ENCOUNTER — Encounter: Attending: Specialist | Admitting: *Deleted

## 2024-03-11 DIAGNOSIS — J449 Chronic obstructive pulmonary disease, unspecified: Secondary | ICD-10-CM | POA: Diagnosis present

## 2024-03-11 NOTE — Progress Notes (Signed)
 Daily Session Note  Patient Details  Name: Nancy [name] MRN: [id number] Date of Birth: 16-Aug-1961 Referring Provider:   Flowsheet Row Pulmonary Rehab from 02/11/2024 in [facility] Cardiac and Pulmonary Rehab  Referring Provider Dr. Alain Aliment       Encounter Date: 03/11/2024  Check In:  Session Check In - 03/11/24 1529       Check-In   Supervising physician immediately available to respond to emergencies See telemetry face sheet for immediately available ER MD    Location [facility]-Cardiac & Pulmonary Rehab    Staff Present Sue Em RN,BSN;Joseph Nancey Awkward;Maud Sorenson, RN, BSN, CCRP;Maxon Conetta BS, Exercise Physiologist    Virtual Visit No    Medication changes reported     No    Fall or balance concerns reported    No    Warm-up and Cool-down Performed on first and last piece of equipment    Resistance Training Performed Yes    VAD Patient? No    PAD/SET Patient? No      Pain Assessment   Currently in Pain? No/denies                Social History   Tobacco Use  Smoking Status Former   Current packs/day: 0.00   Average packs/day: 0.5 packs/day for 30.0 years (15.0 ttl pk-yrs)   Types: Cigarettes   Start date: 03/11/1986   Quit date: 03/11/2016   Years since quitting: 8.0  Smokeless Tobacco Never  Tobacco Comments   patient states  that she is working at her own pace to quit smoking    Goals Met:  Independence with exercise equipment Exercise tolerated well No report of concerns or symptoms today Strength training completed today  Goals Unmet:  Not Applicable  Comments: Pt able to follow exercise prescription today without complaint.  Will continue to monitor for progression.    Dr. Firman Hughes is Medical Director for [facility] Cardiac Rehabilitation.  Dr. Fuad Aleskerov is Medical Director for [facility] Pulmonary Rehabilitation.

## 2024-03-17 ENCOUNTER — Encounter: Admitting: *Deleted

## 2024-03-17 DIAGNOSIS — J449 Chronic obstructive pulmonary disease, unspecified: Secondary | ICD-10-CM

## 2024-03-17 NOTE — Progress Notes (Signed)
 Pulmonary Individual Treatment Plan  Patient Details  Name: Nancy [name] MRN: [id number] Date of Birth: [birth year] Referring Provider:   Flowsheet Row Pulmonary Rehab from 02/11/2024 in [facility] Cardiac and Pulmonary Rehab  Referring Provider Dr. Alain Aliment       Initial Encounter Date:  Flowsheet Row Pulmonary Rehab from 02/11/2024 in [facility] Cardiac and Pulmonary Rehab  Date 02/11/24       Visit Diagnosis: Stage 3 severe COPD by GOLD classification (HCC)  Patient's Home Medications on Admission:  Current Outpatient Medications:    acetaminophen  (TYLENOL ) 500 MG tablet, Take 500 mg by mouth every 4 (four) hours as needed., Disp: , Rfl:    albuterol  (VENTOLIN  HFA) 108 (90 Base) MCG/ACT inhaler, SMARTSIG:2 Inhalation Via Inhaler Every 6 Hours PRN, Disp: , Rfl:    amLODipine  (NORVASC ) 5 MG tablet, Take 5 mg by mouth daily., Disp: , Rfl:    benzonatate  (TESSALON ) 200 MG capsule, Take 1 capsule (200 mg total) by mouth 2 (two) times daily as needed for cough., Disp: 20 capsule, Rfl: 0   diclofenac  Sodium (VOLTAREN ) 1 % GEL, Apply 2 g topically 4 (four) times daily., Disp: 100 g, Rfl: 0   doxycycline  (PERIOSTAT ) 20 MG tablet, Take one tab po BID with food., Disp: 60 tablet, Rfl: 11   DULoxetine  (CYMBALTA ) 20 MG capsule, Take 2 capsules (40 mg total) by mouth 2 (two) times daily., Disp: 360 capsule, Rfl: 1   enoxaparin  (LOVENOX ) 100 MG/ML injection, Inject into the skin. (Patient not taking: Reported on 12/24/2023), Disp: , Rfl:    enoxaparin  (LOVENOX ) 40 MG/0.4ML injection, Inject 90 mg into the skin daily. (Patient not taking: Reported on 12/24/2023), Disp: , Rfl:    Fluticasone -Umeclidin-Vilant (TRELEGY ELLIPTA ) 100-62.5-25 MCG/ACT AEPB, Inhale into the lungs., Disp: , Rfl:    furosemide  (LASIX ) 40 MG tablet, Take 40 mg by mouth 2 (two) times daily. (Patient not taking: Reported on 12/24/2023), Disp: , Rfl:    furosemide  (LASIX ) 40 MG tablet, Take 1 tablet by mouth 2 (two) times daily., Disp: ,  Rfl:    ipratropium-albuterol  (DUONEB) 0.5-2.5 (3) MG/3ML SOLN, Inhale into the lungs., Disp: , Rfl:    levofloxacin  (LEVAQUIN ) 750 MG tablet, Take 1 tablet (750 mg total) by mouth daily., Disp: 7 tablet, Rfl: 0   levothyroxine  (SYNTHROID ) 125 MCG tablet, Take 1 tablet (125 mcg total) by mouth daily before breakfast., Disp: 90 tablet, Rfl: 3   lidocaine  (LIDODERM ) 5 %, Place 1 patch onto the skin every 12 (twelve) hours. Remove & Discard patch within 12 hours or as directed by MD, Disp: 15 patch, Rfl: 0   metoprolol  succinate (TOPROL -XL) 25 MG 24 hr tablet, Take 25 mg by mouth daily., Disp: , Rfl:    Na Sulfate-K Sulfate-Mg Sulfate concentrate 17.5-3.13-1.6 GM/177ML SOLN, , Disp: , Rfl:    ondansetron  (ZOFRAN  ODT) 4 MG disintegrating tablet, Take 1 tablet (4 mg total) by mouth every 8 (eight) hours as needed for nausea or vomiting., Disp: 20 tablet, Rfl: 2   oxyCODONE  (OXY IR/ROXICODONE ) 5 MG immediate release tablet, Take 1 tablet (5 mg total) by mouth every 6 (six) hours as needed for moderate pain., Disp: 30 tablet, Rfl: 0   polyethylene glycol (MIRALAX  / GLYCOLAX ) 17 g packet, Take 17 g by mouth daily as needed., Disp: , Rfl:    senna-docusate (SENOKOT-S) 8.6-50 MG tablet, Take 1 tablet by mouth 2 (two) times daily as needed for mild constipation., Disp: 20 tablet, Rfl: 0   TRELEGY ELLIPTA  100-62.5-25 MCG/ACT AEPB,  Inhale into the lungs., Disp: , Rfl:    valACYclovir  (VALTREX ) 1000 MG tablet, Take 1 tablet (1,000 mg total) by mouth daily., Disp: 6 tablet, Rfl: 0   warfarin (COUMADIN ) 5 MG tablet, Take 5 mg by mouth every evening. Per cardiologist: patient supposed to take 7.5 mg 9/24 and 9/25 evening for low INR then resume 5 mg nightly  Goal INR 2.5-3.5, Disp: , Rfl:   Current Facility-Administered Medications:    ipratropium-albuterol  (DUONEB) 0.5-2.5 (3) MG/3ML nebulizer solution 3 mL, 3 mL, Nebulization, Once, Pollak, Adriana M, PA-C  Past Medical History: Past Medical History:  Diagnosis  Date   Aortic dissection (HCC) 2010   COPD (chronic obstructive pulmonary disease) (HCC)    Depression    Hypertension    Hypothyroidism    Pneumonia    PONV (postoperative nausea and vomiting)    Rosacea    Seasonal allergies    Sleep apnea    Does not use CPAP    Tobacco Use: Social History   Tobacco Use  Smoking Status Former   Current packs/day: 0.00   Average packs/day: 0.5 packs/day for 30.0 years (15.0 ttl pk-yrs)   Types: Cigarettes   Start date: 03/11/1986   Quit date: 03/11/2016   Years since quitting: 8.0  Smokeless Tobacco Never  Tobacco Comments   patient states  that she is working at her own pace to quit smoking    Labs: Review Flowsheet  More data may exist      Latest Ref Rng & Units 07/02/2009 08/29/2015 01/05/2018 09/29/2020 08/20/2023  Labs for ITP Cardiac and Pulmonary Rehab  Cholestrol 100 - 199 mg/dL - 846  962  952  841   LDL (calc) 0 - 99 mg/dL - 324  401  027  253   HDL-C >39 mg/dL - 53  50  45  38   Trlycerides 0 - 149 mg/dL - 664  403  474  259   Hemoglobin A1c 4.8 - 5.6 % - - - - 4.8   TCO2 0 - 100 mmol/L 22  - - - -     Pulmonary Assessment Scores:  Pulmonary Assessment Scores     Row Name 02/11/24 1537         mMRC Score   mMRC Score 3              UCSD: Self-administered rating of dyspnea associated with activities of daily living (ADLs) 6-point scale (0 = "not at all" to 5 = "maximal or unable to do because of breathlessness")  Scoring Scores range from 0 to 120.  Minimally important difference is 5 units  CAT: CAT can identify the health impairment of COPD patients and is better correlated with disease progression.  CAT has a scoring range of zero to 40. The CAT score is classified into four groups of low (less than 10), medium (10 - 20), high (21-30) and very high (31-40) based on the impact level of disease on health status. A CAT score over 10 suggests significant symptoms.  A worsening CAT score could be explained by an  exacerbation, poor medication adherence, poor inhaler technique, or progression of COPD or comorbid conditions.  CAT MCID is 2 points  mMRC: mMRC (Modified Medical Research Council) Dyspnea Scale is used to assess the degree of baseline functional disability in patients of respiratory disease due to dyspnea. No minimal important difference is established. A decrease in score of 1 point or greater is considered a positive change.   Pulmonary  Function Assessment:  Pulmonary Function Assessment - 12/24/23 1342       Breath   Shortness of Breath Yes;Limiting activity             Exercise Target Goals: Exercise Program Goal: Individual exercise prescription set using results from initial 6 min walk test and THRR while considering  patient's activity barriers and safety.   Exercise Prescription Goal: Initial exercise prescription builds to 30-45 minutes a day of aerobic activity, 2-3 days per week.  Home exercise guidelines will be given to patient during program as part of exercise prescription that the participant will acknowledge.  Education: Aerobic Exercise: - Group verbal and visual presentation on the components of exercise prescription. Introduces F.I.T.T principle from ACSM for exercise prescriptions.  Reviews F.I.T.T. principles of aerobic exercise including progression. Written material given at graduation. Flowsheet Row Pulmonary Rehab from 01/29/2023 in [facility] Cardiac and Pulmonary Rehab  Date 11/27/22  Educator KW  Instruction Review Code 1- [employer] Understanding       Education: Resistance Exercise: - Group verbal and visual presentation on the components of exercise prescription. Introduces F.I.T.T principle from ACSM for exercise prescriptions  Reviews F.I.T.T. principles of resistance exercise including progression. Written material given at graduation. Flowsheet Row Pulmonary Rehab from 01/29/2023 in [facility] Cardiac and Pulmonary Rehab  Date 12/04/22  Educator KW   Instruction Review Code 1- [employer] Understanding        Education: Exercise & Equipment Safety: - Individual verbal instruction and demonstration of equipment use and safety with use of the equipment. Flowsheet Row Pulmonary Rehab from 02/11/2024 in [facility] Cardiac and Pulmonary Rehab  Date 02/11/24  Educator [facility]  Instruction Review Code 1- Verbalizes Understanding       Education: Exercise Physiology & General Exercise Guidelines: - Group verbal and written instruction with models to review the exercise physiology of the cardiovascular system and associated critical values. Provides general exercise guidelines with specific guidelines to those with heart or lung disease.    Education: Flexibility, Balance, Mind/Body Relaxation: - Group verbal and visual presentation with interactive activity on the components of exercise prescription. Introduces F.I.T.T principle from ACSM for exercise prescriptions. Reviews F.I.T.T. principles of flexibility and balance exercise training including progression. Also discusses the mind body connection.  Reviews various relaxation techniques to help reduce and manage stress (i.e. Deep breathing, progressive muscle relaxation, and visualization). Balance handout provided to take home. Written material given at graduation. Flowsheet Row Pulmonary Rehab from 01/29/2023 in [facility] Cardiac and Pulmonary Rehab  Date 12/11/22  Educator KW  Instruction Review Code 1- Verbalizes Understanding       Activity Barriers & Risk Stratification:  Activity Barriers & Cardiac Risk Stratification - 02/11/24 1530       Activity Barriers & Cardiac Risk Stratification   Activity Barriers None             6 Minute Walk:  6 Minute Walk     Row Name 02/11/24 1528         6 Minute Walk   Phase Initial     Distance 755 feet     Walk Time 5.17 minutes     # of Rest Breaks 2     MPH 1.66     METS 2.7     RPE 12     Perceived Dyspnea  4     VO2 Peak 9.56      Symptoms Yes (comment)     Comments SOB     Resting HR 98 bpm  Resting BP 122/58     Resting Oxygen  Saturation  97 %     Exercise Oxygen  Saturation  during 6 min walk 95 %     Max Ex. HR 112 bpm     Max Ex. BP 162/64       Interval HR   1 Minute HR 102     2 Minute HR 103     3 Minute HR 107     4 Minute HR 110     5 Minute HR 106     6 Minute HR 112     2 Minute Post HR 106     Interval Heart Rate? Yes       Interval Oxygen    Interval Oxygen ? Yes     Baseline Oxygen  Saturation % 97 %     1 Minute Oxygen  Saturation % 98 %     1 Minute Liters of Oxygen  2 L     2 Minute Oxygen  Saturation % 97 %     2 Minute Liters of Oxygen  2 L     3 Minute Oxygen  Saturation % 96 %     3 Minute Liters of Oxygen  2 L     4 Minute Oxygen  Saturation % 96 %     4 Minute Liters of Oxygen  2 L     5 Minute Oxygen  Saturation % 97 %     5 Minute Liters of Oxygen  2 L     6 Minute Oxygen  Saturation % 95 %     6 Minute Liters of Oxygen  2 L     2 Minute Post Oxygen  Saturation % 98 %     2 Minute Post Liters of Oxygen  2 L             Oxygen  Initial Assessment:  Oxygen  Initial Assessment - 02/11/24 1537       Home Oxygen    Home Oxygen  Device Portable Concentrator;Home Concentrator    Sleep Oxygen  Prescription Continuous    Liters per minute 2    Home Exercise Oxygen  Prescription Continuous    Liters per minute 2    Home Resting Oxygen  Prescription None    Compliance with Home Oxygen  Use Yes      Initial 6 min Walk   Oxygen  Used Continuous    Liters per minute 2      Program Oxygen  Prescription   Program Oxygen  Prescription Continuous    Liters per minute 2      Intervention   Short Term Goals To learn and exhibit compliance with exercise, home and travel O2 prescription;To learn and understand importance of monitoring SPO2 with pulse oximeter and demonstrate accurate use of the pulse oximeter.;To learn and demonstrate proper pursed lip breathing techniques or other breathing  techniques. ;To learn and demonstrate proper use of respiratory medications;To learn and understand importance of maintaining oxygen  saturations>88%    Long  Term Goals Exhibits compliance with exercise, home  and travel O2 prescription;Maintenance of O2 saturations>88%;Compliance with respiratory medication;Demonstrates proper use of MDI's;Exhibits proper breathing techniques, such as pursed lip breathing or other method taught during program session;Verbalizes importance of monitoring SPO2 with pulse oximeter and return demonstration             Oxygen  Re-Evaluation:  Oxygen  Re-Evaluation     Row Name 02/18/24 1555 03/10/24 1546           Program Oxygen  Prescription   Program Oxygen  Prescription -- Continuous      Liters per minute -- 2  Home Oxygen    Home Oxygen  Device -- Portable Concentrator;Home Concentrator      Sleep Oxygen  Prescription -- Continuous      Liters per minute -- 2      Home Exercise Oxygen  Prescription -- Continuous      Liters per minute -- 2      Home Resting Oxygen  Prescription -- None        Goals/Expected Outcomes   Short Term Goals -- To learn and exhibit compliance with exercise, home and travel O2 prescription;To learn and understand importance of monitoring SPO2 with pulse oximeter and demonstrate accurate use of the pulse oximeter.;To learn and demonstrate proper pursed lip breathing techniques or other breathing techniques. ;To learn and demonstrate proper use of respiratory medications;To learn and understand importance of maintaining oxygen  saturations>88%      Long  Term Goals -- Exhibits compliance with exercise, home  and travel O2 prescription;Maintenance of O2 saturations>88%;Compliance with respiratory medication;Demonstrates proper use of MDI's;Exhibits proper breathing techniques, such as pursed lip breathing or other method taught during program session;Verbalizes importance of monitoring SPO2 with pulse oximeter and return  demonstration      Comments Reviewed PLB technique with pt.  Talked about how it works and it's importance in maintaining their exercise saturations. Nancy [name] continues to practice PLB technique, although she reports that she is still SOB even when wearing her O2. She states that she feels the same SOB while wearing her oxygen  even though her O2 saturations have stayed above 90% while on continuous O2. Talked about how PLB works and it's importance in maintaining their exercise saturations.      Goals/Expected Outcomes Short: Become more profiecient at using PLB.   Long: Become independent at using PLB. Short: Become more profiecient at using PLB.   Long: Become independent at using PLB.               Oxygen  Discharge (Final Oxygen  Re-Evaluation):  Oxygen  Re-Evaluation - 03/10/24 1546       Program Oxygen  Prescription   Program Oxygen  Prescription Continuous    Liters per minute 2      Home Oxygen    Home Oxygen  Device Portable Concentrator;Home Concentrator    Sleep Oxygen  Prescription Continuous    Liters per minute 2    Home Exercise Oxygen  Prescription Continuous    Liters per minute 2    Home Resting Oxygen  Prescription None      Goals/Expected Outcomes   Short Term Goals To learn and exhibit compliance with exercise, home and travel O2 prescription;To learn and understand importance of monitoring SPO2 with pulse oximeter and demonstrate accurate use of the pulse oximeter.;To learn and demonstrate proper pursed lip breathing techniques or other breathing techniques. ;To learn and demonstrate proper use of respiratory medications;To learn and understand importance of maintaining oxygen  saturations>88%    Long  Term Goals Exhibits compliance with exercise, home  and travel O2 prescription;Maintenance of O2 saturations>88%;Compliance with respiratory medication;Demonstrates proper use of MDI's;Exhibits proper breathing techniques, such as pursed lip breathing or other method taught during  program session;Verbalizes importance of monitoring SPO2 with pulse oximeter and return demonstration    Comments Nancy [name] continues to practice PLB technique, although she reports that she is still SOB even when wearing her O2. She states that she feels the same SOB while wearing her oxygen  even though her O2 saturations have stayed above 90% while on continuous O2. Talked about how PLB works and it's importance in maintaining their exercise saturations.    Goals/Expected  Outcomes Short: Become more profiecient at using PLB.   Long: Become independent at using PLB.             Initial Exercise Prescription:  Initial Exercise Prescription - 02/11/24 1500       Date of Initial Exercise RX and Referring Provider   Date 02/11/24    Referring Provider Dr. Alain Aliment      Oxygen    Oxygen  Continuous    Liters 2    Maintain Oxygen  Saturation 88% or higher      Treadmill   MPH 1.5    Grade 0    Minutes 15    METs 2.15      NuStep   Level 2    SPM 80    Minutes 15    METs 2.7      REL-XR   Level 2    Watts 25    Speed 50    Minutes 15    METs 2.7      Track   Laps 20    Minutes 15    METs 2.09      Intensity   THRR 40-80% of Max Heartrate 122-147    Ratings of Perceived Exertion 11-13    Perceived Dyspnea 0-4      Resistance Training   Training Prescription Yes    Weight 3    Reps 10-15             Perform Capillary Blood Glucose checks as needed.  Exercise Prescription Changes:   Exercise Prescription Changes     Row Name 02/11/24 1500 03/04/24 1600 03/16/24 1400         Response to Exercise   Blood Pressure (Admit) 122/58 108/60 138/66     Blood Pressure (Exercise) 162/64 158/82 --     Blood Pressure (Exit) 142/60 126/74 118/60     Heart Rate (Admit) 98 bpm 95 bpm 106 bpm     Heart Rate (Exercise) 112 bpm 108 bpm 117 bpm     Heart Rate (Exit) 106 bpm 90 bpm 101 bpm     Oxygen  Saturation (Admit) 97 % 95 % 98 %     Oxygen  Saturation (Exercise)  95 % 88 % 94 %     Oxygen  Saturation (Exit) 98 % 94 % 97 %     Rating of Perceived Exertion (Exercise) 12 13 13      Perceived Dyspnea (Exercise) 4 4 3      Symptoms SOB none none     Comments results First 2 weeks of exercise --     Duration -- Progress to 30 minutes of  aerobic without signs/symptoms of physical distress Continue with 30 min of aerobic exercise without signs/symptoms of physical distress.     Intensity -- THRR unchanged THRR unchanged       Progression   Progression -- Continue to progress workloads to maintain intensity without signs/symptoms of physical distress. Continue to progress workloads to maintain intensity without signs/symptoms of physical distress.     Average METs -- 2.7 2.33       Resistance Training   Training Prescription -- Yes Yes     Weight -- 3 3     Reps -- 10-15 10-15       Interval Training   Interval Training -- No No       Oxygen    Oxygen  -- Continuous Continuous     Liters -- 3 2       Treadmill   MPH --  1.5 1.5     Grade -- 0 0     Minutes -- 15 15     METs -- 2.15 2.15       Recumbant Bike   Level -- 1.4 --     Watts -- 25 --     Minutes -- 15 --     METs -- 2.86 --       NuStep   Level -- 3 --     Minutes -- 15 --     METs -- 2.8 --       REL-XR   Level -- 4 4     Minutes -- 15 15     METs -- 3.9 3       Biostep-RELP   Level -- -- 1     Minutes -- -- 15     METs -- -- 2       Oxygen    Maintain Oxygen  Saturation -- 88% or higher 88% or higher              Exercise Comments:   Exercise Comments     Row Name 02/18/24 1553           Exercise Comments First full day of exercise!  Patient was oriented to gym and equipment including functions, settings, policies, and procedures.  Patient's individual exercise prescription and treatment plan were reviewed.  All starting workloads were established based on the results of the 6 minute walk test done at initial orientation visit.  The plan for exercise  progression was also introduced and progression will be customized based on patient's performance and goals.                Exercise Goals and Review:   Exercise Goals     Row Name 02/11/24 1535             Exercise Goals   Increase Physical Activity Yes       Intervention Provide advice, education, support and counseling about physical activity/exercise needs.;Develop an individualized exercise prescription for aerobic and resistive training based on initial evaluation findings, risk stratification, comorbidities and participant's personal goals.       Expected Outcomes Short Term: Attend rehab on a regular basis to increase amount of physical activity.;Long Term: Add in home exercise to make exercise part of routine and to increase amount of physical activity.;Long Term: Exercising regularly at least 3-5 days a week.       Increase Strength and Stamina Yes       Intervention Provide advice, education, support and counseling about physical activity/exercise needs.;Develop an individualized exercise prescription for aerobic and resistive training based on initial evaluation findings, risk stratification, comorbidities and participant's personal goals.       Expected Outcomes Short Term: Increase workloads from initial exercise prescription for resistance, speed, and METs.;Short Term: Perform resistance training exercises routinely during rehab and add in resistance training at home;Long Term: Improve cardiorespiratory fitness, muscular endurance and strength as measured by increased METs and functional capacity ( )       Able to understand and use rate of perceived exertion (RPE) scale Yes       Intervention Provide education and explanation on how to use RPE scale       Expected Outcomes Short Term: Able to use RPE daily in rehab to express subjective intensity level;Long Term:  Able to use RPE to guide intensity level when exercising independently       Able to understand and  use  Dyspnea scale Yes       Intervention Provide education and explanation on how to use Dyspnea scale       Expected Outcomes Short Term: Able to use Dyspnea scale daily in rehab to express subjective sense of shortness of breath during exertion;Long Term: Able to use Dyspnea scale to guide intensity level when exercising independently       Knowledge and understanding of Target Heart Rate Range (THRR) Yes       Intervention Provide education and explanation of THRR including how the numbers were predicted and where they are located for reference       Expected Outcomes Short Term: Able to state/look up THRR;Long Term: Able to use THRR to govern intensity when exercising independently;Short Term: Able to use daily as guideline for intensity in rehab       Able to check pulse independently Yes       Intervention Provide education and demonstration on how to check pulse in carotid and radial arteries.;Review the importance of being able to check your own pulse for safety during independent exercise       Expected Outcomes Short Term: Able to explain why pulse checking is important during independent exercise       Understanding of Exercise Prescription Yes       Intervention Provide education, explanation, and written materials on patient's individual exercise prescription       Expected Outcomes Short Term: Able to explain program exercise prescription;Long Term: Able to explain home exercise prescription to exercise independently                Exercise Goals Re-Evaluation :  Exercise Goals Re-Evaluation     Row Name 02/18/24 1554 03/04/24 1649 03/16/24 1452         Exercise Goal Re-Evaluation   Exercise Goals Review Increase Physical Activity;Able to understand and use rate of perceived exertion (RPE) scale;Knowledge and understanding of Target Heart Rate Range (THRR);Understanding of Exercise Prescription;Increase Strength and Stamina;Able to understand and use Dyspnea scale;Able to check  pulse independently Increase Physical Activity;Increase Strength and Stamina;Understanding of Exercise Prescription Increase Physical Activity;Increase Strength and Stamina;Understanding of Exercise Prescription     Comments Reviewed RPE and dyspnea scale, THR and program prescription with pt today.  Pt voiced understanding and was given a copy of goals to take home. Nancy [name] is off to a good start in the program. She was able to attend her first few sessions during this weeks review period. During these sessions she was able to increase from level 2 to 3 on the T4 nustep, and increase from level 2 to 4 on the XR. We will continue to monitor her progress in the program. Nancy [name] is doing well in the program and only attended 2 sessions in this review. She maintained level 4 on the XR and a speed of 1.5 mph on the treadmiil with no incline. She also added the biostep at level 1 to her exercise prescription. We will continue to monitor her progress in the program.     Expected Outcomes Short: Use RPE daily to regulate intensity.  Long: Follow program prescription in THR. Short: Continue to follow exercise prescription. Long: Continue exercise to improve strength and stamina. Short: Attend rehab more consistently. Long: Continue exercise to improve strength and stamina.              Discharge Exercise Prescription (Final Exercise Prescription Changes):  Exercise Prescription Changes - 03/16/24 1400  Response to Exercise   Blood Pressure (Admit) 138/66    Blood Pressure (Exit) 118/60    Heart Rate (Admit) 106 bpm    Heart Rate (Exercise) 117 bpm    Heart Rate (Exit) 101 bpm    Oxygen  Saturation (Admit) 98 %    Oxygen  Saturation (Exercise) 94 %    Oxygen  Saturation (Exit) 97 %    Rating of Perceived Exertion (Exercise) 13    Perceived Dyspnea (Exercise) 3    Symptoms none    Duration Continue with 30 min of aerobic exercise without signs/symptoms of physical distress.    Intensity THRR unchanged       Progression   Progression Continue to progress workloads to maintain intensity without signs/symptoms of physical distress.    Average METs 2.33      Resistance Training   Training Prescription Yes    Weight 3    Reps 10-15      Interval Training   Interval Training No      Oxygen    Oxygen  Continuous    Liters 2      Treadmill   MPH 1.5    Grade 0    Minutes 15    METs 2.15      REL-XR   Level 4    Minutes 15    METs 3      Biostep-RELP   Level 1    Minutes 15    METs 2      Oxygen    Maintain Oxygen  Saturation 88% or higher             Nutrition:  Target Goals: Understanding of nutrition guidelines, daily intake of sodium 1500mg , cholesterol 200mg , calories 30% from fat and 7% or less from saturated fats, daily to have 5 or more servings of fruits and vegetables.  Education: All About Nutrition: -Group instruction provided by verbal, written material, interactive activities, discussions, models, and posters to present general guidelines for heart healthy nutrition including fat, fiber, MyPlate, the role of sodium in heart healthy nutrition, utilization of the nutrition label, and utilization of this knowledge for meal planning. Follow up email sent as well. Written material given at graduation. Flowsheet Row Pulmonary Rehab from 01/29/2023 in [facility] Cardiac and Pulmonary Rehab  Education need identified 11/18/22       Biometrics:  Pre Biometrics - 02/11/24 1535       Pre Biometrics   Height 5' 7.55" (1.716 m)    Weight 201 lb 11.2 oz (91.5 kg)    Waist Circumference 39.5 inches    Hip Circumference 43 inches    Waist to Hip Ratio 0.92 %    BMI (Calculated) 31.07    Single Leg Stand 7.34 seconds              Nutrition Therapy Plan and Nutrition Goals:   Nutrition Assessments:  MEDIFICTS Score Key: >=70 Need to make dietary changes  40-70 Heart Healthy Diet <= 40 Therapeutic Level Cholesterol Diet  Flowsheet Row Pulmonary Rehab from  02/13/2023 in [facility] Cardiac and Pulmonary Rehab  Picture Your Plate Total Score on Discharge 56      Picture Your Plate Scores: <46 Unhealthy dietary pattern with much room for improvement. 41-50 Dietary pattern unlikely to meet recommendations for good health and room for improvement. 51-60 More healthful dietary pattern, with some room for improvement.  >60 Healthy dietary pattern, although there may be some specific behaviors that could be improved.   Nutrition Goals Re-Evaluation:  Nutrition Goals Re-Evaluation  Row Name 03/10/24 1535             Goals   Nutrition Goal Nancy [name] has deferred to meet with the RD at this time. She said she may be open to meeting with him in the future.                Nutrition Goals Discharge (Final Nutrition Goals Re-Evaluation):  Nutrition Goals Re-Evaluation - 03/10/24 1535       Goals   Nutrition Goal Nancy [name] has deferred to meet with the RD at this time. She said she may be open to meeting with him in the future.             Psychosocial: Target Goals: Acknowledge presence or absence of significant depression and/or stress, maximize coping skills, provide positive support system. Participant is able to verbalize types and ability to use techniques and skills needed for reducing stress and depression.   Education: Stress, Anxiety, and Depression - Group verbal and visual presentation to define topics covered.  Reviews how body is impacted by stress, anxiety, and depression.  Also discusses healthy ways to reduce stress and to treat/manage anxiety and depression.  Written material given at graduation. Flowsheet Row Pulmonary Rehab from 01/29/2023 in [facility] Cardiac and Pulmonary Rehab  Date 01/22/23  Educator KW  Instruction Review Code 1- [employer] Understanding       Education: Sleep Hygiene -Provides group verbal and written instruction about how sleep can affect your health.  Define sleep hygiene, discuss sleep cycles and impact  of sleep habits. Review good sleep hygiene tips.    Initial Review & Psychosocial Screening:  Initial Psych Review & Screening - 12/24/23 1343       Initial Review   Current issues with None Identified      Family Dynamics   Good Support System? Yes    Comments She can look look to her sister for support. She lives alone and feels like she has no mental instability.      Barriers   Psychosocial barriers to participate in program The patient should benefit from training in stress management and relaxation.;There are no identifiable barriers or psychosocial needs.      Screening Interventions   Interventions Encouraged to exercise;Provide feedback about the scores to participant;Program counselor consult;To provide support and resources with identified psychosocial needs    Expected Outcomes Short Term goal: Utilizing psychosocial counselor, staff and physician to assist with identification of specific Stressors or current issues interfering with healing process. Setting desired goal for each stressor or current issue identified.;Long Term Goal: Stressors or current issues are controlled or eliminated.;Short Term goal: Identification and review with participant of any Quality of Life or Depression concerns found by scoring the questionnaire.;Long Term goal: The participant improves quality of Life and PHQ9 Scores as seen by post scores and/or verbalization of changes             Quality of Life Scores:  Scores of 19 and below usually indicate a poorer quality of life in these areas.  A difference of  2-3 points is a clinically meaningful difference.  A difference of 2-3 points in the total score of the Quality of Life Index has been associated with significant improvement in overall quality of life, self-image, physical symptoms, and general health in studies assessing change in quality of life.  PHQ-9: Review Flowsheet  More data exists      02/11/2024 08/20/2023 02/13/2023 01/28/2023  11/18/2022  Depression screen PHQ 2/9  Decreased Interest 0 0 1 0 1  Down, Depressed, Hopeless 0 0 0 0 0  PHQ - 2 Score 0 0 1 0 1  Altered sleeping 0 1 0 0 0  Tired, decreased energy 0 1 3 3 3   Change in appetite 0 0 0 0 0  Feeling bad or failure about yourself  0 0 0 0 0  Trouble concentrating 0 0 0 0 0  Moving slowly or fidgety/restless 0 0 0 0 0  Suicidal thoughts 0 0 0 0 0  PHQ-9 Score 0 2 4 3 4   Difficult doing work/chores - - Somewhat difficult Not difficult at all -   Interpretation of Total Score  Total Score Depression Severity:  1-4 = Minimal depression, 5-9 = Mild depression, 10-14 = Moderate depression, 15-19 = Moderately severe depression, 20-27 = Severe depression   Psychosocial Evaluation and Intervention:  Psychosocial Evaluation - 12/24/23 1344       Psychosocial Evaluation & Interventions   Interventions Encouraged to exercise with the program and follow exercise prescription    Comments She can look look to her sister for support. She lives alone and feels like she has no mental instability.    Expected Outcomes Short: attend pulmonary rehab for education and exercise. Long: develop and maintain positive self care habits.    Continue Psychosocial Services  Follow up required by staff             Psychosocial Re-Evaluation:  Psychosocial Re-Evaluation     Row Name 03/10/24 1536             Psychosocial Re-Evaluation   Current issues with Current Stress Concerns;Current Sleep Concerns       Comments Nancy [name] reports no major stressors at this time. Nancy [name] states that she is frustrated with her breathing as it has restricted her from doing the things she enjoys. She states that se struggles with staying asleep at this time. She has been taking tylenol  PM, but reports it only helps her sleep sometimes.       Expected Outcomes Short: Continue to attend rehab for exercise and mental boost. Long: Maintain positive outlook.       Interventions Encouraged to attend  Pulmonary Rehabilitation for the exercise       Continue Psychosocial Services  No Follow up required         Initial Review   Source of Stress Concerns Chronic Illness       Comments Her shortness of breath is her main stressor                Psychosocial Discharge (Final Psychosocial Re-Evaluation):  Psychosocial Re-Evaluation - 03/10/24 1536       Psychosocial Re-Evaluation   Current issues with Current Stress Concerns;Current Sleep Concerns    Comments Nancy [name] reports no major stressors at this time. Nancy [name] states that she is frustrated with her breathing as it has restricted her from doing the things she enjoys. She states that se struggles with staying asleep at this time. She has been taking tylenol  PM, but reports it only helps her sleep sometimes.    Expected Outcomes Short: Continue to attend rehab for exercise and mental boost. Long: Maintain positive outlook.    Interventions Encouraged to attend Pulmonary Rehabilitation for the exercise    Continue Psychosocial Services  No Follow up required      Initial Review   Source of Stress Concerns Chronic Illness    Comments Her shortness of breath is her main  stressor             Education: Education Goals: Education classes will be provided on a weekly basis, covering required topics. Participant will state understanding/return demonstration of topics presented.  Learning Barriers/Preferences:   General Pulmonary Education Topics:  Infection Prevention: - Provides verbal and written material to individual with discussion of infection control including proper hand washing and proper equipment cleaning during exercise session. Flowsheet Row Pulmonary Rehab from 02/11/2024 in [facility] Cardiac and Pulmonary Rehab  Date 02/11/24  Educator [facility]  Instruction Review Code 1- Verbalizes Understanding       Falls Prevention: - Provides verbal and written material to individual with discussion of falls prevention and  safety. Flowsheet Row Pulmonary Rehab from 02/11/2024 in [facility] Cardiac and Pulmonary Rehab  Date 02/11/24  Educator [facility]  Instruction Review Code 1- Verbalizes Understanding       Chronic Lung Disease Review: - Group verbal instruction with posters, models, PowerPoint presentations and videos,  to review new updates, new respiratory medications, new advancements in procedures and treatments. Providing information on websites and "800" numbers for continued self-education. Includes information about supplement oxygen , available portable oxygen  systems, continuous and intermittent flow rates, oxygen  safety, concentrators, and Medicare reimbursement for oxygen . Explanation of Pulmonary Drugs, including class, frequency, complications, importance of spacers, rinsing mouth after steroid MDI's, and proper cleaning methods for nebulizers. Review of basic lung anatomy and physiology related to function, structure, and complications of lung disease. Review of risk factors. Discussion about methods for diagnosing sleep apnea and types of masks and machines for OSA. Includes a review of the use of types of environmental controls: home humidity, furnaces, filters, dust mite/pet prevention, HEPA vacuums. Discussion about weather changes, air quality and the benefits of nasal washing. Instruction on Warning signs, infection symptoms, calling MD promptly, preventive modes, and value of vaccinations. Review of effective airway clearance, coughing and/or vibration techniques. Emphasizing that all should Create an Action Plan. Written material given at graduation. Flowsheet Row Pulmonary Rehab from 01/29/2023 in [facility] Cardiac and Pulmonary Rehab  Education need identified 11/18/22  Date 01/15/23  Educator [facility]  Instruction Review Code 1- Verbalizes Understanding       AED/CPR: - Group verbal and written instruction with the use of models to demonstrate the basic use of the AED with the basic ABC's of  resuscitation.    Anatomy and Cardiac Procedures: - Group verbal and visual presentation and models provide information about basic cardiac anatomy and function. Reviews the testing methods done to diagnose heart disease and the outcomes of the test results. Describes the treatment choices: Medical Management, Angioplasty, or Coronary Bypass Surgery for treating various heart conditions including Myocardial Infarction, Angina, Valve Disease, and Cardiac Arrhythmias.  Written material given at graduation. Flowsheet Row Pulmonary Rehab from 01/29/2023 in [facility] Cardiac and Pulmonary Rehab  Date 12/18/22  Educator SB  Instruction Review Code 1- Verbalizes Understanding       Medication Safety: - Group verbal and visual instruction to review commonly prescribed medications for heart and lung disease. Reviews the medication, class of the drug, and side effects. Includes the steps to properly store meds and maintain the prescription regimen.  Written material given at graduation.   Other: -Provides group and verbal instruction on various topics (see comments) Flowsheet Row Pulmonary Rehab from 01/29/2023 in [facility] Cardiac and Pulmonary Rehab  Date 12/25/22  Educator SB  Instruction Review Code 1- Verbalizes Understanding  [Jeopardy]       Knowledge Questionnaire Score:  Core Components/Risk Factors/Patient Goals at Admission:  Personal Goals and Risk Factors at Admission - 12/24/23 1342       Core Components/Risk Factors/Patient Goals on Admission    Weight Management Yes;Weight Loss    Intervention Weight Management: Develop a combined nutrition and exercise program designed to reach desired caloric intake, while maintaining appropriate intake of nutrient and fiber, sodium and fats, and appropriate energy expenditure required for the weight goal.;Weight Management: Provide education and appropriate resources to help participant work on and attain dietary goals.;Weight Management/Obesity:  Establish reasonable short term and long term weight goals.;Obesity: Provide education and appropriate resources to help participant work on and attain dietary goals.    Expected Outcomes Short Term: Continue to assess and modify interventions until short term weight is achieved;Long Term: Adherence to nutrition and physical activity/exercise program aimed toward attainment of established weight goal;Weight Loss: Understanding of general recommendations for a balanced deficit meal plan, which promotes 1-2 lb weight loss per week and includes a negative energy balance of [phone] kcal/d;Understanding recommendations for meals to include 15-35% energy as protein, 25-35% energy from fat, 35-60% energy from carbohydrates, less than 200mg  of dietary cholesterol, 20-35 gm of total fiber daily;Understanding of distribution of calorie intake throughout the day with the consumption of 4-5 meals/snacks    Improve shortness of breath with ADL's Yes    Intervention Provide education, individualized exercise plan and daily activity instruction to help decrease symptoms of SOB with activities of daily living.    Expected Outcomes Short Term: Improve cardiorespiratory fitness to achieve a reduction of symptoms when performing ADLs;Long Term: Be able to perform more ADLs without symptoms or delay the onset of symptoms    Hypertension Yes    Intervention Provide education on lifestyle modifcations including regular physical activity/exercise, weight management, moderate sodium restriction and increased consumption of fresh fruit, vegetables, and low fat dairy, alcohol moderation, and smoking cessation.;Monitor prescription use compliance.    Expected Outcomes Short Term: Continued assessment and intervention until BP is < 140/76mm HG in hypertensive participants. < 130/70mm HG in hypertensive participants with diabetes, heart failure or chronic kidney disease.;Long Term: Maintenance of blood pressure at goal levels.              Education:Diabetes - Individual verbal and written instruction to review signs/symptoms of diabetes, desired ranges of glucose level fasting, after meals and with exercise. Acknowledge that pre and post exercise glucose checks will be done for 3 sessions at entry of program.   Know Your Numbers and Heart Failure: - Group verbal and visual instruction to discuss disease risk factors for cardiac and pulmonary disease and treatment options.  Reviews associated critical values for Overweight/Obesity, Hypertension, Cholesterol, and Diabetes.  Discusses basics of heart failure: signs/symptoms and treatments.  Introduces Heart Failure Zone chart for action plan for heart failure.  Written material given at graduation.   Core Components/Risk Factors/Patient Goals Review:   Goals and Risk Factor Review     Row Name 03/10/24 1541             Core Components/Risk Factors/Patient Goals Review   Personal Goals Review Weight Management/Obesity;Improve shortness of breath with ADL's       Review Nancy [name] states that she would still like to lose some weight. She would like to get down to 150 lbs but is restricted by her breathing and is not able to exercise as much as she feels she needs to. She is continuing to practice PLB but states she still feels  SOB. She reports still feeling SOB even when wearing her oxygen , although her O2 saturations have stayed above 90%.       Expected Outcomes Short: Continue to practice breathing techniques to improve SOB. Long: Continue to manage lifestyle risk factors.                Core Components/Risk Factors/Patient Goals at Discharge (Final Review):   Goals and Risk Factor Review - 03/10/24 1541       Core Components/Risk Factors/Patient Goals Review   Personal Goals Review Weight Management/Obesity;Improve shortness of breath with ADL's    Review Nancy [name] states that she would still like to lose some weight. She would like to get down to 150 lbs but is  restricted by her breathing and is not able to exercise as much as she feels she needs to. She is continuing to practice PLB but states she still feels SOB. She reports still feeling SOB even when wearing her oxygen , although her O2 saturations have stayed above 90%.    Expected Outcomes Short: Continue to practice breathing techniques to improve SOB. Long: Continue to manage lifestyle risk factors.             ITP Comments:  ITP Comments     Row Name 12/24/23 1350 02/11/24 1527 02/18/24 1125 02/18/24 1553 03/17/24 1301   ITP Comments Virtual Visit completed. Patient informed on EP and RD appointment and 6 Minute walk test. Patient also informed of patient health questionnaires on My Chart. Patient Verbalizes understanding. Visit diagnosis [facility] found in [facility] 12/08/[facility]. Completed and gym orientation. Initial ITP created and sent for review to Dr. Faud Aleskerov, Medical Director. 30 Day review completed. Medical Director ITP review done, changes made as directed, and signed approval by Medical Director.    new to program First full day of exercise!  Patient was oriented to gym and equipment including functions, settings, policies, and procedures.  Patient's individual exercise prescription and treatment plan were reviewed.  All starting workloads were established based on the results of the 6 minute walk test done at initial orientation visit.  The plan for exercise progression was also introduced and progression will be customized based on patient's performance and goals. 30 Day review completed. Medical Director ITP review done, changes made as directed, and signed approval by Medical Director. New to program.            Comments: 30 day review

## 2024-03-17 NOTE — Progress Notes (Signed)
 30 Day review completed. Medical Director ITP review done, changes made as directed, and signed approval by Medical Director. New to program.

## 2024-03-17 NOTE — Progress Notes (Signed)
 Daily Session Note  Patient Details  Name: Nancy [name] MRN: [id number] Date of Birth: 05/18/1961 Referring Provider:   Flowsheet Row Pulmonary Rehab from 02/11/2024 in [facility] Cardiac and Pulmonary Rehab  Referring Provider Dr. Alain Aliment       Encounter Date: 03/17/2024  Check In:  Session Check In - 03/17/24 1533       Check-In   Supervising physician immediately available to respond to emergencies See telemetry face sheet for immediately available ER MD    Location [facility]-Cardiac & Pulmonary Rehab    Staff Present Sue Em RN,BSN;Joseph Nancey Awkward;Maud Sorenson, RN, BSN, [employer], ACSM CEP, Exercise Physiologist    Virtual Visit No    Medication changes reported     No    Fall or balance concerns reported    No    Warm-up and Cool-down Performed on first and last piece of equipment    Resistance Training Performed Yes    VAD Patient? No    PAD/SET Patient? No      Pain Assessment   Currently in Pain? No/denies                Social History   Tobacco Use  Smoking Status Former   Current packs/day: 0.00   Average packs/day: 0.5 packs/day for 30.0 years (15.0 ttl pk-yrs)   Types: Cigarettes   Start date: 03/11/1986   Quit date: 03/11/2016   Years since quitting: 8.0  Smokeless Tobacco Never  Tobacco Comments   patient states  that she is working at her own pace to quit smoking    Goals Met:  Independence with exercise equipment Exercise tolerated well No report of concerns or symptoms today Strength training completed today  Goals Unmet:  Not Applicable  Comments: Pt able to follow exercise prescription today without complaint.  Will continue to monitor for progression.    Dr. Firman Hughes is Medical Director for [facility] Cardiac Rehabilitation.  Dr. Fuad Aleskerov is Medical Director for [facility] Pulmonary Rehabilitation.

## 2024-03-18 ENCOUNTER — Encounter

## 2024-03-22 ENCOUNTER — Telehealth: Payer: Self-pay | Admitting: Family Medicine

## 2024-03-22 DIAGNOSIS — E038 Other specified hypothyroidism: Secondary | ICD-10-CM

## 2024-03-22 MED ORDER — LEVOTHYROXINE SODIUM 125 MCG PO TABS: 125.0000 ug | ORAL_TABLET | Freq: Every day | ORAL | 3 refills | Status: AC

## 2024-03-22 NOTE — Telephone Encounter (Signed)
 [facility] is requesting refill levothyroxine  (SYNTHROID ) 125 MCG tablet  Please advise

## 2024-03-24 ENCOUNTER — Encounter

## 2024-03-25 ENCOUNTER — Encounter: Admitting: *Deleted

## 2024-03-25 DIAGNOSIS — J449 Chronic obstructive pulmonary disease, unspecified: Secondary | ICD-10-CM

## 2024-03-25 NOTE — Progress Notes (Signed)
 Daily Session Note  Patient Details  Name: MAYLENA ZAHLER MRN: [id number] Date of Birth: 05-Feb-1961 Referring Provider:   Flowsheet Row Pulmonary Rehab from 02/11/2024 in [facility] Cardiac and Pulmonary Rehab  Referring Provider Dr. Alain Aliment       Encounter Date: 03/25/2024  Check In:  Session Check In - 03/25/24 1541       Check-In   Supervising physician immediately available to respond to emergencies See telemetry face sheet for immediately available ER MD    Location [facility]-Cardiac & Pulmonary Rehab    Staff Present Sue Em RN,BSN;[facility] BS, Exercise Physiologist    Virtual Visit No    Medication changes reported     No    Fall or balance concerns reported    No    Warm-up and Cool-down Performed on first and last piece of equipment    Resistance Training Performed Yes    VAD Patient? No    PAD/SET Patient? No      Pain Assessment   Currently in Pain? No/denies                Social History   Tobacco Use  Smoking Status Former   Current packs/day: 0.00   Average packs/day: 0.5 packs/day for 30.0 years (15.0 ttl pk-yrs)   Types: Cigarettes   Start date: 03/11/1986   Quit date: 03/11/2016   Years since quitting: 8.0  Smokeless Tobacco Never  Tobacco Comments   patient states  that she is working at her own pace to quit smoking    Goals Met:  Independence with exercise equipment Exercise tolerated well No report of concerns or symptoms today Strength training completed today  Goals Unmet:  Not Applicable  Comments: Pt able to follow exercise prescription today without complaint.  Will continue to monitor for progression.    Dr. Firman Hughes is Medical Director for [facility] Cardiac Rehabilitation.  Dr. Fuad Aleskerov is Medical Director for [facility] Pulmonary Rehabilitation.

## 2024-03-31 ENCOUNTER — Encounter: Admitting: *Deleted

## 2024-03-31 DIAGNOSIS — J449 Chronic obstructive pulmonary disease, unspecified: Secondary | ICD-10-CM | POA: Diagnosis not present

## 2024-03-31 NOTE — Progress Notes (Signed)
 Daily Session Note  Patient Details  Name: Nancy [name] MRN: [id number] Date of Birth: 05-31-1961 Referring Provider:   Flowsheet Row Pulmonary Rehab from 02/11/2024 in [facility] Cardiac and Pulmonary Rehab  Referring Provider Dr. Alain Aliment       Encounter Date: 03/31/2024  Check In:  Session Check In - 03/31/24 1541       Check-In   Supervising physician immediately available to respond to emergencies See telemetry face sheet for immediately available ER MD    Location [facility]-Cardiac & Pulmonary Rehab    Staff Present Sue Em RN,BSN;Kelly Sabra Cramp BS, ACSM CEP, Exercise Physiologist;Susanne Bice, RN, BSN, CCRP    Virtual Visit No    Medication changes reported     No    Fall or balance concerns reported    No    Warm-up and Cool-down Performed on first and last piece of equipment    Resistance Training Performed Yes    VAD Patient? No    PAD/SET Patient? No      Pain Assessment   Currently in Pain? No/denies                Social History   Tobacco Use  Smoking Status Former   Current packs/day: 0.00   Average packs/day: 0.5 packs/day for 30.0 years (15.0 ttl pk-yrs)   Types: Cigarettes   Start date: 03/11/1986   Quit date: 03/11/2016   Years since quitting: 8.0  Smokeless Tobacco Never  Tobacco Comments   patient states  that she is working at her own pace to quit smoking    Goals Met:  Independence with exercise equipment Exercise tolerated well No report of concerns or symptoms today Strength training completed today  Goals Unmet:  Not Applicable  Comments: Pt able to follow exercise prescription today without complaint.  Will continue to monitor for progression.    Dr. Firman Hughes is Medical Director for [facility] Cardiac Rehabilitation.  Dr. Fuad Aleskerov is Medical Director for [facility] Pulmonary Rehabilitation.

## 2024-04-01 ENCOUNTER — Encounter: Admitting: *Deleted

## 2024-04-01 DIAGNOSIS — J449 Chronic obstructive pulmonary disease, unspecified: Secondary | ICD-10-CM | POA: Diagnosis not present

## 2024-04-01 NOTE — Progress Notes (Signed)
 Daily Session Note  Patient Details  Name: Nancy [name] MRN: [id number] Date of Birth: 1961-07-20 Referring Provider:   Flowsheet Row Pulmonary Rehab from 02/11/2024 in [facility] Cardiac and Pulmonary Rehab  Referring Provider Dr. Alain Aliment       Encounter Date: 04/01/2024  Check In:  Session Check In - 04/01/24 1527       Check-In   Supervising physician immediately available to respond to emergencies See telemetry face sheet for immediately available ER MD    Location [facility]-Cardiac & Pulmonary Rehab    Staff Present Sue Em RN,BSN;[facility] BS, Exercise Physiologist    Virtual Visit No    Medication changes reported     No    Fall or balance concerns reported    No    Warm-up and Cool-down Performed on first and last piece of equipment    Resistance Training Performed Yes    VAD Patient? No    PAD/SET Patient? No      Pain Assessment   Currently in Pain? No/denies                Social History   Tobacco Use  Smoking Status Former   Current packs/day: 0.00   Average packs/day: 0.5 packs/day for 30.0 years (15.0 ttl pk-yrs)   Types: Cigarettes   Start date: 03/11/1986   Quit date: 03/11/2016   Years since quitting: 8.0  Smokeless Tobacco Never  Tobacco Comments   patient states  that she is working at her own pace to quit smoking    Goals Met:  Independence with exercise equipment Exercise tolerated well No report of concerns or symptoms today Strength training completed today  Goals Unmet:  Not Applicable  Comments: Pt able to follow exercise prescription today without complaint.  Will continue to monitor for progression.    Dr. Firman Hughes is Medical Director for [facility] Cardiac Rehabilitation.  Dr. Fuad Aleskerov is Medical Director for [facility] Pulmonary Rehabilitation.

## 2024-04-07 ENCOUNTER — Encounter: Admitting: *Deleted

## 2024-04-07 DIAGNOSIS — J449 Chronic obstructive pulmonary disease, unspecified: Secondary | ICD-10-CM | POA: Diagnosis not present

## 2024-04-07 NOTE — Progress Notes (Signed)
 Daily Session Note  Patient Details  Name: Nancy [name] MRN: [id number] Date of Birth: 1960-12-26 Referring Provider:   Flowsheet Row Pulmonary Rehab from 02/11/2024 in [facility] Cardiac and Pulmonary Rehab  Referring Provider Dr. Alain Aliment       Encounter Date: 04/07/2024  Check In:  Session Check In - 04/07/24 1543       Check-In   Supervising physician immediately available to respond to emergencies See telemetry face sheet for immediately available ER MD    Location [facility]-Cardiac & Pulmonary Rehab    Staff Present Sue Em RN,BSN;Susanne Bice, RN, BSN, CCRP;Joseph Hood RCP,RRT,BSRT;Kelly [place] BS, ACSM CEP, Exercise Physiologist    Virtual Visit No    Medication changes reported     No    Fall or balance concerns reported    No    Warm-up and Cool-down Performed on first and last piece of equipment    Resistance Training Performed Yes    VAD Patient? No    PAD/SET Patient? No      Pain Assessment   Currently in Pain? No/denies                Social History   Tobacco Use  Smoking Status Former   Current packs/day: 0.00   Average packs/day: 0.5 packs/day for 30.0 years (15.0 ttl pk-yrs)   Types: Cigarettes   Start date: 03/11/1986   Quit date: 03/11/2016   Years since quitting: 8.0  Smokeless Tobacco Never  Tobacco Comments   patient states  that she is working at her own pace to quit smoking    Goals Met:  Independence with exercise equipment Exercise tolerated well No report of concerns or symptoms today Strength training completed today  Goals Unmet:  Not Applicable  Comments: Pt able to follow exercise prescription today without complaint.  Will continue to monitor for progression.   Reviewed home exercise with pt today.  Pt plans to use hand weights and walk for exercise.  Reviewed THR, pulse, RPE, sign and symptoms, pulse oximetery and when to call 911 or MD.  Also discussed weather considerations and indoor options.  Pt voiced  understanding.   Dr. Firman Hughes is Medical Director for [facility] Cardiac Rehabilitation.  Dr. Fuad Aleskerov is Medical Director for [facility] Pulmonary Rehabilitation.

## 2024-04-08 ENCOUNTER — Encounter: Admitting: *Deleted

## 2024-04-08 DIAGNOSIS — J449 Chronic obstructive pulmonary disease, unspecified: Secondary | ICD-10-CM

## 2024-04-08 NOTE — Progress Notes (Signed)
 Daily Session Note  Patient Details  Name: Nancy [name] MRN: [id number] Date of Birth: 1961/04/05 Referring Provider:   Flowsheet Row Pulmonary Rehab from 02/11/2024 in [facility] Cardiac and Pulmonary Rehab  Referring Provider Dr. Alain Aliment       Encounter Date: 04/08/2024  Check In:  Session Check In - 04/08/24 1538       Check-In   Supervising physician immediately available to respond to emergencies See telemetry face sheet for immediately available ER MD    Location [facility]-Cardiac & Pulmonary Rehab    Staff Present Sue Em RN,BSN;[facility] BS, Exercise Physiologist    Virtual Visit No    Medication changes reported     No    Fall or balance concerns reported    No    Warm-up and Cool-down Performed on first and last piece of equipment    Resistance Training Performed Yes    VAD Patient? No    PAD/SET Patient? No      Pain Assessment   Currently in Pain? No/denies                Social History   Tobacco Use  Smoking Status Former   Current packs/day: 0.00   Average packs/day: 0.5 packs/day for 30.0 years (15.0 ttl pk-yrs)   Types: Cigarettes   Start date: 03/11/1986   Quit date: 03/11/2016   Years since quitting: 8.0  Smokeless Tobacco Never  Tobacco Comments   patient states  that she is working at her own pace to quit smoking    Goals Met:  Independence with exercise equipment Exercise tolerated well No report of concerns or symptoms today Strength training completed today  Goals Unmet:  Not Applicable  Comments: Pt able to follow exercise prescription today without complaint.  Will continue to monitor for progression.    Dr. Firman Hughes is Medical Director for [facility] Cardiac Rehabilitation.  Dr. Fuad Aleskerov is Medical Director for [facility] Pulmonary Rehabilitation.

## 2024-04-14 ENCOUNTER — Encounter

## 2024-04-14 ENCOUNTER — Encounter: Payer: Self-pay | Admitting: *Deleted

## 2024-04-14 DIAGNOSIS — J449 Chronic obstructive pulmonary disease, unspecified: Secondary | ICD-10-CM

## 2024-04-14 NOTE — Progress Notes (Signed)
 Pulmonary Individual Treatment Plan  Patient Details  Name: Nancy [name] MRN: [id number] Date of Birth: 1961/10/15 Referring Provider:   Flowsheet Row Pulmonary Rehab from 02/11/2024 in [facility] Cardiac and Pulmonary Rehab  Referring Provider Dr. Alain Aliment       Initial Encounter Date:  Flowsheet Row Pulmonary Rehab from 02/11/2024 in [facility] Cardiac and Pulmonary Rehab  Date 02/11/24       Visit Diagnosis: Stage 3 severe COPD by GOLD classification (HCC)  Patient's Home Medications on Admission:  Current Outpatient Medications:    acetaminophen  (TYLENOL ) 500 MG tablet, Take 500 mg by mouth every 4 (four) hours as needed., Disp: , Rfl:    albuterol  (VENTOLIN  HFA) 108 (90 Base) MCG/ACT inhaler, SMARTSIG:2 Inhalation Via Inhaler Every 6 Hours PRN, Disp: , Rfl:    amLODipine  (NORVASC ) 5 MG tablet, Take 5 mg by mouth daily., Disp: , Rfl:    benzonatate  (TESSALON ) 200 MG capsule, Take 1 capsule (200 mg total) by mouth 2 (two) times daily as needed for cough., Disp: 20 capsule, Rfl: 0   diclofenac  Sodium (VOLTAREN ) 1 % GEL, Apply 2 g topically 4 (four) times daily., Disp: 100 g, Rfl: 0   doxycycline  (PERIOSTAT ) 20 MG tablet, Take one tab po BID with food., Disp: 60 tablet, Rfl: 11   DULoxetine  (CYMBALTA ) 20 MG capsule, Take 2 capsules (40 mg total) by mouth 2 (two) times daily., Disp: 360 capsule, Rfl: 1   enoxaparin  (LOVENOX ) 100 MG/ML injection, Inject into the skin. (Patient not taking: Reported on 12/24/2023), Disp: , Rfl:    enoxaparin  (LOVENOX ) 40 MG/0.4ML injection, Inject 90 mg into the skin daily. (Patient not taking: Reported on 12/24/2023), Disp: , Rfl:    Fluticasone -Umeclidin-Vilant (TRELEGY ELLIPTA ) 100-62.5-25 MCG/ACT AEPB, Inhale into the lungs., Disp: , Rfl:    furosemide  (LASIX ) 40 MG tablet, Take 40 mg by mouth 2 (two) times daily. (Patient not taking: Reported on 12/24/2023), Disp: , Rfl:    furosemide  (LASIX ) 40 MG tablet, Take 1 tablet by mouth 2 (two) times daily., Disp: ,  Rfl:    ipratropium-albuterol  (DUONEB) 0.5-2.5 (3) MG/3ML SOLN, Inhale into the lungs., Disp: , Rfl:    levofloxacin  (LEVAQUIN ) 750 MG tablet, Take 1 tablet (750 mg total) by mouth daily., Disp: 7 tablet, Rfl: 0   levothyroxine  (SYNTHROID ) 125 MCG tablet, Take 1 tablet (125 mcg total) by mouth daily before breakfast., Disp: 90 tablet, Rfl: 3   lidocaine  (LIDODERM ) 5 %, Place 1 patch onto the skin every 12 (twelve) hours. Remove & Discard patch within 12 hours or as directed by MD, Disp: 15 patch, Rfl: 0   metoprolol  succinate (TOPROL -XL) 25 MG 24 hr tablet, Take 25 mg by mouth daily., Disp: , Rfl:    Na Sulfate-K Sulfate-Mg Sulfate concentrate 17.5-3.13-1.6 GM/177ML SOLN, , Disp: , Rfl:    ondansetron  (ZOFRAN  ODT) 4 MG disintegrating tablet, Take 1 tablet (4 mg total) by mouth every 8 (eight) hours as needed for nausea or vomiting., Disp: 20 tablet, Rfl: 2   oxyCODONE  (OXY IR/ROXICODONE ) 5 MG immediate release tablet, Take 1 tablet (5 mg total) by mouth every 6 (six) hours as needed for moderate pain., Disp: 30 tablet, Rfl: 0   polyethylene glycol (MIRALAX  / GLYCOLAX ) 17 g packet, Take 17 g by mouth daily as needed., Disp: , Rfl:    senna-docusate (SENOKOT-S) 8.6-50 MG tablet, Take 1 tablet by mouth 2 (two) times daily as needed for mild constipation., Disp: 20 tablet, Rfl: 0   TRELEGY ELLIPTA  100-62.5-25 MCG/ACT AEPB,  Inhale into the lungs., Disp: , Rfl:    valACYclovir  (VALTREX ) 1000 MG tablet, Take 1 tablet (1,000 mg total) by mouth daily., Disp: 6 tablet, Rfl: 0   warfarin (COUMADIN ) 5 MG tablet, Take 5 mg by mouth every evening. Per cardiologist: patient supposed to take 7.5 mg 9/24 and 9/25 evening for low INR then resume 5 mg nightly  Goal INR 2.5-3.5, Disp: , Rfl:   Current Facility-Administered Medications:    ipratropium-albuterol  (DUONEB) 0.5-2.5 (3) MG/3ML nebulizer solution 3 mL, 3 mL, Nebulization, Once, Pollak, Adriana M, PA-C  Past Medical History: Past Medical History:  Diagnosis  Date   Aortic dissection (HCC) 2010   COPD (chronic obstructive pulmonary disease) (HCC)    Depression    Hypertension    Hypothyroidism    Pneumonia    PONV (postoperative nausea and vomiting)    Rosacea    Seasonal allergies    Sleep apnea    Does not use CPAP    Tobacco Use: Social History   Tobacco Use  Smoking Status Former   Current packs/day: 0.00   Average packs/day: 0.5 packs/day for 30.0 years (15.0 ttl pk-yrs)   Types: Cigarettes   Start date: 03/11/1986   Quit date: 03/11/2016   Years since quitting: 8.0  Smokeless Tobacco Never  Tobacco Comments   patient states  that she is working at her own pace to quit smoking    Labs: Review Flowsheet  More data may exist      Latest Ref Rng & Units 07/02/2009 08/29/2015 01/05/2018 09/29/2020 08/20/2023  Labs for ITP Cardiac and Pulmonary Rehab  Cholestrol 100 - 199 mg/dL - 629  528  413  244   LDL (calc) 0 - 99 mg/dL - 010  272  536  644   HDL-C >39 mg/dL - 53  50  45  38   Trlycerides 0 - 149 mg/dL - 034  742  595  638   Hemoglobin A1c 4.8 - 5.6 % - - - - 4.8   TCO2 0 - 100 mmol/L 22  - - - -     Pulmonary Assessment Scores:  Pulmonary Assessment Scores     Row Name 02/11/24 1537         mMRC Score   mMRC Score 3              UCSD: Self-administered rating of dyspnea associated with activities of daily living (ADLs) 6-point scale (0 = "not at all" to 5 = "maximal or unable to do because of breathlessness")  Scoring Scores range from 0 to 120.  Minimally important difference is 5 units  CAT: CAT can identify the health impairment of COPD patients and is better correlated with disease progression.  CAT has a scoring range of zero to 40. The CAT score is classified into four groups of low (less than 10), medium (10 - 20), high (21-30) and very high (31-40) based on the impact level of disease on health status. A CAT score over 10 suggests significant symptoms.  A worsening CAT score could be explained by an  exacerbation, poor medication adherence, poor inhaler technique, or progression of COPD or comorbid conditions.  CAT MCID is 2 points  mMRC: mMRC (Modified Medical Research Council) Dyspnea Scale is used to assess the degree of baseline functional disability in patients of respiratory disease due to dyspnea. No minimal important difference is established. A decrease in score of 1 point or greater is considered a positive change.   Pulmonary  Function Assessment:  Pulmonary Function Assessment - 12/24/23 1342       Breath   Shortness of Breath Yes;Limiting activity             Exercise Target Goals: Exercise Program Goal: Individual exercise prescription set using results from initial 6 min walk test and THRR while considering  patient's activity barriers and safety.   Exercise Prescription Goal: Initial exercise prescription builds to 30-45 minutes a day of aerobic activity, 2-3 days per week.  Home exercise guidelines will be given to patient during program as part of exercise prescription that the participant will acknowledge.  Education: Aerobic Exercise: - Group verbal and visual presentation on the components of exercise prescription. Introduces F.I.T.T principle from ACSM for exercise prescriptions.  Reviews F.I.T.T. principles of aerobic exercise including progression. Written material given at graduation. Flowsheet Row Pulmonary Rehab from 01/29/2023 in [facility] Cardiac and Pulmonary Rehab  Date 11/27/22  Educator KW  Instruction Review Code 1- [employer] Understanding       Education: Resistance Exercise: - Group verbal and visual presentation on the components of exercise prescription. Introduces F.I.T.T principle from ACSM for exercise prescriptions  Reviews F.I.T.T. principles of resistance exercise including progression. Written material given at graduation. Flowsheet Row Pulmonary Rehab from 01/29/2023 in [facility] Cardiac and Pulmonary Rehab  Date 12/04/22  Educator KW   Instruction Review Code 1- [employer] Understanding        Education: Exercise & Equipment Safety: - Individual verbal instruction and demonstration of equipment use and safety with use of the equipment. Flowsheet Row Pulmonary Rehab from 02/11/2024 in [facility] Cardiac and Pulmonary Rehab  Date 02/11/24  Educator [facility]  Instruction Review Code 1- Verbalizes Understanding       Education: Exercise Physiology & General Exercise Guidelines: - Group verbal and written instruction with models to review the exercise physiology of the cardiovascular system and associated critical values. Provides general exercise guidelines with specific guidelines to those with heart or lung disease.    Education: Flexibility, Balance, Mind/Body Relaxation: - Group verbal and visual presentation with interactive activity on the components of exercise prescription. Introduces F.I.T.T principle from ACSM for exercise prescriptions. Reviews F.I.T.T. principles of flexibility and balance exercise training including progression. Also discusses the mind body connection.  Reviews various relaxation techniques to help reduce and manage stress (i.e. Deep breathing, progressive muscle relaxation, and visualization). Balance handout provided to take home. Written material given at graduation. Flowsheet Row Pulmonary Rehab from 01/29/2023 in [facility] Cardiac and Pulmonary Rehab  Date 12/11/22  Educator KW  Instruction Review Code 1- Verbalizes Understanding       Activity Barriers & Risk Stratification:  Activity Barriers & Cardiac Risk Stratification - 02/11/24 1530       Activity Barriers & Cardiac Risk Stratification   Activity Barriers None             6 Minute Walk:  6 Minute Walk     Row Name 02/11/24 1528         6 Minute Walk   Phase Initial     Distance 755 feet     Walk Time 5.17 minutes     # of Rest Breaks 2     MPH 1.66     METS 2.7     RPE 12     Perceived Dyspnea  4     VO2 Peak 9.56      Symptoms Yes (comment)     Comments SOB     Resting HR 98 bpm  Resting BP 122/58     Resting Oxygen  Saturation  97 %     Exercise Oxygen  Saturation  during 6 min walk 95 %     Max Ex. HR 112 bpm     Max Ex. BP 162/64       Interval HR   1 Minute HR 102     2 Minute HR 103     3 Minute HR 107     4 Minute HR 110     5 Minute HR 106     6 Minute HR 112     2 Minute Post HR 106     Interval Heart Rate? Yes       Interval Oxygen    Interval Oxygen ? Yes     Baseline Oxygen  Saturation % 97 %     1 Minute Oxygen  Saturation % 98 %     1 Minute Liters of Oxygen  2 L     2 Minute Oxygen  Saturation % 97 %     2 Minute Liters of Oxygen  2 L     3 Minute Oxygen  Saturation % 96 %     3 Minute Liters of Oxygen  2 L     4 Minute Oxygen  Saturation % 96 %     4 Minute Liters of Oxygen  2 L     5 Minute Oxygen  Saturation % 97 %     5 Minute Liters of Oxygen  2 L     6 Minute Oxygen  Saturation % 95 %     6 Minute Liters of Oxygen  2 L     2 Minute Post Oxygen  Saturation % 98 %     2 Minute Post Liters of Oxygen  2 L             Oxygen  Initial Assessment:  Oxygen  Initial Assessment - 02/11/24 1537       Home Oxygen    Home Oxygen  Device Portable Concentrator;Home Concentrator    Sleep Oxygen  Prescription Continuous    Liters per minute 2    Home Exercise Oxygen  Prescription Continuous    Liters per minute 2    Home Resting Oxygen  Prescription None    Compliance with Home Oxygen  Use Yes      Initial 6 min Walk   Oxygen  Used Continuous    Liters per minute 2      Program Oxygen  Prescription   Program Oxygen  Prescription Continuous    Liters per minute 2      Intervention   Short Term Goals To learn and exhibit compliance with exercise, home and travel O2 prescription;To learn and understand importance of monitoring SPO2 with pulse oximeter and demonstrate accurate use of the pulse oximeter.;To learn and demonstrate proper pursed lip breathing techniques or other breathing  techniques. ;To learn and demonstrate proper use of respiratory medications;To learn and understand importance of maintaining oxygen  saturations>88%    Long  Term Goals Exhibits compliance with exercise, home  and travel O2 prescription;Maintenance of O2 saturations>88%;Compliance with respiratory medication;Demonstrates proper use of MDI's;Exhibits proper breathing techniques, such as pursed lip breathing or other method taught during program session;Verbalizes importance of monitoring SPO2 with pulse oximeter and return demonstration             Oxygen  Re-Evaluation:  Oxygen  Re-Evaluation     Row Name 02/18/24 1555 03/10/24 1546 04/01/24 1558         Program Oxygen  Prescription   Program Oxygen  Prescription -- Continuous Continuous     Liters per minute -- 2  2       Home Oxygen    Home Oxygen  Device -- Portable Concentrator;Home Concentrator Portable Concentrator;Home Concentrator     Sleep Oxygen  Prescription -- Continuous Continuous;CPAP  Nancy [name] states she cannot tolerate it.     Liters per minute -- 2 2     Home Exercise Oxygen  Prescription -- Continuous Continuous     Liters per minute -- 2 2     Home Resting Oxygen  Prescription -- None None     Compliance with Home Oxygen  Use -- -- No       Goals/Expected Outcomes   Short Term Goals -- To learn and exhibit compliance with exercise, home and travel O2 prescription;To learn and understand importance of monitoring SPO2 with pulse oximeter and demonstrate accurate use of the pulse oximeter.;To learn and demonstrate proper pursed lip breathing techniques or other breathing techniques. ;To learn and demonstrate proper use of respiratory medications;To learn and understand importance of maintaining oxygen  saturations>88% Other;To learn and understand importance of maintaining oxygen  saturations>88%     Long  Term Goals -- Exhibits compliance with exercise, home  and travel O2 prescription;Maintenance of O2 saturations>88%;Compliance with  respiratory medication;Demonstrates proper use of MDI's;Exhibits proper breathing techniques, such as pursed lip breathing or other method taught during program session;Verbalizes importance of monitoring SPO2 with pulse oximeter and return demonstration Other;Maintenance of O2 saturations>88%     Comments Reviewed PLB technique with pt.  Talked about how it works and it's importance in maintaining their exercise saturations. Nancy [name] continues to practice PLB technique, although she reports that she is still SOB even when wearing her O2. She states that she feels the same SOB while wearing her oxygen  even though her O2 saturations have stayed above 90% while on continuous O2. Talked about how PLB works and it's importance in maintaining their exercise saturations. Nancy [name] states that her concentrator does not do anything for her shortness of breath so she does not use it when she is out and about. Informed her the differences in shortness of breath and oxygenation. Patient verbalizes understanding. She states she does not tolerate wearing any of her CPAP mask.     Goals/Expected Outcomes Short: Become more profiecient at using PLB.   Long: Become independent at using PLB. Short: Become more profiecient at using PLB.   Long: Become independent at using PLB. Short: maintain spo2 greater than 88 percent on exertion. Long: maintain oxygen  saturation independently.              Oxygen  Discharge (Final Oxygen  Re-Evaluation):  Oxygen  Re-Evaluation - 04/01/24 1558       Program Oxygen  Prescription   Program Oxygen  Prescription Continuous    Liters per minute 2      Home Oxygen    Home Oxygen  Device Portable Concentrator;Home Concentrator    Sleep Oxygen  Prescription Continuous;CPAP   Amaris states she cannot tolerate it.   Liters per minute 2    Home Exercise Oxygen  Prescription Continuous    Liters per minute 2    Home Resting Oxygen  Prescription None    Compliance with Home Oxygen  Use No       Goals/Expected Outcomes   Short Term Goals Other;To learn and understand importance of maintaining oxygen  saturations>88%    Long  Term Goals Other;Maintenance of O2 saturations>88%    Comments Nancy [name] states that her concentrator does not do anything for her shortness of breath so she does not use it when she is out and about. Informed her the differences in shortness of breath  and oxygenation. Patient verbalizes understanding. She states she does not tolerate wearing any of her CPAP mask.    Goals/Expected Outcomes Short: maintain spo2 greater than 88 percent on exertion. Long: maintain oxygen  saturation independently.             Initial Exercise Prescription:  Initial Exercise Prescription - 02/11/24 1500       Date of Initial Exercise RX and Referring Provider   Date 02/11/24    Referring Provider Dr. Alain Aliment      Oxygen    Oxygen  Continuous    Liters 2    Maintain Oxygen  Saturation 88% or higher      Treadmill   MPH 1.5    Grade 0    Minutes 15    METs 2.15      NuStep   Level 2    SPM 80    Minutes 15    METs 2.7      REL-XR   Level 2    Watts 25    Speed 50    Minutes 15    METs 2.7      Track   Laps 20    Minutes 15    METs 2.09      Intensity   THRR 40-80% of Max Heartrate 122-147    Ratings of Perceived Exertion 11-13    Perceived Dyspnea 0-4      Resistance Training   Training Prescription Yes    Weight 3    Reps 10-15             Perform Capillary Blood Glucose checks as needed.  Exercise Prescription Changes:   Exercise Prescription Changes     Row Name 02/11/24 1500 03/04/24 1600 03/16/24 1400 04/01/24 1700 04/07/24 1500     Response to Exercise   Blood Pressure (Admit) 122/58 108/60 138/66 120/70 --   Blood Pressure (Exercise) 162/64 158/82 -- 146/72 --   Blood Pressure (Exit) 142/60 126/74 118/60 118/88 --   Heart Rate (Admit) 98 bpm 95 bpm 106 bpm 109 bpm --   Heart Rate (Exercise) 112 bpm 108 bpm 117 bpm 120 bpm --    Heart Rate (Exit) 106 bpm 90 bpm 101 bpm 108 bpm --   Oxygen  Saturation (Admit) 97 % 95 % 98 % 97 % --   Oxygen  Saturation (Exercise) 95 % 88 % 94 % 95 % --   Oxygen  Saturation (Exit) 98 % 94 % 97 % 96 % --   Rating of Perceived Exertion (Exercise) 12 13 13 13  --   Perceived Dyspnea (Exercise) 4 4 3 3  --   Symptoms SOB none none none --   Comments results First 2 weeks of exercise -- -- --   Duration -- Progress to 30 minutes of  aerobic without signs/symptoms of physical distress Continue with 30 min of aerobic exercise without signs/symptoms of physical distress. Continue with 30 min of aerobic exercise without signs/symptoms of physical distress. Continue with 30 min of aerobic exercise without signs/symptoms of physical distress.   Intensity -- THRR unchanged THRR unchanged THRR unchanged THRR unchanged     Progression   Progression -- Continue to progress workloads to maintain intensity without signs/symptoms of physical distress. Continue to progress workloads to maintain intensity without signs/symptoms of physical distress. Continue to progress workloads to maintain intensity without signs/symptoms of physical distress. Continue to progress workloads to maintain intensity without signs/symptoms of physical distress.   Average METs -- 2.7 2.33 2.02 2.02  Resistance Training   Training Prescription -- Yes Yes Yes Yes   Weight -- 3 3 3 3    Reps -- 10-15 10-15 10-15 10-15     Interval Training   Interval Training -- No No No No     Oxygen    Oxygen  -- Continuous Continuous Continuous Continuous   Liters -- 3 2 2 2      Treadmill   MPH -- 1.5 1.5 1.3 1.3   Grade -- 0 0 0 0   Minutes -- 15 15 15 15    METs -- 2.15 2.15 1.99 1.99     Recumbant Bike   Level -- 1.4 -- -- --   Watts -- 25 -- -- --   Minutes -- 15 -- -- --   METs -- 2.86 -- -- --     NuStep   Level -- 3 -- 4 4   Minutes -- 15 -- 15 15   METs -- 2.8 -- 2.1 2.1     REL-XR   Level -- 4 4 -- --   Minutes  -- 15 15 -- --   METs -- 3.9 3 -- --     T5 Nustep   Level -- -- -- 3  T6 3  T6   SPM -- -- -- 80 80   Minutes -- -- -- 15 15   METs -- -- -- 2 2     Biostep-RELP   Level -- -- 1 -- --   Minutes -- -- 15 -- --   METs -- -- 2 -- --     Home Exercise Plan   Plans to continue exercise at -- -- -- -- Home (comment)  weights and walking   Frequency -- -- -- -- Add 2 additional days to program exercise sessions.   Initial Home Exercises Provided -- -- -- -- 04/07/24     Oxygen    Maintain Oxygen  Saturation -- 88% or higher 88% or higher 88% or higher 88% or higher            Exercise Comments:   Exercise Comments     Row Name 02/18/24 1553           Exercise Comments First full day of exercise!  Patient was oriented to gym and equipment including functions, settings, policies, and procedures.  Patient's individual exercise prescription and treatment plan were reviewed.  All starting workloads were established based on the results of the 6 minute walk test done at initial orientation visit.  The plan for exercise progression was also introduced and progression will be customized based on patient's performance and goals.                Exercise Goals and Review:   Exercise Goals     Row Name 02/11/24 1535             Exercise Goals   Increase Physical Activity Yes       Intervention Provide advice, education, support and counseling about physical activity/exercise needs.;Develop an individualized exercise prescription for aerobic and resistive training based on initial evaluation findings, risk stratification, comorbidities and participant's personal goals.       Expected Outcomes Short Term: Attend rehab on a regular basis to increase amount of physical activity.;Long Term: Add in home exercise to make exercise part of routine and to increase amount of physical activity.;Long Term: Exercising regularly at least 3-5 days a week.       Increase Strength and Stamina  Yes  Intervention Provide advice, education, support and counseling about physical activity/exercise needs.;Develop an individualized exercise prescription for aerobic and resistive training based on initial evaluation findings, risk stratification, comorbidities and participant's personal goals.       Expected Outcomes Short Term: Increase workloads from initial exercise prescription for resistance, speed, and METs.;Short Term: Perform resistance training exercises routinely during rehab and add in resistance training at home;Long Term: Improve cardiorespiratory fitness, muscular endurance and strength as measured by increased METs and functional capacity ( )       Able to understand and use rate of perceived exertion (RPE) scale Yes       Intervention Provide education and explanation on how to use RPE scale       Expected Outcomes Short Term: Able to use RPE daily in rehab to express subjective intensity level;Long Term:  Able to use RPE to guide intensity level when exercising independently       Able to understand and use Dyspnea scale Yes       Intervention Provide education and explanation on how to use Dyspnea scale       Expected Outcomes Short Term: Able to use Dyspnea scale daily in rehab to express subjective sense of shortness of breath during exertion;Long Term: Able to use Dyspnea scale to guide intensity level when exercising independently       Knowledge and understanding of Target Heart Rate Range (THRR) Yes       Intervention Provide education and explanation of THRR including how the numbers were predicted and where they are located for reference       Expected Outcomes Short Term: Able to state/look up THRR;Long Term: Able to use THRR to govern intensity when exercising independently;Short Term: Able to use daily as guideline for intensity in rehab       Able to check pulse independently Yes       Intervention Provide education and demonstration on how to check pulse in  carotid and radial arteries.;Review the importance of being able to check your own pulse for safety during independent exercise       Expected Outcomes Short Term: Able to explain why pulse checking is important during independent exercise       Understanding of Exercise Prescription Yes       Intervention Provide education, explanation, and written materials on patient's individual exercise prescription       Expected Outcomes Short Term: Able to explain program exercise prescription;Long Term: Able to explain home exercise prescription to exercise independently                Exercise Goals Re-Evaluation :  Exercise Goals Re-Evaluation     Row Name 02/18/24 1554 03/04/24 1649 03/16/24 1452 04/01/24 1751 04/07/24 1550     Exercise Goal Re-Evaluation   Exercise Goals Review Increase Physical Activity;Able to understand and use rate of perceived exertion (RPE) scale;Knowledge and understanding of Target Heart Rate Range (THRR);Understanding of Exercise Prescription;Increase Strength and Stamina;Able to understand and use Dyspnea scale;Able to check pulse independently Increase Physical Activity;Increase Strength and Stamina;Understanding of Exercise Prescription Increase Physical Activity;Increase Strength and Stamina;Understanding of Exercise Prescription Increase Physical Activity;Increase Strength and Stamina;Understanding of Exercise Prescription Increase Physical Activity;Able to understand and use rate of perceived exertion (RPE) scale;Knowledge and understanding of Target Heart Rate Range (THRR);Understanding of Exercise Prescription;Increase Strength and Stamina;Able to understand and use Dyspnea scale;Able to check pulse independently   Comments Reviewed RPE and dyspnea scale, THR and program prescription with pt today.  Pt  voiced understanding and was given a copy of goals to take home. Mathilde is off to a good start in the program. She was able to attend her first few sessions during this  weeks review period. During these sessions she was able to increase from level 2 to 3 on the T4 nustep, and increase from level 2 to 4 on the XR. We will continue to monitor her progress in the program. Nancy [name] is doing well in the program and only attended 2 sessions in this review. She maintained level 4 on the XR and a speed of 1.5 mph on the treadmiil with no incline. She also added the biostep at level 1 to her exercise prescription. We will continue to monitor her progress in the program. Nancy [name] is doing well in the program and only attended 2 sessions in this review. She increased to level 4 on the T4 nustep. She also used the T6 nustep at level 3 and the treadmill at a speed of 1.3 mph with no incline. We will continue to monitor her progress in the program. Reviewed home exercise with pt today.  Pt plans to use hand weights and walk for exercise.  Reviewed THR, pulse, RPE, sign and symptoms, pulse oximetery and when to call 911 or MD.  Also discussed weather considerations and indoor options.  Pt voiced understanding.   Expected Outcomes Short: Use RPE daily to regulate intensity.  Long: Follow program prescription in THR. Short: Continue to follow exercise prescription. Long: Continue exercise to improve strength and stamina. Short: Attend rehab more consistently. Long: Continue exercise to improve strength and stamina. Short: Attend rehab more consistently. Long: Continue exercise to improve strength and stamina. Short: add 1-2 days a week of exercise at home on off days of rehab. Long: Maintain independent exercise routine.            Discharge Exercise Prescription (Final Exercise Prescription Changes):  Exercise Prescription Changes - 04/07/24 1500       Response to Exercise   Duration Continue with 30 min of aerobic exercise without signs/symptoms of physical distress.    Intensity THRR unchanged      Progression   Progression Continue to progress workloads to maintain intensity without  signs/symptoms of physical distress.    Average METs 2.02      Resistance Training   Training Prescription Yes    Weight 3    Reps 10-15      Interval Training   Interval Training No      Oxygen    Oxygen  Continuous    Liters 2      Treadmill   MPH 1.3    Grade 0    Minutes 15    METs 1.99      NuStep   Level 4    Minutes 15    METs 2.1      T5 Nustep   Level 3   T6   SPM 80    Minutes 15    METs 2      Home Exercise Plan   Plans to continue exercise at Home (comment)   weights and walking   Frequency Add 2 additional days to program exercise sessions.    Initial Home Exercises Provided 04/07/24      Oxygen    Maintain Oxygen  Saturation 88% or higher             Nutrition:  Target Goals: Understanding of nutrition guidelines, daily intake of sodium 1500mg , cholesterol 200mg , calories 30% from fat  and 7% or less from saturated fats, daily to have 5 or more servings of fruits and vegetables.  Education: All About Nutrition: -Group instruction provided by verbal, written material, interactive activities, discussions, models, and posters to present general guidelines for heart healthy nutrition including fat, fiber, MyPlate, the role of sodium in heart healthy nutrition, utilization of the nutrition label, and utilization of this knowledge for meal planning. Follow up email sent as well. Written material given at graduation. Flowsheet Row Pulmonary Rehab from 01/29/2023 in [facility] Cardiac and Pulmonary Rehab  Education need identified 11/18/22       Biometrics:  Pre Biometrics - 02/11/24 1535       Pre Biometrics   Height 5' 7.55" (1.716 m)    Weight 201 lb 11.2 oz (91.5 kg)    Waist Circumference 39.5 inches    Hip Circumference 43 inches    Waist to Hip Ratio 0.92 %    BMI (Calculated) 31.07    Single Leg Stand 7.34 seconds              Nutrition Therapy Plan and Nutrition Goals:   Nutrition Assessments:  MEDIFICTS Score Key: >=70 Need to  make dietary changes  40-70 Heart Healthy Diet <= 40 Therapeutic Level Cholesterol Diet  Flowsheet Row Pulmonary Rehab from 02/13/2023 in [facility] Cardiac and Pulmonary Rehab  Picture Your Plate Total Score on Discharge 56      Picture Your Plate Scores: <29 Unhealthy dietary pattern with much room for improvement. 41-50 Dietary pattern unlikely to meet recommendations for good health and room for improvement. 51-60 More healthful dietary pattern, with some room for improvement.  >60 Healthy dietary pattern, although there may be some specific behaviors that could be improved.   Nutrition Goals Re-Evaluation:  Nutrition Goals Re-Evaluation     Row Name 03/10/24 1535 04/01/24 1556           Goals   Nutrition Goal Nancy [name] has deferred to meet with the RD at this time. She said she may be open to meeting with him in the future. --      Comment -- Patient was informed on why it is important to maintain a balanced diet when dealing with Respiratory issues. Explained that it takes a lot of energy to breath and when they are short of breath often they will need to have a good diet to help keep up with the calories they are expending for breathing.      Expected Outcome -- Short: Choose and plan snacks accordingly to patients caloric intake to improve breathing. Long: Maintain a diet independently that meets their caloric intake to aid in daily shortness of breath.               Nutrition Goals Discharge (Final Nutrition Goals Re-Evaluation):  Nutrition Goals Re-Evaluation - 04/01/24 1556       Goals   Comment Patient was informed on why it is important to maintain a balanced diet when dealing with Respiratory issues. Explained that it takes a lot of energy to breath and when they are short of breath often they will need to have a good diet to help keep up with the calories they are expending for breathing.    Expected Outcome Short: Choose and plan snacks accordingly to patients caloric  intake to improve breathing. Long: Maintain a diet independently that meets their caloric intake to aid in daily shortness of breath.             Psychosocial:  Target Goals: Acknowledge presence or absence of significant depression and/or stress, maximize coping skills, provide positive support system. Participant is able to verbalize types and ability to use techniques and skills needed for reducing stress and depression.   Education: Stress, Anxiety, and Depression - Group verbal and visual presentation to define topics covered.  Reviews how body is impacted by stress, anxiety, and depression.  Also discusses healthy ways to reduce stress and to treat/manage anxiety and depression.  Written material given at graduation. Flowsheet Row Pulmonary Rehab from 01/29/2023 in [facility] Cardiac and Pulmonary Rehab  Date 01/22/23  Educator KW  Instruction Review Code 1- [employer] Understanding       Education: Sleep Hygiene -Provides group verbal and written instruction about how sleep can affect your health.  Define sleep hygiene, discuss sleep cycles and impact of sleep habits. Review good sleep hygiene tips.    Initial Review & Psychosocial Screening:  Initial Psych Review & Screening - 12/24/23 1343       Initial Review   Current issues with None Identified      Family Dynamics   Good Support System? Yes    Comments She can look look to her sister for support. She lives alone and feels like she has no mental instability.      Barriers   Psychosocial barriers to participate in program The patient should benefit from training in stress management and relaxation.;There are no identifiable barriers or psychosocial needs.      Screening Interventions   Interventions Encouraged to exercise;Provide feedback about the scores to participant;Program counselor consult;To provide support and resources with identified psychosocial needs    Expected Outcomes Short Term goal: Utilizing psychosocial  counselor, staff and physician to assist with identification of specific Stressors or current issues interfering with healing process. Setting desired goal for each stressor or current issue identified.;Long Term Goal: Stressors or current issues are controlled or eliminated.;Short Term goal: Identification and review with participant of any Quality of Life or Depression concerns found by scoring the questionnaire.;Long Term goal: The participant improves quality of Life and PHQ9 Scores as seen by post scores and/or verbalization of changes             Quality of Life Scores:  Scores of 19 and below usually indicate a poorer quality of life in these areas.  A difference of  2-3 points is a clinically meaningful difference.  A difference of 2-3 points in the total score of the Quality of Life Index has been associated with significant improvement in overall quality of life, self-image, physical symptoms, and general health in studies assessing change in quality of life.  PHQ-9: Review Flowsheet  More data exists      02/11/2024 08/20/2023 02/13/2023 01/28/2023 11/18/2022  Depression screen PHQ 2/9  Decreased Interest 0 0 1 0 1  Down, Depressed, Hopeless 0 0 0 0 0  PHQ - 2 Score 0 0 1 0 1  Altered sleeping 0 1 0 0 0  Tired, decreased energy 0 1 3 3 3   Change in appetite 0 0 0 0 0  Feeling bad or failure about yourself  0 0 0 0 0  Trouble concentrating 0 0 0 0 0  Moving slowly or fidgety/restless 0 0 0 0 0  Suicidal thoughts 0 0 0 0 0  PHQ-9 Score 0 2 4 3 4   Difficult doing work/chores - - Somewhat difficult Not difficult at all -   Interpretation of Total Score  Total Score Depression Severity:  1-4 = Minimal depression, 5-9 = Mild depression, 10-14 = Moderate depression, 15-19 = Moderately severe depression, 20-27 = Severe depression   Psychosocial Evaluation and Intervention:  Psychosocial Evaluation - 12/24/23 1344       Psychosocial Evaluation & Interventions   Interventions  Encouraged to exercise with the program and follow exercise prescription    Comments She can look look to her sister for support. She lives alone and feels like she has no mental instability.    Expected Outcomes Short: attend pulmonary rehab for education and exercise. Long: develop and maintain positive self care habits.    Continue Psychosocial Services  Follow up required by staff             Psychosocial Re-Evaluation:  Psychosocial Re-Evaluation     Row Name 03/10/24 1536 04/01/24 1557           Psychosocial Re-Evaluation   Current issues with Current Stress Concerns;Current Sleep Concerns Current Stress Concerns;Current Sleep Concerns      Comments Nancy [name] reports no major stressors at this time. Nancy [name] states that she is frustrated with her breathing as it has restricted her from doing the things she enjoys. She states that se struggles with staying asleep at this time. She has been taking tylenol  PM, but reports it only helps her sleep sometimes. Patient reports no issues with their current mental states, sleep, stress, depression or anxiety. Will follow up with patient in a few weeks for any changes.      Expected Outcomes Short: Continue to attend rehab for exercise and mental boost. Long: Maintain positive outlook. Short: Continue to exercise regularly to support mental health and notify staff of any changes. Long: maintain mental health and well being through teaching of rehab or prescribed medications independently.      Interventions Encouraged to attend Pulmonary Rehabilitation for the exercise Encouraged to attend Pulmonary Rehabilitation for the exercise      Continue Psychosocial Services  No Follow up required Follow up required by staff        Initial Review   Source of Stress Concerns Chronic Illness --      Comments Her shortness of breath is her main stressor --               Psychosocial Discharge (Final Psychosocial Re-Evaluation):  Psychosocial Re-Evaluation  - 04/01/24 1557       Psychosocial Re-Evaluation   Current issues with Current Stress Concerns;Current Sleep Concerns    Comments Patient reports no issues with their current mental states, sleep, stress, depression or anxiety. Will follow up with patient in a few weeks for any changes.    Expected Outcomes Short: Continue to exercise regularly to support mental health and notify staff of any changes. Long: maintain mental health and well being through teaching of rehab or prescribed medications independently.    Interventions Encouraged to attend Pulmonary Rehabilitation for the exercise    Continue Psychosocial Services  Follow up required by staff             Education: Education Goals: Education classes will be provided on a weekly basis, covering required topics. Participant will state understanding/return demonstration of topics presented.  Learning Barriers/Preferences:   General Pulmonary Education Topics:  Infection Prevention: - Provides verbal and written material to individual with discussion of infection control including proper hand washing and proper equipment cleaning during exercise session. Flowsheet Row Pulmonary Rehab from 02/11/2024 in [facility] Cardiac and Pulmonary Rehab  Date 02/11/24  Educator [facility]  Instruction Review Code 1- Verbalizes Understanding       Falls Prevention: - Provides verbal and written material to individual with discussion of falls prevention and safety. Flowsheet Row Pulmonary Rehab from 02/11/2024 in [facility] Cardiac and Pulmonary Rehab  Date 02/11/24  Educator [facility]  Instruction Review Code 1- Verbalizes Understanding       Chronic Lung Disease Review: - Group verbal instruction with posters, models, PowerPoint presentations and videos,  to review new updates, new respiratory medications, new advancements in procedures and treatments. Providing information on websites and "800" numbers for continued self-education. Includes information about  supplement oxygen , available portable oxygen  systems, continuous and intermittent flow rates, oxygen  safety, concentrators, and Medicare reimbursement for oxygen . Explanation of Pulmonary Drugs, including class, frequency, complications, importance of spacers, rinsing mouth after steroid MDI's, and proper cleaning methods for nebulizers. Review of basic lung anatomy and physiology related to function, structure, and complications of lung disease. Review of risk factors. Discussion about methods for diagnosing sleep apnea and types of masks and machines for OSA. Includes a review of the use of types of environmental controls: home humidity, furnaces, filters, dust mite/pet prevention, HEPA vacuums. Discussion about weather changes, air quality and the benefits of nasal washing. Instruction on Warning signs, infection symptoms, calling MD promptly, preventive modes, and value of vaccinations. Review of effective airway clearance, coughing and/or vibration techniques. Emphasizing that all should Create an Action Plan. Written material given at graduation. Flowsheet Row Pulmonary Rehab from 01/29/2023 in [facility] Cardiac and Pulmonary Rehab  Education need identified 11/18/22  Date 01/15/23  Educator [facility]  Instruction Review Code 1- Verbalizes Understanding       AED/CPR: - Group verbal and written instruction with the use of models to demonstrate the basic use of the AED with the basic ABC's of resuscitation.    Anatomy and Cardiac Procedures: - Group verbal and visual presentation and models provide information about basic cardiac anatomy and function. Reviews the testing methods done to diagnose heart disease and the outcomes of the test results. Describes the treatment choices: Medical Management, Angioplasty, or Coronary Bypass Surgery for treating various heart conditions including Myocardial Infarction, Angina, Valve Disease, and Cardiac Arrhythmias.  Written material given at graduation. Flowsheet Row  Pulmonary Rehab from 01/29/2023 in [facility] Cardiac and Pulmonary Rehab  Date 12/18/22  Educator SB  Instruction Review Code 1- Verbalizes Understanding       Medication Safety: - Group verbal and visual instruction to review commonly prescribed medications for heart and lung disease. Reviews the medication, class of the drug, and side effects. Includes the steps to properly store meds and maintain the prescription regimen.  Written material given at graduation.   Other: -Provides group and verbal instruction on various topics (see comments) Flowsheet Row Pulmonary Rehab from 01/29/2023 in [facility] Cardiac and Pulmonary Rehab  Date 12/25/22  Educator SB  Instruction Review Code 1- Verbalizes Understanding  [Jeopardy]       Knowledge Questionnaire Score:    Core Components/Risk Factors/Patient Goals at Admission:  Personal Goals and Risk Factors at Admission - 12/24/23 1342       Core Components/Risk Factors/Patient Goals on Admission    Weight Management Yes;Weight Loss    Intervention Weight Management: Develop a combined nutrition and exercise program designed to reach desired caloric intake, while maintaining appropriate intake of nutrient and fiber, sodium and fats, and appropriate energy expenditure required for the weight goal.;Weight Management: Provide education and appropriate resources to help participant work on and attain dietary goals.;Weight  Management/Obesity: Establish reasonable short term and long term weight goals.;Obesity: Provide education and appropriate resources to help participant work on and attain dietary goals.    Expected Outcomes Short Term: Continue to assess and modify interventions until short term weight is achieved;Long Term: Adherence to nutrition and physical activity/exercise program aimed toward attainment of established weight goal;Weight Loss: Understanding of general recommendations for a balanced deficit meal plan, which promotes 1-2 lb weight loss per  week and includes a negative energy balance of [phone] kcal/d;Understanding recommendations for meals to include 15-35% energy as protein, 25-35% energy from fat, 35-60% energy from carbohydrates, less than 200mg  of dietary cholesterol, 20-35 gm of total fiber daily;Understanding of distribution of calorie intake throughout the day with the consumption of 4-5 meals/snacks    Improve shortness of breath with ADL's Yes    Intervention Provide education, individualized exercise plan and daily activity instruction to help decrease symptoms of SOB with activities of daily living.    Expected Outcomes Short Term: Improve cardiorespiratory fitness to achieve a reduction of symptoms when performing ADLs;Long Term: Be able to perform more ADLs without symptoms or delay the onset of symptoms    Hypertension Yes    Intervention Provide education on lifestyle modifcations including regular physical activity/exercise, weight management, moderate sodium restriction and increased consumption of fresh fruit, vegetables, and low fat dairy, alcohol moderation, and smoking cessation.;Monitor prescription use compliance.    Expected Outcomes Short Term: Continued assessment and intervention until BP is < 140/15mm HG in hypertensive participants. < 130/15mm HG in hypertensive participants with diabetes, heart failure or chronic kidney disease.;Long Term: Maintenance of blood pressure at goal levels.             Education:Diabetes - Individual verbal and written instruction to review signs/symptoms of diabetes, desired ranges of glucose level fasting, after meals and with exercise. Acknowledge that pre and post exercise glucose checks will be done for 3 sessions at entry of program.   Know Your Numbers and Heart Failure: - Group verbal and visual instruction to discuss disease risk factors for cardiac and pulmonary disease and treatment options.  Reviews associated critical values for Overweight/Obesity, Hypertension,  Cholesterol, and Diabetes.  Discusses basics of heart failure: signs/symptoms and treatments.  Introduces Heart Failure Zone chart for action plan for heart failure.  Written material given at graduation.   Core Components/Risk Factors/Patient Goals Review:   Goals and Risk Factor Review     Row Name 03/10/24 1541 04/01/24 1556           Core Components/Risk Factors/Patient Goals Review   Personal Goals Review Weight Management/Obesity;Improve shortness of breath with ADL's Improve shortness of breath with ADL's      Review Nancy [name] states that she would still like to lose some weight. She would like to get down to 150 lbs but is restricted by her breathing and is not able to exercise as much as she feels she needs to. She is continuing to practice PLB but states she still feels SOB. She reports still feeling SOB even when wearing her oxygen , although her O2 saturations have stayed above 90%. Spoke to patient about their shortness of breath and what they can do to improve. Patient has been informed of breathing techniques when starting the program. Patient is informed to tell staff if they have had any med changes and that certain meds they are taking or not taking can be causing shortness of breath.      Expected Outcomes Short: Continue to practice  breathing techniques to improve SOB. Long: Continue to manage lifestyle risk factors. Short: Attend [facility] regularly to improve shortness of breath with ADL's. Long: maintain independence with ADL's               Core Components/Risk Factors/Patient Goals at Discharge (Final Review):   Goals and Risk Factor Review - 04/01/24 1556       Core Components/Risk Factors/Patient Goals Review   Personal Goals Review Improve shortness of breath with ADL's    Review Spoke to patient about their shortness of breath and what they can do to improve. Patient has been informed of breathing techniques when starting the program. Patient is informed to tell  staff if they have had any med changes and that certain meds they are taking or not taking can be causing shortness of breath.    Expected Outcomes Short: Attend [facility] regularly to improve shortness of breath with ADL's. Long: maintain independence with ADL's             ITP Comments:  ITP Comments     Row Name 12/24/23 1350 02/11/24 1527 02/18/24 1125 02/18/24 1553 03/17/24 1301   ITP Comments Virtual Visit completed. Patient informed on EP and RD appointment and 6 Minute walk test. Patient also informed of patient health questionnaires on My Chart. Patient Verbalizes understanding. Visit diagnosis can be found in [facility] 12/08/2023. Completed and gym orientation. Initial ITP created and sent for review to Dr. Faud Aleskerov, Medical Director. 30 Day review completed. Medical Director ITP review done, changes made as directed, and signed approval by Medical Director.    new to program First full day of exercise!  Patient was oriented to gym and equipment including functions, settings, policies, and procedures.  Patient's individual exercise prescription and treatment plan were reviewed.  All starting workloads were established based on the results of the 6 minute walk test done at initial orientation visit.  The plan for exercise progression was also introduced and progression will be customized based on patient's performance and goals. 30 Day review completed. Medical Director ITP review done, changes made as directed, and signed approval by Medical Director. New to program.    Row Name 04/14/24 1051           ITP Comments 30 Day review completed. Medical Director ITP review done, changes made as directed, and signed approval by Medical Director.                Comments:

## 2024-04-15 ENCOUNTER — Encounter: Attending: Specialist | Admitting: *Deleted

## 2024-04-15 DIAGNOSIS — J449 Chronic obstructive pulmonary disease, unspecified: Secondary | ICD-10-CM | POA: Diagnosis present

## 2024-04-15 NOTE — Progress Notes (Signed)
 Daily Session Note  Patient Details  Name: Nancy [name] MRN: [id number] Date of Birth: [birth year] Referring Provider:   Flowsheet Row Pulmonary Rehab from 02/11/2024 in [facility] Cardiac and Pulmonary Rehab  Referring Provider Dr. Alain Aliment       Encounter Date: 04/15/2024  Check In:  Session Check In - 04/15/24 1545       Check-In   Supervising physician immediately available to respond to emergencies See telemetry face sheet for immediately available ER MD    Location [facility]-Cardiac & Pulmonary Rehab    Staff Present Maud Sorenson, RN, BSN, CCRP;Maxon Conetta BS, Exercise Physiologist;Noah Tickle, BS, Exercise Physiologist;Joseph Hood RCP,RRT,BSRT    Virtual Visit No    Medication changes reported     No    Fall or balance concerns reported    No    Warm-up and Cool-down Performed on first and last piece of equipment    Resistance Training Performed Yes    VAD Patient? No    PAD/SET Patient? No      Pain Assessment   Currently in Pain? No/denies                Social History   Tobacco Use  Smoking Status Former   Current packs/day: 0.00   Average packs/day: 0.5 packs/day for 30.0 years (15.0 ttl pk-yrs)   Types: Cigarettes   Start date: 03/11/1986   Quit date: 03/11/2016   Years since quitting: 8.1  Smokeless Tobacco Never  Tobacco Comments   patient states  that she is working at her own pace to quit smoking    Goals Met:  Proper associated with RPD/PD & O2 Sat Independence with exercise equipment Exercise tolerated well No report of concerns or symptoms today  Goals Unmet:  Not Applicable  Comments: Pt able to follow exercise prescription today without complaint.  Will continue to monitor for progression.    Dr. Firman Hughes is Medical Director for [facility] Cardiac Rehabilitation.  Dr. Fuad Aleskerov is Medical Director for [facility] Pulmonary Rehabilitation.

## 2024-04-21 ENCOUNTER — Encounter

## 2024-04-22 ENCOUNTER — Encounter

## 2024-04-28 ENCOUNTER — Encounter

## 2024-04-29 ENCOUNTER — Encounter: Admitting: *Deleted

## 2024-04-29 DIAGNOSIS — J449 Chronic obstructive pulmonary disease, unspecified: Secondary | ICD-10-CM | POA: Diagnosis not present

## 2024-04-29 NOTE — Progress Notes (Signed)
 Daily Session Note  Patient Details  Name: MAKARI SANKO MRN: [id number] Date of Birth: [birth year] Referring Provider:   Flowsheet Row Pulmonary Rehab from 02/11/2024 in [facility] Cardiac and Pulmonary Rehab  Referring Provider Dr. Alain Aliment    Encounter Date: 04/29/2024  Check In:  Session Check In - 04/29/24 1550       Check-In   Supervising physician immediately available to respond to emergencies See telemetry face sheet for immediately available ER MD    Location [facility]-Cardiac & Pulmonary Rehab    Staff Present Sue Em RN,BSN;Joseph Neldon [place], [place], RRT, CPFT;Noah Tickle, BS, Exercise Physiologist    Virtual Visit No    Medication changes reported     No    Fall or balance concerns reported    No    Warm-up and Cool-down Performed on first and last piece of equipment    Resistance Training Performed Yes    VAD Patient? No    PAD/SET Patient? No      Pain Assessment   Currently in Pain? No/denies             Social History   Tobacco Use  Smoking Status Former   Current packs/day: 0.00   Average packs/day: 0.5 packs/day for 30.0 years (15.0 ttl pk-yrs)   Types: Cigarettes   Start date: 03/11/1986   Quit date: 03/11/2016   Years since quitting: 8.1  Smokeless Tobacco Never  Tobacco Comments   patient states  that she is working at her own pace to quit smoking    Goals Met:  Independence with exercise equipment Exercise tolerated well No report of concerns or symptoms today Strength training completed today  Goals Unmet:  Not Applicable  Comments: Pt able to follow exercise prescription today without complaint.  Will continue to monitor for progression.    Dr. Firman Hughes is Medical Director for [facility] Cardiac Rehabilitation.  Dr. Fuad Aleskerov is Medical Director for [facility] Pulmonary Rehabilitation.

## 2024-05-05 ENCOUNTER — Encounter: Admitting: *Deleted

## 2024-05-05 DIAGNOSIS — J449 Chronic obstructive pulmonary disease, unspecified: Secondary | ICD-10-CM | POA: Diagnosis not present

## 2024-05-05 NOTE — Progress Notes (Signed)
 Daily Session Note  Patient Details  Name: MARCELE KOSTA MRN: [id number] Date of Birth: [birth year] Referring Provider:   Flowsheet Row Pulmonary Rehab from 02/11/2024 in [facility] Cardiac and Pulmonary Rehab  Referring Provider Dr. Lavelle Servant    Encounter Date: 05/05/2024  Check In:  Session Check In - 05/05/24 1531       Check-In   Supervising physician immediately available to respond to emergencies See telemetry face sheet for immediately available ER MD    Location [facility]-Cardiac & Pulmonary Rehab    Staff Present Hoy Rodney RN,BSN;Joseph [facility] Dyane BS, ACSM CEP, Exercise Physiologist;Maxon Conetta BS, Exercise Physiologist    Virtual Visit No    Medication changes reported     No    Fall or balance concerns reported    No    Warm-up and Cool-down Performed on first and last piece of equipment    Resistance Training Performed Yes    VAD Patient? No    PAD/SET Patient? No      Pain Assessment   Currently in Pain? No/denies             Social History   Tobacco Use  Smoking Status Former   Current packs/day: 0.00   Average packs/day: 0.5 packs/day for 30.0 years (15.0 ttl pk-yrs)   Types: Cigarettes   Start date: 03/11/1986   Quit date: 03/11/2016   Years since quitting: 8.1  Smokeless Tobacco Never  Tobacco Comments   patient states  that she is working at her own pace to quit smoking    Goals Met:  Independence with exercise equipment Exercise tolerated well No report of concerns or symptoms today Strength training completed today  Goals Unmet:  Not Applicable  Comments: Pt able to follow exercise prescription today without complaint.  Will continue to monitor for progression.    Dr. Oneil Pinal is Medical Director for [facility] Cardiac Rehabilitation.  Dr. Fuad Aleskerov is Medical Director for [facility] Pulmonary Rehabilitation.

## 2024-05-06 ENCOUNTER — Encounter: Admitting: *Deleted

## 2024-05-06 DIAGNOSIS — J449 Chronic obstructive pulmonary disease, unspecified: Secondary | ICD-10-CM

## 2024-05-06 NOTE — Progress Notes (Signed)
 Daily Session Note  Patient Details  Name: Nancy [name] MRN: [id number] Date of Birth: August 28, 1961 Referring Provider:   Flowsheet Row Pulmonary Rehab from 02/11/2024 in [facility] Cardiac and Pulmonary Rehab  Referring Provider Dr. Lavelle Servant    Encounter Date: 05/06/2024  Check In:  Session Check In - 05/06/24 1525       Check-In   Supervising physician immediately available to respond to emergencies See telemetry face sheet for immediately available ER MD    Location [facility]-Cardiac & Pulmonary Rehab    Staff Present Hoy Rodney RN,BSN;Joseph [facility] BS, Exercise Physiologist;Noah Tickle, BS, Exercise Physiologist    Virtual Visit No    Medication changes reported     No    Fall or balance concerns reported    No    Warm-up and Cool-down Performed on first and last piece of equipment    Resistance Training Performed Yes    VAD Patient? No    PAD/SET Patient? No      Pain Assessment   Currently in Pain? No/denies             Social History   Tobacco Use  Smoking Status Former   Current packs/day: 0.00   Average packs/day: 0.5 packs/day for 30.0 years (15.0 ttl pk-yrs)   Types: Cigarettes   Start date: 03/11/1986   Quit date: 03/11/2016   Years since quitting: 8.1  Smokeless Tobacco Never  Tobacco Comments   patient states  that she is working at her own pace to quit smoking    Goals Met:  Independence with exercise equipment Exercise tolerated well No report of concerns or symptoms today Strength training completed today  Goals Unmet:  Not Applicable  Comments: Pt able to follow exercise prescription today without complaint.  Will continue to monitor for progression.    Dr. Oneil Pinal is Medical Director for [facility] Cardiac Rehabilitation.  Dr. Fuad Aleskerov is Medical Director for [facility] Pulmonary Rehabilitation.

## 2024-05-12 ENCOUNTER — Encounter: Payer: Self-pay | Admitting: *Deleted

## 2024-05-12 ENCOUNTER — Encounter

## 2024-05-12 DIAGNOSIS — J449 Chronic obstructive pulmonary disease, unspecified: Secondary | ICD-10-CM

## 2024-05-12 NOTE — Progress Notes (Signed)
 Pulmonary Individual Treatment Plan  Patient Details  Name: Nancy LUEDKE MRN: [id number] Date of Birth: Jul 31, 1961 Referring Provider:   Flowsheet Row Pulmonary Rehab from 02/11/2024 in [facility] Cardiac and Pulmonary Rehab  Referring Provider Dr. Lavelle Servant    Initial Encounter Date:  Flowsheet Row Pulmonary Rehab from 02/11/2024 in [facility] Cardiac and Pulmonary Rehab  Date 02/11/24    Visit Diagnosis: Stage 3 severe COPD by GOLD classification (HCC)  Patient's Home Medications on Admission:  Current Outpatient Medications:    acetaminophen  (TYLENOL ) 500 MG tablet, Take 500 mg by mouth every 4 (four) hours as needed., Disp: , Rfl:    albuterol  (VENTOLIN  HFA) 108 (90 Base) MCG/ACT inhaler, SMARTSIG:2 Inhalation Via Inhaler Every 6 Hours PRN, Disp: , Rfl:    amLODipine  (NORVASC ) 5 MG tablet, Take 5 mg by mouth daily., Disp: , Rfl:    benzonatate  (TESSALON ) 200 MG capsule, Take 1 capsule (200 mg total) by mouth 2 (two) times daily as needed for cough., Disp: 20 capsule, Rfl: 0   diclofenac  Sodium (VOLTAREN ) 1 % GEL, Apply 2 g topically 4 (four) times daily., Disp: 100 g, Rfl: 0   doxycycline  (PERIOSTAT ) 20 MG tablet, Take one tab po BID with food., Disp: 60 tablet, Rfl: 11   DULoxetine  (CYMBALTA ) 20 MG capsule, Take 2 capsules (40 mg total) by mouth 2 (two) times daily., Disp: 360 capsule, Rfl: 1   enoxaparin  (LOVENOX ) 100 MG/ML injection, Inject into the skin. (Patient not taking: Reported on 12/24/2023), Disp: , Rfl:    enoxaparin  (LOVENOX ) 40 MG/0.4ML injection, Inject 90 mg into the skin daily. (Patient not taking: Reported on 12/24/2023), Disp: , Rfl:    Fluticasone -Umeclidin-Vilant (TRELEGY ELLIPTA ) 100-62.5-25 MCG/ACT AEPB, Inhale into the lungs., Disp: , Rfl:    furosemide  (LASIX ) 40 MG tablet, Take 40 mg by mouth 2 (two) times daily. (Patient not taking: Reported on 12/24/2023), Disp: , Rfl:    furosemide  (LASIX ) 40 MG tablet, Take 1 tablet by mouth 2 (two) times daily., Disp: , Rfl:     ipratropium-albuterol  (DUONEB) 0.5-2.5 (3) MG/3ML SOLN, Inhale into the lungs., Disp: , Rfl:    levofloxacin  (LEVAQUIN ) 750 MG tablet, Take 1 tablet (750 mg total) by mouth daily., Disp: 7 tablet, Rfl: 0   levothyroxine  (SYNTHROID ) 125 MCG tablet, Take 1 tablet (125 mcg total) by mouth daily before breakfast., Disp: 90 tablet, Rfl: 3   lidocaine  (LIDODERM ) 5 %, Place 1 patch onto the skin every 12 (twelve) hours. Remove & Discard patch within 12 hours or as directed by MD, Disp: 15 patch, Rfl: 0   metoprolol  succinate (TOPROL -XL) 25 MG 24 hr tablet, Take 25 mg by mouth daily., Disp: , Rfl:    Na Sulfate-K Sulfate-Mg Sulfate concentrate 17.5-3.13-1.6 GM/177ML SOLN, , Disp: , Rfl:    ondansetron  (ZOFRAN  ODT) 4 MG disintegrating tablet, Take 1 tablet (4 mg total) by mouth every 8 (eight) hours as needed for nausea or vomiting., Disp: 20 tablet, Rfl: 2   oxyCODONE  (OXY IR/ROXICODONE ) 5 MG immediate release tablet, Take 1 tablet (5 mg total) by mouth every 6 (six) hours as needed for moderate pain., Disp: 30 tablet, Rfl: 0   polyethylene glycol (MIRALAX  / GLYCOLAX ) 17 g packet, Take 17 g by mouth daily as needed., Disp: , Rfl:    senna-docusate (SENOKOT-S) 8.6-50 MG tablet, Take 1 tablet by mouth 2 (two) times daily as needed for mild constipation., Disp: 20 tablet, Rfl: 0   TRELEGY ELLIPTA  100-62.5-25 MCG/ACT AEPB, Inhale into the lungs., Disp: ,  Rfl:    valACYclovir  (VALTREX ) 1000 MG tablet, Take 1 tablet (1,000 mg total) by mouth daily., Disp: 6 tablet, Rfl: 0   warfarin (COUMADIN ) 5 MG tablet, Take 5 mg by mouth every evening. Per cardiologist: patient supposed to take 7.5 mg 9/24 and 9/25 evening for low INR then resume 5 mg nightly  Goal INR 2.5-3.5, Disp: , Rfl:   Current Facility-Administered Medications:    ipratropium-albuterol  (DUONEB) 0.5-2.5 (3) MG/3ML nebulizer solution 3 mL, 3 mL, Nebulization, Once, Pollak, Adriana M, PA-C  Past Medical History: Past Medical History:  Diagnosis Date    Aortic dissection (HCC) 2010   COPD (chronic obstructive pulmonary disease) (HCC)    Depression    Hypertension    Hypothyroidism    Pneumonia    PONV (postoperative nausea and vomiting)    Rosacea    Seasonal allergies    Sleep apnea    Does not use CPAP    Tobacco Use: Social History   Tobacco Use  Smoking Status Former   Current packs/day: 0.00   Average packs/day: 0.5 packs/day for 30.0 years (15.0 ttl pk-yrs)   Types: Cigarettes   Start date: 03/11/1986   Quit date: 03/11/2016   Years since quitting: 8.1  Smokeless Tobacco Never  Tobacco Comments   patient states  that she is working at her own pace to quit smoking    Labs: Review Flowsheet  More data may exist      Latest Ref Rng & Units 07/02/2009 08/29/2015 01/05/2018 09/29/2020 08/20/2023  Labs for ITP Cardiac and Pulmonary Rehab  Cholestrol 100 - 199 mg/dL - 823  814  832  837   LDL (calc) 0 - 99 mg/dL - 896  888  898  894   HDL-C >39 mg/dL - 53  50  45  38   Trlycerides 0 - 149 mg/dL - 897  880  883  894   Hemoglobin A1c 4.8 - 5.6 % - - - - 4.8   TCO2 0 - 100 mmol/L 22  - - - -     Pulmonary Assessment Scores:  Pulmonary Assessment Scores     Row Name 02/11/24 1537         mMRC Score   mMRC Score 3        UCSD: Self-administered rating of dyspnea associated with activities of daily living (ADLs) 6-point scale (0 = not at all to 5 = maximal or unable to do because of breathlessness)  Scoring Scores range from 0 to 120.  Minimally important difference is 5 units  CAT: CAT can identify the health impairment of COPD patients and is better correlated with disease progression.  CAT has a scoring range of zero to 40. The CAT score is classified into four groups of low (less than 10), medium (10 - 20), high (21-30) and very high (31-40) based on the impact level of disease on health status. A CAT score over 10 suggests significant symptoms.  A worsening CAT score could be explained by an exacerbation,  poor medication adherence, poor inhaler technique, or progression of COPD or comorbid conditions.  CAT MCID is 2 points  mMRC: mMRC (Modified Medical Research Council) Dyspnea Scale is used to assess the degree of baseline functional disability in patients of respiratory disease due to dyspnea. No minimal important difference is established. A decrease in score of 1 point or greater is considered a positive change.   Pulmonary Function Assessment:  Pulmonary Function Assessment - 12/24/23 1342  Breath   Shortness of Breath Yes;Limiting activity          Exercise Target Goals: Exercise Program Goal: Individual exercise prescription set using results from initial 6 min walk test and THRR while considering  patient's activity barriers and safety.   Exercise Prescription Goal: Initial exercise prescription builds to 30-45 minutes a day of aerobic activity, 2-3 days per week.  Home exercise guidelines will be given to patient during program as part of exercise prescription that the participant will acknowledge.  Education: Aerobic Exercise: - Group verbal and visual presentation on the components of exercise prescription. Introduces F.I.T.T principle from ACSM for exercise prescriptions.  Reviews F.I.T.T. principles of aerobic exercise including progression. Written material given at graduation. Flowsheet Row Pulmonary Rehab from 01/29/2023 in [facility] Cardiac and Pulmonary Rehab  Date 11/27/22  Educator KW  Instruction Review Code 1- [employer] Understanding    Education: Resistance Exercise: - Group verbal and visual presentation on the components of exercise prescription. Introduces F.I.T.T principle from ACSM for exercise prescriptions  Reviews F.I.T.T. principles of resistance exercise including progression. Written material given at graduation. Flowsheet Row Pulmonary Rehab from 01/29/2023 in [facility] Cardiac and Pulmonary Rehab  Date 12/04/22  Educator KW  Instruction Review Code  1- [employer] Understanding     Education: Exercise & Equipment Safety: - Individual verbal instruction and demonstration of equipment use and safety with use of the equipment. Flowsheet Row Pulmonary Rehab from 02/11/2024 in [facility] Cardiac and Pulmonary Rehab  Date 02/11/24  Educator [facility]  Instruction Review Code 1- Verbalizes Understanding    Education: Exercise Physiology & General Exercise Guidelines: - Group verbal and written instruction with models to review the exercise physiology of the cardiovascular system and associated critical values. Provides general exercise guidelines with specific guidelines to those with heart or lung disease.    Education: Flexibility, Balance, Mind/Body Relaxation: - Group verbal and visual presentation with interactive activity on the components of exercise prescription. Introduces F.I.T.T principle from ACSM for exercise prescriptions. Reviews F.I.T.T. principles of flexibility and balance exercise training including progression. Also discusses the mind body connection.  Reviews various relaxation techniques to help reduce and manage stress (i.e. Deep breathing, progressive muscle relaxation, and visualization). Balance handout provided to take home. Written material given at graduation. Flowsheet Row Pulmonary Rehab from 01/29/2023 in [facility] Cardiac and Pulmonary Rehab  Date 12/11/22  Educator KW  Instruction Review Code 1- Verbalizes Understanding    Activity Barriers & Risk Stratification:  Activity Barriers & Cardiac Risk Stratification - 02/11/24 1530       Activity Barriers & Cardiac Risk Stratification   Activity Barriers None          6 Minute Walk:  6 Minute Walk     Row Name 02/11/24 1528         6 Minute Walk   Phase Initial     Distance 755 feet     Walk Time 5.17 minutes     # of Rest Breaks 2     MPH 1.66     METS 2.7     RPE 12     Perceived Dyspnea  4     VO2 Peak 9.56     Symptoms Yes (comment)     Comments SOB      Resting HR 98 bpm     Resting BP 122/58     Resting Oxygen  Saturation  97 %     Exercise Oxygen  Saturation  during 6 min walk 95 %  Max Ex. HR 112 bpm     Max Ex. BP 162/64       Interval HR   1 Minute HR 102     2 Minute HR 103     3 Minute HR 107     4 Minute HR 110     5 Minute HR 106     6 Minute HR 112     2 Minute Post HR 106     Interval Heart Rate? Yes       Interval Oxygen    Interval Oxygen ? Yes     Baseline Oxygen  Saturation % 97 %     1 Minute Oxygen  Saturation % 98 %     1 Minute Liters of Oxygen  2 L     2 Minute Oxygen  Saturation % 97 %     2 Minute Liters of Oxygen  2 L     3 Minute Oxygen  Saturation % 96 %     3 Minute Liters of Oxygen  2 L     4 Minute Oxygen  Saturation % 96 %     4 Minute Liters of Oxygen  2 L     5 Minute Oxygen  Saturation % 97 %     5 Minute Liters of Oxygen  2 L     6 Minute Oxygen  Saturation % 95 %     6 Minute Liters of Oxygen  2 L     2 Minute Post Oxygen  Saturation % 98 %     2 Minute Post Liters of Oxygen  2 L       Oxygen  Initial Assessment:  Oxygen  Initial Assessment - 02/11/24 1537       Home Oxygen    Home Oxygen  Device Portable Concentrator;Home Concentrator    Sleep Oxygen  Prescription Continuous    Liters per minute 2    Home Exercise Oxygen  Prescription Continuous    Liters per minute 2    Home Resting Oxygen  Prescription None    Compliance with Home Oxygen  Use Yes      Initial 6 min Walk   Oxygen  Used Continuous    Liters per minute 2      Program Oxygen  Prescription   Program Oxygen  Prescription Continuous    Liters per minute 2      Intervention   Short Term Goals To learn and exhibit compliance with exercise, home and travel O2 prescription;To learn and understand importance of monitoring SPO2 with pulse oximeter and demonstrate accurate use of the pulse oximeter.;To learn and demonstrate proper pursed lip breathing techniques or other breathing techniques. ;To learn and demonstrate proper use of respiratory  medications;To learn and understand importance of maintaining oxygen  saturations>88%    Long  Term Goals Exhibits compliance with exercise, home  and travel O2 prescription;Maintenance of O2 saturations>88%;Compliance with respiratory medication;Demonstrates proper use of MDI's;Exhibits proper breathing techniques, such as pursed lip breathing or other method taught during program session;Verbalizes importance of monitoring SPO2 with pulse oximeter and return demonstration          Oxygen  Re-Evaluation:  Oxygen  Re-Evaluation     Row Name 02/18/24 1555 03/10/24 1546 04/01/24 1558 05/05/24 1535       Program Oxygen  Prescription   Program Oxygen  Prescription -- Continuous Continuous Continuous    Liters per minute -- 2 2 2       Home Oxygen    Home Oxygen  Device -- Portable Concentrator;Home Concentrator Portable Concentrator;Home Concentrator Portable Concentrator;Home Concentrator    Sleep Oxygen  Prescription -- Continuous Continuous;CPAP  Amiylah states she cannot tolerate it.  Continuous;CPAP  does not tolerate    Liters per minute -- 2 2 2     Home Exercise Oxygen  Prescription -- Continuous Continuous Continuous    Liters per minute -- 2 2 2     Home Resting Oxygen  Prescription -- None None None    Compliance with Home Oxygen  Use -- -- No No    Comments -- -- -- --  does not wear CPAP      Goals/Expected Outcomes   Short Term Goals -- To learn and exhibit compliance with exercise, home and travel O2 prescription;To learn and understand importance of monitoring SPO2 with pulse oximeter and demonstrate accurate use of the pulse oximeter.;To learn and demonstrate proper pursed lip breathing techniques or other breathing techniques. ;To learn and demonstrate proper use of respiratory medications;To learn and understand importance of maintaining oxygen  saturations>88% Other;To learn and understand importance of maintaining oxygen  saturations>88% Other    Long  Term Goals -- Exhibits compliance with  exercise, home  and travel O2 prescription;Maintenance of O2 saturations>88%;Compliance with respiratory medication;Demonstrates proper use of MDI's;Exhibits proper breathing techniques, such as pursed lip breathing or other method taught during program session;Verbalizes importance of monitoring SPO2 with pulse oximeter and return demonstration Other;Maintenance of O2 saturations>88% Other    Comments Reviewed PLB technique with pt.  Talked about how it works and it's importance in maintaining their exercise saturations. Atonya continues to practice PLB technique, although she reports that she is still SOB even when wearing her O2. She states that she feels the same SOB while wearing her oxygen  even though her O2 saturations have stayed above 90% while on continuous O2. Talked about how PLB works and it's importance in maintaining their exercise saturations. Ansleigh states that her concentrator does not do anything for her shortness of breath so she does not use it when she is out and about. Informed her the differences in shortness of breath and oxygenation. Patient verbalizes understanding. She states she does not tolerate wearing any of her CPAP mask. Henchy states that she is not wearing her CPAP due to it falling off her face. She states it does not stay on so she does not wear it. Informed patient why it is important to wear it. She states aving about  five different mask and nothing is working for her.    Goals/Expected Outcomes Short: Become more profiecient at using PLB.   Long: Become independent at using PLB. Short: Become more profiecient at using PLB.   Long: Become independent at using PLB. Short: maintain spo2 greater than 88 percent on exertion. Long: maintain oxygen  saturation independently. Short: give her CPAP another try. Long: adhere to using a CPAP.       Oxygen  Discharge (Final Oxygen  Re-Evaluation):  Oxygen  Re-Evaluation - 05/05/24 1535       Program Oxygen  Prescription   Program Oxygen   Prescription Continuous    Liters per minute 2      Home Oxygen    Home Oxygen  Device Portable Concentrator;Home Concentrator    Sleep Oxygen  Prescription Continuous;CPAP   does not tolerate   Liters per minute 2    Home Exercise Oxygen  Prescription Continuous    Liters per minute 2    Home Resting Oxygen  Prescription None    Compliance with Home Oxygen  Use No    Comments --   does not wear CPAP     Goals/Expected Outcomes   Short Term Goals Other    Long  Term Goals Other    Comments Itzayanna states that  she is not wearing her CPAP due to it falling off her face. She states it does not stay on so she does not wear it. Informed patient why it is important to wear it. She states aving about  five different mask and nothing is working for her.    Goals/Expected Outcomes Short: give her CPAP another try. Long: adhere to using a CPAP.          Initial Exercise Prescription:  Initial Exercise Prescription - 02/11/24 1500       Date of Initial Exercise RX and Referring Provider   Date 02/11/24    Referring Provider Dr. Lavelle Servant      Oxygen    Oxygen  Continuous    Liters 2    Maintain Oxygen  Saturation 88% or higher      Treadmill   MPH 1.5    Grade 0    Minutes 15    METs 2.15      NuStep   Level 2    SPM 80    Minutes 15    METs 2.7      REL-XR   Level 2    Watts 25    Speed 50    Minutes 15    METs 2.7      Track   Laps 20    Minutes 15    METs 2.09      Intensity   THRR 40-80% of Max Heartrate 122-147    Ratings of Perceived Exertion 11-13    Perceived Dyspnea 0-4      Resistance Training   Training Prescription Yes    Weight 3    Reps 10-15          Perform Capillary Blood Glucose checks as needed.  Exercise Prescription Changes:   Exercise Prescription Changes     Row Name 02/11/24 1500 03/04/24 1600 03/16/24 1400 04/01/24 1700 04/07/24 1500     Response to Exercise   Blood Pressure (Admit) 122/58 108/60 138/66 120/70 --   Blood  Pressure (Exercise) 162/64 158/82 -- 146/72 --   Blood Pressure (Exit) 142/60 126/74 118/60 118/88 --   Heart Rate (Admit) 98 bpm 95 bpm 106 bpm 109 bpm --   Heart Rate (Exercise) 112 bpm 108 bpm 117 bpm 120 bpm --   Heart Rate (Exit) 106 bpm 90 bpm 101 bpm 108 bpm --   Oxygen  Saturation (Admit) 97 % 95 % 98 % 97 % --   Oxygen  Saturation (Exercise) 95 % 88 % 94 % 95 % --   Oxygen  Saturation (Exit) 98 % 94 % 97 % 96 % --   Rating of Perceived Exertion (Exercise) 12 13 13 13  --   Perceived Dyspnea (Exercise) 4 4 3 3  --   Symptoms SOB none none none --   Comments results First 2 weeks of exercise -- -- --   Duration -- Progress to 30 minutes of  aerobic without signs/symptoms of physical distress Continue with 30 min of aerobic exercise without signs/symptoms of physical distress. Continue with 30 min of aerobic exercise without signs/symptoms of physical distress. Continue with 30 min of aerobic exercise without signs/symptoms of physical distress.   Intensity -- THRR unchanged THRR unchanged THRR unchanged THRR unchanged     Progression   Progression -- Continue to progress workloads to maintain intensity without signs/symptoms of physical distress. Continue to progress workloads to maintain intensity without signs/symptoms of physical distress. Continue to progress workloads to maintain intensity without signs/symptoms of physical distress.  Continue to progress workloads to maintain intensity without signs/symptoms of physical distress.   Average METs -- 2.7 2.33 2.02 2.02     Resistance Training   Training Prescription -- Yes Yes Yes Yes   Weight -- 3 3 3 3    Reps -- 10-15 10-15 10-15 10-15     Interval Training   Interval Training -- No No No No     Oxygen    Oxygen  -- Continuous Continuous Continuous Continuous   Liters -- 3 2 2 2      Treadmill   MPH -- 1.5 1.5 1.3 1.3   Grade -- 0 0 0 0   Minutes -- 15 15 15 15    METs -- 2.15 2.15 1.99 1.99     Recumbant Bike   Level --  1.4 -- -- --   Watts -- 25 -- -- --   Minutes -- 15 -- -- --   METs -- 2.86 -- -- --     NuStep   Level -- 3 -- 4 4   Minutes -- 15 -- 15 15   METs -- 2.8 -- 2.1 2.1     REL-XR   Level -- 4 4 -- --   Minutes -- 15 15 -- --   METs -- 3.9 3 -- --     T5 Nustep   Level -- -- -- 3  T6 3  T6   SPM -- -- -- 80 80   Minutes -- -- -- 15 15   METs -- -- -- 2 2     Biostep-RELP   Level -- -- 1 -- --   Minutes -- -- 15 -- --   METs -- -- 2 -- --     Home Exercise Plan   Plans to continue exercise at -- -- -- -- Home (comment)  weights and walking   Frequency -- -- -- -- Add 2 additional days to program exercise sessions.   Initial Home Exercises Provided -- -- -- -- 04/07/24     Oxygen    Maintain Oxygen  Saturation -- 88% or higher 88% or higher 88% or higher 88% or higher    Row Name 04/14/24 1500 04/28/24 1400 05/11/24 1300         Response to Exercise   Blood Pressure (Admit) 138/66 112/68 132/76     Blood Pressure (Exercise) 124/80 -- --     Blood Pressure (Exit) 124/68 102/60 106/64     Heart Rate (Admit) 103 bpm 95 bpm 91 bpm     Heart Rate (Exercise) 114 bpm 110 bpm 101 bpm     Heart Rate (Exit) 105 bpm 105 bpm 98 bpm     Oxygen  Saturation (Admit) 97 % 96 % 95 %     Oxygen  Saturation (Exercise) 94 % 94 % 94 %     Oxygen  Saturation (Exit) 97 % 98 % 96 %     Rating of Perceived Exertion (Exercise) 13 13 12      Perceived Dyspnea (Exercise) 4 3 3.5     Symptoms none none none     Duration Continue with 30 min of aerobic exercise without signs/symptoms of physical distress. Continue with 30 min of aerobic exercise without signs/symptoms of physical distress. Continue with 30 min of aerobic exercise without signs/symptoms of physical distress.     Intensity THRR unchanged THRR unchanged THRR unchanged       Progression   Progression Continue to progress workloads to maintain intensity without signs/symptoms of physical distress. Continue to  progress workloads to maintain  intensity without signs/symptoms of physical distress. Continue to progress workloads to maintain intensity without signs/symptoms of physical distress.     Average METs 2.3 2.22 2.29       Resistance Training   Training Prescription Yes Yes Yes     Weight 3 3 3  lb     Reps 10-15 10-15 10-15       Interval Training   Interval Training No No No       Oxygen    Oxygen  Continuous Continuous Continuous     Liters 2 2 2        Treadmill   MPH 1.4 1.5 1.4     Grade 0 0 0     Minutes 15 15 15      METs 2.07 2.15 2.07       NuStep   Level -- 3 --     Minutes -- 15 --     METs -- 2.3 --       REL-XR   Level 4 -- 4     Minutes 15 -- 15     METs 3.3 -- 3.5       T5 Nustep   Level -- -- 3  T6 nustep     Minutes -- -- 15     METs -- -- 2       Biostep-RELP   Level 1 -- 2     Minutes 15 -- 15     METs 2 -- 2       Home Exercise Plan   Plans to continue exercise at Home (comment)  weights and walking Home (comment)  weights and walking Home (comment)  weights and walking     Frequency Add 2 additional days to program exercise sessions. Add 2 additional days to program exercise sessions. Add 2 additional days to program exercise sessions.     Initial Home Exercises Provided 04/07/24 04/07/24 04/07/24       Oxygen    Maintain Oxygen  Saturation 88% or higher 88% or higher 88% or higher        Exercise Comments:   Exercise Comments     Row Name 02/18/24 1553           Exercise Comments First full day of exercise!  Patient was oriented to gym and equipment including functions, settings, policies, and procedures.  Patient's individual exercise prescription and treatment plan were reviewed.  All starting workloads were established based on the results of the 6 minute walk test done at initial orientation visit.  The plan for exercise progression was also introduced and progression will be customized based on patient's performance and goals.          Exercise Goals and  Review:   Exercise Goals     Row Name 02/11/24 1535             Exercise Goals   Increase Physical Activity Yes       Intervention Provide advice, education, support and counseling about physical activity/exercise needs.;Develop an individualized exercise prescription for aerobic and resistive training based on initial evaluation findings, risk stratification, comorbidities and participant's personal goals.       Expected Outcomes Short Term: Attend rehab on a regular basis to increase amount of physical activity.;Long Term: Add in home exercise to make exercise part of routine and to increase amount of physical activity.;Long Term: Exercising regularly at least 3-5 days a week.       Increase Strength and Stamina Yes  Intervention Provide advice, education, support and counseling about physical activity/exercise needs.;Develop an individualized exercise prescription for aerobic and resistive training based on initial evaluation findings, risk stratification, comorbidities and participant's personal goals.       Expected Outcomes Short Term: Increase workloads from initial exercise prescription for resistance, speed, and METs.;Short Term: Perform resistance training exercises routinely during rehab and add in resistance training at home;Long Term: Improve cardiorespiratory fitness, muscular endurance and strength as measured by increased METs and functional capacity ( )       Able to understand and use rate of perceived exertion (RPE) scale Yes       Intervention Provide education and explanation on how to use RPE scale       Expected Outcomes Short Term: Able to use RPE daily in rehab to express subjective intensity level;Long Term:  Able to use RPE to guide intensity level when exercising independently       Able to understand and use Dyspnea scale Yes       Intervention Provide education and explanation on how to use Dyspnea scale       Expected Outcomes Short Term: Able to use  Dyspnea scale daily in rehab to express subjective sense of shortness of breath during exertion;Long Term: Able to use Dyspnea scale to guide intensity level when exercising independently       Knowledge and understanding of Target Heart Rate Range (THRR) Yes       Intervention Provide education and explanation of THRR including how the numbers were predicted and where they are located for reference       Expected Outcomes Short Term: Able to state/look up THRR;Long Term: Able to use THRR to govern intensity when exercising independently;Short Term: Able to use daily as guideline for intensity in rehab       Able to check pulse independently Yes       Intervention Provide education and demonstration on how to check pulse in carotid and radial arteries.;Review the importance of being able to check your own pulse for safety during independent exercise       Expected Outcomes Short Term: Able to explain why pulse checking is important during independent exercise       Understanding of Exercise Prescription Yes       Intervention Provide education, explanation, and written materials on patient's individual exercise prescription       Expected Outcomes Short Term: Able to explain program exercise prescription;Long Term: Able to explain home exercise prescription to exercise independently          Exercise Goals Re-Evaluation :  Exercise Goals Re-Evaluation     Row Name 02/18/24 1554 03/04/24 1649 03/16/24 1452 04/01/24 1751 04/07/24 1550     Exercise Goal Re-Evaluation   Exercise Goals Review Increase Physical Activity;Able to understand and use rate of perceived exertion (RPE) scale;Knowledge and understanding of Target Heart Rate Range (THRR);Understanding of Exercise Prescription;Increase Strength and Stamina;Able to understand and use Dyspnea scale;Able to check pulse independently Increase Physical Activity;Increase Strength and Stamina;Understanding of Exercise Prescription Increase Physical  Activity;Increase Strength and Stamina;Understanding of Exercise Prescription Increase Physical Activity;Increase Strength and Stamina;Understanding of Exercise Prescription Increase Physical Activity;Able to understand and use rate of perceived exertion (RPE) scale;Knowledge and understanding of Target Heart Rate Range (THRR);Understanding of Exercise Prescription;Increase Strength and Stamina;Able to understand and use Dyspnea scale;Able to check pulse independently   Comments Reviewed RPE and dyspnea scale, THR and program prescription with pt today.  Pt voiced understanding and was given  a copy of goals to take home. Lillyian is off to a good start in the program. She was able to attend her first few sessions during this weeks review period. During these sessions she was able to increase from level 2 to 3 on the T4 nustep, and increase from level 2 to 4 on the XR. We will continue to monitor her progress in the program. Koya is doing well in the program and only attended 2 sessions in this review. She maintained level 4 on the XR and a speed of 1.5 mph on the treadmiil with no incline. She also added the biostep at level 1 to her exercise prescription. We will continue to monitor her progress in the program. Zuleika is doing well in the program and only attended 2 sessions in this review. She increased to level 4 on the T4 nustep. She also used the T6 nustep at level 3 and the treadmill at a speed of 1.3 mph with no incline. We will continue to monitor her progress in the program. Reviewed home exercise with pt today.  Pt plans to use hand weights and walk for exercise.  Reviewed THR, pulse, RPE, sign and symptoms, pulse oximetery and when to call 911 or MD.  Also discussed weather considerations and indoor options.  Pt voiced understanding.   Expected Outcomes Short: Use RPE daily to regulate intensity.  Long: Follow program prescription in THR. Short: Continue to follow exercise prescription. Long: Continue  exercise to improve strength and stamina. Short: Attend rehab more consistently. Long: Continue exercise to improve strength and stamina. Short: Attend rehab more consistently. Long: Continue exercise to improve strength and stamina. Short: add 1-2 days a week of exercise at home on off days of rehab. Long: Maintain independent exercise routine.    Row Name 04/14/24 1549 04/28/24 1454 05/11/24 1346         Exercise Goal Re-Evaluation   Exercise Goals Review Increase Physical Activity;Increase Strength and Stamina;Understanding of Exercise Prescription Increase Physical Activity;Increase Strength and Stamina;Understanding of Exercise Prescription Increase Physical Activity;Increase Strength and Stamina;Understanding of Exercise Prescription     Comments Merilee is doing well in rehab. She was able to increase her treadmill speed from 1.3mph to 1.4mph. She was also able to maintain level 4 on the XR. We will continue to monitor her progress in the program. Julea continues to do well in rehab. She was only able to attend one session during this review period. During the one session she was able to increase her speed on the treadmill from 1.4 to 1.5 mph. We will continue to monitor her progress in the program. Tyreisha continues to do well in rehab. She continues to walk on the treadmill at a speed of 1.4 mph with no incline. She also improved to level 2 on the biostep and continues to work at level 3 on the T6 nustep and level 4 on the XR. We will continue to monitor her progress in the program.     Expected Outcomes Short: Continue to increase treadmill workloads. Long: Continue exercise to improve strength and stamina. Short: Continue to increase treadmill workloads. Long: Continue exercise to improve strength and stamina. Short: Continue to progressively increase treadmill workload. Long: Continue exercise to improve strength and stamina.        Discharge Exercise Prescription (Final Exercise Prescription  Changes):  Exercise Prescription Changes - 05/11/24 1300       Response to Exercise   Blood Pressure (Admit) 132/76    Blood Pressure (  Exit) 106/64    Heart Rate (Admit) 91 bpm    Heart Rate (Exercise) 101 bpm    Heart Rate (Exit) 98 bpm    Oxygen  Saturation (Admit) 95 %    Oxygen  Saturation (Exercise) 94 %    Oxygen  Saturation (Exit) 96 %    Rating of Perceived Exertion (Exercise) 12    Perceived Dyspnea (Exercise) 3.5    Symptoms none    Duration Continue with 30 min of aerobic exercise without signs/symptoms of physical distress.    Intensity THRR unchanged      Progression   Progression Continue to progress workloads to maintain intensity without signs/symptoms of physical distress.    Average METs 2.29      Resistance Training   Training Prescription Yes    Weight 3 lb    Reps 10-15      Interval Training   Interval Training No      Oxygen    Oxygen  Continuous    Liters 2      Treadmill   MPH 1.4    Grade 0    Minutes 15    METs 2.07      REL-XR   Level 4    Minutes 15    METs 3.5      T5 Nustep   Level 3   T6 nustep   Minutes 15    METs 2      Biostep-RELP   Level 2    Minutes 15    METs 2      Home Exercise Plan   Plans to continue exercise at Home (comment)   weights and walking   Frequency Add 2 additional days to program exercise sessions.    Initial Home Exercises Provided 04/07/24      Oxygen    Maintain Oxygen  Saturation 88% or higher          Nutrition:  Target Goals: Understanding of nutrition guidelines, daily intake of sodium 1500mg , cholesterol 200mg , calories 30% from fat and 7% or less from saturated fats, daily to have 5 or more servings of fruits and vegetables.  Education: All About Nutrition: -Group instruction provided by verbal, written material, interactive activities, discussions, models, and posters to present general guidelines for heart healthy nutrition including fat, fiber, MyPlate, the role of sodium in heart  healthy nutrition, utilization of the nutrition label, and utilization of this knowledge for meal planning. Follow up email sent as well. Written material given at graduation. Flowsheet Row Pulmonary Rehab from 01/29/2023 in [facility] Cardiac and Pulmonary Rehab  Education need identified 11/18/22    Biometrics:  Pre Biometrics - 02/11/24 1535       Pre Biometrics   Height 5' 7.55 (1.716 m)    Weight 201 lb 11.2 oz (91.5 kg)    Waist Circumference 39.5 inches    Hip Circumference 43 inches    Waist to Hip Ratio 0.92 %    BMI (Calculated) 31.07    Single Leg Stand 7.34 seconds           Nutrition Therapy Plan and Nutrition Goals:   Nutrition Assessments:  MEDIFICTS Score Key: >=70 Need to make dietary changes  40-70 Heart Healthy Diet <= 40 Therapeutic Level Cholesterol Diet  Flowsheet Row Pulmonary Rehab from 02/13/2023 in [facility] Cardiac and Pulmonary Rehab  Picture Your Plate Total Score on Discharge 56   Picture Your Plate Scores: <59 Unhealthy dietary pattern with much room for improvement. 41-50 Dietary pattern unlikely to meet recommendations for good health  and room for improvement. 51-60 More healthful dietary pattern, with some room for improvement.  >60 Healthy dietary pattern, although there may be some specific behaviors that could be improved.   Nutrition Goals Re-Evaluation:  Nutrition Goals Re-Evaluation     Row Name 03/10/24 1535 04/01/24 1556           Goals   Nutrition Goal Adalynd has deferred to meet with the RD at this time. She said she may be open to meeting with him in the future. --      Comment -- Patient was informed on why it is important to maintain a balanced diet when dealing with Respiratory issues. Explained that it takes a lot of energy to breath and when they are short of breath often they will need to have a good diet to help keep up with the calories they are expending for breathing.      Expected Outcome -- Short: Choose and plan snacks  accordingly to patients caloric intake to improve breathing. Long: Maintain a diet independently that meets their caloric intake to aid in daily shortness of breath.         Nutrition Goals Discharge (Final Nutrition Goals Re-Evaluation):  Nutrition Goals Re-Evaluation - 04/01/24 1556       Goals   Comment Patient was informed on why it is important to maintain a balanced diet when dealing with Respiratory issues. Explained that it takes a lot of energy to breath and when they are short of breath often they will need to have a good diet to help keep up with the calories they are expending for breathing.    Expected Outcome Short: Choose and plan snacks accordingly to patients caloric intake to improve breathing. Long: Maintain a diet independently that meets their caloric intake to aid in daily shortness of breath.          Psychosocial: Target Goals: Acknowledge presence or absence of significant depression and/or stress, maximize coping skills, provide positive support system. Participant is able to verbalize types and ability to use techniques and skills needed for reducing stress and depression.   Education: Stress, Anxiety, and Depression - Group verbal and visual presentation to define topics covered.  Reviews how body is impacted by stress, anxiety, and depression.  Also discusses healthy ways to reduce stress and to treat/manage anxiety and depression.  Written material given at graduation. Flowsheet Row Pulmonary Rehab from 01/29/2023 in [facility] Cardiac and Pulmonary Rehab  Date 01/22/23  Educator KW  Instruction Review Code 1- [employer] Understanding    Education: Sleep Hygiene -Provides group verbal and written instruction about how sleep can affect your health.  Define sleep hygiene, discuss sleep cycles and impact of sleep habits. Review good sleep hygiene tips.    Initial Review & Psychosocial Screening:  Initial Psych Review & Screening - 12/24/23 1343       Initial  Review   Current issues with None Identified      Family Dynamics   Good Support System? Yes    Comments She can look look to her sister for support. She lives alone and feels like she has no mental instability.      Barriers   Psychosocial barriers to participate in program The patient should benefit from training in stress management and relaxation.;There are no identifiable barriers or psychosocial needs.      Screening Interventions   Interventions Encouraged to exercise;Provide feedback about the scores to participant;Program counselor consult;To provide support and resources with identified psychosocial  needs    Expected Outcomes Short Term goal: Utilizing psychosocial counselor, staff and physician to assist with identification of specific Stressors or current issues interfering with healing process. Setting desired goal for each stressor or current issue identified.;Long Term Goal: Stressors or current issues are controlled or eliminated.;Short Term goal: Identification and review with participant of any Quality of Life or Depression concerns found by scoring the questionnaire.;Long Term goal: The participant improves quality of Life and PHQ9 Scores as seen by post scores and/or verbalization of changes          Quality of Life Scores:  Scores of 19 and below usually indicate a poorer quality of life in these areas.  A difference of  2-3 points is a clinically meaningful difference.  A difference of 2-3 points in the total score of the Quality of Life Index has been associated with significant improvement in overall quality of life, self-image, physical symptoms, and general health in studies assessing change in quality of life.  PHQ-9: Review Flowsheet  More data exists      02/11/2024 08/20/2023 02/13/2023 01/28/2023 11/18/2022  Depression screen PHQ 2/9  Decreased Interest 0 0 1 0 1  Down, Depressed, Hopeless 0 0 0 0 0  PHQ - 2 Score 0 0 1 0 1  Altered sleeping 0 1 0 0 0  Tired,  decreased energy 0 1 3 3 3   Change in appetite 0 0 0 0 0  Feeling bad or failure about yourself  0 0 0 0 0  Trouble concentrating 0 0 0 0 0  Moving slowly or fidgety/restless 0 0 0 0 0  Suicidal thoughts 0 0 0 0 0  PHQ-9 Score 0 2 4 3 4   Difficult doing work/chores - - Somewhat difficult Not difficult at all -   Interpretation of Total Score  Total Score Depression Severity:  1-4 = Minimal depression, 5-9 = Mild depression, 10-14 = Moderate depression, 15-19 = Moderately severe depression, 20-27 = Severe depression   Psychosocial Evaluation and Intervention:  Psychosocial Evaluation - 12/24/23 1344       Psychosocial Evaluation & Interventions   Interventions Encouraged to exercise with the program and follow exercise prescription    Comments She can look look to her sister for support. She lives alone and feels like she has no mental instability.    Expected Outcomes Short: attend pulmonary rehab for education and exercise. Long: develop and maintain positive self care habits.    Continue Psychosocial Services  Follow up required by staff          Psychosocial Re-Evaluation:  Psychosocial Re-Evaluation     Row Name 03/10/24 1536 04/01/24 1557           Psychosocial Re-Evaluation   Current issues with Current Stress Concerns;Current Sleep Concerns Current Stress Concerns;Current Sleep Concerns      Comments Elizah reports no major stressors at this time. Aivah states that she is frustrated with her breathing as it has restricted her from doing the things she enjoys. She states that se struggles with staying asleep at this time. She has been taking tylenol  PM, but reports it only helps her sleep sometimes. Patient reports no issues with their current mental states, sleep, stress, depression or anxiety. Will follow up with patient in a few weeks for any changes.      Expected Outcomes Short: Continue to attend rehab for exercise and mental boost. Long: Maintain positive outlook.  Short: Continue to exercise regularly to support mental health  and notify staff of any changes. Long: maintain mental health and well being through teaching of rehab or prescribed medications independently.      Interventions Encouraged to attend Pulmonary Rehabilitation for the exercise Encouraged to attend Pulmonary Rehabilitation for the exercise      Continue Psychosocial Services  No Follow up required Follow up required by staff        Initial Review   Source of Stress Concerns Chronic Illness --      Comments Her shortness of breath is her main stressor --         Psychosocial Discharge (Final Psychosocial Re-Evaluation):  Psychosocial Re-Evaluation - 04/01/24 1557       Psychosocial Re-Evaluation   Current issues with Current Stress Concerns;Current Sleep Concerns    Comments Patient reports no issues with their current mental states, sleep, stress, depression or anxiety. Will follow up with patient in a few weeks for any changes.    Expected Outcomes Short: Continue to exercise regularly to support mental health and notify staff of any changes. Long: maintain mental health and well being through teaching of rehab or prescribed medications independently.    Interventions Encouraged to attend Pulmonary Rehabilitation for the exercise    Continue Psychosocial Services  Follow up required by staff          Education: Education Goals: Education classes will be provided on a weekly basis, covering required topics. Participant will state understanding/return demonstration of topics presented.  Learning Barriers/Preferences:   General Pulmonary Education Topics:  Infection Prevention: - Provides verbal and written material to individual with discussion of infection control including proper hand washing and proper equipment cleaning during exercise session. Flowsheet Row Pulmonary Rehab from 02/11/2024 in [facility] Cardiac and Pulmonary Rehab  Date 02/11/24  Educator [facility]  Instruction  Review Code 1- Verbalizes Understanding    Falls Prevention: - Provides verbal and written material to individual with discussion of falls prevention and safety. Flowsheet Row Pulmonary Rehab from 02/11/2024 in [facility] Cardiac and Pulmonary Rehab  Date 02/11/24  Educator [facility]  Instruction Review Code 1- Verbalizes Understanding    Chronic Lung Disease Review: - Group verbal instruction with posters, models, PowerPoint presentations and videos,  to review new updates, new respiratory medications, new advancements in procedures and treatments. Providing information on websites and 800 numbers for continued self-education. Includes information about supplement oxygen , available portable oxygen  systems, continuous and intermittent flow rates, oxygen  safety, concentrators, and Medicare reimbursement for oxygen . Explanation of Pulmonary Drugs, including class, frequency, complications, importance of spacers, rinsing mouth after steroid MDI's, and proper cleaning methods for nebulizers. Review of basic lung anatomy and physiology related to function, structure, and complications of lung disease. Review of risk factors. Discussion about methods for diagnosing sleep apnea and types of masks and machines for OSA. Includes a review of the use of types of environmental controls: home humidity, furnaces, filters, dust mite/pet prevention, HEPA vacuums. Discussion about weather changes, air quality and the benefits of nasal washing. Instruction on Warning signs, infection symptoms, calling MD promptly, preventive modes, and value of vaccinations. Review of effective airway clearance, coughing and/or vibration techniques. Emphasizing that all should Create an Action Plan. Written material given at graduation. Flowsheet Row Pulmonary Rehab from 01/29/2023 in [facility] Cardiac and Pulmonary Rehab  Education need identified 11/18/22  Date 01/15/23  Educator [facility]  Instruction Review Code 1- Verbalizes Understanding     AED/CPR: - Group verbal and written instruction with the use of models to demonstrate the basic use  of the AED with the basic ABC's of resuscitation.    Anatomy and Cardiac Procedures: - Group verbal and visual presentation and models provide information about basic cardiac anatomy and function. Reviews the testing methods done to diagnose heart disease and the outcomes of the test results. Describes the treatment choices: Medical Management, Angioplasty, or Coronary Bypass Surgery for treating various heart conditions including Myocardial Infarction, Angina, Valve Disease, and Cardiac Arrhythmias.  Written material given at graduation. Flowsheet Row Pulmonary Rehab from 01/29/2023 in [facility] Cardiac and Pulmonary Rehab  Date 12/18/22  Educator SB  Instruction Review Code 1- Verbalizes Understanding    Medication Safety: - Group verbal and visual instruction to review commonly prescribed medications for heart and lung disease. Reviews the medication, class of the drug, and side effects. Includes the steps to properly store meds and maintain the prescription regimen.  Written material given at graduation.   Other: -Provides group and verbal instruction on various topics (see comments) Flowsheet Row Pulmonary Rehab from 01/29/2023 in [facility] Cardiac and Pulmonary Rehab  Date 12/25/22  Educator SB  Instruction Review Code 1- Verbalizes Understanding  [Jeopardy]    Knowledge Questionnaire Score:    Core Components/Risk Factors/Patient Goals at Admission:  Personal Goals and Risk Factors at Admission - 12/24/23 1342       Core Components/Risk Factors/Patient Goals on Admission    Weight Management Yes;Weight Loss    Intervention Weight Management: Develop a combined nutrition and exercise program designed to reach desired caloric intake, while maintaining appropriate intake of nutrient and fiber, sodium and fats, and appropriate energy expenditure required for the weight goal.;Weight  Management: Provide education and appropriate resources to help participant work on and attain dietary goals.;Weight Management/Obesity: Establish reasonable short term and long term weight goals.;Obesity: Provide education and appropriate resources to help participant work on and attain dietary goals.    Expected Outcomes Short Term: Continue to assess and modify interventions until short term weight is achieved;Long Term: Adherence to nutrition and physical activity/exercise program aimed toward attainment of established weight goal;Weight Loss: Understanding of general recommendations for a balanced deficit meal plan, which promotes 1-2 lb weight loss per week and includes a negative energy balance of [phone] kcal/d;Understanding recommendations for meals to include 15-35% energy as protein, 25-35% energy from fat, 35-60% energy from carbohydrates, less than 200mg  of dietary cholesterol, 20-35 gm of total fiber daily;Understanding of distribution of calorie intake throughout the day with the consumption of 4-5 meals/snacks    Improve shortness of breath with ADL's Yes    Intervention Provide education, individualized exercise plan and daily activity instruction to help decrease symptoms of SOB with activities of daily living.    Expected Outcomes Short Term: Improve cardiorespiratory fitness to achieve a reduction of symptoms when performing ADLs;Long Term: Be able to perform more ADLs without symptoms or delay the onset of symptoms    Hypertension Yes    Intervention Provide education on lifestyle modifcations including regular physical activity/exercise, weight management, moderate sodium restriction and increased consumption of fresh fruit, vegetables, and low fat dairy, alcohol moderation, and smoking cessation.;Monitor prescription use compliance.    Expected Outcomes Short Term: Continued assessment and intervention until BP is < 140/23mm HG in hypertensive participants. < 130/70mm HG in  hypertensive participants with diabetes, heart failure or chronic kidney disease.;Long Term: Maintenance of blood pressure at goal levels.          Education:Diabetes - Individual verbal and written instruction to review signs/symptoms of diabetes, desired ranges of glucose  level fasting, after meals and with exercise. Acknowledge that pre and post exercise glucose checks will be done for 3 sessions at entry of program.   Know Your Numbers and Heart Failure: - Group verbal and visual instruction to discuss disease risk factors for cardiac and pulmonary disease and treatment options.  Reviews associated critical values for Overweight/Obesity, Hypertension, Cholesterol, and Diabetes.  Discusses basics of heart failure: signs/symptoms and treatments.  Introduces Heart Failure Zone chart for action plan for heart failure.  Written material given at graduation.   Core Components/Risk Factors/Patient Goals Review:   Goals and Risk Factor Review     Row Name 03/10/24 1541 04/01/24 1556           Core Components/Risk Factors/Patient Goals Review   Personal Goals Review Weight Management/Obesity;Improve shortness of breath with ADL's Improve shortness of breath with ADL's      Review Talayeh states that she would still like to lose some weight. She would like to get down to 150 lbs but is restricted by her breathing and is not able to exercise as much as she feels she needs to. She is continuing to practice PLB but states she still feels SOB. She reports still feeling SOB even when wearing her oxygen , although her O2 saturations have stayed above 90%. Spoke to patient about their shortness of breath and what they can do to improve. Patient has been informed of breathing techniques when starting the program. Patient is informed to tell staff if they have had any med changes and that certain meds they are taking or not taking can be causing shortness of breath.      Expected Outcomes Short: Continue to  practice breathing techniques to improve SOB. Long: Continue to manage lifestyle risk factors. Short: Attend [facility] regularly to improve shortness of breath with ADL's. Long: maintain independence with ADL's         Core Components/Risk Factors/Patient Goals at Discharge (Final Review):   Goals and Risk Factor Review - 04/01/24 1556       Core Components/Risk Factors/Patient Goals Review   Personal Goals Review Improve shortness of breath with ADL's    Review Spoke to patient about their shortness of breath and what they can do to improve. Patient has been informed of breathing techniques when starting the program. Patient is informed to tell staff if they have had any med changes and that certain meds they are taking or not taking can be causing shortness of breath.    Expected Outcomes Short: Attend [facility] regularly to improve shortness of breath with ADL's. Long: maintain independence with ADL's          ITP Comments:  ITP Comments     Row Name 12/24/23 1350 02/11/24 1527 02/18/24 1125 02/18/24 1553 03/17/24 1301   ITP Comments Virtual Visit completed. Patient informed on EP and RD appointment and 6 Minute walk test. Patient also informed of patient health questionnaires on My Chart. Patient Verbalizes understanding. Visit diagnosis can be found in [facility] 12/08/[facility]23. Completed and gym orientation. Initial ITP created and sent for review to Dr. Faud Aleskerov, Medical Director. 30 Day review completed. Medical Director ITP review done, changes made as directed, and signed approval by Medical Director.    new to program First full day of exercise!  Patient was oriented to gym and equipment including functions, settings, policies, and procedures.  Patient's individual exercise prescription and treatment plan were reviewed.  All starting workloads were established based on the results of the  6 minute walk test done at initial orientation visit.  The plan for exercise progression was also  introduced and progression will be customized based on patient's performance and goals. 30 Day review completed. Medical Director ITP review done, changes made as directed, and signed approval by Medical Director. New to program.    Row Name 04/14/24 1051 05/12/24 1057         ITP Comments 30 Day review completed. Medical Director ITP review done, changes made as directed, and signed approval by Medical Director. 30 Day review completed. Medical Director ITP review done, changes made as directed, and signed approval by Medical Director.         Comments: 30 day review

## 2024-05-13 ENCOUNTER — Encounter: Attending: Specialist | Admitting: *Deleted

## 2024-05-13 DIAGNOSIS — J449 Chronic obstructive pulmonary disease, unspecified: Secondary | ICD-10-CM | POA: Diagnosis present

## 2024-05-13 NOTE — Progress Notes (Signed)
 Daily Session Note  Patient Details  Name: Nancy [name] MRN: [id number] Date of Birth: [birth year] Referring Provider:   Flowsheet Row Pulmonary Rehab from 02/11/2024 in [facility] Cardiac and Pulmonary Rehab  Referring Provider Dr. Lavelle Servant    Encounter Date: 05/13/2024  Check In:  Session Check In - 05/13/24 1521       Check-In   Supervising physician immediately available to respond to emergencies See telemetry face sheet for immediately available ER MD    Location [facility]-Cardiac & Pulmonary Rehab    Staff Present Hoy Rodney RN,BSN;Joseph Rolinda NORWOOD HARMAN SAMMIE;Othel Durand, RN, BSN, CCRP;Noah Tickle, BS, Exercise Physiologist    Virtual Visit No    Medication changes reported     No    Fall or balance concerns reported    No    Warm-up and Cool-down Performed on first and last piece of equipment    Resistance Training Performed Yes    VAD Patient? No    PAD/SET Patient? No      Pain Assessment   Currently in Pain? No/denies             Social History   Tobacco Use  Smoking Status Former   Current packs/day: 0.00   Average packs/day: 0.5 packs/day for 30.0 years (15.0 ttl pk-yrs)   Types: Cigarettes   Start date: 03/11/1986   Quit date: 03/11/2016   Years since quitting: 8.1  Smokeless Tobacco Never  Tobacco Comments   patient states  that she is working at her own pace to quit smoking    Goals Met:  Independence with exercise equipment Exercise tolerated well No report of concerns or symptoms today Strength training completed today  Goals Unmet:  Not Applicable  Comments: Pt able to follow exercise prescription today without complaint.  Will continue to monitor for progression.    Dr. Oneil Pinal is Medical Director for [facility] Cardiac Rehabilitation.  Dr. Fuad Aleskerov is Medical Director for [facility] Pulmonary Rehabilitation.

## 2024-05-19 ENCOUNTER — Encounter: Admitting: *Deleted

## 2024-05-19 DIAGNOSIS — J449 Chronic obstructive pulmonary disease, unspecified: Secondary | ICD-10-CM | POA: Diagnosis not present

## 2024-05-19 NOTE — Progress Notes (Signed)
 Daily Session Note  Patient Details  Name: Nancy [name] MRN: [id number] Date of Birth: 1961/03/30 Referring Provider:   Flowsheet Row Pulmonary Rehab from 02/11/2024 in [facility] Cardiac and Pulmonary Rehab  Referring Provider Dr. Lavelle Servant    Encounter Date: 05/19/2024  Check In:  Session Check In - 05/19/24 1548       Check-In   Supervising physician immediately available to respond to emergencies See telemetry face sheet for immediately available ER MD    Location [facility]-Cardiac & Pulmonary Rehab    Staff Present Fairy Plater RCP,RRT,BSRT;Favio Moder Tressa RN,BSN;Kelly [facility]    Virtual Visit No    Medication changes reported     No    Warm-up and Cool-down Performed on first and last piece of equipment    Resistance Training Performed Yes    VAD Patient? No    PAD/SET Patient? No      Pain Assessment   Currently in Pain? No/denies             Social History   Tobacco Use  Smoking Status Former   Current packs/day: 0.00   Average packs/day: 0.5 packs/day for 30.0 years (15.0 ttl pk-yrs)   Types: Cigarettes   Start date: 03/11/1986   Quit date: 03/11/2016   Years since quitting: 8.1  Smokeless Tobacco Never  Tobacco Comments   patient states  that she is working at her own pace to quit smoking    Goals Met:  Independence with exercise equipment Exercise tolerated well No report of concerns or symptoms today Strength training completed today  Goals Unmet:  Not Applicable  Comments: Pt able to follow exercise prescription today without complaint.  Will continue to monitor for progression.    Dr. Oneil Pinal is Medical Director for [facility] Cardiac Rehabilitation.  Dr. Fuad Aleskerov is Medical Director for [facility] Pulmonary Rehabilitation.

## 2024-05-20 ENCOUNTER — Encounter

## 2024-05-26 ENCOUNTER — Encounter

## 2024-05-27 ENCOUNTER — Encounter: Admitting: *Deleted

## 2024-05-27 DIAGNOSIS — J449 Chronic obstructive pulmonary disease, unspecified: Secondary | ICD-10-CM

## 2024-05-27 NOTE — Progress Notes (Signed)
 Daily Session Note  Patient Details  Name: Nancy [name] MRN: [id number] Date of Birth: 1961-06-09 Referring Provider:   Flowsheet Row Pulmonary Rehab from 02/11/2024 in [facility] Cardiac and Pulmonary Rehab  Referring Provider Dr. Lavelle Servant    Encounter Date: 05/27/2024  Check In:  Session Check In - 05/27/24 1552       Check-In   Supervising physician immediately available to respond to emergencies See telemetry face sheet for immediately available ER MD    Location [facility]-Cardiac & Pulmonary Rehab    Staff Present Hoy Rodney RN,BSN;Joseph Rolinda RCP,RRT,BSRT;Kristen Coble RN,BC,MSN;Maxon Conetta BS, Exercise Physiologist    Virtual Visit No    Medication changes reported     No    Fall or balance concerns reported    No    Warm-up and Cool-down Performed on first and last piece of equipment    Resistance Training Performed Yes    VAD Patient? No    PAD/SET Patient? No      Pain Assessment   Currently in Pain? No/denies             Social History   Tobacco Use  Smoking Status Former   Current packs/day: 0.00   Average packs/day: 0.5 packs/day for 30.0 years (15.0 ttl pk-yrs)   Types: Cigarettes   Start date: 03/11/1986   Quit date: 03/11/2016   Years since quitting: 8.2  Smokeless Tobacco Never  Tobacco Comments   patient states  that she is working at her own pace to quit smoking    Goals Met:  Independence with exercise equipment Exercise tolerated well No report of concerns or symptoms today Strength training completed today  Goals Unmet:  Not Applicable  Comments: Pt able to follow exercise prescription today without complaint.  Will continue to monitor for progression.    Dr. Oneil Pinal is Medical Director for [facility] Cardiac Rehabilitation.  Dr. Fuad Aleskerov is Medical Director for [facility] Pulmonary Rehabilitation.

## 2024-06-02 ENCOUNTER — Encounter

## 2024-06-03 ENCOUNTER — Encounter

## 2024-06-03 DIAGNOSIS — J449 Chronic obstructive pulmonary disease, unspecified: Secondary | ICD-10-CM | POA: Diagnosis not present

## 2024-06-03 NOTE — Progress Notes (Signed)
 Daily Session Note  Patient Details  Name: Nancy [name] MRN: [id number] Date of Birth: 1961-01-28 Referring Provider:   Flowsheet Row Pulmonary Rehab from 02/11/2024 in [facility] Cardiac and Pulmonary Rehab  Referring Provider Dr. Lavelle Servant    Encounter Date: 06/03/2024  Check In:  Session Check In - 06/03/24 1541       Check-In   Supervising physician immediately available to respond to emergencies See telemetry face sheet for immediately available ER MD    Location [facility]-Cardiac & Pulmonary Rehab    Staff Present Josette Shallow RN,BC,MSN;Maxon Burnell HECKLE, Exercise Physiologist;Meredith Tressa RN,BSN;Noah Tickle, BS, Exercise Physiologist    Virtual Visit No    Medication changes reported     No    Fall or balance concerns reported    No    Tobacco Cessation --    Warm-up and Cool-down Performed on first and last piece of equipment    Resistance Training Performed Yes    VAD Patient? No    PAD/SET Patient? No      Pain Assessment   Currently in Pain? No/denies    Multiple Pain Sites No             Social History   Tobacco Use  Smoking Status Former   Current packs/day: 0.00   Average packs/day: 0.5 packs/day for 30.0 years (15.0 ttl pk-yrs)   Types: Cigarettes   Start date: 03/11/1986   Quit date: 03/11/2016   Years since quitting: 8.2  Smokeless Tobacco Never  Tobacco Comments   patient states  that she is working at her own pace to quit smoking    Goals Met:  Independence with exercise equipment Exercise tolerated well No report of concerns or symptoms today Strength training completed today  Goals Unmet:  Not Applicable  Comments: Pt able to follow exercise prescription today without complaint.  Will continue to monitor for progression.    Dr. Oneil Pinal is Medical Director for [facility] Cardiac Rehabilitation.  Dr. Fuad Aleskerov is Medical Director for [facility] Pulmonary Rehabilitation.

## 2024-06-09 ENCOUNTER — Encounter: Admitting: *Deleted

## 2024-06-09 DIAGNOSIS — J449 Chronic obstructive pulmonary disease, unspecified: Secondary | ICD-10-CM

## 2024-06-09 NOTE — Progress Notes (Signed)
 Daily Session Note  Patient Details  Name: PATSYE SULLIVANT MRN: [id number] Date of Birth: [birth year] Referring Provider:   Flowsheet Row Pulmonary Rehab from 02/11/2024 in [facility] Cardiac and Pulmonary Rehab  Referring Provider Dr. Lavelle Servant    Encounter Date: 06/09/2024  Check In:  Session Check In - 06/09/24 1615       Check-In   Supervising physician immediately available to respond to emergencies See telemetry face sheet for immediately available ER MD    Location [facility]-Cardiac & Pulmonary Rehab    Staff Present Hoy Rodney RN,BSN;Joseph [facility] Dyane BS, ACSM CEP, Exercise Physiologist;Kelly [facility]    Virtual Visit No    Medication changes reported     No    Fall or balance concerns reported    No    Warm-up and Cool-down Performed on first and last piece of equipment    Resistance Training Performed Yes    VAD Patient? No    PAD/SET Patient? No      Pain Assessment   Currently in Pain? No/denies             Social History   Tobacco Use  Smoking Status Former   Current packs/day: 0.00   Average packs/day: 0.5 packs/day for 30.0 years (15.0 ttl pk-yrs)   Types: Cigarettes   Start date: 03/11/1986   Quit date: 03/11/2016   Years since quitting: 8.2  Smokeless Tobacco Never  Tobacco Comments   patient states  that she is working at her own pace to quit smoking    Goals Met:  Independence with exercise equipment Exercise tolerated well No report of concerns or symptoms today Strength training completed today  Goals Unmet:  Not Applicable  Comments: Pt able to follow exercise prescription today without complaint.  Will continue to monitor for progression.    Dr. Oneil Pinal is Medical Director for [facility] Cardiac Rehabilitation.  Dr. Fuad Aleskerov is Medical Director for [facility] Pulmonary Rehabilitation.

## 2024-06-09 NOTE — Progress Notes (Signed)
 Pulmonary Individual Treatment Plan  Patient Details  Name: Nancy [name] MRN: [id number] Date of Birth: 1961-01-14 Referring Provider:   Flowsheet Row Pulmonary Rehab from 02/11/2024 in [facility] Cardiac and Pulmonary Rehab  Referring Provider Dr. Lavelle Servant    Initial Encounter Date:  Flowsheet Row Pulmonary Rehab from 02/11/2024 in [facility] Cardiac and Pulmonary Rehab  Date 02/11/24    Visit Diagnosis: Stage 3 severe COPD by GOLD classification (HCC)  Patient's Home Medications on Admission:  Current Outpatient Medications:    acetaminophen  (TYLENOL ) 500 MG tablet, Take 500 mg by mouth every 4 (four) hours as needed., Disp: , Rfl:    albuterol  (VENTOLIN  HFA) 108 (90 Base) MCG/ACT inhaler, SMARTSIG:2 Inhalation Via Inhaler Every 6 Hours PRN, Disp: , Rfl:    amLODipine  (NORVASC ) 5 MG tablet, Take 5 mg by mouth daily., Disp: , Rfl:    benzonatate  (TESSALON ) 200 MG capsule, Take 1 capsule (200 mg total) by mouth 2 (two) times daily as needed for cough., Disp: 20 capsule, Rfl: 0   diclofenac  Sodium (VOLTAREN ) 1 % GEL, Apply 2 g topically 4 (four) times daily., Disp: 100 g, Rfl: 0   doxycycline  (PERIOSTAT ) 20 MG tablet, Take one tab po BID with food., Disp: 60 tablet, Rfl: 11   DULoxetine  (CYMBALTA ) 20 MG capsule, Take 2 capsules (40 mg total) by mouth 2 (two) times daily., Disp: 360 capsule, Rfl: 1   enoxaparin  (LOVENOX ) 100 MG/ML injection, Inject into the skin. (Patient not taking: Reported on 12/24/2023), Disp: , Rfl:    enoxaparin  (LOVENOX ) 40 MG/0.4ML injection, Inject 90 mg into the skin daily. (Patient not taking: Reported on 12/24/2023), Disp: , Rfl:    Fluticasone -Umeclidin-Vilant (TRELEGY ELLIPTA ) 100-62.5-25 MCG/ACT AEPB, Inhale into the lungs., Disp: , Rfl:    furosemide  (LASIX ) 40 MG tablet, Take 40 mg by mouth 2 (two) times daily. (Patient not taking: Reported on 12/24/2023), Disp: , Rfl:    furosemide  (LASIX ) 40 MG tablet, Take 1 tablet by mouth 2 (two) times daily., Disp: , Rfl:     ipratropium-albuterol  (DUONEB) 0.5-2.5 (3) MG/3ML SOLN, Inhale into the lungs., Disp: , Rfl:    levofloxacin  (LEVAQUIN ) 750 MG tablet, Take 1 tablet (750 mg total) by mouth daily., Disp: 7 tablet, Rfl: 0   levothyroxine  (SYNTHROID ) 125 MCG tablet, Take 1 tablet (125 mcg total) by mouth daily before breakfast., Disp: 90 tablet, Rfl: 3   lidocaine  (LIDODERM ) 5 %, Place 1 patch onto the skin every 12 (twelve) hours. Remove & Discard patch within 12 hours or as directed by MD, Disp: 15 patch, Rfl: 0   metoprolol  succinate (TOPROL -XL) 25 MG 24 hr tablet, Take 25 mg by mouth daily., Disp: , Rfl:    Na Sulfate-K Sulfate-Mg Sulfate concentrate 17.5-3.13-1.6 GM/177ML SOLN, , Disp: , Rfl:    ondansetron  (ZOFRAN  ODT) 4 MG disintegrating tablet, Take 1 tablet (4 mg total) by mouth every 8 (eight) hours as needed for nausea or vomiting., Disp: 20 tablet, Rfl: 2   oxyCODONE  (OXY IR/ROXICODONE ) 5 MG immediate release tablet, Take 1 tablet (5 mg total) by mouth every 6 (six) hours as needed for moderate pain., Disp: 30 tablet, Rfl: 0   polyethylene glycol (MIRALAX  / GLYCOLAX ) 17 g packet, Take 17 g by mouth daily as needed., Disp: , Rfl:    senna-docusate (SENOKOT-S) 8.6-50 MG tablet, Take 1 tablet by mouth 2 (two) times daily as needed for mild constipation., Disp: 20 tablet, Rfl: 0   TRELEGY ELLIPTA  100-62.5-25 MCG/ACT AEPB, Inhale into the lungs., Disp: ,  Rfl:    valACYclovir  (VALTREX ) 1000 MG tablet, Take 1 tablet (1,000 mg total) by mouth daily., Disp: 6 tablet, Rfl: 0   warfarin (COUMADIN ) 5 MG tablet, Take 5 mg by mouth every evening. Per cardiologist: patient supposed to take 7.5 mg 9/24 and 9/25 evening for low INR then resume 5 mg nightly  Goal INR 2.5-3.5, Disp: , Rfl:   Current Facility-Administered Medications:    ipratropium-albuterol  (DUONEB) 0.5-2.5 (3) MG/3ML nebulizer solution 3 mL, 3 mL, Nebulization, Once, Pollak, Adriana M, PA-C  Past Medical History: Past Medical History:  Diagnosis Date    Aortic dissection (HCC) 2010   COPD (chronic obstructive pulmonary disease) (HCC)    Depression    Hypertension    Hypothyroidism    Pneumonia    PONV (postoperative nausea and vomiting)    Rosacea    Seasonal allergies    Sleep apnea    Does not use CPAP    Tobacco Use: Social History   Tobacco Use  Smoking Status Former   Current packs/day: 0.00   Average packs/day: 0.5 packs/day for 30.0 years (15.0 ttl pk-yrs)   Types: Cigarettes   Start date: 03/11/1986   Quit date: 03/11/2016   Years since quitting: 8.2  Smokeless Tobacco Never  Tobacco Comments   patient states  that she is working at her own pace to quit smoking    Labs: Review Flowsheet  More data may exist      Latest Ref Rng & Units 07/02/2009 08/29/2015 01/05/2018 09/29/2020 08/20/2023  Labs for ITP Cardiac and Pulmonary Rehab  Cholestrol 100 - 199 mg/dL - 823  814  832  837   LDL (calc) 0 - 99 mg/dL - 896  888  898  894   HDL-C >39 mg/dL - 53  50  45  38   Trlycerides 0 - 149 mg/dL - 897  880  883  894   Hemoglobin A1c 4.8 - 5.6 % - - - - 4.8   TCO2 0 - 100 mmol/L 22  - - - -     Pulmonary Assessment Scores:  Pulmonary Assessment Scores     Row Name 02/11/24 1537         mMRC Score   mMRC Score 3        UCSD: Self-administered rating of dyspnea associated with activities of daily living (ADLs) 6-point scale (0 = not at all to 5 = maximal or unable to do because of breathlessness)  Scoring Scores range from 0 to 120.  Minimally important difference is 5 units  CAT: CAT can identify the health impairment of COPD patients and is better correlated with disease progression.  CAT has a scoring range of zero to 40. The CAT score is classified into four groups of low (less than 10), medium (10 - 20), high (21-30) and very high (31-40) based on the impact level of disease on health status. A CAT score over 10 suggests significant symptoms.  A worsening CAT score could be explained by an exacerbation,  poor medication adherence, poor inhaler technique, or progression of COPD or comorbid conditions.  CAT MCID is 2 points  mMRC: mMRC (Modified Medical Research Council) Dyspnea Scale is used to assess the degree of baseline functional disability in patients of respiratory disease due to dyspnea. No minimal important difference is established. A decrease in score of 1 point or greater is considered a positive change.   Pulmonary Function Assessment:  Pulmonary Function Assessment - 12/24/23 1342  Breath   Shortness of Breath Yes;Limiting activity          Exercise Target Goals: Exercise Program Goal: Individual exercise prescription set using results from initial 6 min walk test and THRR while considering  patient's activity barriers and safety.   Exercise Prescription Goal: Initial exercise prescription builds to 30-45 minutes a day of aerobic activity, 2-3 days per week.  Home exercise guidelines will be given to patient during program as part of exercise prescription that the participant will acknowledge.  Education: Aerobic Exercise: - Group verbal and visual presentation on the components of exercise prescription. Introduces F.I.T.T principle from ACSM for exercise prescriptions.  Reviews F.I.T.T. principles of aerobic exercise including progression. Written material given at graduation. Flowsheet Row Pulmonary Rehab from 01/29/2023 in [facility] Cardiac and Pulmonary Rehab  Date 11/27/22  Educator KW  Instruction Review Code 1- [employer] Understanding    Education: Resistance Exercise: - Group verbal and visual presentation on the components of exercise prescription. Introduces F.I.T.T principle from ACSM for exercise prescriptions  Reviews F.I.T.T. principles of resistance exercise including progression. Written material given at graduation. Flowsheet Row Pulmonary Rehab from 01/29/2023 in [facility] Cardiac and Pulmonary Rehab  Date 12/04/22  Educator KW  Instruction Review Code  1- [employer] Understanding     Education: Exercise & Equipment Safety: - Individual verbal instruction and demonstration of equipment use and safety with use of the equipment. Flowsheet Row Pulmonary Rehab from 02/11/2024 in [facility] Cardiac and Pulmonary Rehab  Date 02/11/24  Educator [facility]  Instruction Review Code 1- Verbalizes Understanding    Education: Exercise Physiology & General Exercise Guidelines: - Group verbal and written instruction with models to review the exercise physiology of the cardiovascular system and associated critical values. Provides general exercise guidelines with specific guidelines to those with heart or lung disease.    Education: Flexibility, Balance, Mind/Body Relaxation: - Group verbal and visual presentation with interactive activity on the components of exercise prescription. Introduces F.I.T.T principle from ACSM for exercise prescriptions. Reviews F.I.T.T. principles of flexibility and balance exercise training including progression. Also discusses the mind body connection.  Reviews various relaxation techniques to help reduce and manage stress (i.e. Deep breathing, progressive muscle relaxation, and visualization). Balance handout provided to take home. Written material given at graduation. Flowsheet Row Pulmonary Rehab from 01/29/2023 in [facility] Cardiac and Pulmonary Rehab  Date 12/11/22  Educator KW  Instruction Review Code 1- Verbalizes Understanding    Activity Barriers & Risk Stratification:  Activity Barriers & Cardiac Risk Stratification - 02/11/24 1530       Activity Barriers & Cardiac Risk Stratification   Activity Barriers None          6 Minute Walk:  6 Minute Walk     Row Name 02/11/24 1528         6 Minute Walk   Phase Initial     Distance 755 feet     Walk Time 5.17 minutes     # of Rest Breaks 2     MPH 1.66     METS 2.7     RPE 12     Perceived Dyspnea  4     VO2 Peak 9.56     Symptoms Yes (comment)     Comments SOB      Resting HR 98 bpm     Resting BP 122/58     Resting Oxygen  Saturation  97 %     Exercise Oxygen  Saturation  during 6 min walk 95 %  Max Ex. HR 112 bpm     Max Ex. BP 162/64       Interval HR   1 Minute HR 102     2 Minute HR 103     3 Minute HR 107     4 Minute HR 110     5 Minute HR 106     6 Minute HR 112     2 Minute Post HR 106     Interval Heart Rate? Yes       Interval Oxygen    Interval Oxygen ? Yes     Baseline Oxygen  Saturation % 97 %     1 Minute Oxygen  Saturation % 98 %     1 Minute Liters of Oxygen  2 L     2 Minute Oxygen  Saturation % 97 %     2 Minute Liters of Oxygen  2 L     3 Minute Oxygen  Saturation % 96 %     3 Minute Liters of Oxygen  2 L     4 Minute Oxygen  Saturation % 96 %     4 Minute Liters of Oxygen  2 L     5 Minute Oxygen  Saturation % 97 %     5 Minute Liters of Oxygen  2 L     6 Minute Oxygen  Saturation % 95 %     6 Minute Liters of Oxygen  2 L     2 Minute Post Oxygen  Saturation % 98 %     2 Minute Post Liters of Oxygen  2 L       Oxygen  Initial Assessment:  Oxygen  Initial Assessment - 02/11/24 1537       Home Oxygen    Home Oxygen  Device Portable Concentrator;Home Concentrator    Sleep Oxygen  Prescription Continuous    Liters per minute 2    Home Exercise Oxygen  Prescription Continuous    Liters per minute 2    Home Resting Oxygen  Prescription None    Compliance with Home Oxygen  Use Yes      Initial 6 min Walk   Oxygen  Used Continuous    Liters per minute 2      Program Oxygen  Prescription   Program Oxygen  Prescription Continuous    Liters per minute 2      Intervention   Short Term Goals To learn and exhibit compliance with exercise, home and travel O2 prescription;To learn and understand importance of monitoring SPO2 with pulse oximeter and demonstrate accurate use of the pulse oximeter.;To learn and demonstrate proper pursed lip breathing techniques or other breathing techniques. ;To learn and demonstrate proper use of respiratory  medications;To learn and understand importance of maintaining oxygen  saturations>88%    Long  Term Goals Exhibits compliance with exercise, home  and travel O2 prescription;Maintenance of O2 saturations>88%;Compliance with respiratory medication;Demonstrates proper use of MDI's;Exhibits proper breathing techniques, such as pursed lip breathing or other method taught during program session;Verbalizes importance of monitoring SPO2 with pulse oximeter and return demonstration          Oxygen  Re-Evaluation:  Oxygen  Re-Evaluation     Row Name 02/18/24 1555 03/10/24 1546 04/01/24 1558 05/05/24 1535 05/19/24 1548     Program Oxygen  Prescription   Program Oxygen  Prescription -- Continuous Continuous Continuous Continuous   Liters per minute -- 2 2 2 2      Home Oxygen    Home Oxygen  Device -- Portable Concentrator;Home Concentrator Portable Concentrator;Home Concentrator Portable Concentrator;Home Concentrator Portable Concentrator;Home Concentrator   Sleep Oxygen  Prescription -- Continuous Continuous;CPAP  Nancy [name] states she cannot  tolerate it. Continuous;CPAP  does not tolerate Continuous;CPAP   Liters per minute -- 2 2 2 2    Home Exercise Oxygen  Prescription -- Continuous Continuous Continuous Continuous   Liters per minute -- 2 2 2 2    Home Resting Oxygen  Prescription -- None None None None   Compliance with Home Oxygen  Use -- -- No No No   Comments -- -- -- --  does not wear CPAP --     Goals/Expected Outcomes   Short Term Goals -- To learn and exhibit compliance with exercise, home and travel O2 prescription;To learn and understand importance of monitoring SPO2 with pulse oximeter and demonstrate accurate use of the pulse oximeter.;To learn and demonstrate proper pursed lip breathing techniques or other breathing techniques. ;To learn and demonstrate proper use of respiratory medications;To learn and understand importance of maintaining oxygen  saturations>88% Other;To learn and understand  importance of maintaining oxygen  saturations>88% Other To learn and understand importance of monitoring SPO2 with pulse oximeter and demonstrate accurate use of the pulse oximeter.;To learn and exhibit compliance with exercise, home and travel O2 prescription   Long  Term Goals -- Exhibits compliance with exercise, home  and travel O2 prescription;Maintenance of O2 saturations>88%;Compliance with respiratory medication;Demonstrates proper use of MDI's;Exhibits proper breathing techniques, such as pursed lip breathing or other method taught during program session;Verbalizes importance of monitoring SPO2 with pulse oximeter and return demonstration Other;Maintenance of O2 saturations>88% Other Exhibits compliance with exercise, home  and travel O2 prescription;Verbalizes importance of monitoring SPO2 with pulse oximeter and return demonstration   Comments Reviewed PLB technique with pt.  Talked about how it works and it's importance in maintaining their exercise saturations. Nancy [name] continues to practice PLB technique, although she reports that she is still SOB even when wearing her O2. She states that she feels the same SOB while wearing her oxygen  even though her O2 saturations have stayed above 90% while on continuous O2. Talked about how PLB works and it's importance in maintaining their exercise saturations. Nancy [name] states that her concentrator does not do anything for her shortness of breath so she does not use it when she is out and about. Informed her the differences in shortness of breath and oxygenation. Patient verbalizes understanding. She states she does not tolerate wearing any of her CPAP mask. Nancy [name] states that she is not wearing her CPAP due to it falling off her face. She states it does not stay on so she does not wear it. Informed patient why it is important to wear it. She states aving about  five different mask and nothing is working for her. Nancy [name] report that she has been using her oxygen  at night  again. She does not use oxygen  during the day but just stops and rests when she gets SOB. PLB was reviewed and patient states that she does use PLB at home to help control SOB. She does have a pulse ox at home and and does occationally monitor SaO2. She states her oxygen  levels are good at rest but do drop sometimes with active.   Goals/Expected Outcomes Short: Become more profiecient at using PLB.   Long: Become independent at using PLB. Short: Become more profiecient at using PLB.   Long: Become independent at using PLB. Short: maintain spo2 greater than 88 percent on exertion. Long: maintain oxygen  saturation independently. Short: give her CPAP another try. Long: adhere to using a CPAP. Short: continue to use oygen at night. Long: adhere to using CPAP long term.  Oxygen  Discharge (Final Oxygen  Re-Evaluation):  Oxygen  Re-Evaluation - 05/19/24 1548       Program Oxygen  Prescription   Program Oxygen  Prescription Continuous    Liters per minute 2      Home Oxygen    Home Oxygen  Device Portable Concentrator;Home Concentrator    Sleep Oxygen  Prescription Continuous;CPAP    Liters per minute 2    Home Exercise Oxygen  Prescription Continuous    Liters per minute 2    Home Resting Oxygen  Prescription None    Compliance with Home Oxygen  Use No      Goals/Expected Outcomes   Short Term Goals To learn and understand importance of monitoring SPO2 with pulse oximeter and demonstrate accurate use of the pulse oximeter.;To learn and exhibit compliance with exercise, home and travel O2 prescription    Long  Term Goals Exhibits compliance with exercise, home  and travel O2 prescription;Verbalizes importance of monitoring SPO2 with pulse oximeter and return demonstration    Comments Idolina report that she has been using her oxygen  at night again. She does not use oxygen  during the day but just stops and rests when she gets SOB. PLB was reviewed and patient states that she does use PLB at home to help  control SOB. She does have a pulse ox at home and and does occationally monitor SaO2. She states her oxygen  levels are good at rest but do drop sometimes with active.    Goals/Expected Outcomes Short: continue to use oygen at night. Long: adhere to using CPAP long term.          Initial Exercise Prescription:  Initial Exercise Prescription - 02/11/24 1500       Date of Initial Exercise RX and Referring Provider   Date 02/11/24    Referring Provider Dr. Lavelle Servant      Oxygen    Oxygen  Continuous    Liters 2    Maintain Oxygen  Saturation 88% or higher      Treadmill   MPH 1.5    Grade 0    Minutes 15    METs 2.15      NuStep   Level 2    SPM 80    Minutes 15    METs 2.7      REL-XR   Level 2    Watts 25    Speed 50    Minutes 15    METs 2.7      Track   Laps 20    Minutes 15    METs 2.09      Intensity   THRR 40-80% of Max Heartrate 122-147    Ratings of Perceived Exertion 11-13    Perceived Dyspnea 0-4      Resistance Training   Training Prescription Yes    Weight 3    Reps 10-15          Perform Capillary Blood Glucose checks as needed.  Exercise Prescription Changes:   Exercise Prescription Changes     Row Name 02/11/24 1500 03/04/24 1600 03/16/24 1400 04/01/24 1700 04/07/24 1500     Response to Exercise   Blood Pressure (Admit) 122/58 108/60 138/66 120/70 --   Blood Pressure (Exercise) 162/64 158/82 -- 146/72 --   Blood Pressure (Exit) 142/60 126/74 118/60 118/88 --   Heart Rate (Admit) 98 bpm 95 bpm 106 bpm 109 bpm --   Heart Rate (Exercise) 112 bpm 108 bpm 117 bpm 120 bpm --   Heart Rate (Exit) 106 bpm 90 bpm 101 bpm 108 bpm --  Oxygen  Saturation (Admit) 97 % 95 % 98 % 97 % --   Oxygen  Saturation (Exercise) 95 % 88 % 94 % 95 % --   Oxygen  Saturation (Exit) 98 % 94 % 97 % 96 % --   Rating of Perceived Exertion (Exercise) 12 13 13 13  --   Perceived Dyspnea (Exercise) 4 4 3 3  --   Symptoms SOB none none none --   Comments  results First 2 weeks of exercise -- -- --   Duration -- Progress to 30 minutes of  aerobic without signs/symptoms of physical distress Continue with 30 min of aerobic exercise without signs/symptoms of physical distress. Continue with 30 min of aerobic exercise without signs/symptoms of physical distress. Continue with 30 min of aerobic exercise without signs/symptoms of physical distress.   Intensity -- THRR unchanged THRR unchanged THRR unchanged THRR unchanged     Progression   Progression -- Continue to progress workloads to maintain intensity without signs/symptoms of physical distress. Continue to progress workloads to maintain intensity without signs/symptoms of physical distress. Continue to progress workloads to maintain intensity without signs/symptoms of physical distress. Continue to progress workloads to maintain intensity without signs/symptoms of physical distress.   Average METs -- 2.7 2.33 2.02 2.02     Resistance Training   Training Prescription -- Yes Yes Yes Yes   Weight -- 3 3 3 3    Reps -- 10-15 10-15 10-15 10-15     Interval Training   Interval Training -- No No No No     Oxygen    Oxygen  -- Continuous Continuous Continuous Continuous   Liters -- 3 2 2 2      Treadmill   MPH -- 1.5 1.5 1.3 1.3   Grade -- 0 0 0 0   Minutes -- 15 15 15 15    METs -- 2.15 2.15 1.99 1.99     Recumbant Bike   Level -- 1.4 -- -- --   Watts -- 25 -- -- --   Minutes -- 15 -- -- --   METs -- 2.86 -- -- --     NuStep   Level -- 3 -- 4 4   Minutes -- 15 -- 15 15   METs -- 2.8 -- 2.1 2.1     REL-XR   Level -- 4 4 -- --   Minutes -- 15 15 -- --   METs -- 3.9 3 -- --     T5 Nustep   Level -- -- -- 3  T6 3  T6   SPM -- -- -- 80 80   Minutes -- -- -- 15 15   METs -- -- -- 2 2     Biostep-RELP   Level -- -- 1 -- --   Minutes -- -- 15 -- --   METs -- -- 2 -- --     Home Exercise Plan   Plans to continue exercise at -- -- -- -- Home (comment)  weights and walking   Frequency  -- -- -- -- Add 2 additional days to program exercise sessions.   Initial Home Exercises Provided -- -- -- -- 04/07/24     Oxygen    Maintain Oxygen  Saturation -- 88% or higher 88% or higher 88% or higher 88% or higher    Row Name 04/14/24 1500 04/28/24 1400 05/11/24 1300 05/25/24 1100       Response to Exercise   Blood Pressure (Admit) 138/66 112/68 132/76 122/80    Blood Pressure (Exercise) 124/80 -- -- --  Blood Pressure (Exit) 124/68 102/60 106/64 120/60    Heart Rate (Admit) 103 bpm 95 bpm 91 bpm 91 bpm    Heart Rate (Exercise) 114 bpm 110 bpm 101 bpm 100 bpm    Heart Rate (Exit) 105 bpm 105 bpm 98 bpm 94 bpm    Oxygen  Saturation (Admit) 97 % 96 % 95 % 97 %    Oxygen  Saturation (Exercise) 94 % 94 % 94 % 95 %    Oxygen  Saturation (Exit) 97 % 98 % 96 % 97 %    Rating of Perceived Exertion (Exercise) 13 13 12 12     Perceived Dyspnea (Exercise) 4 3 3.5 3    Symptoms none none none none    Duration Continue with 30 min of aerobic exercise without signs/symptoms of physical distress. Continue with 30 min of aerobic exercise without signs/symptoms of physical distress. Continue with 30 min of aerobic exercise without signs/symptoms of physical distress. Continue with 30 min of aerobic exercise without signs/symptoms of physical distress.    Intensity THRR unchanged THRR unchanged THRR unchanged THRR unchanged      Progression   Progression Continue to progress workloads to maintain intensity without signs/symptoms of physical distress. Continue to progress workloads to maintain intensity without signs/symptoms of physical distress. Continue to progress workloads to maintain intensity without signs/symptoms of physical distress. Continue to progress workloads to maintain intensity without signs/symptoms of physical distress.    Average METs 2.3 2.22 2.29 2.5      Resistance Training   Training Prescription Yes Yes Yes Yes    Weight 3 3 3  lb 3 lb    Reps 10-15 10-15 10-15 10-15       Interval Training   Interval Training No No No No      Oxygen    Oxygen  Continuous Continuous Continuous Continuous    Liters 2 2 2 2       Treadmill   MPH 1.4 1.5 1.4 1.5    Grade 0 0 0 0    Minutes 15 15 15 15     METs 2.07 2.15 2.07 2.15      NuStep   Level -- 3 -- --    Minutes -- 15 -- --    METs -- 2.3 -- --      REL-XR   Level 4 -- 4 4    Minutes 15 -- 15 15    METs 3.3 -- 3.5 3.7      T5 Nustep   Level -- -- 3  T6 nustep --    Minutes -- -- 15 --    METs -- -- 2 --      Biostep-RELP   Level 1 -- 2 --    Minutes 15 -- 15 --    METs 2 -- 2 --      Home Exercise Plan   Plans to continue exercise at Home (comment)  weights and walking Home (comment)  weights and walking Home (comment)  weights and walking Home (comment)  weights and walking    Frequency Add 2 additional days to program exercise sessions. Add 2 additional days to program exercise sessions. Add 2 additional days to program exercise sessions. Add 2 additional days to program exercise sessions.    Initial Home Exercises Provided 04/07/24 04/07/24 04/07/24 04/07/24      Oxygen    Maintain Oxygen  Saturation 88% or higher 88% or higher 88% or higher 88% or higher       Exercise Comments:   Exercise Comments  Row Name 02/18/24 1553           Exercise Comments First full day of exercise!  Patient was oriented to gym and equipment including functions, settings, policies, and procedures.  Patient's individual exercise prescription and treatment plan were reviewed.  All starting workloads were established based on the results of the 6 minute walk test done at initial orientation visit.  The plan for exercise progression was also introduced and progression will be customized based on patient's performance and goals.          Exercise Goals and Review:   Exercise Goals     Row Name 02/11/24 1535             Exercise Goals   Increase Physical Activity Yes       Intervention Provide advice,  education, support and counseling about physical activity/exercise needs.;Develop an individualized exercise prescription for aerobic and resistive training based on initial evaluation findings, risk stratification, comorbidities and participant's personal goals.       Expected Outcomes Short Term: Attend rehab on a regular basis to increase amount of physical activity.;Long Term: Add in home exercise to make exercise part of routine and to increase amount of physical activity.;Long Term: Exercising regularly at least 3-5 days a week.       Increase Strength and Stamina Yes       Intervention Provide advice, education, support and counseling about physical activity/exercise needs.;Develop an individualized exercise prescription for aerobic and resistive training based on initial evaluation findings, risk stratification, comorbidities and participant's personal goals.       Expected Outcomes Short Term: Increase workloads from initial exercise prescription for resistance, speed, and METs.;Short Term: Perform resistance training exercises routinely during rehab and add in resistance training at home;Long Term: Improve cardiorespiratory fitness, muscular endurance and strength as measured by increased METs and functional capacity ( )       Able to understand and use rate of perceived exertion (RPE) scale Yes       Intervention Provide education and explanation on how to use RPE scale       Expected Outcomes Short Term: Able to use RPE daily in rehab to express subjective intensity level;Long Term:  Able to use RPE to guide intensity level when exercising independently       Able to understand and use Dyspnea scale Yes       Intervention Provide education and explanation on how to use Dyspnea scale       Expected Outcomes Short Term: Able to use Dyspnea scale daily in rehab to express subjective sense of shortness of breath during exertion;Long Term: Able to use Dyspnea scale to guide intensity level when  exercising independently       Knowledge and understanding of Target Heart Rate Range (THRR) Yes       Intervention Provide education and explanation of THRR including how the numbers were predicted and where they are located for reference       Expected Outcomes Short Term: Able to state/look up THRR;Long Term: Able to use THRR to govern intensity when exercising independently;Short Term: Able to use daily as guideline for intensity in rehab       Able to check pulse independently Yes       Intervention Provide education and demonstration on how to check pulse in carotid and radial arteries.;Review the importance of being able to check your own pulse for safety during independent exercise       Expected Outcomes Short  Term: Able to explain why pulse checking is important during independent exercise       Understanding of Exercise Prescription Yes       Intervention Provide education, explanation, and written materials on patient's individual exercise prescription       Expected Outcomes Short Term: Able to explain program exercise prescription;Long Term: Able to explain home exercise prescription to exercise independently          Exercise Goals Re-Evaluation :  Exercise Goals Re-Evaluation     Row Name 02/18/24 1554 03/04/24 1649 03/16/24 1452 04/01/24 1751 04/07/24 1550     Exercise Goal Re-Evaluation   Exercise Goals Review Increase Physical Activity;Able to understand and use rate of perceived exertion (RPE) scale;Knowledge and understanding of Target Heart Rate Range (THRR);Understanding of Exercise Prescription;Increase Strength and Stamina;Able to understand and use Dyspnea scale;Able to check pulse independently Increase Physical Activity;Increase Strength and Stamina;Understanding of Exercise Prescription Increase Physical Activity;Increase Strength and Stamina;Understanding of Exercise Prescription Increase Physical Activity;Increase Strength and Stamina;Understanding of Exercise  Prescription Increase Physical Activity;Able to understand and use rate of perceived exertion (RPE) scale;Knowledge and understanding of Target Heart Rate Range (THRR);Understanding of Exercise Prescription;Increase Strength and Stamina;Able to understand and use Dyspnea scale;Able to check pulse independently   Comments Reviewed RPE and dyspnea scale, THR and program prescription with pt today.  Pt voiced understanding and was given a copy of goals to take home. Ryley is off to a good start in the program. She was able to attend her first few sessions during this weeks review period. During these sessions she was able to increase from level 2 to 3 on the T4 nustep, and increase from level 2 to 4 on the XR. We will continue to monitor her progress in the program. Ilah is doing well in the program and only attended 2 sessions in this review. She maintained level 4 on the XR and a speed of 1.5 mph on the treadmiil with no incline. She also added the biostep at level 1 to her exercise prescription. We will continue to monitor her progress in the program. Nancy [name] is doing well in the program and only attended 2 sessions in this review. She increased to level 4 on the T4 nustep. She also used the T6 nustep at level 3 and the treadmill at a speed of 1.3 mph with no incline. We will continue to monitor her progress in the program. Reviewed home exercise with pt today.  Pt plans to use hand weights and walk for exercise.  Reviewed THR, pulse, RPE, sign and symptoms, pulse oximetery and when to call 911 or MD.  Also discussed weather considerations and indoor options.  Pt voiced understanding.   Expected Outcomes Short: Use RPE daily to regulate intensity.  Long: Follow program prescription in THR. Short: Continue to follow exercise prescription. Long: Continue exercise to improve strength and stamina. Short: Attend rehab more consistently. Long: Continue exercise to improve strength and stamina. Short: Attend rehab more  consistently. Long: Continue exercise to improve strength and stamina. Short: add 1-2 days a week of exercise at home on off days of rehab. Long: Maintain independent exercise routine.    Row Name 04/14/24 1549 04/28/24 1454 05/11/24 1346 05/19/24 1542 05/25/24 1135     Exercise Goal Re-Evaluation   Exercise Goals Review Increase Physical Activity;Increase Strength and Stamina;Understanding of Exercise Prescription Increase Physical Activity;Increase Strength and Stamina;Understanding of Exercise Prescription Increase Physical Activity;Increase Strength and Stamina;Understanding of Exercise Prescription Increase Physical Activity;Increase Strength and Stamina;Understanding  of Exercise Prescription Increase Physical Activity;Increase Strength and Stamina;Understanding of Exercise Prescription   Comments Nancy [name] is doing well in rehab. She was able to increase her treadmill speed from 1.3mph to 1.4mph. She was also able to maintain level 4 on the XR. We will continue to monitor her progress in the program. Nancy [name] continues to do well in rehab. She was only able to attend one session during this review period. During the one session she was able to increase her speed on the treadmill from 1.4 to 1.5 mph. We will continue to monitor her progress in the program. Nancy [name] continues to do well in rehab. She continues to walk on the treadmill at a speed of 1.4 mph with no incline. She also improved to level 2 on the biostep and continues to work at level 3 on the T6 nustep and level 4 on the XR. We will continue to monitor her progress in the program. Nancy [name] reports that she does not do much structured exercise at home but tries to remain as active as she can with her house work. She was encouraged to try to attend pulmonary rehab consistently and to try to incorporate more more home activites as she feels she can tolerate it. She reports that her SOB is her main limiting factor. Nancy [name] is doing well in rehab. She has been able to  increase her speed on the treadmill from 1.4 to 1. with no incline. She was also able to maintain a level 4 on the XR. We will continue to monitor her progress in the program.   Expected Outcomes Short: Continue to increase treadmill workloads. Long: Continue exercise to improve strength and stamina. Short: Continue to increase treadmill workloads. Long: Continue exercise to improve strength and stamina. Short: Continue to progressively increase treadmill workload. Long: Continue exercise to improve strength and stamina. Short: try to attend rehab consistently and add more activiity at home. Long: maintain an independent exercise routine upon graduation from rehab. Short: Continue to increase treadmill workload. Long: Continue exercise to improve strength and stamina.      Discharge Exercise Prescription (Final Exercise Prescription Changes):  Exercise Prescription Changes - 05/25/24 1100       Response to Exercise   Blood Pressure (Admit) 122/80    Blood Pressure (Exit) 120/60    Heart Rate (Admit) 91 bpm    Heart Rate (Exercise) 100 bpm    Heart Rate (Exit) 94 bpm    Oxygen  Saturation (Admit) 97 %    Oxygen  Saturation (Exercise) 95 %    Oxygen  Saturation (Exit) 97 %    Rating of Perceived Exertion (Exercise) 12    Perceived Dyspnea (Exercise) 3    Symptoms none    Duration Continue with 30 min of aerobic exercise without signs/symptoms of physical distress.    Intensity THRR unchanged      Progression   Progression Continue to progress workloads to maintain intensity without signs/symptoms of physical distress.    Average METs 2.5      Resistance Training   Training Prescription Yes    Weight 3 lb    Reps 10-15      Interval Training   Interval Training No      Oxygen    Oxygen  Continuous    Liters 2      Treadmill   MPH 1.5    Grade 0    Minutes 15    METs 2.15      REL-XR   Level 4    Minutes  15    METs 3.7      Home Exercise Plan   Plans to continue  exercise at Home (comment)   weights and walking   Frequency Add 2 additional days to program exercise sessions.    Initial Home Exercises Provided 04/07/24      Oxygen    Maintain Oxygen  Saturation 88% or higher          Nutrition:  Target Goals: Understanding of nutrition guidelines, daily intake of sodium 1500mg , cholesterol 200mg , calories 30% from fat and 7% or less from saturated fats, daily to have 5 or more servings of fruits and vegetables.  Education: All About Nutrition: -Group instruction provided by verbal, written material, interactive activities, discussions, models, and posters to present general guidelines for heart healthy nutrition including fat, fiber, MyPlate, the role of sodium in heart healthy nutrition, utilization of the nutrition label, and utilization of this knowledge for meal planning. Follow up email sent as well. Written material given at graduation. Flowsheet Row Pulmonary Rehab from 01/29/2023 in [facility] Card[facility] and Pulmonary Rehab  Education need identified 11/18/22    Biometrics:  Pre Biometrics - 02/11/24 1535       Pre Biometrics   Height 5' 7.55 (1.716 m)    Weight 201 lb 11.2 oz (91.5 kg)    Waist Circumference 39.5 inches    Hip Circumference 43 inches    Waist to Hip Ratio 0.92 %    BMI (Calculated) 31.07    Single Leg Stand 7.34 seconds           Nutrition Therapy Plan and Nutrition Goals:   Nutrition Assessments:  MEDIFICTS Score Key: >=70 Need to make dietary changes  40-70 Heart Healthy Diet <= 40 Therapeutic Level Cholesterol Diet  Flowsheet Row Pulmonary Rehab from 02/13/2023 in [facility] Cardiac and Pulmonary Rehab  Picture Your Plate Total Score on Discharge 56   Picture Your Plate Scores: <59 Unhealthy dietary pattern with much room for improvement. 41-50 Dietary pattern unlikely to meet recommendations for good health and room for improvement. 51-60 More healthful dietary pattern, with some room for improvement.  >60  Healthy dietary pattern, although there may be some specific behaviors that could be improved.   Nutrition Goals Re-Evaluation:  Nutrition Goals Re-Evaluation     Row Name 03/10/24 1535 04/01/24 1556 05/19/24 1537         Goals   Nutrition Goal Mliss has deferred to meet with the RD at this time. She said she may be open to meeting with him in the future. -- --     Comment -- Patient was informed on why it is important to maintain a balanced diet when dealing with Respiratory issues. Explained that it takes a lot of energy to breath and when they are short of breath often they will need to have a good diet to help keep up with the calories they are expending for breathing. Nancy [name] continues to [employer] appointment but states she is trying to eat as healthy as possible     Expected Outcome -- Short: Choose and plan snacks accordingly to patients caloric intake to improve breathing. Long: Maintain a diet independently that meets their caloric intake to aid in daily shortness of breath. --        Nutrition Goals Discharge (Final Nutrition Goals Re-Evaluation):  Nutrition Goals Re-Evaluation - 05/19/24 1537       Goals   Comment Nancy [name] continues to [employer] appointment but states she is trying to eat as  healthy as possible          Psychosocial: Target Goals: Acknowledge presence or absence of significant depression and/or stress, maximize coping skills, provide positive support system. Participant is able to verbalize types and ability to use techniques and skills needed for reducing stress and depression.   Education: Stress, Anxiety, and Depression - Group verbal and visual presentation to define topics covered.  Reviews how body is impacted by stress, anxiety, and depression.  Also discusses healthy ways to reduce stress and to treat/manage anxiety and depression.  Written material given at graduation. Flowsheet Row Pulmonary Rehab from 01/29/2023 in [facility] Cardiac and Pulmonary Rehab  Date  01/22/23  Educator KW  Instruction Review Code 1- [employer] Understanding    Education: Sleep Hygiene -Provides group verbal and written instruction about how sleep can affect your health.  Define sleep hygiene, discuss sleep cycles and impact of sleep habits. Review good sleep hygiene tips.    Initial Review & Psychosocial Screening:  Initial Psych Review & Screening - 12/24/23 1343       Initial Review   Current issues with None Identified      Family Dynamics   Good Support System? Yes    Comments She can look look to her sister for support. She lives alone and feels like she has no mental instability.      Barriers   Psychosocial barriers to participate in program The patient should benefit from training in stress management and relaxation.;There are no identifiable barriers or psychosocial needs.      Screening Interventions   Interventions Encouraged to exercise;Provide feedback about the scores to participant;Program counselor consult;To provide support and resources with identified psychosocial needs    Expected Outcomes Short Term goal: Utilizing psychosocial counselor, staff and physician to assist with identification of specific Stressors or current issues interfering with healing process. Setting desired goal for each stressor or current issue identified.;Long Term Goal: Stressors or current issues are controlled or eliminated.;Short Term goal: Identification and review with participant of any Quality of Life or Depression concerns found by scoring the questionnaire.;Long Term goal: The participant improves quality of Life and PHQ9 Scores as seen by post scores and/or verbalization of changes          Quality of Life Scores:  Scores of 19 and below usually indicate a poorer quality of life in these areas.  A difference of  2-3 points is a clinically meaningful difference.  A difference of 2-3 points in the total score of the Quality of Life Index has been associated with  significant improvement in overall quality of life, self-image, physical symptoms, and general health in studies assessing change in quality of life.  PHQ-9: Review Flowsheet  More data exists      02/11/2024 08/20/2023 02/13/2023 01/28/2023 11/18/2022  Depression screen PHQ 2/9  Decreased Interest 0 0 1 0 1  Down, Depressed, Hopeless 0 0 0 0 0  PHQ - 2 Score 0 0 1 0 1  Altered sleeping 0 1 0 0 0  Tired, decreased energy 0 1 3 3 3   Change in appetite 0 0 0 0 0  Feeling bad or failure about yourself  0 0 0 0 0  Trouble concentrating 0 0 0 0 0  Moving slowly or fidgety/restless 0 0 0 0 0  Suicidal thoughts 0 0 0 0 0  PHQ-9 Score 0 2 4 3 4   Difficult doing work/chores - - Somewhat difficult Not difficult at all -   Interpretation  of Total Score  Total Score Depression Severity:  1-4 = Minimal depression, 5-9 = Mild depression, 10-14 = Moderate depression, 15-19 = Moderately severe depression, 20-27 = Severe depression   Psychosocial Evaluation and Intervention:  Psychosocial Evaluation - 12/24/23 1344       Psychosocial Evaluation & Interventions   Interventions Encouraged to exercise with the program and follow exercise prescription    Comments She can look look to her sister for support. She lives alone and feels like she has no mental instability.    Expected Outcomes Short: attend pulmonary rehab for education and exercise. Long: develop and maintain positive self care habits.    Continue Psychosocial Services  Follow up required by staff          Psychosocial Re-Evaluation:  Psychosocial Re-Evaluation     Row Name 03/10/24 1536 04/01/24 1557 05/19/24 1546         Psychosocial Re-Evaluation   Current issues with Current Stress Concerns;Current Sleep Concerns Current Stress Concerns;Current Sleep Concerns Current Stress Concerns;Current Sleep Concerns     Comments Salam reports no major stressors at this time. Maanya states that she is frustrated with her breathing as it has  restricted her from doing the things she enjoys. She states that se struggles with staying asleep at this time. She has been taking tylenol  PM, but reports it only helps her sleep sometimes. Patient reports no issues with their current mental states, sleep, stress, depression or anxiety. Will follow up with patient in a few weeks for any changes. Patient reports no concerns with sleep, stress,  or mental health. She wants to try to come more consistently to pulmonary rehab for exercise and to try to help improve SOB.     Expected Outcomes Short: Continue to attend rehab for exercise and mental boost. Long: Maintain positive outlook. Short: Continue to exercise regularly to support mental health and notify staff of any changes. Long: maintain mental health and well being through teaching of rehab or prescribed medications independently. Short: consistently attend pulmonary rehab for mental health benefits of exercise. Long: maintain good mental health routine.     Interventions Encouraged to attend Pulmonary Rehabilitation for the exercise Encouraged to attend Pulmonary Rehabilitation for the exercise Encouraged to attend Pulmonary Rehabilitation for the exercise     Continue Psychosocial Services  No Follow up required Follow up required by staff Follow up required by staff       Initial Review   Source of Stress Concerns Chronic Illness -- --     Comments Her shortness of breath is her main stressor -- --        Psychosocial Discharge (Final Psychosocial Re-Evaluation):  Psychosocial Re-Evaluation - 05/19/24 1546       Psychosocial Re-Evaluation   Current issues with Current Stress Concerns;Current Sleep Concerns    Comments Patient reports no concerns with sleep, stress,  or mental health. She wants to try to come more consistently to pulmonary rehab for exercise and to try to help improve SOB.    Expected Outcomes Short: consistently attend pulmonary rehab for mental health benefits of exercise.  Long: maintain good mental health routine.    Interventions Encouraged to attend Pulmonary Rehabilitation for the exercise    Continue Psychosocial Services  Follow up required by staff          Education: Education Goals: Education classes will be provided on a weekly basis, covering required topics. Participant will state understanding/return demonstration of topics presented.  Learning Barriers/Preferences:  General Pulmonary Education Topics:  Infection Prevention: - Provides verbal and written material to individual with discussion of infection control including proper hand washing and proper equipment cleaning during exercise session. Flowsheet Row Pulmonary Rehab from 02/11/2024 in [facility] Cardiac and Pulmonary Rehab  Date 02/11/24  Educator [facility]  Instruction Review Code 1- Verbalizes Understanding    Falls Prevention: - Provides verbal and written material to individual with discussion of falls prevention and safety. Flowsheet Row Pulmonary Rehab from 02/11/2024 in [facility] Cardiac and Pulmonary Rehab  Date 02/11/24  Educator [facility]  Instruction Review Code 1- Verbalizes Understanding    Chronic Lung Disease Review: - Group verbal instruction with posters, models, PowerPoint presentations and videos,  to review new updates, new respiratory medications, new advancements in procedures and treatments. Providing information on websites and 800 numbers for continued self-education. Includes information about supplement oxygen , available portable oxygen  systems, continuous and intermittent flow rates, oxygen  safety, concentrators, and Medicare reimbursement for oxygen . Explanation of Pulmonary Drugs, including class, frequency, complications, importance of spacers, rinsing mouth after steroid MDI's, and proper cleaning methods for nebulizers. Review of basic lung anatomy and physiology related to function, structure, and complications of lung disease. Review of risk factors. Discussion about  methods for diagnosing sleep apnea and types of masks and machines for OSA. Includes a review of the use of types of environmental controls: home humidity, furnaces, filters, dust mite/pet prevention, HEPA vacuums. Discussion about weather changes, air quality and the benefits of nasal washing. Instruction on Warning signs, infection symptoms, calling MD promptly, preventive modes, and value of vaccinations. Review of effective airway clearance, coughing and/or vibration techniques. Emphasizing that all should Create an Action Plan. Written material given at graduation. Flowsheet Row Pulmonary Rehab from 01/29/2023 in [facility] Cardiac and Pulmonary Rehab  Education need identified 11/18/22  Date 01/15/23  Educator [facility]  Instruction Review Code 1- Verbalizes Understanding    AED/CPR: - Group verbal and written instruction with the use of models to demonstrate the basic use of the AED with the basic ABC's of resuscitation.    Anatomy and Cardiac Procedures: - Group verbal and visual presentation and models provide information about basic cardiac anatomy and function. Reviews the testing methods done to diagnose heart disease and the outcomes of the test results. Describes the treatment choices: Medical Management, Angioplasty, or Coronary Bypass Surgery for treating various heart conditions including Myocardial Infarction, Angina, Valve Disease, and Cardiac Arrhythmias.  Written material given at graduation. Flowsheet Row Pulmonary Rehab from 01/29/2023 in [facility] Cardiac and Pulmonary Rehab  Date 12/18/22  Educator SB  Instruction Review Code 1- Verbalizes Understanding    Medication Safety: - Group verbal and visual instruction to review commonly prescribed medications for heart and lung disease. Reviews the medication, class of the drug, and side effects. Includes the steps to properly store meds and maintain the prescription regimen.  Written material given at graduation.   Other: -Provides group  and verbal instruction on various topics (see comments) Flowsheet Row Pulmonary Rehab from 01/29/2023 in [facility] Cardiac and Pulmonary Rehab  Date 12/25/22  Educator SB  Instruction Review Code 1- Verbalizes Understanding  [Jeopardy]    Knowledge Questionnaire Score:    Core Components/Risk Factors/Patient Goals at Admission:  Personal Goals and Risk Factors at Admission - 12/24/23 1342       Core Components/Risk Factors/Patient Goals on Admission    Weight Management Yes;Weight Loss    Intervention Weight Management: Develop a combined nutrition and exercise program designed to reach desired caloric intake,  while maintaining appropriate intake of nutrient and fiber, sodium and fats, and appropriate energy expenditure required for the weight goal.;Weight Management: Provide education and appropriate resources to help participant work on and attain dietary goals.;Weight Management/Obesity: Establish reasonable short term and long term weight goals.;Obesity: Provide education and appropriate resources to help participant work on and attain dietary goals.    Expected Outcomes Short Term: Continue to assess and modify interventions until short term weight is achieved;Long Term: Adherence to nutrition and physical activity/exercise program aimed toward attainment of established weight goal;Weight Loss: Understanding of general recommendations for a balanced deficit meal plan, which promotes 1-2 lb weight loss per week and includes a negative energy balance of [id number] kcal/d;Understanding recommendations for meals to include 15-35% energy as protein, 25-35% energy from fat, 35-60% energy from carbohydrates, less than 200mg  of dietary cholesterol, 20-35 gm of total fiber daily;Understanding of distribution of calorie intake throughout the day with the consumption of 4-5 meals/snacks    Improve shortness of breath with ADL's Yes    Intervention Provide education, individualized exercise plan and daily  activity instruction to help decrease symptoms of SOB with activities of daily living.    Expected Outcomes Short Term: Improve cardiorespiratory fitness to achieve a reduction of symptoms when performing ADLs;Long Term: Be able to perform more ADLs without symptoms or delay the onset of symptoms    Hypertension Yes    Intervention Provide education on lifestyle modifcations including regular physical activity/exercise, weight management, moderate sodium restriction and increased consumption of fresh fruit, vegetables, and low fat dairy, alcohol moderation, and smoking cessation.;Monitor prescription use compliance.    Expected Outcomes Short Term: Continued assessment and intervention until BP is < 140/59mm HG in hypertensive participants. < 130/4mm HG in hypertensive participants with diabetes, heart failure or chronic kidney disease.;Long Term: Maintenance of blood pressure at goal levels.          Education:Diabetes - Individual verbal and written instruction to review signs/symptoms of diabetes, desired ranges of glucose level fasting, after meals and with exercise. Acknowledge that pre and post exercise glucose checks will be done for 3 sessions at entry of program.   Know Your Numbers and Heart Failure: - Group verbal and visual instruction to discuss disease risk factors for cardiac and pulmonary disease and treatment options.  Reviews associated critical values for Overweight/Obesity, Hypertension, Cholesterol, and Diabetes.  Discusses basics of heart failure: signs/symptoms and treatments.  Introduces Heart Failure Zone chart for action plan for heart failure.  Written material given at graduation.   Core Components/Risk Factors/Patient Goals Review:   Goals and Risk Factor Review     Row Name 03/10/24 1541 04/01/24 1556 05/19/24 1539         Core Components/Risk Factors/Patient Goals Review   Personal Goals Review Weight Management/Obesity;Improve shortness of breath with ADL's  Improve shortness of breath with ADL's Hypertension     Review Nancy [name] states that she would still like to lose some weight. She would like to get down to 150 lbs but is restricted by her breathing and is not able to exercise as much as she feels she needs to. She is continuing to practice PLB but states she still feels SOB. She reports still feeling SOB even when wearing her oxygen , although her O2 saturations have stayed above 90%. Spoke to patient about their shortness of breath and what they can do to improve. Patient has been informed of breathing techniques when starting the program. Patient is informed to tell staff if they  have had any med changes and that certain meds they are taking or not taking can be causing shortness of breath. Nancy [name] states that she takes all her blood pressur medication and follows up with her doctor to manage this. She does report that she takes her BP at  home and it is usually good.     Expected Outcomes Short: Continue to practice breathing techniques to improve SOB. Long: Continue to manage lifestyle risk factors. Short: Attend [facility] regularly to improve shortness of breath with ADL's. Long: maintain independence with ADL's Short: continue to check BP at home. Long: work with medical team to continue to control blood pressure.        Core Components/Risk Factors/Patient Goals at Discharge (Final Review):   Goals and Risk Factor Review - 05/19/24 1539       Core Components/Risk Factors/Patient Goals Review   Personal Goals Review Hypertension    Review Nancy [name] states that she takes all her blood pressur medication and follows up with her doctor to manage this. She does report that she takes her BP at  home and it is usually good.    Expected Outcomes Short: continue to check BP at home. Long: work with medical team to continue to control blood pressure.          ITP Comments:  ITP Comments     Row Name 12/24/23 1350 02/11/24 1527 02/18/24 1125 02/18/24 1553  03/17/24 1301   ITP Comments Virtual Visit completed. Patient informed on EP and RD appointment and 6 Minute walk test. Patient also informed of patient health questionnaires on My Chart. Patient Verbalizes understanding. Visit diagnosis can be found in [facility] 12/08/2023. Completed and gym orientation. Initial ITP created and sent for review to Dr. Faud Aleskerov, Medical Director. 30 Day review completed. Medical Director ITP review done, changes made as directed, and signed approval by Medical Director.    new to program First full day of exercise!  Patient was oriented to gym and equipment including functions, settings, policies, and procedures.  Patient's individual exercise prescription and treatment plan were reviewed.  All starting workloads were established based on the results of the 6 minute walk test done at initial orientation visit.  The plan for exercise progression was also introduced and progression will be customized based on patient's performance and goals. 30 Day review completed. Medical Director ITP review done, changes made as directed, and signed approval by Medical Director. New to program.    Row Name 04/14/24 1051 05/12/24 1057 06/09/24 1006       ITP Comments 30 Day review completed. Medical Director ITP review done, changes made as directed, and signed approval by Medical Director. 30 Day review completed. Medical Director ITP review done, changes made as directed, and signed approval by Medical Director. 30 Day review completed. Medical Director ITP review done, changes made as directed, and signed approval by Medical Director.        Comments: 30 day review

## 2024-06-10 ENCOUNTER — Encounter

## 2024-06-14 NOTE — H&P (Signed)
 CHIEF COMPLAINT: No chief complaint on file.   Referring Provider: Preminger, Marcey HERO, MD  HISTORY OF PRESENT ILLNESS: Nancy [name] is a 63 y.o. female   History of Present Illness Nancy [name] is a 63 year old female with a history of UPJ obstruction who presents with right flank pain.  She has a history of UPJ obstruction diagnosed 25 years ago, initially asymptomatic with normal renal function. In mid-March 2025, she began experiencing persistent right flank pain, unrelieved by Tylenol  or other pain medications from previous surgeries.  A nuclear medicine renal scan with Lasix  on Mar 29, 2024, showed 68.3% function in the left kidney and 31.5% in the right kidney. A CT scan revealed severe hydronephrosis and parenchymal thinning in the right kidney, with no hydroureter or crossing vessel on the CT angiogram.  Her most recent creatinine level was 0.8, measured two months ago. 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  She has undergone multiple surgeries for aortic dissections, including valve replacements and an 'elephant arch' procedure, resulting in multiple midline chest incisions. No history of abdominal surgeries.  She also has COPD, causing shortness of breath with exertion, not alleviated by oxygen  therapy, although it maintains her oxygen  saturation levels.   All available outside records were reviewed including pertinent labs & imaging results that were available during my care of the patient.  These were considered in my medical decision makingin addtion the history provided by the patient.  He has never had a stroke or heart attack.  He has never had prior urologic surgery.    He has no family history of GU malignancy, UTI or GH.   Abdominal Surgical  History:  Anticoagulation: ASA 81 mg, Coumadin   There is no height or weight on file to calculate BMI.  Serum Cr Trend Lab Results  Component Value Date   CREATININE 0.8 06/02/2024   CREATININE 0.8 12/08/2023   CREATININE 0.8 10/25/2023   CREATININE 0.8 10/22/2023   CREATININE 1.0 07/29/2023    ASSESSMENT and PLAN: Nancy [name] is a 63 y.o. female with RIGHT UPJ obstruction complicated by extensive vascular history.   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 Recent nuclear medicine renal scan shows 31.5% function in the right kidney, indicating significant loss of function over time. Symptoms include persistent right flank pain since March 2025, unrelieved by analgesics. Surgical intervention is considered to prevent further renal deterioration and alleviate pain. Risks include bleeding due to anticoagulation therapy for aortic graft and potential complications from previous aortic surgeries. She prefers surgical repair over stent placement for a definitive solution. - Obtain clearance from provider managing Coumadin  for temporary discontinuation and bridging therapy with Lovenox  prior to surgery. - Coordinate with cardiologist, Dr. Ammon, for bridging therapy plan. - Schedule surgical repair of right UPJ obstruction, tentatively planned for early to mid-August. - Plan for postoperative stent placement to aid healing, with removal approximately three weeks post-surgery.  Aortic dissection and reconstruction Extensive aortic reconstruction with multiple surgeries, including aortic arch repair and endovascular stent placement. Currently on anticoagulation therapy with aspirin  and Coumadin . Surgical planning complicated by need for anticoagulation management and previous thoracic surgeries. - Discuss anticoagulation management with  cardiologist to determine optimal bridging strategy for upcoming  surgery.  COPD COPD with exertional dyspnea not alleviated by supplemental oxygen . Oxygen  therapy maintains saturation but does not relieve dyspnea. Increased surgical risk due to pulmonary status.  Plan  To OR today for RIGHT  robotic assisted pyeloplasty, CYSTOURETHROSCOPY, WITH INSERTION OF INDWELLING URETERAL STENT    MAX-ANTHONY NATALE, NP

## 2024-06-16 ENCOUNTER — Ambulatory Visit

## 2024-06-17 ENCOUNTER — Ambulatory Visit

## 2024-06-18 NOTE — Care Plan (Signed)
 Problem: Knowledge Deficit  Goal: Patient will demonstrate understanding of ERAS principles  Outcome: Progressing     Problem: Mobility  Goal: Patient's ability to ambulate will improve  Outcome: Progressing     Problem: Pain  Goal: Patient's comfort/pain level will allow functional participation in recovery.  Outcome: Progressing     Problem: Nutrition  Goal: Patient will be able to tolerate ordered diet by the time of discharge  Outcome: Progressing     Problem: Risk for infection  Goal: Patient will remain free from infection  Outcome: Progressing     Problem: Fluid Intake  Goal: Patient will remain free of signs/symptoms of dehydration  Outcome: Progressing     Problem: Bowel Function  Goal: Patient will pass gas and/or have a bowel movement  Outcome: Progressing

## 2024-06-18 NOTE — Progress Notes (Signed)
 Virtual Nurse Admission Note  Patient is alert and oriented Alert and Oriented: X 4.  No noted acute distress.  Patient admission questions completed with Patient The following were reviewed: Admission Teaching Topics: VRN role and availability, orders, care plan, basic hospital equipment , basic safety and falls prevention per protocol, and sx to report Questions answered; Patient actively and appropriately engaged in discussion; able to verbalize understanding of admission education.  The following questions were not completed:   height, weight, vitals, braden scale, patient belongings, BMAT,    Care plans reviewed. all appropriate careplans in place at this time, none added at this time.  Care nurse notified of completion of admission questions and education.  Specific/Additional Instructions or comment: n/a

## 2024-06-18 NOTE — Procedures (Signed)
 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   INDICATION(S):   OPERATIVE BILLING:  [id number] - robotic assisted pyeloplasty [phone] - percutaneous placement of ureteral stent   OPERATIVE REPORT:   The patient was brought to the operating room and after induction of general anesthesia, was positioned in the modified flank postion. The patient was carefully secured to the bed, taking care to ensure all pressure points were padded.    The patient was then prepped with ChloraPrep.  The patient was draped sterilely in normal fashion.    A time-out was performed with everyone in the room in agreement of the procedure to be performed.   A 1F foley catheter was placed.  A Veress needle was introduced.   After the abdomen was insufflated to 15 mmHg we then placed the first robotic port under vision.  The 0 degree laparoscopic camera to inspect the abdomen.  There was no visualized injury to the bowel or any vessels.   We then placed the remainder of the robotic trocars under direct vision .  One 8-mm robotic trocar was placed just below the left costal margin.  A second and third 8 mm  robotic trocar was placed in the lower quadrant a hand breadth from the camera and just off of the ASIS.   An  8-mm assistant trocars was placed between the camera and cephalad most robotic port.  The robot was then docked to these trocars.   The colon was reflected medially exposing the retroperitoneum. The ureter below the lower pole of the kidney was identified.  The ureter was dissected free to the renal pelvis.  There was no crossing vessel identified, rather what appeared to be an intrinsic obstruction at the UPJ with some kinking of the proximal ureter.  The renal pelvis was then dissected free.    The renal pelvis was then transected above the UPJ.  A portion of the ureter was removed revealing the intrinsic obstruction and the ureter was spatulated.  Both the renal pelvis and the ureter were now widely patent.  The ureter was then spatulated slightly more and a running anastomosis to the renal pelvis was performed with 2 4-0 vicryl sutures in a running fashion  Prior to completing the anastomosis, a ureteral stent was introduced through into the abdomen through the skin.  This was performed using a 14 gauge angiocath.  A sensor wire was introduced along with the ureteral stent.  This was then advanced antegrade through the anastomosis into the bladder.  The wire was removed and the stent coil was placed in the renal pelvis.    We then re-approximated the Gerota's fascia over the kidney. The 8 mm trocar was closed with a  Marlyne Hummer needle.  All incisions were closed with 4-0 Monocryl suture.  The drain was secured. This marked the end of the procedure.   I was present and scrubbed or was at the console for the entirety of the case.   Note:   This patient has received Exparel intraoperatively. Exparel wristband may be removed at the end of the 96 hour period following Exparel administration. Additional local anesthetics CANNOT be administered LOCALLY at the same infiltration site or at  sites REMOTE to infiltration site within 96 hours following administration of Exparel. Lidocaine  patch is not contraindicated.  Do not place on open wound.  If patient complains of numbness around lips, ringing in ears, chest pain, shortness of breath, seizures or has loss of consciousness; please page house staff.     ESTIMATED BLOOD LOSS: minimal   * No values recorded between 06/18/2024  7:58 AM and 06/18/2024 10:30 AM *   Closed/Suction Drain Universal Right;Lower Abdomen 19 Fr. (Active)     Urethral Catheter Non-latex;Double-lumen (Active)     SPECIMENS:  ID Type Source Tests Collected by Time Destination  1 : RIGHT UPJ Tissue-Pathology Tissue PATHOLOGY - GENERAL / OTHER Gahan, Jeffrey Italy, MD 06/18/2024 [phone]      IMPLANTS:  Implant Name Type Inv. Item Serial No. Manufacturer Lot No. LRB No. Used Action  CLEDA RANDINE RANDOL DOMINICK [id number] - [id number]  STENT, URETERAL INLAY OPTIMA [id number]  CR BARD/MEDICAL DIV [id number] Right 1 Implanted     COMPLICATIONS: none  DISPOSITION: PACU - hemodynamically stable.  ATTESTATION:  FR- Surgery (With Assit. Surgeon) - I personally performed the procedure with the assistance of Max NAtale as an assistant surgeon/assistant at surgery.  A qualified resident was not available as he/she didn't have sufficient training and/or expertise in this particular procedure.

## 2024-06-18 NOTE — Progress Notes (Signed)
 Warfarin Progress Note:   Pharmacy Consulted To Dose Warfarin  Warfarin Dosing History  New Start/Continuation: Continuation Home Dose (if applicable): Warfarin 2.5mg  M,W, F and 5mg  M, T, W, Th, Sat, Sun Warfarin Indication; Goal INR  Indication: Mechanical AVR Goal INR: 2-3 (This target was found from a 2023 reference in patient's chart) Most Recent Dose: HELD since 06/10/24 Bridging Therapy  enoxaparin  90mg  (1mg /kg) subcut twice daily Recent Labs and Monitoring:  Significant Drug-Drug Interactions:None Additional considerations (S&Sxs of bleeding, vitamin K use, recent dose holds, etc.):None Resuming therapy after urologic surgery  Recent Labs: Lab Results  Component Value Date   HGB 11.8 06/18/2024   HGB 13.3 06/02/2024   HCT 34.9 (L) 06/18/2024   HCT 38.8 06/02/2024   PT 11.9 06/18/2024   PT 28.9 (H) 05/26/2024   PT 67.5 (H) 02/11/2024   PLT 190 06/18/2024   PLT 246 06/02/2024   INR 1.0 06/18/2024   INR 2.6 05/26/2024   Assessment: H&H: stable  Platelets: stable  INR today is: 1.0   Typically, a mechanical valve INR target would be 2.5-3.5, but found a reference of a lower target in her EHR Warfarin Dosing Plan  Warfarin Dosing Plan Warfarin 5mg  tonight PT/INR Daily and pharmacy to adjust dose accordingly.  Patient will be followed by pharmacy for labs, toxicity, and efficacy.    CAROLINE GIRARDEAU Ext: [id number]

## 2024-06-18 NOTE — Interval H&P Note (Signed)
 The H&P has been reviewed and the patient has been examined. There is no change in the overall  assessment and no contraindication for surgery.

## 2024-06-19 NOTE — Progress Notes (Signed)
 Case Manager Discharge Summary / Closing Note  Expected Discharge Date & Time: 06/19/2024 at 11 am to 2 pm  Discharge Plan:  Patient is discharging home with routine care and no post-acute services were indicated prior to discharge.    Post-Acute Services Coordinated: No resources indicated at this time  Funding for Discharge Medications: Drug assistance not needed    Transportation: Arrangements: patient arranged Service Arranged: private vehicle  Final ADT:  Final ADT Discharge Disposition: Home Based  Home Based: Home or Self Care                 Second IMM Received: Not indicated (06/19/24 1204)  Final Summary: Per MD, patient is medically stable for discharge. Patient has no further discharge needs.   SHAYLA HOOKER

## 2024-06-19 NOTE — Discharge Summary (Signed)
 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 Please allow 4 hours to prepare appropriate RBCs for transfusion.       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      Anxiety state F41.1   05/09/2015 - Present    Overview Signed 05/09/2015  2:32 PM by Dalene Calton MATSU, RN  Overview:  F/B Dr. Chipper      Vitamin D  deficiency E55.9   05/09/2015 - Present    Essential hypertension (Chronic) I10   05/09/2015 - Present    Lump or mass in breast N63.0   05/09/2015 - Present    SOB (shortness of breath) R06.02   05/09/2015 - Present    Other specified disorders of temporomandibular joint M26.69   05/09/2015 - Present    Major depressive disorder, single episode F32.9   05/09/2015 - Present    Overview Signed 05/09/2015  2:32 PM by Dalene Calton MATSU, RN  Overview:  F/B Dr. 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   01/28/2013 - Present    Renal colic N23   01/28/2013 -  Present    RESOLVED: Acute postoperative pain G89.18   09/18/2022 - 09/30/2022    RESOLVED: Pedal edema R60.0   08/06/2016 - 09/30/2022    RESOLVED: Acute post-operative pain G89.18   03/19/2016 - 04/09/2016    RESOLVED: Postoperative anemia due to acute blood loss D62   03/19/2016 - 04/09/2016    RESOLVED: Pseudoaneurysm of aorta () I71.9   02/19/2016 - 09/30/2022    Overview Signed 02/19/2016  2:37 PM by Fernande Philippe Ee, NP  psuedoaneurysm of the graft to graft anastomosis of the ascending aorta      RESOLVED: Other specified abnormal findings of blood chemistry R79.89   05/09/2015 - 09/30/2022    RESOLVED: Chronic obstructive pulmonary disease (CMS/HHS-HCC) J44.9   05/09/2015 - 09/30/2022    RESOLVED: Abnormal coagulation  profile R79.1   05/09/2015 - 09/30/2022    RESOLVED: SOB (shortness of breath) on exertion R06.02   10/05/2014 - 09/30/2022    RESOLVED: SOB (shortness of breath) on exertion R06.02   03/24/2014 - 09/30/2022    RESOLVED: Edema R60.9   03/24/2014 - 09/30/2022    RESOLVED: Hydronephrosis N13.30   01/28/2013 - 07/23/2016    Overview Signed 05/09/2015  2:32 PM by Dalene Calton MATSU, RN  Overview:  F/B Dr. Ike      RESOLVED: Hx of repair of dissecting thoracic aortic aneurysm, Stanford type A Z98.890, Z86.79   07/03/2011 - 03/25/2022    Overview Signed 05/09/2015  2:32 PM by Dalene Calton MATSU, RN  Overview:  Type A aortic Dissection on 07/03/11.  F/B Dr. Reyes Prude Gaca (Thoracic Surgeon) at [facility].  Has yearly CT Angiograms of the Chest, Abdomen and Pelvis.  Last records available in EMR are form 2012.  Scan revelaed Residual Dissection at the Superior Aspect of the Aortic Arch extending down to the Iliac Arteries.       Consult Orders: None   Surgeries Performed: Procedure(s): RIGHT robotic assisted pyeloplasty CYSTOURETHROSCOPY, WITH INSERTION OF INDWELLING URETERAL STENT (EG,GIBBONS OR DOUBLE-J TYPE)  Brief History of Present Illness: 89F with chronic right flank pain who was referred to Dr. Gildardo for consideration of a pyeloplasty. Underwent MAG3 scan in May 2025 which showed split function 68.3%L/31.5%R with severe right hydronephrosis and parenchymal thinning without hydroureter or crossing vessel on CT scan. She has pertinent past medical history of thoracic and lumbar aortic dissections with extensive aortic reconstruction and stent placement for which she follows with cardiology and takes warfarin. She saw her cardiologist prior to surgery who started her on a lovenox  bridge leading up to surgery with plans to bridge with lovenox  and warfarin after surgery. She also has a history of COPD with shortness of breath on exertion. She uses 2L [place] at nighttime.   Review of Systems A ten point  review of systems was negative except that mentioned in HPI.   Allergies Allergies  Allergen Reactions  . Codeine  Nausea    Can take cough syrup.    Home Medications Prior to Admission medications  Medication Sig Taking? Last Dose  amLODIPine  (NORVASC ) 5 MG tablet take 1 tablet by mouth daily Patient taking differently: Take 5 mg by mouth every morning Yes 06/18/2024 at  3:40 AM  doxycycline  (PERIOSTAT ) 20 MG tablet Take 20 mg by mouth 2 (two) times daily Yes 06/18/2024 at  3:40 AM  DULoxetine  (CYMBALTA ) 20 MG DR capsule Take 40 mg by mouth 2 (two) times daily Yes 06/18/2024 at  3:40 AM  enoxaparin  (LOVENOX ) 100 mg/mL injection syringe Inject 100 mg  subcutaneously as directed Yes 06/17/2024 Evening  FUROsemide  (LASIX ) 40 MG tablet TAKE ONE (1) TABLET BY MOUTH TWO TIMES PER DAY Yes 06/17/2024 Evening  levothyroxine  (SYNTHROID , LEVOTHROID) 125 MCG tablet Take 125 mcg by mouth every morning before breakfast (0630) Yes 06/18/2024 at  3:40 AM  metoprolol  SUCCinate (TOPROL -XL) 25 MG XL tablet TAKE 1 TABLET BY MOUTH DAILY Patient taking differently: Take 25 mg by mouth every morning Yes 06/18/2024 at  3:40 AM  roflumilast (DALIRESP) 500 mcg tablet Take 1 tablet (0.5 mg total) by mouth once daily Patient taking differently: Take 500 mcg by mouth every morning Yes 06/18/2024 at  3:40 AM  TRELEGY ELLIPTA  100-62.5-25 mcg inhaler INHALE ONE PUFF INTO THE LUNGS ONCE DAILY Yes 06/18/2024 at  3:40 AM  warfarin (COUMADIN ) 2.5 MG tablet Take one tablet, 2.5mg , on Mondays, Wednesdays and Fridays Patient taking differently: Take 2.5 mg by mouth once a week -Fridays Yes 06/18/2024 Evening  acetaminophen  (TYLENOL ) 325 MG tablet Take 3 tablets (975 mg total) by mouth every 8 (eight) hours for 7 days    acetaminophen  (TYLENOL ) 500 MG tablet Take 500 mg by mouth as needed for Pain  06/16/2024  albuterol  MDI, PROVENTIL , VENTOLIN , PROAIR , HFA 90 mcg/actuation inhaler INHALE 2 INHALATIONS INTO THE LUNGS EVERY 6 HOURS AS NEEDED FOR  WHEEZING. Patient not taking: Reported on 06/18/2024  Not Taking  aspirin  81 MG EC tablet Take 1 tablet (81 mg total) by mouth once daily Patient taking differently: Take 81 mg by mouth every morning    ipratropium-albuteroL  (DUO-NEB) nebulizer solution Take 3 mLs by nebulization as needed Patient not taking: Reported on 06/18/2024  Not Taking  lidocaine  (SALONPAS) 4 % patch Place 1 patch onto the skin daily for 7 days Apply patch to the most painful area for up to 12 hours in a 24 hours period.    ondansetron  (ZOFRAN -ODT) 4 MG disintegrating tablet Take 1 tablet (4 mg total) by mouth every 8 (eight) hours as needed for Nausea for up to 7 days    oxyBUTYnin (DITROPAN) 5 mg tablet Take 1 tablet (5 mg total) by mouth every 8 (eight) hours as needed (Bladder pain/spasm) for up to 7 days    oxyCODONE  (ROXICODONE ) 5 MG immediate release tablet Take 1 tablet (5 mg total) by mouth every 4 (four) hours as needed (Severe pain) for up to 5 days    phenazopyridine (PYRIDIUM) 100 MG tablet Take 1 tablet (100 mg total) by mouth 3 (three) times daily with meals for 2 days    polyethylene glycol (MIRALAX ) packet Take 17 g by mouth once daily as needed for Constipation Mix in 4-8ounces of fluid prior to taking.  Unknown  polyethylene glycol (MIRALAX ) packet Take 1 packet (17 g total) by mouth once daily for 15 days Mix in 4-8ounces of fluid prior to taking.    sennosides-docusate (SENOKOT-S) 8.6-50 mg tablet Take 2 tablets by mouth 2 (two) times daily as needed for Constipation  Unknown  tamsulosin (FLOMAX) 0.4 mg capsule Take 1 capsule (0.4 mg total) by mouth once daily for 30 days Take 30 minutes after same meal each day.    valACYclovir  (VALTREX ) 1000 MG tablet Take 1,000 mg by mouth as needed (fever blitsters)  Unknown  warfarin (COUMADIN ) 5 MG tablet TAKE ONE TABLET BY MOUTH MONDAYS TUESDAYS THURSDAYS SATURDAYS AND SUNDAYS Patient taking differently: Take 5 mg by mouth as directed TAKE ONE TABLET BY MOUTH MONDAYS  TUESDAYS  Wednesdays THURSDAYS SATURDAYS AND SUNDAYS  06/10/2024    Past  Medical History Past Medical History:  Diagnosis Date  . Acute postoperative pain 09/18/2022  . Anesthesia complication    vocal cord paralysis - 09/2022  . Chronic anticoagulation   . COPD (chronic obstructive pulmonary disease) (CMS/HHS-HCC)   . Depression   . Dissection of descending thoracic aorta (CMS/HHS-HCC) 2010  . Heart murmur   . History of aortic dissection 2010   Type A Dissection extending to the aortic bifurcation  . History of bicuspid aortic valve   . Hypertension   . Hypothyroidism   . Migraines   . PONV (postoperative nausea and vomiting)   . Preoperative evaluation to rule out surgical contraindication 11/14/2023  . 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 Sleep apnea   . Smoker     Past Surgical History Past Surgical History:  Procedure Laterality Date  . Repair Type A Dissection  07/02/2009  . Saint Jude mechanical aortic valve replacement  07/02/2009  . COLONOSCOPY  08/31/2013   hyperplastic polyp  . REPAIR ASCENDING AORTA ANEURYSM N/A 03/19/2016   Procedure: ASCENDING AORTA GRAFT, WITH CARDIOPULMONARY BYPASS, INCLUDES VALVE SUSPENSION, WHEN PERFORMED, ascending pseudoaneurysm, Redo median sternotomy, s/p [place], Hemi-Arch repair;  Surgeon: Reyes Renella Fruits, MD;  Location: [facility];  Service: Cardiothoracic;  Laterality: N/A;  . Foot surgery Right 12/2020  . INSERTION GRAFT AORTA/GREAT VESSELS W/CPB N/A 09/17/2022   Procedure: TRANSVERSE AORTIC ARCH GRAFT, W/ CARDIOPULMONARY BYPASS, W/ PROFOUND HYPOTHERMIA, TOTAL CIRCULATORY ARREST / ISOLATED CEREBRAL PERFUSION WITH REIMPLANTATION OF ARCH VESSEL(S), total arch, redo sternotomy s/p AVR/hemiarch 2010 and ascending repair 2017;  Surgeon: Fruits Reyes Renella, MD;  Location: [facility];  Service: Cardiothoracic;  Laterality: N/A;  . INSERTION GRAFT AORTA/GREAT VESSELS W/CPB N/A 09/17/2022    Procedure: ASCENDING AORTA GRAFT, WITH CARDIOPULMONARY BYPASS, INCLUDES VALVE SUSPENSION, WHEN PERFORMED; FOR AORTIC DISEASE OTHER THAN DISSECTION (EG, ANEURYSM);  Surgeon: Fruits Reyes Renella, MD;  Location: [facility];  Service: Cardiothoracic;  Laterality: N/A;  . REOPERATION ON HEART N/A 09/17/2022   Procedure: REOPERATION, CORONARY ARTERY BYPASS PROCEDURE OR VALVE PROCEDURE, MORE THAN 1 MONTH AFTER ORIGINAL OPERATION (LIST IN ADDITION TO PRIMARY PROCEDURE), third time sternotomy;  Surgeon: Fruits Reyes Renella, MD;  Location: [facility];  Service: Cardiothoracic;  Laterality: N/A;  . EXPOSURE AXILLARY/SUBCLAVIAN ARTERY FOR DELIVERY AORTIC ENDOVASCULAR PROSTHESIS N/A 09/17/2022   Procedure: OPEN AXILLARY/SUBCLAVIAN ARTERY EXPOSURE WITH CREATION CONDUIT FOR DELIVERY OF ENDOVASCULAR PROSTHESIS/ESTABLISHMENT CARDIOPULMONARY BYPASS, BY INFRACLAVICULAR/ SUPRACLAVICULAR INCISION, UNILATERAL;  Surgeon: Fruits Reyes Renella, MD;  Location: [facility];  Service: Cardiothoracic;  Laterality: N/A;  . ENDOVASCULAR PLACEMENT EXTENSION PROSTHESIS REPAIR INFRARENAL ANEURYSM N/A 01/09/2023   Procedure: ENDOVASCULAR REPAIR DESCENDING THORACIC AORTA; INVOLVING COVERAGE LEFT SUBCLAVIAN ARTERY ORIGIN, INITIAL ENDOPROSTHESIS PLUS DESCENDING THORACIC AORTIC EXTENSION, TO LEVEL OF CELIAC ARTERY ORIGIN;  Surgeon: Fruits Reyes Renella, MD;  Location: [facility];  Service: Cardiothoracic;  Laterality: N/A;  . ENDOVASCULAR PLACEMENT EXTENSION PROSTHESIS REPAIR INFRARENAL ANEURYSM N/A 01/09/2023   Procedure: ENDOVASCULAR REPAIR DESCENDING THORACIC AORTA; INVOLVING COVERAGE LEFT SUBCLAVIAN ARTERY ORIGIN, INITIAL ENDOPROSTHESIS PLUS DESCENDING THORACIC AORTIC EXTENSION, TO LEVEL OF CELIAC ARTERY ORIGIN;  Surgeon: Darra Dozier Blunt, MD;  Location: [facility];  Service: General Surgery;  Laterality: N/A;  . BRONCHOSCOPY Bilateral 07/24/2023   Procedure: BRONCHOSCOPY, RIGID OR FLEXIBLE,  INCL FLUOROSCOPIC GUIDANCE, WHEN PERFORMED; WITH BALLOON OCCLUSION, WHEN PERFORMED, ASSESSMENT AIR LEAK, AIRWAY SIZING, AND INSERTION OF BRONCHIAL VALVE, INITIAL LOBE;  Surgeon: Garnell Qualia, Lela Corns, MD;  Location: [facility];  Service:  Pulmonary;  Laterality: Bilateral;  . BRONCHOSCOPY Bilateral 10/23/2023   Procedure: BRONCHOSCOPY, RIGID OR FLEXIBLE, INCLUDING FLUOROSCOPIC GUIDANCE, WHEN PERFORMED; WITH REMOVAL OF BRONCHIAL VALVE(S), INITIAL LOBE;  Surgeon: Garnell Qualia, Lela Corns, MD;  Location: [facility];  Service: Pulmonary;  Laterality: Bilateral;  . THORACOSCOPY WITH THERAPEUTIC WEDGE RESECTION INITIAL UNILATERAL Right 12/08/2023   Procedure: **Robot assisted** THORACOSCOPY, SURGICAL; WITH THERAPEUTIC WEDGE RESECTION (EG, MASS, NODULE), INITIAL UNILATERAL, right;  Surgeon: Tobie Pauline Rives, MD;  Location: [facility];  Service: Cardiothoracic;  Laterality: Right;  . APPENDECTOMY    . CATARACT EXTRACTION Bilateral   . CORNEAL EYE SURGERY     cataract surgery OD 5 years ago  . Hx: [facility] [place] ACD. Vascular Closure     . Surgery on eye      Family History Family History  Problem Relation Name Age of Onset  . Parkinsonism Mother    . Breast cancer Mother    . Coronary Artery Disease (Blocked arteries around heart) Father    . Colon cancer Other grandmother   . Valvular heart disease Other grandmother        valve replacement  . Glaucoma Neg Hx    . Macular degeneration Neg Hx    . Anesthesia problems Neg Hx    . Malignant hypertension Neg Hx    . Malignant hyperthermia Neg Hx    . Pseudochol deficiency Neg Hx    . PONV Neg Hx      Social History Social History   Socioeconomic History  . Marital status: Widowed  . Number of children: 0  Tobacco Use  . Smoking status: Former    Current packs/day: 0.00    Average packs/day: 1 pack/day for 20.0 years (20.0 ttl pk-yrs)    Types: Cigarettes    Start date: 30    Quit  date: 2016    Years since quitting: 9.6    Passive exposure: Never  . Smokeless tobacco: Never  Vaping Use  . Vaping status: Former  . Quit date: 03/13/2017  Substance and Sexual Activity  . Alcohol use: Yes    Alcohol/week: 0.0 standard drinks of alcohol    Comment: occasionally  . Drug use: No  . Sexual activity: Not Currently   Social Drivers of Health   Financial Resource Strain: Low Risk  (03/22/2024)   Overall Financial Resource Strain (CARDIA)   . Difficulty of Paying Living Expenses: Not hard at all  Food Insecurity: No Food Insecurity (03/22/2024)   Hunger Vital Sign   . Worried About Programme researcher, broadcasting/film/video in the Last Year: Never true   . Ran Out of Food in the Last Year: Never true  Transportation Needs: No Transportation Needs (03/22/2024)   PRAPARE - Transportation   . Lack of Transportation (Medical): No   . Lack of Transportation (Non-Medical): No  Physical Activity: Inactive (01/02/2018)   Received from [facility]   Exercise Vital Sign   . Days of Exercise per Week: 0 days   . Minutes of Exercise per Session: 0 min  Housing Stability: Low Risk  (05/26/2024)   Housing Stability Vital Sign   . Unable to Pay for Housing in the Last Year: No   . Number of Times Moved in the Last Year: 0   . Homeless in the Last Year: No    PERTINENT IMAGING:  X-ray abdomen AP portable Result Date: 06/18/2024 AP PORTABLE ABDOMINAL RADIOGRAPH CLINICAL HISTORY: evaluate for stent position, Q62.11 Congenital occlusion of ureteropelvic junction COMPARISON: CT  February 23, 2024 TECHNIQUE: 1 radiograph was submitted at the time of review. Images were obtained in the supine position. FINDINGS: Right-sided double-J ureteral stent visualized. No dilated bowel loops within the imaged field-of-view. Question that the proximal pigtail portion is slightly more distal within the upper collecting system than what is typically expected. Partially visualized endograft material the level of the epigastric  region. Right-sided predominant body wall soft tissue gas present. Surgical drain tip projects at the right upper quadrant abdomen Spondylosis the imaged spine. IMPRESSION:    Right double-J ureteral stent is in place and may be more antegrade in position than what is typically expected. Could consider correlation with CT. Additionally, consider correlation with prior imaging demonstrating the stent, if available, to determine stability of positioning. Electronically Signed by:  Beverley Norfolk, MD, [facility] Electronically Signed on:  06/18/2024 1:03 PM    PERTINENT LABS: Pertinent Recent Labs:  Lab Results  Component Value Date   WBC 12.3 (H) 06/19/2024   HGB 11.0 (L) 06/19/2024   HCT 32.4 (L) 06/19/2024   PLT 222 06/19/2024   Lab Results  Component Value Date   NA 135 06/19/2024   K 4.1 06/19/2024   CL 101 06/19/2024   CO2 26 06/19/2024   BUN 7 06/19/2024   CREATININE 0.8 06/19/2024   GLUCOSE 123 06/19/2024   JP creatinine 1.7 --> 1.0   Discharge Exam:  General: Alert, cooperative, in NAD Neuro: No focal motor or sensory deficits  Cardiovascular: NRRR, hemodynamically stable Respiratory: Non-labored breathing on room air  Abdomen: Soft, appropriately tender to palpation throughout, no rebound or guarding Incision: laparoscopic incisions closed with suture and covered with bandaids  Extremities: Lower extremities with soft compartments bilaterally, without asymmetric swelling, no calf tenderness elicited with palpation bilaterally   Hospital Course: She presented to [facility] on 06/18/2024 to undergo surgical intervention for chronic R UPJO.  She was taken to the operating room by Dr. Gildardo who performed a robotic right pyeloplasty and placement of right ureteral stent. Full details can be found in the operative report. Overall, she tolerated the procedure well without significant difficulty or evident complication and was transferred to the recovery room in stable  condition.  In the immediate postoperative period, she continued to do well.  As she was doing well, she was transitioned to the step-down floor for the remainder of her hospital course.   Her foley catheter was removed at [facility] on 06/19/24 and then a JP creatinine was checked several hours later. Initially, JP Cr was 1.7 but was then rechecked several hours later and was 1.0, which was consistent with serum creatinine.   She was ambulating the halls several times per day with minimal assistance. She had restored GI function and was tolerating a regular diet. All surgical incisions were healing well. Patient did not require PT/OT consults upon admission.   The following problems were addressed during this hospitalization:   # Acute blood loss anemia  Preoperative hgb 13.3. Postoperative hgb 11.8.   Suspected to be secondary to recent operation  -Daily CBCs were obtained which demonstrated stability of hgb without any unexpected fluctuations   Once follow-up arrangements were confirmed, she was felt suitable for discharge to home on 06/19/2024 with the following recommendations:  1) No heavy lifting >10 pounds for 4-6 weeks. Showering is okay. Do not submerge in water such as for bathing or in a pool or lake for 4 weeks.  2) Pain control medications with the following: Tylenol . NSAIDs not  prescribed given she is on warfarin and previously instructed not to take NSAIDs. Oxycodone  given for additional pain control. Lidocaine  patches prescribed. Flomax, ditropan, and pyridium for stent-related symptoms. Zofran  prescribed for nausea.  3) Continue senokot and miralax  for bowel regimen.  4) Follow-up appointment with Dr. Gildardo scheduled as below  5) Resume home medications 6) She will continue lovenox  and warfarin as previously outlined by her cardiologist with plan for INR check on 06/23/24.   Discharge Disposition:  Home  Patient Instructions:    Current Discharge Medication List     START taking  these medications      Instructions  lidocaine  4 % patch Quantity: 7 patch Refills: 0 Stop taking on: June 26, 2024  Commonly known as: SALONPAS Place 1 patch onto the skin daily for 7 days Apply patch to the most painful area for up to 12 hours in a 24 hours period. Last time this was given: 1 patch on June 18, 2024  2:15 PM   ondansetron  4 MG disintegrating tablet Quantity: 10 tablet Refills: 0 Stop taking on: June 26, 2024  Commonly known as: ZOFRAN -ODT Take 1 tablet (4 mg total) by mouth every 8 (eight) hours as needed for Nausea for up to 7 days Last time this was given: Ask your nurse or doctor   oxyBUTYnin 5 mg tablet Quantity: 15 tablet Refills: 0  Commonly known as: DITROPAN Take 1 tablet (5 mg total) by mouth every 8 (eight) hours as needed (Bladder pain/spasm) for up to 7 days Last time this was given: 5 mg on June 19, 2024  8:18 AM   oxyCODONE  5 MG immediate release tablet Quantity: 5 tablet Refills: 0 Stop taking on: June 24, 2024  Commonly known as: ROXICODONE  Take 1 tablet (5 mg total) by mouth every 4 (four) hours as needed (Severe pain) for up to 5 days Last time this was given: 10 mg on June 19, 2024  3:07 AM   phenazopyridine 100 MG tablet Quantity: 6 tablet Refills: 0 Stop taking on: June 21, 2024  Commonly known as: PYRIDIUM Take 1 tablet (100 mg total) by mouth 3 (three) times daily with meals for 2 days   tamsulosin 0.4 mg capsule Quantity: 7 capsule Refills: 0  Commonly known as: FLOMAX Take 1 capsule (0.4 mg total) by mouth once daily for 30 days Take 30 minutes after same meal each day.       These medications have been CHANGED      Instructions  acetaminophen  325 MG tablet Quantity: 45 tablet Refills: 0 Stop taking on: June 26, 2024 What changed: You were already taking a medication with the same name, and this prescription was added. Make sure you understand how and when to take each.  Commonly known as: TylenoL  Take  3 tablets (975 mg total) by mouth every 8 (eight) hours for 7 days Last time this was given: Ask your nurse or doctor   acetaminophen  500 MG tablet Refills: 0 What changed: Another medication with the same name was added. Make sure you understand how and when to take each.  Commonly known as: TYLENOL  Take 500 mg by mouth as needed for Pain Last time this was given: Ask your nurse or doctor   amLODIPine  5 MG tablet Quantity: 90 tablet Refills: 3 What changed: when to take this  Commonly known as: NORVASC  take 1 tablet by mouth daily Doctor's comments: NEED REFILLS Last time this was given: 5 mg on June 19, 2024  8:19 AM  aspirin  81 MG EC tablet Refills: 0 What changed: when to take this  Take 1 tablet (81 mg total) by mouth once daily Last time this was given: 81 mg on June 19, 2024  8:19 AM   metoprolol  SUCCinate 25 MG XL tablet Quantity: 30 tablet Refills: 11 What changed: when to take this  Commonly known as: TOPROL -XL TAKE 1 TABLET BY MOUTH DAILY Doctor's comments: NEED REFILLS Last time this was given: 25 mg on June 19, 2024  8:19 AM   polyethylene glycol packet Refills: 0 What changed: Another medication with the same name was added. Make sure you understand how and when to take each.  Commonly known as: MIRALAX  Take 17 g by mouth once daily as needed for Constipation Mix in 4-8ounces of fluid prior to taking. Last time this was given: Ask your nurse or doctor   polyethylene glycol packet Quantity: 15 packet Refills: 0 Stop taking on: July 04, 2024 What changed: You were already taking a medication with the same name, and this prescription was added. Make sure you understand how and when to take each.  Commonly known as: MIRALAX  Take 1 packet (17 g total) by mouth once daily for 15 days Mix in 4-8ounces of fluid prior to taking. Last time this was given: Ask your nurse or doctor   roflumilast 500 mcg tablet Quantity: 30 tablet Refills: 3 What changed:  when to take this  Commonly known as: DALIRESP Take 1 tablet (0.5 mg total) by mouth once daily Doctor's comments: Pre Auth submitted through Covermymeds for Roflumilast - Approved - Eff: 02/26/2024 - 02/25/2025 Last time this was given: 0.5 mg on June 19, 2024  8:18 AM   warfarin 5 MG tablet Quantity: 30 tablet Refills: 0 What changed:  how much to take how to take this when to take this reasons to take this additional instructions  Commonly known as: COUMADIN  Take as directed. If you are unsure how to take this medication, talk to your nurse or doctor. Original instructions: TAKE ONE TABLET BY MOUTH MONDAYS TUESDAYS THURSDAYS SATURDAYS AND SUNDAYS Doctor's comments: PT NEEDS A REFILL Last time this was given: Ask your nurse or doctor   warfarin 2.5 MG tablet Quantity: 40 tablet Refills: 0 What changed:  how much to take how to take this when to take this additional instructions  Commonly known as: COUMADIN  Take as directed. If you are unsure how to take this medication, talk to your nurse or doctor. Original instructions: Take one tablet, 2.5mg , on Mondays, Wednesdays and Fridays Last time this was given: Ask your nurse or doctor       CONTINUE taking these medications      Instructions  doxycycline  20 MG tablet Refills: 0  Commonly known as: PERIOSTAT  Take 20 mg by mouth 2 (two) times daily   DULoxetine  20 MG DR capsule Refills: 0  Commonly known as: CYMBALTA  Take 40 mg by mouth 2 (two) times daily Last time this was given: 40 mg on June 19, 2024  8:19 AM   enoxaparin  100 mg/mL injection syringe Refills: 0  Commonly known as: LOVENOX  Inject 100 mg subcutaneously as directed Last time this was given: 90 mg on June 19, 2024  8:18 AM   FUROsemide  40 MG tablet Quantity: 60 tablet Refills: 2  Commonly known as: LASIX  TAKE ONE (1) TABLET BY MOUTH TWO TIMES PER DAY Doctor's comments: FOR NEXT FILL Last time this was given: 40 mg on June 19, 2024  8:19 AM  levothyroxine  125 MCG tablet Refills: 0  Commonly known as: SYNTHROID  Take 125 mcg by mouth every morning before breakfast (0630) Last time this was given: 125 mcg on June 19, 2024  6:32 AM   sennosides-docusate 8.6-50 mg tablet Refills: 0  Commonly known as: SENOKOT-S Take 2 tablets by mouth 2 (two) times daily as needed for Constipation Last time this was given: 2 tablets on June 19, 2024  8:18 AM   TRELEGY ELLIPTA  100-62.5-25 mcg inhaler Quantity: 60 each Refills: 1 Generic drug: fluticasone -umeclidinium-vilanterol  INHALE ONE PUFF INTO THE LUNGS ONCE DAILY Doctor's comments: FOR NEXT FILL Last time this was given: 1 Puff on June 19, 2024  8:17 AM   valACYclovir  1000 MG tablet Refills: 0  Commonly known as: VALTREX  Take 1,000 mg by mouth as needed (fever blitsters)       ASK your doctor about these medications      Instructions  albuterol  MDI (PROVENTIL , VENTOLIN , PROAIR ) HFA 90 mcg/actuation inhaler Quantity: 18 g Refills: 3  INHALE 2 INHALATIONS INTO THE LUNGS EVERY 6 HOURS AS NEEDED FOR WHEEZING. Doctor's comments: PT OUT OF REFILLS - Please dispense the generic or branded medication that is covered by plan at lowest cost to patient. Last time this was given: 4 inhalations on June 18, 2024 10:17 AM   amoxicillin -clavulanate 875-125 mg tablet Quantity: 6 tablet Refills: 0 Stop taking on: June 18, 2024 Ask about: Should I take this medication?  Commonly known as: AUGMENTIN Take 1 tablet (875 mg total) by mouth every 12 (twelve) hours for 3 days For: Prophylaxis   ipratropium-albuteroL  nebulizer solution Refills: 0  Commonly known as: DUO-NEB Take 3 mLs by nebulization as needed        Future Appointments  Date Time Provider Department Center  06/23/2024  4:00 PM [facility] NURSE POD B [facility]  06/23/2024  4:00 PM [facility]  07/13/2024 11:00 AM Gahan, Jeffrey [place], MD [facility]ONC [facility]  08/25/2024  3:30 PM  Paraschos, Marsa, MD [facility]    Please do not hesitate to contact the functional pager if you have questions or concerns.  Geofm Guys, MD Urology PGY3 Urology Functional Pager: [phone]

## 2024-06-21 ENCOUNTER — Telehealth: Payer: Self-pay

## 2024-06-21 NOTE — Transitions of Care (Post Inpatient/ED Visit) (Signed)
   06/21/2024  Name: Nancy [name] MRN: [id number] DOB: 1961-02-04  Today's TOC FU Call Status: Today's TOC FU Call Status:: Unsuccessful Call (1st Attempt) Unsuccessful Call (1st Attempt) Date: 06/21/24  Attempted to reach the patient regarding the most recent Inpatient/ED visit. Left a HIPAA approved voicemail message to phone number provided in demographics per DPR.    Follow Up Plan: Additional outreach attempts will be made to reach the patient to complete the Transitions of Care (Post Inpatient/ED visit) call.   Richerd Fish, RN, BSN, CCM [facility], [facility] RN Care Manager Direct Dial: [phone]

## 2024-06-22 ENCOUNTER — Telehealth: Payer: Self-pay

## 2024-06-22 NOTE — Transitions of Care (Post Inpatient/ED Visit) (Signed)
   06/22/2024  Name: ROCKY RISHEL MRN: [id number] DOB: 11-04-1961  Today's TOC FU Call Status: Today's TOC FU Call Status:: Successful TOC FU Call Completed TOC FU Call Complete Date: 06/22/24 Patient's Name and Date of Birth confirmed.  Transition Care Management Follow-up Telephone Call Date of Discharge: 06/19/24 Discharge Facility: Other ([facility]) Name of Other ([facility]) Discharge Facility: [facility] Type of Discharge: Inpatient Admission Primary Inpatient Discharge Diagnosis:: Hydronephrosis How have you been since you were released from the hospital?: Same (I'm okay)  Items Reviewed: Did you receive and understand the discharge instructions provided?: Yes Medications obtained,verified, and reconciled?: No (I don't have time to do it, everything I received from [facility] I have already taken) Medications Not Reviewed Reasons:: Other: (Declines review)  Medications Reviewed Today:  Patient declines review Medications Reviewed Today   Medications were not reviewed in this encounter      Follow up appointments reviewed: PCP Follow-up appointment confirmed?: No (Patient states, I will make the appointment, I don't have my scheduled in front of me  Offered to assist with appointment and patient declines.) Do you understand care options if your condition(s) worsen?: Yes-patient verbalized understanding    Richerd Fish, RN, BSN, CCM [facility], [facility] RN Care Manager Direct Dial: [phone]

## 2024-06-23 ENCOUNTER — Ambulatory Visit

## 2024-06-24 ENCOUNTER — Ambulatory Visit

## 2024-06-27 ENCOUNTER — Emergency Department
Admission: EM | Admit: 2024-06-27 | Discharge: 2024-06-27 | Disposition: A | Discharge status: Home / Self Care | Source: Ambulatory Visit | Attending: Emergency Medicine | Admitting: Emergency Medicine

## 2024-06-27 ENCOUNTER — Emergency Department

## 2024-06-27 ENCOUNTER — Other Ambulatory Visit: Payer: Self-pay

## 2024-06-27 DIAGNOSIS — R109 Unspecified abdominal pain: Secondary | ICD-10-CM | POA: Diagnosis not present

## 2024-06-27 DIAGNOSIS — R791 Abnormal coagulation profile: Secondary | ICD-10-CM | POA: Diagnosis not present

## 2024-06-27 DIAGNOSIS — Z7901 Long term (current) use of anticoagulants: Secondary | ICD-10-CM | POA: Diagnosis not present

## 2024-06-27 DIAGNOSIS — K668 Other specified disorders of peritoneum: Secondary | ICD-10-CM | POA: Insufficient documentation

## 2024-06-27 DIAGNOSIS — I10 Essential (primary) hypertension: Secondary | ICD-10-CM | POA: Insufficient documentation

## 2024-06-27 DIAGNOSIS — J449 Chronic obstructive pulmonary disease, unspecified: Secondary | ICD-10-CM | POA: Insufficient documentation

## 2024-06-27 DIAGNOSIS — J439 Emphysema, unspecified: Secondary | ICD-10-CM | POA: Diagnosis not present

## 2024-06-27 LAB — COMPREHENSIVE METABOLIC PANEL WITH GFR
ALT: 24 U/L (ref 0–44)
AST: 25 U/L (ref 15–41)
Albumin: 4 g/dL (ref 3.5–5.0)
Alkaline Phosphatase: 99 U/L (ref 38–126)
Anion gap: 12 (ref 5–15)
BUN: 13 mg/dL (ref 8–23)
CO2: 26 mmol/L (ref 22–32)
Calcium: 9.3 mg/dL (ref 8.9–10.3)
Chloride: 100 mmol/L (ref 98–111)
Creatinine, Ser: 0.78 mg/dL (ref 0.44–1.00)
GFR, Estimated: >60 mL/min (ref >60)
Glucose, Bld: 117 mg/dL — ABNORMAL HIGH (ref 70–99)
Potassium: 3.4 mmol/L — ABNORMAL LOW (ref 3.5–5.1)
Sodium: 138 mmol/L (ref 135–145)
Total Bilirubin: 1.3 mg/dL — ABNORMAL HIGH (ref 0.0–1.2)
Total Protein: 7.4 g/dL (ref 6.5–8.1)

## 2024-06-27 LAB — URINALYSIS, ROUTINE W REFLEX MICROSCOPIC
Bacteria, UA: NONE SEEN
Bilirubin Urine: NEGATIVE
Glucose, UA: NEGATIVE mg/dL
Ketones, ur: NEGATIVE mg/dL
Nitrite: NEGATIVE
Protein, ur: NEGATIVE mg/dL
Specific Gravity, Urine: 1.009 (ref 1.005–1.030)
pH: 5 (ref 5.0–8.0)

## 2024-06-27 LAB — CBC
HCT: 36.8 % (ref 36.0–46.0)
Hemoglobin: 12.3 g/dL (ref 12.0–15.0)
MCH: 29.6 pg (ref 26.0–34.0)
MCHC: 33.4 g/dL (ref 30.0–36.0)
MCV: 88.5 fL (ref 80.0–100.0)
Platelets: 342 K/uL (ref 150–400)
RBC: 4.16 MIL/uL (ref 3.87–5.11)
RDW: 13.5 % (ref 11.5–15.5)
WBC: 9.5 K/uL (ref 4.0–10.5)
nRBC: 0 % (ref 0.0–0.2)

## 2024-06-27 LAB — LACTIC ACID, PLASMA: Lactic Acid, Venous: 1.4 mmol/L (ref 0.5–1.9)

## 2024-06-27 LAB — PROTIME-INR
INR: 1.7 — ABNORMAL HIGH (ref 0.8–1.2)
Prothrombin Time: 21.2 s — ABNORMAL HIGH (ref 11.4–15.2)

## 2024-06-27 LAB — LIPASE, BLOOD: Lipase: 26 U/L (ref 11–51)

## 2024-06-27 MED ORDER — LACTATED RINGERS IV BOLUS
1000.0000 mL | Freq: Once | INTRAVENOUS | Status: AC
  Administered 2024-06-27: 1000 mL via INTRAVENOUS

## 2024-06-27 MED ORDER — OXYCODONE HCL 5 MG PO TABS: 5.0000 mg | ORAL_TABLET | Freq: Four times a day (QID) | ORAL | 0 refills | Status: AC | PRN

## 2024-06-27 MED ORDER — HYDROMORPHONE HCL 1 MG/ML IJ SOLN
0.5000 mg | Freq: Once | INTRAMUSCULAR | Status: AC
  Administered 2024-06-27: 0.5 mg via INTRAVENOUS
  Filled 2024-06-27: qty 0.5

## 2024-06-27 MED ORDER — IOHEXOL 350 MG/ML SOLN
100.0000 mL | Freq: Once | INTRAVENOUS | Status: AC | PRN
Start: 2024-06-27 — End: 2024-06-27
  Administered 2024-06-27: 100 mL via INTRAVENOUS

## 2024-06-27 MED ORDER — OXYCODONE HCL 5 MG PO TABS
5.0000 mg | ORAL_TABLET | Freq: Once | ORAL | Status: AC
  Administered 2024-06-27: 5 mg via ORAL
  Filled 2024-06-27: qty 1

## 2024-06-27 NOTE — ED Triage Notes (Signed)
 Pt arrives with ABD pain and right sided flank pain that worsened 4-5 days ago. 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 Pt had stent placed during surgery also. Pt does take coumadin .

## 2024-06-27 NOTE — ED Provider Notes (Signed)
 [facility] Provider Note    Event Date/Time   First MD Initiated Contact with Patient 06/27/24 1504     (approximate)   History   Abdominal Pain and Flank Pain   HPI  Nancy [name] is a 63 y.o. female with history of COPD uses 2L nasal cannula nightly, hypertension, history of aortic dissection status post aortic reconstruction and stent placement, on Coumadin , presenting with abdominal pain and right flank pain.  Also with right lower back aching.  States that started several days ago and has worsened over time.  Associated with some nausea.  States that she is having normal bowel movements without melena or blood.  Denies any dysuria or hematuria.  States that the stent is still in place.  Denies any fever, no chest pain or shortness of breath.  States pain is different from when she had her aortic dissections.    On independent chart review, she was admitted in early August to urology at 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  Physical Exam   Triage Vital Signs: ED Triage Vitals [06/27/24 1413]  Encounter Vitals Group     BP 129/70     Girls Systolic BP Percentile      Girls Diastolic BP Percentile      Boys Systolic BP Percentile      Boys Diastolic BP Percentile      Pulse Rate 84     Resp 16     Temp 98.5 F (36.9 C)     Temp Source Oral     SpO2 96 %     Weight      Height      Head Circumference      Peak Flow      Pain Score      Pain Loc      Pain Education      Exclude from Growth Chart     Most recent vital signs: Vitals:   06/27/24 1700 06/27/24 1730  BP: 135/73 106/79  Pulse: 82 86  Resp: 11 16  Temp:    SpO2: 97% 100%     General: Awake, no distress.  CV:  Good peripheral perfusion.  Resp:  Normal effort.  No tachypnea or respiratory distress Abd:  No distention.  Soft, tender to her  right abdomen, her surgical sites are in place with some surrounding ecchymoses, there is no erythema or induration, the wounds are clean dry and intact, no purulent drainage Other:  She does have right flank pain as well as right lower back pain without CVA tenderness bilaterally.  No upper thoracic tenderness on palpation   ED Results / Procedures / Treatments   Labs (all labs ordered are listed, but only abnormal results are displayed) Labs Reviewed  COMPREHENSIVE METABOLIC PANEL WITH GFR - Abnormal; Notable for the following components:      Result Value   Potassium 3.4 (*)    Glucose, Bld 117 (*)    Total Bilirubin 1.3 (*)    All other components within normal limits  URINALYSIS, ROUTINE W REFLEX MICROSCOPIC - Abnormal; Notable for the following components:   Color, Urine YELLOW (*)    APPearance CLEAR (*)    Hgb urine dipstick SMALL (*)    Leukocytes,Ua TRACE (*)    All other components within normal limits  PROTIME-INR - Abnormal; Notable for the following components:  Prothrombin Time 21.2 (*)    INR 1.7 (*)    All other components within normal limits  LIPASE, BLOOD  CBC  LACTIC ACID, PLASMA     EKG  EKG shows, sinus tachycardia, rate 105, normal QS, normal QTc, no obvious ischemic ST elevation, T wave inversion to aVL, inversion ST compared prior   RADIOLOGY On my independent interpretation, CT abdomen shows free air in the abdomen   PROCEDURES:  Critical Care performed: No  Procedures   MEDICATIONS ORDERED IN ED: Medications  lactated ringers  bolus 1,000 mL (0 mLs Intravenous Stopped 06/27/24 1742)  HYDROmorphone  (DILAUDID ) injection 0.5 mg (0.5 mg Intravenous Given 06/27/24 1525)  iohexol  (OMNIPAQUE ) 350 MG/ML injection 100 mL (100 mLs Intravenous Contrast Given 06/27/24 1554)  oxyCODONE  (Oxy IR/ROXICODONE ) immediate release tablet 5 mg (5 mg Oral Given 06/27/24 1749)     IMPRESSION / MDM / ASSESSMENT AND PLAN / ED COURSE  I reviewed the triage vital  signs and the nursing notes.                              Differential diagnosis includes, but is not limited to, infection, stent migration, colitis, diverticulitis, constipation, UTI, pyelonephritis, given her history also did consider dissection although patient states that the pain is not similar, no numbness tingling is not overtly hypertensive.  Get labs, CTA abdomen pelvis, UA, give her some fluids as well as IV pain meds.  Patient's presentation is most consistent with acute presentation with potential threat to life or bodily function.  Independent interpretation of labs and imaging below.  Clinical course as below, consult urology from [facility], says this is pretty typical postsurgery for her to have pain in the area as well as air in the collecting duct and intra-abdominally.  Recommended outpatient follow-up with them in clinic.  Give her p.o. oxycodone , pain is improving.  Considered but no indication for inpatient admission at this time, she safe for outpatient management.  Discussed with patient about imaging lab results including incidental findings, also discussed with her about her subtherapeutic INR, her goal should be 2.5-3.5, patient states that this INR is increasing compared to prior, she just got off her Lovenox  bridge and they are slowly titrating up her Coumadin  because larger changes have resulted in difficulty in keeping her in her goal in the past.  Instructed her to let her INR clinic know about the level here and to see if they need any changes, instructed her to also call her urologist office in the morning to schedule follow-up this week.  Strict precautions given.  Discharge.    Clinical Course as of 06/27/24 1759  Sun Jun 27, 2024  1656 CT Angio Abd/Pel W and/or Wo Contrast IMPRESSION: VASCULAR  1. Aortic dissection extending into the common iliac arteries bilaterally, external iliac artery on the left, and bilateral renal arteries, unchanged. 2. Endovascular stent  in the distal thoracic aorta.  NON-VASCULAR  1. Intra-abdominal free air and air in the anterior and right lateral abdominal wall, may be due to recent procedure. Clinical correlation is recommended. 2. Chronic moderate to severe hydronephrosis on the right with interval placement of right ureteral stent. A small amount air is seen in the collecting system which may be due to recent procedure, however superimposed infection cannot be excluded. 3. Nonobstructive right renal calculi. 4. Stable complex lesion in the mid left kidney, possible proteinaceous or hemorrhagic cyst. Nonemergent MRI with contrast is recommended  for further characterization on follow-up.   [TT]  1700 Received a call from radiology, states that CT shows free air and she was concerned that there is still free air in patient's abdomen a week out from surgery, recommended to contacting urology.  Patient's intra-abdominal dissection is chronic.  She states no obvious bowel perforation. [TT]  1709 Independent review of labs, electrolytes not severely deranged, creatinine is normal, no leukocytosis, UA shows trace leuk esterase, 6-10 WBCs but no bacteria, she is not having urinary symptoms, no fever or leukocytosis, doubt this is UTI at this time. lipase is normal, lactic acid is normal. [TT]  1747 Consulted at [facility] urology, discussed imaging and lab results, states that the timeline is consistent for her to still have air in her collecting system as well as in her abdomen.  States that the pain that she is experiencing is pretty typical postsurgery for them.  States that she can follow-up with them in clinic outpatient.  Agreed with holding off antibiotics and that the urine is not consistent with UTI. [TT]    Clinical Course User Index [TT] Waymond Lorelle Cummins, MD     FINAL CLINICAL IMPRESSION(S) / ED DIAGNOSES   Final diagnoses:  Subtherapeutic international normalized ratio (INR)  Abdominal pain, unspecified abdominal  location  Surgical pneumoperitoneum     Rx / DC Orders   ED Discharge Orders          Ordered    oxyCODONE  (OXY IR/ROXICODONE ) 5 MG immediate release tablet  Every 6 hours PRN        06/27/24 1757             Note:  This document was prepared using Dragon voice recognition software and may include unintentional dictation errors.    Waymond Lorelle Cummins, MD 06/27/24 1800

## 2024-06-27 NOTE — ED Notes (Signed)
 Pt taken to CT.

## 2024-06-27 NOTE — Discharge Instructions (Signed)
 Please make sure to follow-up with your INR clinic, let them know that you were subtherapeutic at 1.7 today and to see if you need any medication changes to your Coumadin .  Please ensure to follow-up with your urologist this week to get reassessed.  Please call their office on Monday.

## 2024-06-30 ENCOUNTER — Ambulatory Visit

## 2024-07-01 ENCOUNTER — Ambulatory Visit

## 2024-07-07 ENCOUNTER — Ambulatory Visit

## 2024-07-07 DIAGNOSIS — J449 Chronic obstructive pulmonary disease, unspecified: Secondary | ICD-10-CM

## 2024-07-07 NOTE — Progress Notes (Signed)
 Pulmonary Individual Treatment Plan  Patient Details  Name: Nancy [name] MRN: [id number] Date of Birth: 05-09-1961 Referring Provider:   Flowsheet Row Pulmonary Rehab from 02/11/2024 in [facility] Cardiac and Pulmonary Rehab  Referring Provider Dr. Lavelle Servant    Initial Encounter Date:  Flowsheet Row Pulmonary Rehab from 02/11/2024 in [facility] Cardiac and Pulmonary Rehab  Date 02/11/24    Visit Diagnosis: Stage 3 severe COPD by GOLD classification (HCC)  Patient's Home Medications on Admission:  Current Outpatient Medications:    acetaminophen  (TYLENOL ) 500 MG tablet, Take 500 mg by mouth every 4 (four) hours as needed., Disp: , Rfl:    albuterol  (VENTOLIN  HFA) 108 (90 Base) MCG/ACT inhaler, SMARTSIG:2 Inhalation Via Inhaler Every 6 Hours PRN, Disp: , Rfl:    amLODipine  (NORVASC ) 5 MG tablet, Take 5 mg by mouth daily., Disp: , Rfl:    benzonatate  (TESSALON ) 200 MG capsule, Take 1 capsule (200 mg total) by mouth 2 (two) times daily as needed for cough., Disp: 20 capsule, Rfl: 0   diclofenac  Sodium (VOLTAREN ) 1 % GEL, Apply 2 g topically 4 (four) times daily., Disp: 100 g, Rfl: 0   doxycycline  (PERIOSTAT ) 20 MG tablet, Take one tab po BID with food., Disp: 60 tablet, Rfl: 11   DULoxetine  (CYMBALTA ) 20 MG capsule, Take 2 capsules (40 mg total) by mouth 2 (two) times daily., Disp: 360 capsule, Rfl: 1   enoxaparin  (LOVENOX ) 100 MG/ML injection, Inject into the skin. (Patient not taking: Reported on 12/24/2023), Disp: , Rfl:    enoxaparin  (LOVENOX ) 40 MG/0.4ML injection, Inject 90 mg into the skin daily. (Patient not taking: Reported on 12/24/2023), Disp: , Rfl:    Fluticasone -Umeclidin-Vilant (TRELEGY ELLIPTA ) 100-62.5-25 MCG/ACT AEPB, Inhale into the lungs., Disp: , Rfl:    furosemide  (LASIX ) 40 MG tablet, Take 40 mg by mouth 2 (two) times daily. (Patient not taking: Reported on 12/24/2023), Disp: , Rfl:    furosemide  (LASIX ) 40 MG tablet, Take 1 tablet by mouth 2 (two) times daily., Disp: , Rfl:     ipratropium-albuterol  (DUONEB) 0.5-2.5 (3) MG/3ML SOLN, Inhale into the lungs., Disp: , Rfl:    levofloxacin  (LEVAQUIN ) 750 MG tablet, Take 1 tablet (750 mg total) by mouth daily., Disp: 7 tablet, Rfl: 0   levothyroxine  (SYNTHROID ) 125 MCG tablet, Take 1 tablet (125 mcg total) by mouth daily before breakfast., Disp: 90 tablet, Rfl: 3   lidocaine  (LIDODERM ) 5 %, Place 1 patch onto the skin every 12 (twelve) hours. Remove & Discard patch within 12 hours or as directed by MD, Disp: 15 patch, Rfl: 0   metoprolol  succinate (TOPROL -XL) 25 MG 24 hr tablet, Take 25 mg by mouth daily., Disp: , Rfl:    Na Sulfate-K Sulfate-Mg Sulfate concentrate 17.5-3.13-1.6 GM/177ML SOLN, , Disp: , Rfl:    ondansetron  (ZOFRAN  ODT) 4 MG disintegrating tablet, Take 1 tablet (4 mg total) by mouth every 8 (eight) hours as needed for nausea or vomiting., Disp: 20 tablet, Rfl: 2   oxyCODONE  (OXY IR/ROXICODONE ) 5 MG immediate release tablet, Take 1 tablet (5 mg total) by mouth every 6 (six) hours as needed for moderate pain (pain score 4-6)., Disp: 8 tablet, Rfl: 0   polyethylene glycol (MIRALAX  / GLYCOLAX ) 17 g packet, Take 17 g by mouth daily as needed., Disp: , Rfl:    senna-docusate (SENOKOT-S) 8.6-50 MG tablet, Take 1 tablet by mouth 2 (two) times daily as needed for mild constipation., Disp: 20 tablet, Rfl: 0   TRELEGY ELLIPTA  100-62.5-25 MCG/ACT AEPB, Inhale into the  lungs., Disp: , Rfl:    valACYclovir  (VALTREX ) 1000 MG tablet, Take 1 tablet (1,000 mg total) by mouth daily., Disp: 6 tablet, Rfl: 0   warfarin (COUMADIN ) 5 MG tablet, Take 5 mg by mouth every evening. Per cardiologist: patient supposed to take 7.5 mg 9/24 and 9/25 evening for low INR then resume 5 mg nightly  Goal INR 2.5-3.5, Disp: , Rfl:   Current Facility-Administered Medications:    ipratropium-albuterol  (DUONEB) 0.5-2.5 (3) MG/3ML nebulizer solution 3 mL, 3 mL, Nebulization, Once, Pollak, Adriana M, PA-C  Past Medical History: Past Medical History:   Diagnosis Date   Aortic dissection (HCC) 2010   COPD (chronic obstructive pulmonary disease) (HCC)    Depression    Hypertension    Hypothyroidism    Pneumonia    PONV (postoperative nausea and vomiting)    Rosacea    Seasonal allergies    Sleep apnea    Does not use CPAP    Tobacco Use: Social History   Tobacco Use  Smoking Status Former   Current packs/day: 0.00   Average packs/day: 0.5 packs/day for 30.0 years (15.0 ttl pk-yrs)   Types: Cigarettes   Start date: 03/11/1986   Quit date: 03/11/2016   Years since quitting: 8.3  Smokeless Tobacco Never  Tobacco Comments   patient states  that she is working at her own pace to quit smoking    Labs: Review Flowsheet  More data may exist      Latest Ref Rng & Units 07/02/2009 08/29/2015 01/05/2018 09/29/2020 08/20/2023  Labs for ITP Cardiac and Pulmonary Rehab  Cholestrol 100 - 199 mg/dL - 823  814  832  837   LDL (calc) 0 - 99 mg/dL - 896  888  898  894   HDL-C >39 mg/dL - 53  50  45  38   Trlycerides 0 - 149 mg/dL - 897  880  883  894   Hemoglobin A1c 4.8 - 5.6 % - - - - 4.8   TCO2 0 - 100 mmol/L 22  - - - -     Pulmonary Assessment Scores:  Pulmonary Assessment Scores     Row Name 02/11/24 1537         mMRC Score   mMRC Score 3        UCSD: Self-administered rating of dyspnea associated with activities of daily living (ADLs) 6-point scale (0 = not at all to 5 = maximal or unable to do because of breathlessness)  Scoring Scores range from 0 to 120.  Minimally important difference is 5 units  CAT: CAT can identify the health impairment of COPD patients and is better correlated with disease progression.  CAT has a scoring range of zero to 40. The CAT score is classified into four groups of low (less than 10), medium (10 - 20), high (21-30) and very high (31-40) based on the impact level of disease on health status. A CAT score over 10 suggests significant symptoms.  A worsening CAT score could be explained by  an exacerbation, poor medication adherence, poor inhaler technique, or progression of COPD or comorbid conditions.  CAT MCID is 2 points  mMRC: mMRC (Modified Medical Research Council) Dyspnea Scale is used to assess the degree of baseline functional disability in patients of respiratory disease due to dyspnea. No minimal important difference is established. A decrease in score of 1 point or greater is considered a positive change.   Pulmonary Function Assessment:   Exercise Target Goals: Exercise Program  Goal: Individual exercise prescription set using results from initial 6 min walk test and THRR while considering  patient's activity barriers and safety.   Exercise Prescription Goal: Initial exercise prescription builds to 30-45 minutes a day of aerobic activity, 2-3 days per week.  Home exercise guidelines will be given to patient during program as part of exercise prescription that the participant will acknowledge.  Education: Aerobic Exercise: - Group verbal and visual presentation on the components of exercise prescription. Introduces F.I.T.T principle from ACSM for exercise prescriptions.  Reviews F.I.T.T. principles of aerobic exercise including progression. Written material provided at class time. Flowsheet Row Pulmonary Rehab from 01/29/2023 in [facility] Cardiac and Pulmonary Rehab  Date 11/27/22  Educator KW  Instruction Review Code 1- [employer] Understanding    Education: Resistance Exercise: - Group verbal and visual presentation on the components of exercise prescription. Introduces F.I.T.T principle from ACSM for exercise prescriptions  Reviews F.I.T.T. principles of resistance exercise including progression. Written material provided at class time. Flowsheet Row Pulmonary Rehab from 01/29/2023 in [facility] Cardiac and Pulmonary Rehab  Date 12/04/22  Educator KW  Instruction Review Code 1- [employer] Understanding     Education: Exercise & Equipment Safety: - Individual verbal  instruction and demonstration of equipment use and safety with use of the equipment. Flowsheet Row Pulmonary Rehab from 02/11/2024 in [facility] Cardiac and Pulmonary Rehab  Date 02/11/24  Educator [facility]  Instruction Review Code 1- Verbalizes Understanding    Education: Exercise Physiology & General Exercise Guidelines: - Group verbal and written instruction with models to review the exercise physiology of the cardiovascular system and associated critical values. Provides general exercise guidelines with specific guidelines to those with heart or lung disease.    Education: Flexibility, Balance, Mind/Body Relaxation: - Group verbal and visual presentation with interactive activity on the components of exercise prescription. Introduces F.I.T.T principle from ACSM for exercise prescriptions. Reviews F.I.T.T. principles of flexibility and balance exercise training including progression. Also discusses the mind body connection.  Reviews various relaxation techniques to help reduce and manage stress (i.e. Deep breathing, progressive muscle relaxation, and visualization). Balance handout provided to take home. Written material provided at class time. Flowsheet Row Pulmonary Rehab from 01/29/2023 in [facility] Cardiac and Pulmonary Rehab  Date 12/11/22  Educator KW  Instruction Review Code 1- Verbalizes Understanding    Activity Barriers & Risk Stratification:  Activity Barriers & Cardiac Risk Stratification - 02/11/24 1530       Activity Barriers & Cardiac Risk Stratification   Activity Barriers None          6 Minute Walk:  6 Minute Walk     Row Name 02/11/24 1528         6 Minute Walk   Phase Initial     Distance 755 feet     Walk Time 5.17 minutes     # of Rest Breaks 2     MPH 1.66     METS 2.7     RPE 12     Perceived Dyspnea  4     VO2 Peak 9.56     Symptoms Yes (comment)     Comments SOB     Resting HR 98 bpm     Resting BP 122/58     Resting Oxygen  Saturation  97 %     Exercise  Oxygen  Saturation  during 6 min walk 95 %     Max Ex. HR 112 bpm     Max Ex. BP 162/64  Interval HR   1 Minute HR 102     2 Minute HR 103     3 Minute HR 107     4 Minute HR 110     5 Minute HR 106     6 Minute HR 112     2 Minute Post HR 106     Interval Heart Rate? Yes       Interval Oxygen    Interval Oxygen ? Yes     Baseline Oxygen  Saturation % 97 %     1 Minute Oxygen  Saturation % 98 %     1 Minute Liters of Oxygen  2 L     2 Minute Oxygen  Saturation % 97 %     2 Minute Liters of Oxygen  2 L     3 Minute Oxygen  Saturation % 96 %     3 Minute Liters of Oxygen  2 L     4 Minute Oxygen  Saturation % 96 %     4 Minute Liters of Oxygen  2 L     5 Minute Oxygen  Saturation % 97 %     5 Minute Liters of Oxygen  2 L     6 Minute Oxygen  Saturation % 95 %     6 Minute Liters of Oxygen  2 L     2 Minute Post Oxygen  Saturation % 98 %     2 Minute Post Liters of Oxygen  2 L       Oxygen  Initial Assessment:  Oxygen  Initial Assessment - 02/11/24 1537       Home Oxygen    Home Oxygen  Device Portable Concentrator;Home Concentrator    Sleep Oxygen  Prescription Continuous    Liters per minute 2    Home Exercise Oxygen  Prescription Continuous    Liters per minute 2    Home Resting Oxygen  Prescription None    Compliance with Home Oxygen  Use Yes      Initial 6 min Walk   Oxygen  Used Continuous    Liters per minute 2      Program Oxygen  Prescription   Program Oxygen  Prescription Continuous    Liters per minute 2      Intervention   Short Term Goals To learn and exhibit compliance with exercise, home and travel O2 prescription;To learn and understand importance of monitoring SPO2 with pulse oximeter and demonstrate accurate use of the pulse oximeter.;To learn and demonstrate proper pursed lip breathing techniques or other breathing techniques. ;To learn and demonstrate proper use of respiratory medications;To learn and understand importance of maintaining oxygen  saturations>88%    Long   Term Goals Exhibits compliance with exercise, home  and travel O2 prescription;Maintenance of O2 saturations>88%;Compliance with respiratory medication;Demonstrates proper use of MDI's;Exhibits proper breathing techniques, such as pursed lip breathing or other method taught during program session;Verbalizes importance of monitoring SPO2 with pulse oximeter and return demonstration          Oxygen  Re-Evaluation:  Oxygen  Re-Evaluation     Row Name 02/18/24 1555 03/10/24 1546 04/01/24 1558 05/05/24 1535 05/19/24 1548     Program Oxygen  Prescription   Program Oxygen  Prescription -- Continuous Continuous Continuous Continuous   Liters per minute -- 2 2 2 2      Home Oxygen    Home Oxygen  Device -- Portable Concentrator;Home Concentrator Portable Concentrator;Home Concentrator Portable Concentrator;Home Concentrator Portable Concentrator;Home Concentrator   Sleep Oxygen  Prescription -- Continuous Continuous;CPAP  Mikah states she cannot tolerate it. Continuous;CPAP  does not tolerate Continuous;CPAP   Liters per minute -- 2 2 2  2  Home Exercise Oxygen  Prescription -- Continuous Continuous Continuous Continuous   Liters per minute -- 2 2 2 2    Home Resting Oxygen  Prescription -- None None None None   Compliance with Home Oxygen  Use -- -- No No No   Comments -- -- -- --  does not wear CPAP --     Goals/Expected Outcomes   Short Term Goals -- To learn and exhibit compliance with exercise, home and travel O2 prescription;To learn and understand importance of monitoring SPO2 with pulse oximeter and demonstrate accurate use of the pulse oximeter.;To learn and demonstrate proper pursed lip breathing techniques or other breathing techniques. ;To learn and demonstrate proper use of respiratory medications;To learn and understand importance of maintaining oxygen  saturations>88% Other;To learn and understand importance of maintaining oxygen  saturations>88% Other To learn and understand importance of  monitoring SPO2 with pulse oximeter and demonstrate accurate use of the pulse oximeter.;To learn and exhibit compliance with exercise, home and travel O2 prescription   Long  Term Goals -- Exhibits compliance with exercise, home  and travel O2 prescription;Maintenance of O2 saturations>88%;Compliance with respiratory medication;Demonstrates proper use of MDI's;Exhibits proper breathing techniques, such as pursed lip breathing or other method taught during program session;Verbalizes importance of monitoring SPO2 with pulse oximeter and return demonstration Other;Maintenance of O2 saturations>88% Other Exhibits compliance with exercise, home  and travel O2 prescription;Verbalizes importance of monitoring SPO2 with pulse oximeter and return demonstration   Comments Reviewed PLB technique with pt.  Talked about how it works and it's importance in maintaining their exercise saturations. Arlen continues to practice PLB technique, although she reports that she is still SOB even when wearing her O2. She states that she feels the same SOB while wearing her oxygen  even though her O2 saturations have stayed above 90% while on continuous O2. Talked about how PLB works and it's importance in maintaining their exercise saturations. Adalene states that her concentrator does not do anything for her shortness of breath so she does not use it when she is out and about. Informed her the differences in shortness of breath and oxygenation. Patient verbalizes understanding. She states she does not tolerate wearing any of her CPAP mask. Kimisha states that she is not wearing her CPAP due to it falling off her face. She states it does not stay on so she does not wear it. Informed patient why it is important to wear it. She states aving about  five different mask and nothing is working for her. Malarie report that she has been using her oxygen  at night again. She does not use oxygen  during the day but just stops and rests when she gets SOB. PLB was  reviewed and patient states that she does use PLB at home to help control SOB. She does have a pulse ox at home and and does occationally monitor SaO2. She states her oxygen  levels are good at rest but do drop sometimes with active.   Goals/Expected Outcomes Short: Become more profiecient at using PLB.   Long: Become independent at using PLB. Short: Become more profiecient at using PLB.   Long: Become independent at using PLB. Short: maintain spo2 greater than 88 percent on exertion. Long: maintain oxygen  saturation independently. Short: give her CPAP another try. Long: adhere to using a CPAP. Short: continue to use oygen at night. Long: adhere to using CPAP long term.      Oxygen  Discharge (Final Oxygen  Re-Evaluation):  Oxygen  Re-Evaluation - 05/19/24 1548       Program Oxygen   Prescription   Program Oxygen  Prescription Continuous    Liters per minute 2      Home Oxygen    Home Oxygen  Device Portable Concentrator;Home Concentrator    Sleep Oxygen  Prescription Continuous;CPAP    Liters per minute 2    Home Exercise Oxygen  Prescription Continuous    Liters per minute 2    Home Resting Oxygen  Prescription None    Compliance with Home Oxygen  Use No      Goals/Expected Outcomes   Short Term Goals To learn and understand importance of monitoring SPO2 with pulse oximeter and demonstrate accurate use of the pulse oximeter.;To learn and exhibit compliance with exercise, home and travel O2 prescription    Long  Term Goals Exhibits compliance with exercise, home  and travel O2 prescription;Verbalizes importance of monitoring SPO2 with pulse oximeter and return demonstration    Comments Nary report that she has been using her oxygen  at night again. She does not use oxygen  during the day but just stops and rests when she gets SOB. PLB was reviewed and patient states that she does use PLB at home to help control SOB. She does have a pulse ox at home and and does occationally monitor SaO2. She states her  oxygen  levels are good at rest but do drop sometimes with active.    Goals/Expected Outcomes Short: continue to use oygen at night. Long: adhere to using CPAP long term.          Initial Exercise Prescription:  Initial Exercise Prescription - 02/11/24 1500       Date of Initial Exercise RX and Referring Provider   Date 02/11/24    Referring Provider Dr. Lavelle Servant      Oxygen    Oxygen  Continuous    Liters 2    Maintain Oxygen  Saturation 88% or higher      Treadmill   MPH 1.5    Grade 0    Minutes 15    METs 2.15      NuStep   Level 2    SPM 80    Minutes 15    METs 2.7      REL-XR   Level 2    Watts 25    Speed 50    Minutes 15    METs 2.7      Track   Laps 20    Minutes 15    METs 2.09      Intensity   THRR 40-80% of Max Heartrate 122-147    Ratings of Perceived Exertion 11-13    Perceived Dyspnea 0-4      Resistance Training   Training Prescription Yes    Weight 3    Reps 10-15          Perform Capillary Blood Glucose checks as needed.  Exercise Prescription Changes:   Exercise Prescription Changes     Row Name 02/11/24 1500 03/04/24 1600 03/16/24 1400 04/01/24 1700 04/07/24 1500     Response to Exercise   Blood Pressure (Admit) 122/58 108/60 138/66 120/70 --   Blood Pressure (Exercise) 162/64 158/82 -- 146/72 --   Blood Pressure (Exit) 142/60 126/74 118/60 118/88 --   Heart Rate (Admit) 98 bpm 95 bpm 106 bpm 109 bpm --   Heart Rate (Exercise) 112 bpm 108 bpm 117 bpm 120 bpm --   Heart Rate (Exit) 106 bpm 90 bpm 101 bpm 108 bpm --   Oxygen  Saturation (Admit) 97 % 95 % 98 % 97 % --   Oxygen  Saturation (  Exercise) 95 % 88 % 94 % 95 % --   Oxygen  Saturation (Exit) 98 % 94 % 97 % 96 % --   Rating of Perceived Exertion (Exercise) 12 13 13 13  --   Perceived Dyspnea (Exercise) 4 4 3 3  --   Symptoms SOB none none none --   Comments results First 2 weeks of exercise -- -- --   Duration -- Progress to 30 minutes of  aerobic without  signs/symptoms of physical distress Continue with 30 min of aerobic exercise without signs/symptoms of physical distress. Continue with 30 min of aerobic exercise without signs/symptoms of physical distress. Continue with 30 min of aerobic exercise without signs/symptoms of physical distress.   Intensity -- THRR unchanged THRR unchanged THRR unchanged THRR unchanged     Progression   Progression -- Continue to progress workloads to maintain intensity without signs/symptoms of physical distress. Continue to progress workloads to maintain intensity without signs/symptoms of physical distress. Continue to progress workloads to maintain intensity without signs/symptoms of physical distress. Continue to progress workloads to maintain intensity without signs/symptoms of physical distress.   Average METs -- 2.7 2.33 2.02 2.02     Resistance Training   Training Prescription -- Yes Yes Yes Yes   Weight -- 3 3 3 3    Reps -- 10-15 10-15 10-15 10-15     Interval Training   Interval Training -- No No No No     Oxygen    Oxygen  -- Continuous Continuous Continuous Continuous   Liters -- 3 2 2 2      Treadmill   MPH -- 1.5 1.5 1.3 1.3   Grade -- 0 0 0 0   Minutes -- 15 15 15 15    METs -- 2.15 2.15 1.99 1.99     Recumbant Bike   Level -- 1.4 -- -- --   Watts -- 25 -- -- --   Minutes -- 15 -- -- --   METs -- 2.86 -- -- --     NuStep   Level -- 3 -- 4 4   Minutes -- 15 -- 15 15   METs -- 2.8 -- 2.1 2.1     REL-XR   Level -- 4 4 -- --   Minutes -- 15 15 -- --   METs -- 3.9 3 -- --     T5 Nustep   Level -- -- -- 3  T6 3  T6   SPM -- -- -- 80 80   Minutes -- -- -- 15 15   METs -- -- -- 2 2     Biostep-RELP   Level -- -- 1 -- --   Minutes -- -- 15 -- --   METs -- -- 2 -- --     Home Exercise Plan   Plans to continue exercise at -- -- -- -- Home (comment)  weights and walking   Frequency -- -- -- -- Add 2 additional days to program exercise sessions.   Initial Home Exercises Provided --  -- -- -- 04/07/24     Oxygen    Maintain Oxygen  Saturation -- 88% or higher 88% or higher 88% or higher 88% or higher    Row Name 04/14/24 1500 04/28/24 1400 05/11/24 1300 05/25/24 1100 06/10/24 1300     Response to Exercise   Blood Pressure (Admit) 138/66 112/68 132/76 122/80 132/78   Blood Pressure (Exercise) 124/80 -- -- -- --   Blood Pressure (Exit) 124/68 102/60 106/64 120/60 102/52   Heart Rate (Admit)  103 bpm 95 bpm 91 bpm 91 bpm 92 bpm   Heart Rate (Exercise) 114 bpm 110 bpm 101 bpm 100 bpm 104 bpm   Heart Rate (Exit) 105 bpm 105 bpm 98 bpm 94 bpm 95 bpm   Oxygen  Saturation (Admit) 97 % 96 % 95 % 97 % 91 %   Oxygen  Saturation (Exercise) 94 % 94 % 94 % 95 % 91 %   Oxygen  Saturation (Exit) 97 % 98 % 96 % 97 % 97 %   Rating of Perceived Exertion (Exercise) 13 13 12 12 12    Perceived Dyspnea (Exercise) 4 3 3.5 3 3    Symptoms none none none none none   Duration Continue with 30 min of aerobic exercise without signs/symptoms of physical distress. Continue with 30 min of aerobic exercise without signs/symptoms of physical distress. Continue with 30 min of aerobic exercise without signs/symptoms of physical distress. Continue with 30 min of aerobic exercise without signs/symptoms of physical distress. Continue with 30 min of aerobic exercise without signs/symptoms of physical distress.   Intensity THRR unchanged THRR unchanged THRR unchanged THRR unchanged THRR unchanged     Progression   Progression Continue to progress workloads to maintain intensity without signs/symptoms of physical distress. Continue to progress workloads to maintain intensity without signs/symptoms of physical distress. Continue to progress workloads to maintain intensity without signs/symptoms of physical distress. Continue to progress workloads to maintain intensity without signs/symptoms of physical distress. Continue to progress workloads to maintain intensity without signs/symptoms of physical distress.   Average  METs 2.3 2.22 2.29 2.5 2     Resistance Training   Training Prescription Yes Yes Yes Yes Yes   Weight 3 3 3  lb 3 lb 3 lb   Reps 10-15 10-15 10-15 10-15 10-15     Interval Training   Interval Training No No No No No     Oxygen    Oxygen  Continuous Continuous Continuous Continuous Continuous   Liters 2 2 2 2 2      Treadmill   MPH 1.4 1.5 1.4 1.5 1.5   Grade 0 0 0 0 0   Minutes 15 15 15 15 15    METs 2.07 2.15 2.07 2.15 2.15     NuStep   Level -- 3 -- -- 1  T6   Minutes -- 15 -- -- 15   METs -- 2.3 -- -- 1.9     REL-XR   Level 4 -- 4 4 4    Minutes 15 -- 15 15 15    METs 3.3 -- 3.5 3.7 --     T5 Nustep   Level -- -- 3  T6 nustep -- --   Minutes -- -- 15 -- --   METs -- -- 2 -- --     Biostep-RELP   Level 1 -- 2 -- --   Minutes 15 -- 15 -- --   METs 2 -- 2 -- --     Home Exercise Plan   Plans to continue exercise at Home (comment)  weights and walking Home (comment)  weights and walking Home (comment)  weights and walking Home (comment)  weights and walking Home (comment)  weights and walking   Frequency Add 2 additional days to program exercise sessions. Add 2 additional days to program exercise sessions. Add 2 additional days to program exercise sessions. Add 2 additional days to program exercise sessions. Add 2 additional days to program exercise sessions.   Initial Home Exercises Provided 04/07/24 04/07/24 04/07/24 04/07/24 04/07/24  Oxygen    Maintain Oxygen  Saturation 88% or higher 88% or higher 88% or higher 88% or higher 88% or higher    Row Name 06/24/24 1300             Response to Exercise   Blood Pressure (Admit) 144/82       Blood Pressure (Exercise) 158/70       Blood Pressure (Exit) 100/60       Heart Rate (Admit) 100 bpm       Heart Rate (Exercise) 104 bpm       Heart Rate (Exit) 100 bpm       Oxygen  Saturation (Admit) 98 %       Oxygen  Saturation (Exercise) 97 %       Oxygen  Saturation (Exit) 96 %       Rating of Perceived Exertion (Exercise)  12       Perceived Dyspnea (Exercise) 4       Symptoms none       Duration Continue with 30 min of aerobic exercise without signs/symptoms of physical distress.       Intensity THRR unchanged         Progression   Progression Continue to progress workloads to maintain intensity without signs/symptoms of physical distress.       Average METs 1.89         Resistance Training   Training Prescription Yes       Weight 3 lb       Reps 10-15         Interval Training   Interval Training No         Oxygen    Oxygen  Continuous       Liters 2         Treadmill   MPH 1       Grade 0       Minutes 15       METs 1.77         Biostep-RELP   Level 1       Minutes 15       METs 2         Home Exercise Plan   Plans to continue exercise at Home (comment)  weights and walking       Frequency Add 2 additional days to program exercise sessions.       Initial Home Exercises Provided 04/07/24         Oxygen    Maintain Oxygen  Saturation 88% or higher          Exercise Comments:   Exercise Comments     Row Name 02/18/24 1553           Exercise Comments First full day of exercise!  Patient was oriented to gym and equipment including functions, settings, policies, and procedures.  Patient's individual exercise prescription and treatment plan were reviewed.  All starting workloads were established based on the results of the 6 minute walk test done at initial orientation visit.  The plan for exercise progression was also introduced and progression will be customized based on patient's performance and goals.          Exercise Goals and Review:   Exercise Goals     Row Name 02/11/24 1535             Exercise Goals   Increase Physical Activity Yes       Intervention Provide advice, education, support and counseling about physical activity/exercise needs.;Develop an individualized exercise prescription for  aerobic and resistive training based on initial evaluation findings, risk  stratification, comorbidities and participant's personal goals.       Expected Outcomes Short Term: Attend rehab on a regular basis to increase amount of physical activity.;Long Term: Add in home exercise to make exercise part of routine and to increase amount of physical activity.;Long Term: Exercising regularly at least 3-5 days a week.       Increase Strength and Stamina Yes       Intervention Provide advice, education, support and counseling about physical activity/exercise needs.;Develop an individualized exercise prescription for aerobic and resistive training based on initial evaluation findings, risk stratification, comorbidities and participant's personal goals.       Expected Outcomes Short Term: Increase workloads from initial exercise prescription for resistance, speed, and METs.;Short Term: Perform resistance training exercises routinely during rehab and add in resistance training at home;Long Term: Improve cardiorespiratory fitness, muscular endurance and strength as measured by increased METs and functional capacity ( )       Able to understand and use rate of perceived exertion (RPE) scale Yes       Intervention Provide education and explanation on how to use RPE scale       Expected Outcomes Short Term: Able to use RPE daily in rehab to express subjective intensity level;Long Term:  Able to use RPE to guide intensity level when exercising independently       Able to understand and use Dyspnea scale Yes       Intervention Provide education and explanation on how to use Dyspnea scale       Expected Outcomes Short Term: Able to use Dyspnea scale daily in rehab to express subjective sense of shortness of breath during exertion;Long Term: Able to use Dyspnea scale to guide intensity level when exercising independently       Knowledge and understanding of Target Heart Rate Range (THRR) Yes       Intervention Provide education and explanation of THRR including how the numbers were predicted  and where they are located for reference       Expected Outcomes Short Term: Able to state/look up THRR;Long Term: Able to use THRR to govern intensity when exercising independently;Short Term: Able to use daily as guideline for intensity in rehab       Able to check pulse independently Yes       Intervention Provide education and demonstration on how to check pulse in carotid and radial arteries.;Review the importance of being able to check your own pulse for safety during independent exercise       Expected Outcomes Short Term: Able to explain why pulse checking is important during independent exercise       Understanding of Exercise Prescription Yes       Intervention Provide education, explanation, and written materials on patient's individual exercise prescription       Expected Outcomes Short Term: Able to explain program exercise prescription;Long Term: Able to explain home exercise prescription to exercise independently          Exercise Goals Re-Evaluation :  Exercise Goals Re-Evaluation     Row Name 02/18/24 1554 03/04/24 1649 03/16/24 1452 04/01/24 1751 04/07/24 1550     Exercise Goal Re-Evaluation   Exercise Goals Review Increase Physical Activity;Able to understand and use rate of perceived exertion (RPE) scale;Knowledge and understanding of Target Heart Rate Range (THRR);Understanding of Exercise Prescription;Increase Strength and Stamina;Able to understand and use Dyspnea scale;Able to check pulse independently Increase Physical Activity;Increase Strength  and Stamina;Understanding of Exercise Prescription Increase Physical Activity;Increase Strength and Stamina;Understanding of Exercise Prescription Increase Physical Activity;Increase Strength and Stamina;Understanding of Exercise Prescription Increase Physical Activity;Able to understand and use rate of perceived exertion (RPE) scale;Knowledge and understanding of Target Heart Rate Range (THRR);Understanding of Exercise  Prescription;Increase Strength and Stamina;Able to understand and use Dyspnea scale;Able to check pulse independently   Comments Reviewed RPE and dyspnea scale, THR and program prescription with pt today.  Pt voiced understanding and was given a copy of goals to take home. Anise is off to a good start in the program. She was able to attend her first few sessions during this weeks review period. During these sessions she was able to increase from level 2 to 3 on the T4 nustep, and increase from level 2 to 4 on the XR. We will continue to monitor her progress in the program. Ronnette is doing well in the program and only attended 2 sessions in this review. She maintained level 4 on the XR and a speed of 1.5 mph on the treadmiil with no incline. She also added the biostep at level 1 to her exercise prescription. We will continue to monitor her progress in the program. Ellyce is doing well in the program and only attended 2 sessions in this review. She increased to level 4 on the T4 nustep. She also used the T6 nustep at level 3 and the treadmill at a speed of 1.3 mph with no incline. We will continue to monitor her progress in the program. Reviewed home exercise with pt today.  Pt plans to use hand weights and walk for exercise.  Reviewed THR, pulse, RPE, sign and symptoms, pulse oximetery and when to call 911 or MD.  Also discussed weather considerations and indoor options.  Pt voiced understanding.   Expected Outcomes Short: Use RPE daily to regulate intensity.  Long: Follow program prescription in THR. Short: Continue to follow exercise prescription. Long: Continue exercise to improve strength and stamina. Short: Attend rehab more consistently. Long: Continue exercise to improve strength and stamina. Short: Attend rehab more consistently. Long: Continue exercise to improve strength and stamina. Short: add 1-2 days a week of exercise at home on off days of rehab. Long: Maintain independent exercise routine.    Row Name  04/14/24 1549 04/28/24 1454 05/11/24 1346 05/19/24 1542 05/25/24 1135     Exercise Goal Re-Evaluation   Exercise Goals Review Increase Physical Activity;Increase Strength and Stamina;Understanding of Exercise Prescription Increase Physical Activity;Increase Strength and Stamina;Understanding of Exercise Prescription Increase Physical Activity;Increase Strength and Stamina;Understanding of Exercise Prescription Increase Physical Activity;Increase Strength and Stamina;Understanding of Exercise Prescription Increase Physical Activity;Increase Strength and Stamina;Understanding of Exercise Prescription   Comments Lorrene is doing well in rehab. She was able to increase her treadmill speed from 1.3mph to 1.4mph. She was also able to maintain level 4 on the XR. We will continue to monitor her progress in the program. Riann continues to do well in rehab. She was only able to attend one session during this review period. During the one session she was able to increase her speed on the treadmill from 1.4 to 1.5 mph. We will continue to monitor her progress in the program. Leoma continues to do well in rehab. She continues to walk on the treadmill at a speed of 1.4 mph with no incline. She also improved to level 2 on the biostep and continues to work at level 3 on the T6 nustep and level 4 on the XR. We will  continue to monitor her progress in the program. Rosea reports that she does not do much structured exercise at home but tries to remain as active as she can with her house work. She was encouraged to try to attend pulmonary rehab consistently and to try to incorporate more more home activites as she feels she can tolerate it. She reports that her SOB is her main limiting factor. Tilia is doing well in rehab. She has been able to increase her speed on the treadmill from 1.4 to 1. with no incline. She was also able to maintain a level 4 on the XR. We will continue to monitor her progress in the program.   Expected  Outcomes Short: Continue to increase treadmill workloads. Long: Continue exercise to improve strength and stamina. Short: Continue to increase treadmill workloads. Long: Continue exercise to improve strength and stamina. Short: Continue to progressively increase treadmill workload. Long: Continue exercise to improve strength and stamina. Short: try to attend rehab consistently and add more activiity at home. Long: maintain an independent exercise routine upon graduation from rehab. Short: Continue to increase treadmill workload. Long: Continue exercise to improve strength and stamina.    Row Name 06/10/24 1354 06/24/24 1358           Exercise Goal Re-Evaluation   Exercise Goals Review Increase Physical Activity;Increase Strength and Stamina;Understanding of Exercise Prescription Increase Physical Activity;Increase Strength and Stamina;Understanding of Exercise Prescription      Comments Linebaugh is doing well in rehab. She has been able to maintain her workload on the treadmill at 1. and 0% incline. She was also able to maintain level 4 on the Xr. We will continue to monitor her progress in the program. Latausha has only attended one session in rehab since the last review. She had a high blood pressure which caused her to have decreased workloads during her one session. We will encourage her to attend rehab more consistently and continue to monitor her progress in the program.      Expected Outcomes Short: Work to increase treadmill workloads. Long: Continue exercise to improve strength and stamina. --         Discharge Exercise Prescription (Final Exercise Prescription Changes):  Exercise Prescription Changes - 06/24/24 1300       Response to Exercise   Blood Pressure (Admit) 144/82    Blood Pressure (Exercise) 158/70    Blood Pressure (Exit) 100/60    Heart Rate (Admit) 100 bpm    Heart Rate (Exercise) 104 bpm    Heart Rate (Exit) 100 bpm    Oxygen  Saturation (Admit) 98 %    Oxygen   Saturation (Exercise) 97 %    Oxygen  Saturation (Exit) 96 %    Rating of Perceived Exertion (Exercise) 12    Perceived Dyspnea (Exercise) 4    Symptoms none    Duration Continue with 30 min of aerobic exercise without signs/symptoms of physical distress.    Intensity THRR unchanged      Progression   Progression Continue to progress workloads to maintain intensity without signs/symptoms of physical distress.    Average METs 1.89      Resistance Training   Training Prescription Yes    Weight 3 lb    Reps 10-15      Interval Training   Interval Training No      Oxygen    Oxygen  Continuous    Liters 2      Treadmill   MPH 1    Grade 0  Minutes 15    METs 1.77      Biostep-RELP   Level 1    Minutes 15    METs 2      Home Exercise Plan   Plans to continue exercise at Home (comment)   weights and walking   Frequency Add 2 additional days to program exercise sessions.    Initial Home Exercises Provided 04/07/24      Oxygen    Maintain Oxygen  Saturation 88% or higher          Nutrition:  Target Goals: Understanding of nutrition guidelines, daily intake of sodium 1500mg , cholesterol 200mg , calories 30% from fat and 7% or less from saturated fats, daily to have 5 or more servings of fruits and vegetables.  Education: Nutrition 1 -Group instruction provided by verbal, written material, interactive activities, discussions, models, and posters to present general guidelines for heart healthy nutrition including macronutrients, label reading, and promoting whole foods over processed counterparts. Education serves as Pensions consultant of discussion of heart healthy eating for all. Written material provided at class time.     Education: Nutrition 2 -Group instruction provided by verbal, written material, interactive activities, discussions, models, and posters to present general guidelines for heart healthy nutrition including sodium, cholesterol, and saturated fat. Providing  guidance of habit forming to improve blood pressure, cholesterol, and body weight. Written material provided at class time.     Biometrics:  Pre Biometrics - 02/11/24 1535       Pre Biometrics   Height 5' 7.55 (1.716 m)    Weight 201 lb 11.2 oz (91.5 kg)    Waist Circumference 39.5 inches    Hip Circumference 43 inches    Waist to Hip Ratio 0.92 %    BMI (Calculated) 31.07    Single Leg Stand 7.34 seconds           Nutrition Therapy Plan and Nutrition Goals:   Nutrition Assessments:  MEDIFICTS Score Key: >=70 Need to make dietary changes  40-70 Heart Healthy Diet <= 40 Therapeutic Level Cholesterol Diet  Flowsheet Row Pulmonary Rehab from 02/13/2023 in [facility] Cardiac and Pulmonary Rehab  Picture Your Plate Total Score on Discharge 56   Picture Your Plate Scores: <59 Unhealthy dietary pattern with much room for improvement. 41-50 Dietary pattern unlikely to meet recommendations for good health and room for improvement. 51-60 More healthful dietary pattern, with some room for improvement.  >60 Healthy dietary pattern, although there may be some specific behaviors that could be improved.   Nutrition Goals Re-Evaluation:  Nutrition Goals Re-Evaluation     Row Name 03/10/24 1535 04/01/24 1556 05/19/24 1537         Goals   Nutrition Goal Jannine has deferred to meet with the RD at this time. She said she may be open to meeting with him in the future. -- --     Comment -- Patient was informed on why it is important to maintain a balanced diet when dealing with Respiratory issues. Explained that it takes a lot of energy to breath and when they are short of breath often they will need to have a good diet to help keep up with the calories they are expending for breathing. Yaslene continues to [employer] appointment but states she is trying to eat as healthy as possible     Expected Outcome -- Short: Choose and plan snacks accordingly to patients caloric intake to improve breathing.  Long: Maintain a diet independently that meets their caloric intake to aid in  daily shortness of breath. --        Nutrition Goals Discharge (Final Nutrition Goals Re-Evaluation):  Nutrition Goals Re-Evaluation - 05/19/24 1537       Goals   Comment Sheronda continues to [employer] appointment but states she is trying to eat as healthy as possible          Psychosocial: Target Goals: Acknowledge presence or absence of significant depression and/or stress, maximize coping skills, provide positive support system. Participant is able to verbalize types and ability to use techniques and skills needed for reducing stress and depression.   Education: Stress, Anxiety, and Depression - Group verbal and visual presentation to define topics covered.  Reviews how body is impacted by stress, anxiety, and depression.  Also discusses healthy ways to reduce stress and to treat/manage anxiety and depression.  Written material provided at class time. Flowsheet Row Pulmonary Rehab from 01/29/2023 in [facility] Cardiac and Pulmonary Rehab  Date 01/22/23  Educator KW  Instruction Review Code 1- [employer] Understanding    Education: Sleep Hygiene -Provides group verbal and written instruction about how sleep can affect your health.  Define sleep hygiene, discuss sleep cycles and impact of sleep habits. Review good sleep hygiene tips.    Initial Review & Psychosocial Screening:   Quality of Life Scores:  Scores of 19 and below usually indicate a poorer quality of life in these areas.  A difference of  2-3 points is a clinically meaningful difference.  A difference of 2-3 points in the total score of the Quality of Life Index has been associated with significant improvement in overall quality of life, self-image, physical symptoms, and general health in studies assessing change in quality of life.  PHQ-9: Review Flowsheet  More data exists      02/11/2024 08/20/2023 02/13/2023 01/28/2023 11/18/2022  Depression screen  PHQ 2/9  Decreased Interest 0 0 1 0 1  Down, Depressed, Hopeless 0 0 0 0 0  PHQ - 2 Score 0 0 1 0 1  Altered sleeping 0 1 0 0 0  Tired, decreased energy 0 1 3 3 3   Change in appetite 0 0 0 0 0  Feeling bad or failure about yourself  0 0 0 0 0  Trouble concentrating 0 0 0 0 0  Moving slowly or fidgety/restless 0 0 0 0 0  Suicidal thoughts 0 0 0 0 0  PHQ-9 Score 0 2 4 3 4   Difficult doing work/chores - - Somewhat difficult Not difficult at all -   Interpretation of Total Score  Total Score Depression Severity:  1-4 = Minimal depression, 5-9 = Mild depression, 10-14 = Moderate depression, 15-19 = Moderately severe depression, 20-27 = Severe depression   Psychosocial Evaluation and Intervention:   Psychosocial Re-Evaluation:  Psychosocial Re-Evaluation     Row Name 03/10/24 1536 04/01/24 1557 05/19/24 1546         Psychosocial Re-Evaluation   Current issues with Current Stress Concerns;Current Sleep Concerns Current Stress Concerns;Current Sleep Concerns Current Stress Concerns;Current Sleep Concerns     Comments Kayliee reports no major stressors at this time. Mahalie states that she is frustrated with her breathing as it has restricted her from doing the things she enjoys. She states that se struggles with staying asleep at this time. She has been taking tylenol  PM, but reports it only helps her sleep sometimes. Patient reports no issues with their current mental states, sleep, stress, depression or anxiety. Will follow up with patient in a few weeks for  any changes. Patient reports no concerns with sleep, stress,  or mental health. She wants to try to come more consistently to pulmonary rehab for exercise and to try to help improve SOB.     Expected Outcomes Short: Continue to attend rehab for exercise and mental boost. Long: Maintain positive outlook. Short: Continue to exercise regularly to support mental health and notify staff of any changes. Long: maintain mental health and well being  through teaching of rehab or prescribed medications independently. Short: consistently attend pulmonary rehab for mental health benefits of exercise. Long: maintain good mental health routine.     Interventions Encouraged to attend Pulmonary Rehabilitation for the exercise Encouraged to attend Pulmonary Rehabilitation for the exercise Encouraged to attend Pulmonary Rehabilitation for the exercise     Continue Psychosocial Services  No Follow up required Follow up required by staff Follow up required by staff       Initial Review   Source of Stress Concerns Chronic Illness -- --     Comments Her shortness of breath is her main stressor -- --        Psychosocial Discharge (Final Psychosocial Re-Evaluation):  Psychosocial Re-Evaluation - 05/19/24 1546       Psychosocial Re-Evaluation   Current issues with Current Stress Concerns;Current Sleep Concerns    Comments Patient reports no concerns with sleep, stress,  or mental health. She wants to try to come more consistently to pulmonary rehab for exercise and to try to help improve SOB.    Expected Outcomes Short: consistently attend pulmonary rehab for mental health benefits of exercise. Long: maintain good mental health routine.    Interventions Encouraged to attend Pulmonary Rehabilitation for the exercise    Continue Psychosocial Services  Follow up required by staff          Education: Education Goals: Education classes will be provided on a weekly basis, covering required topics. Participant will state understanding/return demonstration of topics presented.  Learning Barriers/Preferences:   General Pulmonary Education Topics:  Infection Prevention: - Provides verbal and written material to individual with discussion of infection control including proper hand washing and proper equipment cleaning during exercise session. Flowsheet Row Pulmonary Rehab from 02/11/2024 in [facility] Cardiac and Pulmonary Rehab  Date 02/11/24  Educator [facility]   Instruction Review Code 1- Verbalizes Understanding    Falls Prevention: - Provides verbal and written material to individual with discussion of falls prevention and safety. Flowsheet Row Pulmonary Rehab from 02/11/2024 in [facility] Cardiac and Pulmonary Rehab  Date 02/11/24  Educator [facility]  Instruction Review Code 1- Verbalizes Understanding    Chronic Lung Disease Review: - Group verbal instruction with posters, models, PowerPoint presentations and videos,  to review new updates, new respiratory medications, new advancements in procedures and treatments. Providing information on websites and 800 numbers for continued self-education. Includes information about supplement oxygen , available portable oxygen  systems, continuous and intermittent flow rates, oxygen  safety, concentrators, and Medicare reimbursement for oxygen . Explanation of Pulmonary Drugs, including class, frequency, complications, importance of spacers, rinsing mouth after steroid MDI's, and proper cleaning methods for nebulizers. Review of basic lung anatomy and physiology related to function, structure, and complications of lung disease. Review of risk factors. Discussion about methods for diagnosing sleep apnea and types of masks and machines for OSA. Includes a review of the use of types of environmental controls: home humidity, furnaces, filters, dust mite/pet prevention, HEPA vacuums. Discussion about weather changes, air quality and the benefits of nasal washing. Instruction on Warning signs, infection symptoms,  calling MD promptly, preventive modes, and value of vaccinations. Review of effective airway clearance, coughing and/or vibration techniques. Emphasizing that all should Create an Action Plan. Written material provided at class time. Flowsheet Row Pulmonary Rehab from 01/29/2023 in [facility]  Education need identified 11/18/22  Date 01/15/23  Educator [facility]  Instruction Review Code 1- Verbalizes  Understanding    AED/CPR: - Group verbal and written instruction with the use of models to demonstrate the basic use of the AED with the basic ABC's of resuscitation.    Tests and Procedures:  - Group verbal and visual presentation and models provide information about basic cardiac anatomy and function. Reviews the testing methods done to diagnose heart disease and the outcomes of the test results. Describes the treatment choices: Medical Management, Angioplasty, or Coronary Bypass Surgery for treating various heart conditions including Myocardial Infarction, Angina, Valve Disease, and Cardiac Arrhythmias.  Written material provided at class time. Flowsheet Row Pulmonary Rehab from 01/29/2023 in [facility]  Date 12/18/22  Educator SB  Instruction Review Code 1- Verbalizes Understanding    Medication Safety: - Group verbal and visual instruction to review commonly prescribed medications for heart and lung disease. Reviews the medication, class of the drug, and side effects. Includes the steps to properly store meds and maintain the prescription regimen.  Written material given at graduation.   Other: -Provides group and verbal instruction on various topics (see comments) Flowsheet Row Pulmonary Rehab from 01/29/2023 in [facility]  Date 12/25/22  Educator SB  Instruction Review Code 1- Verbalizes Understanding  [Jeopardy]    Knowledge Questionnaire Score:    Core Components/Risk Factors/Patient Goals at Admission:   Education:Diabetes - Individual verbal and written instruction to review signs/symptoms of diabetes, desired ranges of glucose level fasting, after meals and with exercise. Acknowledge that pre and post exercise glucose checks will be done for 3 sessions at entry of program.   Know Your Numbers and Heart Failure: - Group verbal and visual instruction to discuss disease risk factors for cardiac and pulmonary disease and  treatment options.  Reviews associated critical values for Overweight/Obesity, Hypertension, Cholesterol, and Diabetes.  Discusses basics of heart failure: signs/symptoms and treatments.  Introduces Heart Failure Zone chart for action plan for heart failure. Written material provided at class time.   Core Components/Risk Factors/Patient Goals Review:   Goals and Risk Factor Review     Row Name 03/10/24 1541 04/01/24 1556 05/19/24 1539         Core Components/Risk Factors/Patient Goals Review   Personal Goals Review Weight Management/Obesity;Improve shortness of breath with ADL's Improve shortness of breath with ADL's Hypertension     Review Jrue states that she would still like to lose some weight. She would like to get down to 150 lbs but is restricted by her breathing and is not able to exercise as much as she feels she needs to. She is continuing to practice PLB but states she still feels SOB. She reports still feeling SOB even when wearing her oxygen , although her O2 saturations have stayed above 90%. Spoke to patient about their shortness of breath and what they can do to improve. Patient has been informed of breathing techniques when starting the program. Patient is informed to tell staff if they have had any med changes and that certain meds they are taking or not taking can be causing shortness of breath. Reba states that she takes all her blood pressur medication and  follows up with her doctor to manage this. She does report that she takes her BP at  home and it is usually good.     Expected Outcomes Short: Continue to practice breathing techniques to improve SOB. Long: Continue to manage lifestyle risk factors. Short: Attend [facility] regularly to improve shortness of breath with ADL's. Long: maintain independence with ADL's Short: continue to check BP at home. Long: work with medical team to continue to control blood pressure.        Core Components/Risk Factors/Patient Goals at Discharge  (Final Review):   Goals and Risk Factor Review - 05/19/24 1539       Core Components/Risk Factors/Patient Goals Review   Personal Goals Review Hypertension    Review Aiyanah states that she takes all her blood pressur medication and follows up with her doctor to manage this. She does report that she takes her BP at  home and it is usually good.    Expected Outcomes Short: continue to check BP at home. Long: work with medical team to continue to control blood pressure.          ITP Comments:  ITP Comments     Row Name 02/11/24 1527 02/18/24 1125 02/18/24 1553 03/17/24 1301 04/14/24 1051   ITP Comments Completed and gym orientation. Initial ITP created and sent for review to Dr. Faud Aleskerov, Medical Director. 30 Day review completed. Medical Director ITP review done, changes made as directed, and signed approval by Medical Director.    new to program First full day of exercise!  Patient was oriented to gym and equipment including functions, settings, policies, and procedures.  Patient's individual exercise prescription and treatment plan were reviewed.  All starting workloads were established based on the results of the 6 minute walk test done at initial orientation visit.  The plan for exercise progression was also introduced and progression will be customized based on patient's performance and goals. 30 Day review completed. Medical Director ITP review done, changes made as directed, and signed approval by Medical Director. New to program. 30 Day review completed. Medical Director ITP review done, changes made as directed, and signed approval by Medical Director.    Row Name 05/12/24 1057 06/09/24 1006 07/07/24 1027       ITP Comments 30 Day review completed. Medical Director ITP review done, changes made as directed, and signed approval by Medical Director. 30 Day review completed. Medical Director ITP review done, changes made as directed, and signed approval by Medical Director. 30 Day  review completed. Medical Director ITP review done, changes made as directed, and signed approval by Medical Director.        Comments: 30 day review

## 2024-07-08 ENCOUNTER — Ambulatory Visit

## 2024-07-08 DIAGNOSIS — R918 Other nonspecific abnormal finding of lung field: Secondary | ICD-10-CM | POA: Diagnosis not present

## 2024-07-08 DIAGNOSIS — J449 Chronic obstructive pulmonary disease, unspecified: Secondary | ICD-10-CM | POA: Diagnosis not present

## 2024-07-08 DIAGNOSIS — R0602 Shortness of breath: Secondary | ICD-10-CM | POA: Diagnosis not present

## 2024-07-10 ENCOUNTER — Other Ambulatory Visit: Payer: Self-pay | Admitting: Family Medicine

## 2024-07-10 DIAGNOSIS — F339 Major depressive disorder, recurrent, unspecified: Secondary | ICD-10-CM

## 2024-07-13 ENCOUNTER — Other Ambulatory Visit: Payer: Self-pay

## 2024-07-13 DIAGNOSIS — N135 Crossing vessel and stricture of ureter without hydronephrosis: Secondary | ICD-10-CM | POA: Diagnosis not present

## 2024-07-14 ENCOUNTER — Ambulatory Visit

## 2024-07-15 ENCOUNTER — Ambulatory Visit

## 2024-07-19 ENCOUNTER — Other Ambulatory Visit: Payer: Self-pay | Admitting: Specialist

## 2024-07-19 ENCOUNTER — Telehealth: Payer: Self-pay

## 2024-07-19 DIAGNOSIS — R918 Other nonspecific abnormal finding of lung field: Secondary | ICD-10-CM

## 2024-07-19 NOTE — Telephone Encounter (Signed)
 Spoke with Nancy [name], she is having surgery on the 18th. She is unsure when she will be able to return to rehab. Requested to be placed on medical hold

## 2024-07-21 ENCOUNTER — Ambulatory Visit

## 2024-07-22 ENCOUNTER — Ambulatory Visit

## 2024-07-26 ENCOUNTER — Ambulatory Visit
Admission: RE | Admit: 2024-07-26 | Discharge: 2024-07-26 | Disposition: A | Discharge status: Home / Self Care | Source: Ambulatory Visit | Attending: Specialist | Admitting: Specialist

## 2024-07-26 DIAGNOSIS — R918 Other nonspecific abnormal finding of lung field: Secondary | ICD-10-CM

## 2024-07-26 DIAGNOSIS — R911 Solitary pulmonary nodule: Secondary | ICD-10-CM | POA: Insufficient documentation

## 2024-07-26 DIAGNOSIS — N1330 Unspecified hydronephrosis: Secondary | ICD-10-CM | POA: Diagnosis not present

## 2024-07-26 LAB — GLUCOSE, CAPILLARY: Glucose-Capillary: 90 mg/dL (ref 70–99)

## 2024-07-26 MED ORDER — FLUDEOXYGLUCOSE F - 18 (FDG) INJECTION
10.6200 | Freq: Once | INTRAVENOUS | Status: AC | PRN
  Administered 2024-07-26: 10.62 via INTRAVENOUS

## 2024-07-28 ENCOUNTER — Ambulatory Visit

## 2024-07-28 DIAGNOSIS — J439 Emphysema, unspecified: Secondary | ICD-10-CM | POA: Diagnosis not present

## 2024-07-29 ENCOUNTER — Ambulatory Visit

## 2024-07-29 DIAGNOSIS — N135 Crossing vessel and stricture of ureter without hydronephrosis: Secondary | ICD-10-CM | POA: Diagnosis not present

## 2024-07-29 DIAGNOSIS — Z7901 Long term (current) use of anticoagulants: Secondary | ICD-10-CM | POA: Diagnosis not present

## 2024-07-29 DIAGNOSIS — Z952 Presence of prosthetic heart valve: Secondary | ICD-10-CM | POA: Diagnosis not present

## 2024-07-29 DIAGNOSIS — Q6239 Other obstructive defects of renal pelvis and ureter: Secondary | ICD-10-CM | POA: Diagnosis not present

## 2024-07-29 NOTE — Progress Notes (Signed)
 Office Cystoscopy Procedure Note  Indication:   Removal of Ureteral Stent  Informed consent  The risks, benefits, complications, treatment options, and expected outcomes were discussed with the patient. The patient concurred with the proposed plan and provided informed consent.   Procedure The patient was placed in the lithotomy position, was prepped and draped in the usual manner using sterile technique, and 2% lidocaine  jelly instilled into the urethra.  A 17 F flexible cystoscope was then inserted into the urethra and the urethra and bladder carefully examined.  The following findings were noted:   Findings: Urethra:Normal Bladder:Normal Ureteral orifices: Normal Ureteral Efflux: Clear Ureteral Stent: Grasped and removed in its entirety   Specimens: Stent               Complications:  None          Disposition: To home after 30 minute observation.         Condition: Stable  Attending Attestation: I performed the procedure.

## 2024-08-04 ENCOUNTER — Ambulatory Visit

## 2024-08-04 DIAGNOSIS — J449 Chronic obstructive pulmonary disease, unspecified: Secondary | ICD-10-CM

## 2024-08-04 NOTE — Progress Notes (Signed)
 Pulmonary Individual Treatment Plan  Patient Details  Name: Nancy [name] MRN: [id number] Date of Birth: 31-Jul-1961 Referring Provider:   Flowsheet Row Pulmonary Rehab from 02/11/2024 in [facility] Cardiac and Pulmonary Rehab  Referring Provider Dr. Lavelle Servant    Initial Encounter Date:  Flowsheet Row Pulmonary Rehab from 02/11/2024 in [facility] Cardiac and Pulmonary Rehab  Date 02/11/24    Visit Diagnosis: Stage 3 severe COPD by GOLD classification (HCC)  Patient's Home Medications on Admission:  Current Outpatient Medications:    acetaminophen  (TYLENOL ) 500 MG tablet, Take 500 mg by mouth every 4 (four) hours as needed., Disp: , Rfl:    albuterol  (VENTOLIN  HFA) 108 (90 Base) MCG/ACT inhaler, SMARTSIG:2 Inhalation Via Inhaler Every 6 Hours PRN, Disp: , Rfl:    amLODipine  (NORVASC ) 5 MG tablet, Take 5 mg by mouth daily., Disp: , Rfl:    benzonatate  (TESSALON ) 200 MG capsule, Take 1 capsule (200 mg total) by mouth 2 (two) times daily as needed for cough., Disp: 20 capsule, Rfl: 0   diclofenac  Sodium (VOLTAREN ) 1 % GEL, Apply 2 g topically 4 (four) times daily., Disp: 100 g, Rfl: 0   doxycycline  (PERIOSTAT ) 20 MG tablet, Take one tab po BID with food., Disp: 60 tablet, Rfl: 11   DULoxetine  (CYMBALTA ) 20 MG capsule, TAKE 2 CAPSULES BY MOUTH TWICE DAILY, Disp: 360 capsule, Rfl: 0   enoxaparin  (LOVENOX ) 100 MG/ML injection, Inject into the skin. (Patient not taking: Reported on 12/24/2023), Disp: , Rfl:    enoxaparin  (LOVENOX ) 40 MG/0.4ML injection, Inject 90 mg into the skin daily. (Patient not taking: Reported on 12/24/2023), Disp: , Rfl:    Fluticasone -Umeclidin-Vilant (TRELEGY ELLIPTA ) 100-62.5-25 MCG/ACT AEPB, Inhale into the lungs., Disp: , Rfl:    furosemide  (LASIX ) 40 MG tablet, Take 40 mg by mouth 2 (two) times daily. (Patient not taking: Reported on 12/24/2023), Disp: , Rfl:    furosemide  (LASIX ) 40 MG tablet, Take 1 tablet by mouth 2 (two) times daily., Disp: , Rfl:    ipratropium-albuterol   (DUONEB) 0.5-2.5 (3) MG/3ML SOLN, Inhale into the lungs., Disp: , Rfl:    levofloxacin  (LEVAQUIN ) 750 MG tablet, Take 1 tablet (750 mg total) by mouth daily., Disp: 7 tablet, Rfl: 0   levothyroxine  (SYNTHROID ) 125 MCG tablet, Take 1 tablet (125 mcg total) by mouth daily before breakfast., Disp: 90 tablet, Rfl: 3   lidocaine  (LIDODERM ) 5 %, Place 1 patch onto the skin every 12 (twelve) hours. Remove & Discard patch within 12 hours or as directed by MD, Disp: 15 patch, Rfl: 0   metoprolol  succinate (TOPROL -XL) 25 MG 24 hr tablet, Take 25 mg by mouth daily., Disp: , Rfl:    Na Sulfate-K Sulfate-Mg Sulfate concentrate 17.5-3.13-1.6 GM/177ML SOLN, , Disp: , Rfl:    ondansetron  (ZOFRAN  ODT) 4 MG disintegrating tablet, Take 1 tablet (4 mg total) by mouth every 8 (eight) hours as needed for nausea or vomiting., Disp: 20 tablet, Rfl: 2   oxyCODONE  (OXY IR/ROXICODONE ) 5 MG immediate release tablet, Take 1 tablet (5 mg total) by mouth every 6 (six) hours as needed for moderate pain (pain score 4-6)., Disp: 8 tablet, Rfl: 0   polyethylene glycol (MIRALAX  / GLYCOLAX ) 17 g packet, Take 17 g by mouth daily as needed., Disp: , Rfl:    senna-docusate (SENOKOT-S) 8.6-50 MG tablet, Take 1 tablet by mouth 2 (two) times daily as needed for mild constipation., Disp: 20 tablet, Rfl: 0   TRELEGY ELLIPTA  100-62.5-25 MCG/ACT AEPB, Inhale into the lungs., Disp: , Rfl:  valACYclovir  (VALTREX ) 1000 MG tablet, Take 1 tablet (1,000 mg total) by mouth daily., Disp: 6 tablet, Rfl: 0   warfarin (COUMADIN ) 5 MG tablet, Take 5 mg by mouth every evening. Per cardiologist: patient supposed to take 7.5 mg 9/24 and 9/25 evening for low INR then resume 5 mg nightly  Goal INR 2.5-3.5, Disp: , Rfl:   Current Facility-Administered Medications:    ipratropium-albuterol  (DUONEB) 0.5-2.5 (3) MG/3ML nebulizer solution 3 mL, 3 mL, Nebulization, Once, Pollak, Adriana M, PA-C  Past Medical History: Past Medical History:  Diagnosis Date   Aortic  dissection (HCC) 2010   COPD (chronic obstructive pulmonary disease) (HCC)    Depression    Hypertension    Hypothyroidism    Pneumonia    PONV (postoperative nausea and vomiting)    Rosacea    Seasonal allergies    Sleep apnea    Does not use CPAP    Tobacco Use: Social History   Tobacco Use  Smoking Status Former   Current packs/day: 0.00   Average packs/day: 0.5 packs/day for 30.0 years (15.0 ttl pk-yrs)   Types: Cigarettes   Start date: 03/11/1986   Quit date: 03/11/2016   Years since quitting: 8.4  Smokeless Tobacco Never  Tobacco Comments   patient states  that she is working at her own pace to quit smoking    Labs: Review Flowsheet  More data may exist      Latest Ref Rng & Units 07/02/2009 08/29/2015 01/05/2018 09/29/2020 08/20/2023  Labs for ITP Cardiac and Pulmonary Rehab  Cholestrol 100 - 199 mg/dL - 823  814  832  837   LDL (calc) 0 - 99 mg/dL - 896  888  898  894   HDL-C >39 mg/dL - 53  50  45  38   Trlycerides 0 - 149 mg/dL - 897  880  883  894   Hemoglobin A1c 4.8 - 5.6 % - - - - 4.8   TCO2 0 - 100 mmol/L 22  - - - -     Pulmonary Assessment Scores:  Pulmonary Assessment Scores     Row Name 02/11/24 1537         mMRC Score   mMRC Score 3        UCSD: Self-administered rating of dyspnea associated with activities of daily living (ADLs) 6-point scale (0 = not at all to 5 = maximal or unable to do because of breathlessness)  Scoring Scores range from 0 to 120.  Minimally important difference is 5 units  CAT: CAT can identify the health impairment of COPD patients and is better correlated with disease progression.  CAT has a scoring range of zero to 40. The CAT score is classified into four groups of low (less than 10), medium (10 - 20), high (21-30) and very high (31-40) based on the impact level of disease on health status. A CAT score over 10 suggests significant symptoms.  A worsening CAT score could be explained by an exacerbation, poor  medication adherence, poor inhaler technique, or progression of COPD or comorbid conditions.  CAT MCID is 2 points  mMRC: mMRC (Modified Medical Research Council) Dyspnea Scale is used to assess the degree of baseline functional disability in patients of respiratory disease due to dyspnea. No minimal important difference is established. A decrease in score of 1 point or greater is considered a positive change.   Pulmonary Function Assessment:   Exercise Target Goals: Exercise Program Goal: Individual exercise prescription set using results  from initial 6 min walk test and THRR while considering  patient's activity barriers and safety.   Exercise Prescription Goal: Initial exercise prescription builds to 30-45 minutes a day of aerobic activity, 2-3 days per week.  Home exercise guidelines will be given to patient during program as part of exercise prescription that the participant will acknowledge.  Education: Aerobic Exercise: - Group verbal and visual presentation on the components of exercise prescription. Introduces F.I.T.T principle from ACSM for exercise prescriptions.  Reviews F.I.T.T. principles of aerobic exercise including progression. Written material provided at class time. Flowsheet Row Pulmonary Rehab from 01/29/2023 in [facility] Cardiac and Pulmonary Rehab  Date 11/27/22  Educator KW  Instruction Review Code 1- [employer] Understanding    Education: Resistance Exercise: - Group verbal and visual presentation on the components of exercise prescription. Introduces F.I.T.T principle from ACSM for exercise prescriptions  Reviews F.I.T.T. principles of resistance exercise including progression. Written material provided at class time. Flowsheet Row Pulmonary Rehab from 01/29/2023 in [facility] Cardiac and Pulmonary Rehab  Date 12/04/22  Educator KW  Instruction Review Code 1- [employer] Understanding     Education: Exercise & Equipment Safety: - Individual verbal instruction and  demonstration of equipment use and safety with use of the equipment. Flowsheet Row Pulmonary Rehab from 02/11/2024 in [facility] Cardiac and Pulmonary Rehab  Date 02/11/24  Educator [facility]  Instruction Review Code 1- Verbalizes Understanding    Education: Exercise Physiology & General Exercise Guidelines: - Group verbal and written instruction with models to review the exercise physiology of the cardiovascular system and associated critical values. Provides general exercise guidelines with specific guidelines to those with heart or lung disease.    Education: Flexibility, Balance, Mind/Body Relaxation: - Group verbal and visual presentation with interactive activity on the components of exercise prescription. Introduces F.I.T.T principle from ACSM for exercise prescriptions. Reviews F.I.T.T. principles of flexibility and balance exercise training including progression. Also discusses the mind body connection.  Reviews various relaxation techniques to help reduce and manage stress (i.e. Deep breathing, progressive muscle relaxation, and visualization). Balance handout provided to take home. Written material provided at class time. Flowsheet Row Pulmonary Rehab from 01/29/2023 in [facility] Cardiac and Pulmonary Rehab  Date 12/11/22  Educator KW  Instruction Review Code 1- Verbalizes Understanding    Activity Barriers & Risk Stratification:  Activity Barriers & Cardiac Risk Stratification - 02/11/24 1530       Activity Barriers & Cardiac Risk Stratification   Activity Barriers None          6 Minute Walk:  6 Minute Walk     Row Name 02/11/24 1528         6 Minute Walk   Phase Initial     Distance 755 feet     Walk Time 5.17 minutes     # of Rest Breaks 2     MPH 1.66     METS 2.7     RPE 12     Perceived Dyspnea  4     VO2 Peak 9.56     Symptoms Yes (comment)     Comments SOB     Resting HR 98 bpm     Resting BP 122/58     Resting Oxygen  Saturation  97 %     Exercise Oxygen  Saturation   during 6 min walk 95 %     Max Ex. HR 112 bpm     Max Ex. BP 162/64       Interval HR   1  Minute HR 102     2 Minute HR 103     3 Minute HR 107     4 Minute HR 110     5 Minute HR 106     6 Minute HR 112     2 Minute Post HR 106     Interval Heart Rate? Yes       Interval Oxygen    Interval Oxygen ? Yes     Baseline Oxygen  Saturation % 97 %     1 Minute Oxygen  Saturation % 98 %     1 Minute Liters of Oxygen  2 L     2 Minute Oxygen  Saturation % 97 %     2 Minute Liters of Oxygen  2 L     3 Minute Oxygen  Saturation % 96 %     3 Minute Liters of Oxygen  2 L     4 Minute Oxygen  Saturation % 96 %     4 Minute Liters of Oxygen  2 L     5 Minute Oxygen  Saturation % 97 %     5 Minute Liters of Oxygen  2 L     6 Minute Oxygen  Saturation % 95 %     6 Minute Liters of Oxygen  2 L     2 Minute Post Oxygen  Saturation % 98 %     2 Minute Post Liters of Oxygen  2 L       Oxygen  Initial Assessment:  Oxygen  Initial Assessment - 02/11/24 1537       Home Oxygen    Home Oxygen  Device Portable Concentrator;Home Concentrator    Sleep Oxygen  Prescription Continuous    Liters per minute 2    Home Exercise Oxygen  Prescription Continuous    Liters per minute 2    Home Resting Oxygen  Prescription None    Compliance with Home Oxygen  Use Yes      Initial 6 min Walk   Oxygen  Used Continuous    Liters per minute 2      Program Oxygen  Prescription   Program Oxygen  Prescription Continuous    Liters per minute 2      Intervention   Short Term Goals To learn and exhibit compliance with exercise, home and travel O2 prescription;To learn and understand importance of monitoring SPO2 with pulse oximeter and demonstrate accurate use of the pulse oximeter.;To learn and demonstrate proper pursed lip breathing techniques or other breathing techniques. ;To learn and demonstrate proper use of respiratory medications;To learn and understand importance of maintaining oxygen  saturations>88%    Long  Term Goals  Exhibits compliance with exercise, home  and travel O2 prescription;Maintenance of O2 saturations>88%;Compliance with respiratory medication;Demonstrates proper use of MDI's;Exhibits proper breathing techniques, such as pursed lip breathing or other method taught during program session;Verbalizes importance of monitoring SPO2 with pulse oximeter and return demonstration          Oxygen  Re-Evaluation:  Oxygen  Re-Evaluation     Row Name 02/18/24 1555 03/10/24 1546 04/01/24 1558 05/05/24 1535 05/19/24 1548     Program Oxygen  Prescription   Program Oxygen  Prescription -- Continuous Continuous Continuous Continuous   Liters per minute -- 2 2 2 2      Home Oxygen    Home Oxygen  Device -- Portable Concentrator;Home Concentrator Portable Concentrator;Home Concentrator Portable Concentrator;Home Concentrator Portable Concentrator;Home Concentrator   Sleep Oxygen  Prescription -- Continuous Continuous;CPAP  Raelee states she cannot tolerate it. Continuous;CPAP  does not tolerate Continuous;CPAP   Liters per minute -- 2 2 2 2    Home Exercise Oxygen  Prescription --  Continuous Continuous Continuous Continuous   Liters per minute -- 2 2 2 2    Home Resting Oxygen  Prescription -- None None None None   Compliance with Home Oxygen  Use -- -- No No No   Comments -- -- -- --  does not wear CPAP --     Goals/Expected Outcomes   Short Term Goals -- To learn and exhibit compliance with exercise, home and travel O2 prescription;To learn and understand importance of monitoring SPO2 with pulse oximeter and demonstrate accurate use of the pulse oximeter.;To learn and demonstrate proper pursed lip breathing techniques or other breathing techniques. ;To learn and demonstrate proper use of respiratory medications;To learn and understand importance of maintaining oxygen  saturations>88% Other;To learn and understand importance of maintaining oxygen  saturations>88% Other To learn and understand importance of monitoring SPO2 with  pulse oximeter and demonstrate accurate use of the pulse oximeter.;To learn and exhibit compliance with exercise, home and travel O2 prescription   Long  Term Goals -- Exhibits compliance with exercise, home  and travel O2 prescription;Maintenance of O2 saturations>88%;Compliance with respiratory medication;Demonstrates proper use of MDI's;Exhibits proper breathing techniques, such as pursed lip breathing or other method taught during program session;Verbalizes importance of monitoring SPO2 with pulse oximeter and return demonstration Other;Maintenance of O2 saturations>88% Other Exhibits compliance with exercise, home  and travel O2 prescription;Verbalizes importance of monitoring SPO2 with pulse oximeter and return demonstration   Comments Reviewed PLB technique with pt.  Talked about how it works and it's importance in maintaining their exercise saturations. Saniyyah continues to practice PLB technique, although she reports that she is still SOB even when wearing her O2. She states that she feels the same SOB while wearing her oxygen  even though her O2 saturations have stayed above 90% while on continuous O2. Talked about how PLB works and it's importance in maintaining their exercise saturations. Lanesha states that her concentrator does not do anything for her shortness of breath so she does not use it when she is out and about. Informed her the differences in shortness of breath and oxygenation. Patient verbalizes understanding. She states she does not tolerate wearing any of her CPAP mask. Mercades states that she is not wearing her CPAP due to it falling off her face. She states it does not stay on so she does not wear it. Informed patient why it is important to wear it. She states aving about  five different mask and nothing is working for her. Paddy report that she has been using her oxygen  at night again. She does not use oxygen  during the day but just stops and rests when she gets SOB. PLB was reviewed and patient  states that she does use PLB at home to help control SOB. She does have a pulse ox at home and and does occationally monitor SaO2. She states her oxygen  levels are good at rest but do drop sometimes with active.   Goals/Expected Outcomes Short: Become more profiecient at using PLB.   Long: Become independent at using PLB. Short: Become more profiecient at using PLB.   Long: Become independent at using PLB. Short: maintain spo2 greater than 88 percent on exertion. Long: maintain oxygen  saturation independently. Short: give her CPAP another try. Long: adhere to using a CPAP. Short: continue to use oygen at night. Long: adhere to using CPAP long term.      Oxygen  Discharge (Final Oxygen  Re-Evaluation):  Oxygen  Re-Evaluation - 05/19/24 1548       Program Oxygen  Prescription   Program Oxygen   Prescription Continuous    Liters per minute 2      Home Oxygen    Home Oxygen  Device Portable Concentrator;Home Concentrator    Sleep Oxygen  Prescription Continuous;CPAP    Liters per minute 2    Home Exercise Oxygen  Prescription Continuous    Liters per minute 2    Home Resting Oxygen  Prescription None    Compliance with Home Oxygen  Use No      Goals/Expected Outcomes   Short Term Goals To learn and understand importance of monitoring SPO2 with pulse oximeter and demonstrate accurate use of the pulse oximeter.;To learn and exhibit compliance with exercise, home and travel O2 prescription    Long  Term Goals Exhibits compliance with exercise, home  and travel O2 prescription;Verbalizes importance of monitoring SPO2 with pulse oximeter and return demonstration    Comments Meeghan report that she has been using her oxygen  at night again. She does not use oxygen  during the day but just stops and rests when she gets SOB. PLB was reviewed and patient states that she does use PLB at home to help control SOB. She does have a pulse ox at home and and does occationally monitor SaO2. She states her oxygen  levels are good  at rest but do drop sometimes with active.    Goals/Expected Outcomes Short: continue to use oygen at night. Long: adhere to using CPAP long term.          Initial Exercise Prescription:  Initial Exercise Prescription - 02/11/24 1500       Date of Initial Exercise RX and Referring Provider   Date 02/11/24    Referring Provider Dr. Lavelle Servant      Oxygen    Oxygen  Continuous    Liters 2    Maintain Oxygen  Saturation 88% or higher      Treadmill   MPH 1.5    Grade 0    Minutes 15    METs 2.15      NuStep   Level 2    SPM 80    Minutes 15    METs 2.7      REL-XR   Level 2    Watts 25    Speed 50    Minutes 15    METs 2.7      Track   Laps 20    Minutes 15    METs 2.09      Intensity   THRR 40-80% of Max Heartrate 122-147    Ratings of Perceived Exertion 11-13    Perceived Dyspnea 0-4      Resistance Training   Training Prescription Yes    Weight 3    Reps 10-15          Perform Capillary Blood Glucose checks as needed.  Exercise Prescription Changes:   Exercise Prescription Changes     Row Name 02/11/24 1500 03/04/24 1600 03/16/24 1400 04/01/24 1700 04/07/24 1500     Response to Exercise   Blood Pressure (Admit) 122/58 108/60 138/66 120/70 --   Blood Pressure (Exercise) 162/64 158/82 -- 146/72 --   Blood Pressure (Exit) 142/60 126/74 118/60 118/88 --   Heart Rate (Admit) 98 bpm 95 bpm 106 bpm 109 bpm --   Heart Rate (Exercise) 112 bpm 108 bpm 117 bpm 120 bpm --   Heart Rate (Exit) 106 bpm 90 bpm 101 bpm 108 bpm --   Oxygen  Saturation (Admit) 97 % 95 % 98 % 97 % --   Oxygen  Saturation (Exercise) 95 % 88 %  94 % 95 % --   Oxygen  Saturation (Exit) 98 % 94 % 97 % 96 % --   Rating of Perceived Exertion (Exercise) 12 13 13 13  --   Perceived Dyspnea (Exercise) 4 4 3 3  --   Symptoms SOB none none none --   Comments results First 2 weeks of exercise -- -- --   Duration -- Progress to 30 minutes of  aerobic without signs/symptoms of physical  distress Continue with 30 min of aerobic exercise without signs/symptoms of physical distress. Continue with 30 min of aerobic exercise without signs/symptoms of physical distress. Continue with 30 min of aerobic exercise without signs/symptoms of physical distress.   Intensity -- THRR unchanged THRR unchanged THRR unchanged THRR unchanged     Progression   Progression -- Continue to progress workloads to maintain intensity without signs/symptoms of physical distress. Continue to progress workloads to maintain intensity without signs/symptoms of physical distress. Continue to progress workloads to maintain intensity without signs/symptoms of physical distress. Continue to progress workloads to maintain intensity without signs/symptoms of physical distress.   Average METs -- 2.7 2.33 2.02 2.02     Resistance Training   Training Prescription -- Yes Yes Yes Yes   Weight -- 3 3 3 3    Reps -- 10-15 10-15 10-15 10-15     Interval Training   Interval Training -- No No No No     Oxygen    Oxygen  -- Continuous Continuous Continuous Continuous   Liters -- 3 2 2 2      Treadmill   MPH -- 1.5 1.5 1.3 1.3   Grade -- 0 0 0 0   Minutes -- 15 15 15 15    METs -- 2.15 2.15 1.99 1.99     Recumbant Bike   Level -- 1.4 -- -- --   Watts -- 25 -- -- --   Minutes -- 15 -- -- --   METs -- 2.86 -- -- --     NuStep   Level -- 3 -- 4 4   Minutes -- 15 -- 15 15   METs -- 2.8 -- 2.1 2.1     REL-XR   Level -- 4 4 -- --   Minutes -- 15 15 -- --   METs -- 3.9 3 -- --     T5 Nustep   Level -- -- -- 3  T6 3  T6   SPM -- -- -- 80 80   Minutes -- -- -- 15 15   METs -- -- -- 2 2     Biostep-RELP   Level -- -- 1 -- --   Minutes -- -- 15 -- --   METs -- -- 2 -- --     Home Exercise Plan   Plans to continue exercise at -- -- -- -- Home (comment)  weights and walking   Frequency -- -- -- -- Add 2 additional days to program exercise sessions.   Initial Home Exercises Provided -- -- -- -- 04/07/24      Oxygen    Maintain Oxygen  Saturation -- 88% or higher 88% or higher 88% or higher 88% or higher    Row Name 04/14/24 1500 04/28/24 1400 05/11/24 1300 05/25/24 1100 06/10/24 1300     Response to Exercise   Blood Pressure (Admit) 138/66 112/68 132/76 122/80 132/78   Blood Pressure (Exercise) 124/80 -- -- -- --   Blood Pressure (Exit) 124/68 102/60 106/64 120/60 102/52   Heart Rate (Admit) 103 bpm 95 bpm 91  bpm 91 bpm 92 bpm   Heart Rate (Exercise) 114 bpm 110 bpm 101 bpm 100 bpm 104 bpm   Heart Rate (Exit) 105 bpm 105 bpm 98 bpm 94 bpm 95 bpm   Oxygen  Saturation (Admit) 97 % 96 % 95 % 97 % 91 %   Oxygen  Saturation (Exercise) 94 % 94 % 94 % 95 % 91 %   Oxygen  Saturation (Exit) 97 % 98 % 96 % 97 % 97 %   Rating of Perceived Exertion (Exercise) 13 13 12 12 12    Perceived Dyspnea (Exercise) 4 3 3.5 3 3    Symptoms none none none none none   Duration Continue with 30 min of aerobic exercise without signs/symptoms of physical distress. Continue with 30 min of aerobic exercise without signs/symptoms of physical distress. Continue with 30 min of aerobic exercise without signs/symptoms of physical distress. Continue with 30 min of aerobic exercise without signs/symptoms of physical distress. Continue with 30 min of aerobic exercise without signs/symptoms of physical distress.   Intensity THRR unchanged THRR unchanged THRR unchanged THRR unchanged THRR unchanged     Progression   Progression Continue to progress workloads to maintain intensity without signs/symptoms of physical distress. Continue to progress workloads to maintain intensity without signs/symptoms of physical distress. Continue to progress workloads to maintain intensity without signs/symptoms of physical distress. Continue to progress workloads to maintain intensity without signs/symptoms of physical distress. Continue to progress workloads to maintain intensity without signs/symptoms of physical distress.   Average METs 2.3 2.22 2.29 2.5 2      Resistance Training   Training Prescription Yes Yes Yes Yes Yes   Weight 3 3 3  lb 3 lb 3 lb   Reps 10-15 10-15 10-15 10-15 10-15     Interval Training   Interval Training No No No No No     Oxygen    Oxygen  Continuous Continuous Continuous Continuous Continuous   Liters 2 2 2 2 2      Treadmill   MPH 1.4 1.5 1.4 1.5 1.5   Grade 0 0 0 0 0   Minutes 15 15 15 15 15    METs 2.07 2.15 2.07 2.15 2.15     NuStep   Level -- 3 -- -- 1  T6   Minutes -- 15 -- -- 15   METs -- 2.3 -- -- 1.9     REL-XR   Level 4 -- 4 4 4    Minutes 15 -- 15 15 15    METs 3.3 -- 3.5 3.7 --     T5 Nustep   Level -- -- 3  T6 nustep -- --   Minutes -- -- 15 -- --   METs -- -- 2 -- --     Biostep-RELP   Level 1 -- 2 -- --   Minutes 15 -- 15 -- --   METs 2 -- 2 -- --     Home Exercise Plan   Plans to continue exercise at Home (comment)  weights and walking Home (comment)  weights and walking Home (comment)  weights and walking Home (comment)  weights and walking Home (comment)  weights and walking   Frequency Add 2 additional days to program exercise sessions. Add 2 additional days to program exercise sessions. Add 2 additional days to program exercise sessions. Add 2 additional days to program exercise sessions. Add 2 additional days to program exercise sessions.   Initial Home Exercises Provided 04/07/24 04/07/24 04/07/24 04/07/24 04/07/24     Oxygen    Maintain Oxygen   Saturation 88% or higher 88% or higher 88% or higher 88% or higher 88% or higher    Row Name 06/24/24 1300             Response to Exercise   Blood Pressure (Admit) 144/82       Blood Pressure (Exercise) 158/70       Blood Pressure (Exit) 100/60       Heart Rate (Admit) 100 bpm       Heart Rate (Exercise) 104 bpm       Heart Rate (Exit) 100 bpm       Oxygen  Saturation (Admit) 98 %       Oxygen  Saturation (Exercise) 97 %       Oxygen  Saturation (Exit) 96 %       Rating of Perceived Exertion (Exercise) 12       Perceived  Dyspnea (Exercise) 4       Symptoms none       Duration Continue with 30 min of aerobic exercise without signs/symptoms of physical distress.       Intensity THRR unchanged         Progression   Progression Continue to progress workloads to maintain intensity without signs/symptoms of physical distress.       Average METs 1.89         Resistance Training   Training Prescription Yes       Weight 3 lb       Reps 10-15         Interval Training   Interval Training No         Oxygen    Oxygen  Continuous       Liters 2         Treadmill   MPH 1       Grade 0       Minutes 15       METs 1.77         Biostep-RELP   Level 1       Minutes 15       METs 2         Home Exercise Plan   Plans to continue exercise at Home (comment)  weights and walking       Frequency Add 2 additional days to program exercise sessions.       Initial Home Exercises Provided 04/07/24         Oxygen    Maintain Oxygen  Saturation 88% or higher          Exercise Comments:   Exercise Comments     Row Name 02/18/24 1553           Exercise Comments First full day of exercise!  Patient was oriented to gym and equipment including functions, settings, policies, and procedures.  Patient's individual exercise prescription and treatment plan were reviewed.  All starting workloads were established based on the results of the 6 minute walk test done at initial orientation visit.  The plan for exercise progression was also introduced and progression will be customized based on patient's performance and goals.          Exercise Goals and Review:   Exercise Goals     Row Name 02/11/24 1535             Exercise Goals   Increase Physical Activity Yes       Intervention Provide advice, education, support and counseling about physical activity/exercise needs.;Develop an individualized exercise prescription for aerobic and resistive training based  on initial evaluation findings, risk stratification,  comorbidities and participant's personal goals.       Expected Outcomes Short Term: Attend rehab on a regular basis to increase amount of physical activity.;Long Term: Add in home exercise to make exercise part of routine and to increase amount of physical activity.;Long Term: Exercising regularly at least 3-5 days a week.       Increase Strength and Stamina Yes       Intervention Provide advice, education, support and counseling about physical activity/exercise needs.;Develop an individualized exercise prescription for aerobic and resistive training based on initial evaluation findings, risk stratification, comorbidities and participant's personal goals.       Expected Outcomes Short Term: Increase workloads from initial exercise prescription for resistance, speed, and METs.;Short Term: Perform resistance training exercises routinely during rehab and add in resistance training at home;Long Term: Improve cardiorespiratory fitness, muscular endurance and strength as measured by increased METs and functional capacity ( )       Able to understand and use rate of perceived exertion (RPE) scale Yes       Intervention Provide education and explanation on how to use RPE scale       Expected Outcomes Short Term: Able to use RPE daily in rehab to express subjective intensity level;Long Term:  Able to use RPE to guide intensity level when exercising independently       Able to understand and use Dyspnea scale Yes       Intervention Provide education and explanation on how to use Dyspnea scale       Expected Outcomes Short Term: Able to use Dyspnea scale daily in rehab to express subjective sense of shortness of breath during exertion;Long Term: Able to use Dyspnea scale to guide intensity level when exercising independently       Knowledge and understanding of Target Heart Rate Range (THRR) Yes       Intervention Provide education and explanation of THRR including how the numbers were predicted and where they  are located for reference       Expected Outcomes Short Term: Able to state/look up THRR;Long Term: Able to use THRR to govern intensity when exercising independently;Short Term: Able to use daily as guideline for intensity in rehab       Able to check pulse independently Yes       Intervention Provide education and demonstration on how to check pulse in carotid and radial arteries.;Review the importance of being able to check your own pulse for safety during independent exercise       Expected Outcomes Short Term: Able to explain why pulse checking is important during independent exercise       Understanding of Exercise Prescription Yes       Intervention Provide education, explanation, and written materials on patient's individual exercise prescription       Expected Outcomes Short Term: Able to explain program exercise prescription;Long Term: Able to explain home exercise prescription to exercise independently          Exercise Goals Re-Evaluation :  Exercise Goals Re-Evaluation     Row Name 02/18/24 1554 03/04/24 1649 03/16/24 1452 04/01/24 1751 04/07/24 1550     Exercise Goal Re-Evaluation   Exercise Goals Review Increase Physical Activity;Able to understand and use rate of perceived exertion (RPE) scale;Knowledge and understanding of Target Heart Rate Range (THRR);Understanding of Exercise Prescription;Increase Strength and Stamina;Able to understand and use Dyspnea scale;Able to check pulse independently Increase Physical Activity;Increase Strength and Stamina;Understanding of Exercise Prescription  Increase Physical Activity;Increase Strength and Stamina;Understanding of Exercise Prescription Increase Physical Activity;Increase Strength and Stamina;Understanding of Exercise Prescription Increase Physical Activity;Able to understand and use rate of perceived exertion (RPE) scale;Knowledge and understanding of Target Heart Rate Range (THRR);Understanding of Exercise Prescription;Increase  Strength and Stamina;Able to understand and use Dyspnea scale;Able to check pulse independently   Comments Reviewed RPE and dyspnea scale, THR and program prescription with pt today.  Pt voiced understanding and was given a copy of goals to take home. Analysa is off to a good start in the program. She was able to attend her first few sessions during this weeks review period. During these sessions she was able to increase from level 2 to 3 on the T4 nustep, and increase from level 2 to 4 on the XR. We will continue to monitor her progress in the program. Naziya is doing well in the program and only attended 2 sessions in this review. She maintained level 4 on the XR and a speed of 1.5 mph on the treadmiil with no incline. She also added the biostep at level 1 to her exercise prescription. We will continue to monitor her progress in the program. Austyn is doing well in the program and only attended 2 sessions in this review. She increased to level 4 on the T4 nustep. She also used the T6 nustep at level 3 and the treadmill at a speed of 1.3 mph with no incline. We will continue to monitor her progress in the program. Reviewed home exercise with pt today.  Pt plans to use hand weights and walk for exercise.  Reviewed THR, pulse, RPE, sign and symptoms, pulse oximetery and when to call 911 or MD.  Also discussed weather considerations and indoor options.  Pt voiced understanding.   Expected Outcomes Short: Use RPE daily to regulate intensity.  Long: Follow program prescription in THR. Short: Continue to follow exercise prescription. Long: Continue exercise to improve strength and stamina. Short: Attend rehab more consistently. Long: Continue exercise to improve strength and stamina. Short: Attend rehab more consistently. Long: Continue exercise to improve strength and stamina. Short: add 1-2 days a week of exercise at home on off days of rehab. Long: Maintain independent exercise routine.    Row Name 04/14/24 1549 04/28/24  1454 05/11/24 1346 05/19/24 1542 05/25/24 1135     Exercise Goal Re-Evaluation   Exercise Goals Review Increase Physical Activity;Increase Strength and Stamina;Understanding of Exercise Prescription Increase Physical Activity;Increase Strength and Stamina;Understanding of Exercise Prescription Increase Physical Activity;Increase Strength and Stamina;Understanding of Exercise Prescription Increase Physical Activity;Increase Strength and Stamina;Understanding of Exercise Prescription Increase Physical Activity;Increase Strength and Stamina;Understanding of Exercise Prescription   Comments Kaila is doing well in rehab. She was able to increase her treadmill speed from 1.3mph to 1.4mph. She was also able to maintain level 4 on the XR. We will continue to monitor her progress in the program. Tolulope continues to do well in rehab. She was only able to attend one session during this review period. During the one session she was able to increase her speed on the treadmill from 1.4 to 1.5 mph. We will continue to monitor her progress in the program. Chirsty continues to do well in rehab. She continues to walk on the treadmill at a speed of 1.4 mph with no incline. She also improved to level 2 on the biostep and continues to work at level 3 on the T6 nustep and level 4 on the XR. We will continue to monitor her progress  in the program. Leida reports that she does not do much structured exercise at home but tries to remain as active as she can with her house work. She was encouraged to try to attend pulmonary rehab consistently and to try to incorporate more more home activites as she feels she can tolerate it. She reports that her SOB is her main limiting factor. Blakely is doing well in rehab. She has been able to increase her speed on the treadmill from 1.4 to 1. with no incline. She was also able to maintain a level 4 on the XR. We will continue to monitor her progress in the program.   Expected Outcomes Short: Continue to  increase treadmill workloads. Long: Continue exercise to improve strength and stamina. Short: Continue to increase treadmill workloads. Long: Continue exercise to improve strength and stamina. Short: Continue to progressively increase treadmill workload. Long: Continue exercise to improve strength and stamina. Short: try to attend rehab consistently and add more activiity at home. Long: maintain an independent exercise routine upon graduation from rehab. Short: Continue to increase treadmill workload. Long: Continue exercise to improve strength and stamina.    Row Name 06/10/24 1354 06/24/24 1358 07/08/24 0809 07/22/24 0755 08/03/24 1511     Exercise Goal Re-Evaluation   Exercise Goals Review Increase Physical Activity;Increase Strength and Stamina;Understanding of Exercise Prescription Increase Physical Activity;Increase Strength and Stamina;Understanding of Exercise Prescription Increase Physical Activity;Increase Strength and Stamina;Understanding of Exercise Prescription Increase Physical Activity;Increase Strength and Stamina;Understanding of Exercise Prescription Increase Physical Activity;Increase Strength and Stamina;Understanding of Exercise Prescription   Comments Bies is doing well in rehab. She has been able to maintain her workload on the treadmill at 1. and 0% incline. She was also able to maintain level 4 on the Xr. We will continue to monitor her progress in the program. Oletha has only attended one session in rehab since the last review. She had a high blood pressure which caused her to have decreased workloads during her one session. We will encourage her to attend rehab more consistently and continue to monitor her progress in the program. Jessicia has not attended rehab since the last review. She last attended rehab on 07/30. We will contact her to see when she plans to return. We will continue to monitor her progress when she returns to the program. Olena has not attended rehab since 07/30.  We recently called her and she requested to be placed on medical hold at this time as she is having surgery on 09/18. We will continue to monitor her progress when she returns to the program. Magen has not attended rehab since 07/30. She has been on medical hold since having surgery on 09/18. We will continue to monitor her progress when she returns to the program.   Expected Outcomes Short: Work to increase treadmill workloads. Long: Continue exercise to improve strength and stamina. -- Short: Return to rehab when appropriate. Long: Graduate. Short: Return to rehab when appropriate. Long: Graduate. Short: Return to rehab when appropriate. Long: Graduate.      Discharge Exercise Prescription (Final Exercise Prescription Changes):  Exercise Prescription Changes - 06/24/24 1300       Response to Exercise   Blood Pressure (Admit) 144/82    Blood Pressure (Exercise) 158/70    Blood Pressure (Exit) 100/60    Heart Rate (Admit) 100 bpm    Heart Rate (Exercise) 104 bpm    Heart Rate (Exit) 100 bpm    Oxygen  Saturation (Admit) 98 %    Oxygen   Saturation (Exercise) 97 %    Oxygen  Saturation (Exit) 96 %    Rating of Perceived Exertion (Exercise) 12    Perceived Dyspnea (Exercise) 4    Symptoms none    Duration Continue with 30 min of aerobic exercise without signs/symptoms of physical distress.    Intensity THRR unchanged      Progression   Progression Continue to progress workloads to maintain intensity without signs/symptoms of physical distress.    Average METs 1.89      Resistance Training   Training Prescription Yes    Weight 3 lb    Reps 10-15      Interval Training   Interval Training No      Oxygen    Oxygen  Continuous    Liters 2      Treadmill   MPH 1    Grade 0    Minutes 15    METs 1.77      Biostep-RELP   Level 1    Minutes 15    METs 2      Home Exercise Plan   Plans to continue exercise at Home (comment)   weights and walking   Frequency Add 2 additional days  to program exercise sessions.    Initial Home Exercises Provided 04/07/24      Oxygen    Maintain Oxygen  Saturation 88% or higher          Nutrition:  Target Goals: Understanding of nutrition guidelines, daily intake of sodium 1500mg , cholesterol 200mg , calories 30% from fat and 7% or less from saturated fats, daily to have 5 or more servings of fruits and vegetables.  Education: Nutrition 1 -Group instruction provided by verbal, written material, interactive activities, discussions, models, and posters to present general guidelines for heart healthy nutrition including macronutrients, label reading, and promoting whole foods over processed counterparts. Education serves as Pensions consultant of discussion of heart healthy eating for all. Written material provided at class time.     Education: Nutrition 2 -Group instruction provided by verbal, written material, interactive activities, discussions, models, and posters to present general guidelines for heart healthy nutrition including sodium, cholesterol, and saturated fat. Providing guidance of habit forming to improve blood pressure, cholesterol, and body weight. Written material provided at class time.     Biometrics:  Pre Biometrics - 02/11/24 1535       Pre Biometrics   Height 5' 7.55 (1.716 m)    Weight 201 lb 11.2 oz (91.5 kg)    Waist Circumference 39.5 inches    Hip Circumference 43 inches    Waist to Hip Ratio 0.92 %    BMI (Calculated) 31.07    Single Leg Stand 7.34 seconds           Nutrition Therapy Plan and Nutrition Goals:   Nutrition Assessments:  MEDIFICTS Score Key: >=70 Need to make dietary changes  40-70 Heart Healthy Diet <= 40 Therapeutic Level Cholesterol Diet  Flowsheet Row Pulmonary Rehab from 02/13/2023 in [facility] Cardiac and Pulmonary Rehab  Picture Your Plate Total Score on Discharge 56   Picture Your Plate Scores: <59 Unhealthy dietary pattern with much room for improvement. 41-50 Dietary  pattern unlikely to meet recommendations for good health and room for improvement. 51-60 More healthful dietary pattern, with some room for improvement.  >60 Healthy dietary pattern, although there may be some specific behaviors that could be improved.   Nutrition Goals Re-Evaluation:  Nutrition Goals Re-Evaluation     Row Name 03/10/24 1535 04/01/24 1556 05/19/24 1537  Goals   Nutrition Goal Cydne has deferred to meet with the RD at this time. She said she may be open to meeting with him in the future. -- --     Comment -- Patient was informed on why it is important to maintain a balanced diet when dealing with Respiratory issues. Explained that it takes a lot of energy to breath and when they are short of breath often they will need to have a good diet to help keep up with the calories they are expending for breathing. Jecenia continues to [employer] [facility] appointment but states she is trying to eat as healthy as possible     Expected Outcome -- Short: Choose and plan snacks accordingly to patients caloric intake to improve breathing. Long: Maintain a diet independently that meets their caloric intake to aid in daily shortness of breath. --        Nutrition Goals Discharge (Final Nutrition Goals Re-Evaluation):  Nutrition Goals Re-Evaluation - 05/19/24 1537       Goals   Comment Shahrzad continues to [employer] [facility] appointment but states she is trying to eat as healthy as possible          Psychosocial: Target Goals: Acknowledge presence or absence of significant depression and/or stress, maximize coping skills, provide positive support system. Participant is able to verbalize types and ability to use techniques and skills needed for reducing stress and depression.   Education: Stress, Anxiety, and Depression - Group verbal and visual presentation to define topics covered.  Reviews how body is impacted by stress, anxiety, and depression.  Also discusses healthy ways to reduce stress and to  treat/manage anxiety and depression.  Written material provided at class time. Flowsheet Row Pulmonary Rehab from 01/29/2023 in [facility] Cardiac [facility] Pul[facility] Rehab  Date 01/22/23  Educator KW  Instruction Review Code 1- [employer] Understanding    Education: Sleep Hygiene -Provides group verbal and written instruction about how sleep can affect your health.  Define sleep hygiene, discuss sleep cycles and impact of sleep habits. Review good sleep hygiene tips.    Initial Review & Psychosocial Screening:   Quality of Life Scores:  Scores of 19 and below usually indicate a poorer quality of life in these areas.  A difference of  2-3 points is a clinically meaningful difference.  A difference of 2-3 points in the total score of the Quality of Life Index has been associated with significant improvement in overall quality of life, self-image, physical symptoms, and general health in studies assessing change in quality of life.  PHQ-9: Review Flowsheet  More data exists      02/11/2024 08/20/2023 02/13/2023 01/28/2023 11/18/2022  Depression screen PHQ 2/9  Decreased Interest 0 0 1 0 1  Down, Depressed, Hopeless 0 0 0 0 0  PHQ - 2 Score 0 0 1 0 1  Altered sleeping 0 1 0 0 0  Tired, decreased energy 0 1 3 3 3   Change in appetite 0 0 0 0 0  Feeling bad or failure about yourself  0 0 0 0 0  Trouble concentrating 0 0 0 0 0  Moving slowly or fidgety/restless 0 0 0 0 0  Suicidal thoughts 0 0 0 0 0  PHQ-9 Score 0 2 4 3 4   Difficult doing work/chores - - Somewhat difficult Not difficult at all -   Interpretation of Total Score  Total Score Depression Severity:  1-4 = Minimal depression, 5-9 = Mild depression, 10-14 = Moderate depression, 15-19 = Moderately  severe depression, 20-27 = Severe depression   Psychosocial Evaluation and Intervention:   Psychosocial Re-Evaluation:  Psychosocial Re-Evaluation     Row Name 03/10/24 1536 04/01/24 1557 05/19/24 1546         Psychosocial Re-Evaluation    Current issues with Current Stress Concerns;Current Sleep Concerns Current Stress Concerns;Current Sleep Concerns Current Stress Concerns;Current Sleep Concerns     Comments Sadonna reports no major stressors at this time. Keiley states that she is frustrated with her breathing as it has restricted her from doing the things she enjoys. She states that se struggles with staying asleep at this time. She has been taking tylenol  PM, but reports it only helps her sleep sometimes. Patient reports no issues with their current mental states, sleep, stress, depression or anxiety. Will follow up with patient in a few weeks for any changes. Patient reports no concerns with sleep, stress,  or mental health. She wants to try to come more consistently to pulmonary rehab for exercise and to try to help improve SOB.     Expected Outcomes Short: Continue to attend rehab for exercise and mental boost. Long: Maintain positive outlook. Short: Continue to exercise regularly to support mental health and notify staff of any changes. Long: maintain mental health and well being through teaching of rehab or prescribed medications independently. Short: consistently attend pulmonary rehab for mental health benefits of exercise. Long: maintain good mental health routine.     Interventions Encouraged to attend Pulmonary Rehabilitation for the exercise Encouraged to attend Pulmonary Rehabilitation for the exercise Encouraged to attend Pulmonary Rehabilitation for the exercise     Continue Psychosocial Services  No Follow up required Follow up required by staff Follow up required by staff       Initial Review   Source of Stress Concerns Chronic Illness -- --     Comments Her shortness of breath is her main stressor -- --        Psychosocial Discharge (Final Psychosocial Re-Evaluation):  Psychosocial Re-Evaluation - 05/19/24 1546       Psychosocial Re-Evaluation   Current issues with Current Stress Concerns;Current Sleep Concerns     Comments Patient reports no concerns with sleep, stress,  or mental health. She wants to try to come more consistently to pulmonary rehab for exercise and to try to help improve SOB.    Expected Outcomes Short: consistently attend pulmonary rehab for mental health benefits of exercise. Long: maintain good mental health routine.    Interventions Encouraged to attend Pulmonary Rehabilitation for the exercise    Continue Psychosocial Services  Follow up required by staff          Education: Education Goals: Education classes will be provided on a weekly basis, covering required topics. Participant will state understanding/return demonstration of topics presented.  Learning Barriers/Preferences:   General Pulmonary Education Topics:  Infection Prevention: - Provides verbal and written material to individual with discussion of infection control including proper hand washing and proper equipment cleaning during exercise session. Flowsheet Row Pulmonary Rehab from 02/11/2024 in [facility] Cardiac and Pulmonary Rehab  Date 02/11/24  Educator [facility]  Instruction Review Code 1- Verbalizes Understanding    Falls Prevention: - Provides verbal and written material to individual with discussion of falls prevention and safety. Flowsheet Row Pulmonary Rehab from 02/11/2024 in [facility] Cardiac and Pulmonary Rehab  Date 02/11/24  Educator [facility]  Instruction Review Code 1- Verbalizes Understanding    Chronic Lung Disease Review: - Group verbal instruction with posters, models, PowerPoint presentations  and videos,  to review new updates, new respiratory medications, new advancements in procedures and treatments. Providing information on websites and 800 numbers for continued self-education. Includes information about supplement oxygen , available portable oxygen  systems, continuous and intermittent flow rates, oxygen  safety, concentrators, and Medicare reimbursement for oxygen . Explanation of Pulmonary Drugs, including  class, frequency, complications, importance of spacers, rinsing mouth after steroid MDI's, and proper cleaning methods for nebulizers. Review of basic lung anatomy and physiology related to function, structure, and complications of lung disease. Review of risk factors. Discussion about methods for diagnosing sleep apnea and types of masks and machines for OSA. Includes a review of the use of types of environmental controls: home humidity, furnaces, filters, dust mite/pet prevention, HEPA vacuums. Discussion about weather changes, air quality and the benefits of nasal washing. Instruction on Warning signs, infection symptoms, calling MD promptly, preventive modes, and value of vaccinations. Review of effective airway clearance, coughing and/or vibration techniques. Emphasizing that all should Create an Action Plan. Written material provided at class time. Flowsheet Row Pulmonary Rehab from 01/29/2023 in [facility] Card[facility] and Pulmonary Rehab  Education need identified 11/18/22  Date 01/15/23  Educator [facility]  Instruction Review Code 1- Verbalizes Understanding    AED/CPR: - Group verbal and written instruction with the use of models to demonstrate the basic use of the AED with the basic ABC's of resuscitation.    Tests and Procedures:  - Group verbal and visual presentation and models provide information about basic cardiac anatomy and function. Reviews the testing methods done to diagnose heart disease and the outcomes of the test results. Describes the treatment choices: Medical Management, Angioplasty, or Coronary Bypass Surgery for treating various heart conditions including Myocardial Infarction, Angina, Valve Disease, and Cardiac Arrhythmias.  Written material provided at class time. Flowsheet Row Pulmonary Rehab from 01/29/2023 in [facility] Cardiac and Pulmonary Rehab  Date 12/18/22  Educator SB  Instruction Review Code 1- Verbalizes Understanding    Medication Safety: - Group verbal and visual  instruction to review commonly prescribed medications for heart and lung disease. Reviews the medication, class of the drug, and side effects. Includes the steps to properly store meds and maintain the prescription regimen.  Written material given at graduation.   Other: -Provides group and verbal instruction on various topics (see comments) Flowsheet Row Pulmonary Rehab from 01/29/2023 in [facility] Cardiac and Pulmonary Rehab  Date 12/25/22  Educator SB  Instruction Review Code 1- Verbalizes Understanding  [Jeopardy]    Knowledge Questionnaire Score:    Core Components/Risk Factors/Patient Goals at Admission:   Education:Diabetes - Individual verbal and written instruction to review signs/symptoms of diabetes, desired ranges of glucose level fasting, after meals and with exercise. Acknowledge that pre and post exercise glucose checks will be done for 3 sessions at entry of program.   Know Your Numbers and Heart Failure: - Group verbal and visual instruction to discuss disease risk factors for cardiac and pulmonary disease and treatment options.  Reviews associated critical values for Overweight/Obesity, Hypertension, Cholesterol, and Diabetes.  Discusses basics of heart failure: signs/symptoms and treatments.  Introduces Heart Failure Zone chart for action plan for heart failure. Written material provided at class time.   Core Components/Risk Factors/Patient Goals Review:   Goals and Risk Factor Review     Row Name 03/10/24 1541 04/01/24 1556 05/19/24 1539         Core Components/Risk Factors/Patient Goals Review   Personal Goals Review Weight Management/Obesity;Improve shortness of breath with ADL's Improve shortness of breath with  ADL's Hypertension     Review Ismerai states that she would still like to lose some weight. She would like to get down to 150 lbs but is restricted by her breathing and is not able to exercise as much as she feels she needs to. She is continuing to practice PLB  but states she still feels SOB. She reports still feeling SOB even when wearing her oxygen , although her O2 saturations have stayed above 90%. Spoke to patient about their shortness of breath and what they can do to improve. Patient has been informed of breathing techniques when starting the program. Patient is informed to tell staff if they have had any med changes and that certain meds they are taking or not taking can be causing shortness of breath. [place] states that she takes all her blood pressur medication and follows up with her doctor to manage this. She does report that she takes her BP at  home and it is usually good.     Expected Outcomes Short: Continue to practice breathing techniques to improve SOB. Long: Continue to manage lifestyle risk factors. Short: Attend [facility] regularly to improve shortness of breath with ADL's. Long: maintain independence with ADL's Short: continue to check BP at home. Long: work with medical team to continue to control blood pressure.        Core Components/Risk Factors/Patient Goals at Discharge (Final Review):   Goals and Risk Factor Review - 05/19/24 1539       Core Components/Risk Factors/Patient Goals Review   Personal Goals Review Hypertension    Review Chondra states that she takes all her blood pressur medication and follows up with her doctor to manage this. She does report that she takes her BP at  home and it is usually good.    Expected Outcomes Short: continue to check BP at home. Long: work with medical team to continue to control blood pressure.          ITP Comments:  ITP Comments     Row Name 02/11/24 1527 02/18/24 1125 02/18/24 1553 03/17/24 1301 04/14/24 1051   ITP Comments Completed and gym orientation. Initial ITP created and sent for review to Dr. Faud Aleskerov, Medical Director. 30 Day review completed. Medical Director ITP review done, changes made as directed, and signed approval by Medical Director.    new to program First  full day of exercise!  Patient was oriented to gym and equipment including functions, settings, policies, and procedures.  Patient's individual exercise prescription and treatment plan were reviewed.  All starting workloads were established based on the results of the 6 minute walk test done at initial orientation visit.  The plan for exercise progression was also introduced and progression will be customized based on patient's performance and goals. 30 Day review completed. Medical Director ITP review done, changes made as directed, and signed approval by Medical Director. New to program. 30 Day review completed. Medical Director ITP review done, changes made as directed, and signed approval by Medical Director.    Row Name 05/12/24 1057 06/09/24 1006 07/07/24 1027 08/04/24 1114     ITP Comments 30 Day review completed. Medical Director ITP review done, changes made as directed, and signed approval by Medical Director. 30 Day review completed. Medical Director ITP review done, changes made as directed, and signed approval by Medical Director. 30 Day review completed. Medical Director ITP review done, changes made as directed, and signed approval by Medical Director. 30 Day review completed. Medical Director ITP  review done, changes made as directed, and signed approval by Medical Director.       Comments: 30 day review

## 2024-08-05 ENCOUNTER — Ambulatory Visit

## 2024-08-11 ENCOUNTER — Other Ambulatory Visit: Payer: Self-pay | Admitting: Family Medicine

## 2024-08-11 ENCOUNTER — Ambulatory Visit

## 2024-08-12 ENCOUNTER — Ambulatory Visit

## 2024-08-18 ENCOUNTER — Ambulatory Visit

## 2024-08-19 ENCOUNTER — Ambulatory Visit

## 2024-08-25 DIAGNOSIS — Z952 Presence of prosthetic heart valve: Secondary | ICD-10-CM | POA: Diagnosis not present

## 2024-08-25 DIAGNOSIS — J449 Chronic obstructive pulmonary disease, unspecified: Secondary | ICD-10-CM | POA: Diagnosis not present

## 2024-08-25 DIAGNOSIS — I7103 Dissection of thoracoabdominal aorta: Secondary | ICD-10-CM | POA: Diagnosis not present

## 2024-08-25 DIAGNOSIS — I7123 Aneurysm of the descending thoracic aorta, without rupture: Secondary | ICD-10-CM | POA: Diagnosis not present

## 2024-08-25 DIAGNOSIS — R002 Palpitations: Secondary | ICD-10-CM | POA: Diagnosis not present

## 2024-08-25 DIAGNOSIS — G4733 Obstructive sleep apnea (adult) (pediatric): Secondary | ICD-10-CM | POA: Diagnosis not present

## 2024-08-25 DIAGNOSIS — R0602 Shortness of breath: Secondary | ICD-10-CM | POA: Diagnosis not present

## 2024-08-25 DIAGNOSIS — Z9889 Other specified postprocedural states: Secondary | ICD-10-CM | POA: Diagnosis not present

## 2024-08-25 DIAGNOSIS — Z23 Encounter for immunization: Secondary | ICD-10-CM | POA: Diagnosis not present

## 2024-08-25 DIAGNOSIS — Z8679 Personal history of other diseases of the circulatory system: Secondary | ICD-10-CM | POA: Diagnosis not present

## 2024-08-25 DIAGNOSIS — I10 Essential (primary) hypertension: Secondary | ICD-10-CM | POA: Diagnosis not present

## 2024-08-25 DIAGNOSIS — I7121 Aneurysm of the ascending aorta, without rupture: Secondary | ICD-10-CM | POA: Diagnosis not present

## 2024-08-30 ENCOUNTER — Other Ambulatory Visit: Payer: Self-pay | Admitting: Dermatology

## 2024-09-01 ENCOUNTER — Encounter: Payer: Self-pay | Admitting: *Deleted

## 2024-09-01 DIAGNOSIS — J449 Chronic obstructive pulmonary disease, unspecified: Secondary | ICD-10-CM

## 2024-09-01 NOTE — Progress Notes (Signed)
 Pulmonary Individual Treatment Plan  Patient Details  Name: Nancy [name] MRN: [id number] Date of Birth: 06/27/1961 Referring Provider:   Flowsheet Row Pulmonary Rehab from 02/11/2024 in [facility] Cardiac and Pulmonary Rehab  Referring Provider Dr. Lavelle Servant    Initial Encounter Date:  Flowsheet Row Pulmonary Rehab from 02/11/2024 in [facility] Cardiac and Pulmonary Rehab  Date 02/11/24    Visit Diagnosis: Stage 3 severe COPD by GOLD classification (HCC)  Patient's Home Medications on Admission:  Current Outpatient Medications:    acetaminophen  (TYLENOL ) 500 MG tablet, Take 500 mg by mouth every 4 (four) hours as needed., Disp: , Rfl:    albuterol  (VENTOLIN  HFA) 108 (90 Base) MCG/ACT inhaler, SMARTSIG:2 Inhalation Via Inhaler Every 6 Hours PRN, Disp: , Rfl:    amLODipine  (NORVASC ) 5 MG tablet, Take 5 mg by mouth daily., Disp: , Rfl:    benzonatate  (TESSALON ) 200 MG capsule, Take 1 capsule (200 mg total) by mouth 2 (two) times daily as needed for cough., Disp: 20 capsule, Rfl: 0   diclofenac  Sodium (VOLTAREN ) 1 % GEL, Apply 2 g topically 4 (four) times daily., Disp: 100 g, Rfl: 0   doxycycline  (PERIOSTAT ) 20 MG tablet, Take one tab po BID with food., Disp: 60 tablet, Rfl: 11   DULoxetine  (CYMBALTA ) 20 MG capsule, TAKE 2 CAPSULES BY MOUTH TWICE DAILY, Disp: 360 capsule, Rfl: 0   enoxaparin  (LOVENOX ) 100 MG/ML injection, Inject into the skin. (Patient not taking: Reported on 12/24/2023), Disp: , Rfl:    enoxaparin  (LOVENOX ) 40 MG/0.4ML injection, Inject 90 mg into the skin daily. (Patient not taking: Reported on 12/24/2023), Disp: , Rfl:    Fluticasone -Umeclidin-Vilant (TRELEGY ELLIPTA ) 100-62.5-25 MCG/ACT AEPB, Inhale into the lungs., Disp: , Rfl:    furosemide  (LASIX ) 40 MG tablet, Take 40 mg by mouth 2 (two) times daily. (Patient not taking: Reported on 12/24/2023), Disp: , Rfl:    furosemide  (LASIX ) 40 MG tablet, Take 1 tablet by mouth 2 (two) times daily., Disp: , Rfl:    ipratropium-albuterol   (DUONEB) 0.5-2.5 (3) MG/3ML SOLN, Inhale into the lungs., Disp: , Rfl:    levofloxacin  (LEVAQUIN ) 750 MG tablet, Take 1 tablet (750 mg total) by mouth daily., Disp: 7 tablet, Rfl: 0   levothyroxine  (SYNTHROID ) 125 MCG tablet, Take 1 tablet (125 mcg total) by mouth daily before breakfast., Disp: 90 tablet, Rfl: 3   lidocaine  (LIDODERM ) 5 %, Place 1 patch onto the skin every 12 (twelve) hours. Remove & Discard patch within 12 hours or as directed by MD, Disp: 15 patch, Rfl: 0   metoprolol  succinate (TOPROL -XL) 25 MG 24 hr tablet, Take 25 mg by mouth daily., Disp: , Rfl:    Na Sulfate-K Sulfate-Mg Sulfate concentrate 17.5-3.13-1.6 GM/177ML SOLN, , Disp: , Rfl:    ondansetron  (ZOFRAN  ODT) 4 MG disintegrating tablet, Take 1 tablet (4 mg total) by mouth every 8 (eight) hours as needed for nausea or vomiting., Disp: 20 tablet, Rfl: 2   oxyCODONE  (OXY IR/ROXICODONE ) 5 MG immediate release tablet, Take 1 tablet (5 mg total) by mouth every 6 (six) hours as needed for moderate pain (pain score 4-6)., Disp: 8 tablet, Rfl: 0   polyethylene glycol (MIRALAX  / GLYCOLAX ) 17 g packet, Take 17 g by mouth daily as needed., Disp: , Rfl:    senna-docusate (SENOKOT-S) 8.6-50 MG tablet, Take 1 tablet by mouth 2 (two) times daily as needed for mild constipation., Disp: 20 tablet, Rfl: 0   TRELEGY ELLIPTA  100-62.5-25 MCG/ACT AEPB, Inhale into the lungs., Disp: , Rfl:  valACYclovir  (VALTREX ) 1000 MG tablet, TAKE 1 TABLET BY MOUTH DAILY AS DIRECTED., Disp: 6 tablet, Rfl: 0   warfarin (COUMADIN ) 5 MG tablet, Take 5 mg by mouth every evening. Per cardiologist: patient supposed to take 7.5 mg 9/24 and 9/25 evening for low INR then resume 5 mg nightly  Goal INR 2.5-3.5, Disp: , Rfl:   Current Facility-Administered Medications:    ipratropium-albuterol  (DUONEB) 0.5-2.5 (3) MG/3ML nebulizer solution 3 mL, 3 mL, Nebulization, Once, Pollak, Adriana M, PA-C  Past Medical History: Past Medical History:  Diagnosis Date   Aortic  dissection (HCC) 2010   COPD (chronic obstructive pulmonary disease) (HCC)    Depression    Hypertension    Hypothyroidism    Pneumonia    PONV (postoperative nausea and vomiting)    Rosacea    Seasonal allergies    Sleep apnea    Does not use CPAP    Tobacco Use: Social History   Tobacco Use  Smoking Status Former   Current packs/day: 0.00   Average packs/day: 0.5 packs/day for 30.0 years (15.0 ttl pk-yrs)   Types: Cigarettes   Start date: 03/11/1986   Quit date: 03/11/2016   Years since quitting: 8.4  Smokeless Tobacco Never  Tobacco Comments   patient states  that she is working at her own pace to quit smoking    Labs: Review Flowsheet  More data may exist      Latest Ref Rng & Units 07/02/2009 08/29/2015 01/05/2018 09/29/2020 08/20/2023  Labs for ITP Cardiac and Pulmonary Rehab  Cholestrol 100 - 199 mg/dL - 823  814  832  837   LDL (calc) 0 - 99 mg/dL - 896  888  898  894   HDL-C >39 mg/dL - 53  50  45  38   Trlycerides 0 - 149 mg/dL - 897  880  883  894   Hemoglobin A1c 4.8 - 5.6 % - - - - 4.8   TCO2 0 - 100 mmol/L 22  - - - -     Pulmonary Assessment Scores:   UCSD: Self-administered rating of dyspnea associated with activities of daily living (ADLs) 6-point scale (0 = not at all to 5 = maximal or unable to do because of breathlessness)  Scoring Scores range from 0 to 120.  Minimally important difference is 5 units  CAT: CAT can identify the health impairment of COPD patients and is better correlated with disease progression.  CAT has a scoring range of zero to 40. The CAT score is classified into four groups of low (less than 10), medium (10 - 20), high (21-30) and very high (31-40) based on the impact level of disease on health status. A CAT score over 10 suggests significant symptoms.  A worsening CAT score could be explained by an exacerbation, poor medication adherence, poor inhaler technique, or progression of COPD or comorbid conditions.  CAT MCID is 2  points  mMRC: mMRC (Modified Medical Research Council) Dyspnea Scale is used to assess the degree of baseline functional disability in patients of respiratory disease due to dyspnea. No minimal important difference is established. A decrease in score of 1 point or greater is considered a positive change.   Pulmonary Function Assessment:   Exercise Target Goals: Exercise Program Goal: Individual exercise prescription set using results from initial 6 min walk test and THRR while considering  patient's activity barriers and safety.   Exercise Prescription Goal: Initial exercise prescription builds to 30-45 minutes a day of aerobic activity,  2-3 days per week.  Home exercise guidelines will be given to patient during program as part of exercise prescription that the participant will acknowledge.  Education: Aerobic Exercise: - Group verbal and visual presentation on the components of exercise prescription. Introduces F.I.T.T principle from ACSM for exercise prescriptions.  Reviews F.I.T.T. principles of aerobic exercise including progression. Written material provided at class time. Flowsheet Row Pulmonary Rehab from 01/29/2023 in [facility] Cardiac and Pulmonary Rehab  Date 11/27/22  Educator KW  Instruction Review Code 1- [employer] Understanding    Education: Resistance Exercise: - Group verbal and visual presentation on the components of exercise prescription. Introduces F.I.T.T principle from ACSM for exercise prescriptions  Reviews F.I.T.T. principles of resistance exercise including progression. Written material provided at class time. Flowsheet Row Pulmonary Rehab from 01/29/2023 in [facility] Cardiac and Pulmonary Rehab  Date 12/04/22  Educator KW  Instruction Review Code 1- [employer] Understanding     Education: Exercise & Equipment Safety: - Individual verbal instruction and demonstration of equipment use and safety with use of the equipment. Flowsheet Row Pulmonary Rehab from 02/11/2024 in  [facility] Cardiac and Pulmonary Rehab  Date 02/11/24  Educator [facility]  Instruction Review Code 1- Verbalizes Understanding    Education: Exercise Physiology & General Exercise Guidelines: - Group verbal and written instruction with models to review the exercise physiology of the cardiovascular system and associated critical values. Provides general exercise guidelines with specific guidelines to those with heart or lung disease.    Education: Flexibility, Balance, Mind/Body Relaxation: - Group verbal and visual presentation with interactive activity on the components of exercise prescription. Introduces F.I.T.T principle from ACSM for exercise prescriptions. Reviews F.I.T.T. principles of flexibility and balance exercise training including progression. Also discusses the mind body connection.  Reviews various relaxation techniques to help reduce and manage stress (i.e. Deep breathing, progressive muscle relaxation, and visualization). Balance handout provided to take home. Written material provided at class time. Flowsheet Row Pulmonary Rehab from 01/29/2023 in [facility] Cardiac and Pulmonary Rehab  Date 12/11/22  Educator KW  Instruction Review Code 1- Verbalizes Understanding    Activity Barriers & Risk Stratification:   6 Minute Walk:  Oxygen  Initial Assessment:   Oxygen  Re-Evaluation:  Oxygen  Re-Evaluation     Row Name 03/10/24 1546 04/01/24 1558 05/05/24 1535 05/19/24 1548       Program Oxygen  Prescription   Program Oxygen  Prescription Continuous Continuous Continuous Continuous    Liters per minute 2 2 2 2       Home Oxygen    Home Oxygen  Device Portable Concentrator;Home Concentrator Portable Concentrator;Home Concentrator Portable Concentrator;Home Concentrator Portable Concentrator;Home Concentrator    Sleep Oxygen  Prescription Continuous Continuous;CPAP  Nancy [name] states she cannot tolerate it. Continuous;CPAP  does not tolerate Continuous;CPAP    Liters per minute 2 2 2 2     Home  Exercise Oxygen  Prescription Continuous Continuous Continuous Continuous    Liters per minute 2 2 2 2     Home Resting Oxygen  Prescription None None None None    Compliance with Home Oxygen  Use -- No No No    Comments -- -- --  does not wear CPAP --      Goals/Expected Outcomes   Short Term Goals To learn and exhibit compliance with exercise, home and travel O2 prescription;To learn and understand importance of monitoring SPO2 with pulse oximeter and demonstrate accurate use of the pulse oximeter.;To learn and demonstrate proper pursed lip breathing techniques or other breathing techniques. ;To learn and demonstrate proper use of respiratory medications;To learn and understand  importance of maintaining oxygen  saturations>88% Other;To learn and understand importance of maintaining oxygen  saturations>88% Other To learn and understand importance of monitoring SPO2 with pulse oximeter and demonstrate accurate use of the pulse oximeter.;To learn and exhibit compliance with exercise, home and travel O2 prescription    Long  Term Goals Exhibits compliance with exercise, home  and travel O2 prescription;Maintenance of O2 saturations>88%;Compliance with respiratory medication;Demonstrates proper use of MDI's;Exhibits proper breathing techniques, such as pursed lip breathing or other method taught during program session;Verbalizes importance of monitoring SPO2 with pulse oximeter and return demonstration Other;Maintenance of O2 saturations>88% Other Exhibits compliance with exercise, home  and travel O2 prescription;Verbalizes importance of monitoring SPO2 with pulse oximeter and return demonstration    Comments Birda continues to practice PLB technique, although she reports that she is still SOB even when wearing her O2. She states that she feels the same SOB while wearing her oxygen  even though her O2 saturations have stayed above 90% while on continuous O2. Talked about how PLB works and it's importance in  maintaining their exercise saturations. Yerlin states that her concentrator does not do anything for her shortness of breath so she does not use it when she is out and about. Informed her the differences in shortness of breath and oxygenation. Patient verbalizes understanding. She states she does not tolerate wearing any of her CPAP mask. Navaeh states that she is not wearing her CPAP due to it falling off her face. She states it does not stay on so she does not wear it. Informed patient why it is important to wear it. She states aving about  five different mask and nothing is working for her. Jasmine report that she has been using her oxygen  at night again. She does not use oxygen  during the day but just stops and rests when she gets SOB. PLB was reviewed and patient states that she does use PLB at home to help control SOB. She does have a pulse ox at home and and does occationally monitor SaO2. She states her oxygen  levels are good at rest but do drop sometimes with active.    Goals/Expected Outcomes Short: Become more profiecient at using PLB.   Long: Become independent at using PLB. Short: maintain spo2 greater than 88 percent on exertion. Long: maintain oxygen  saturation independently. Short: give her CPAP another try. Long: adhere to using a CPAP. Short: continue to use oygen at night. Long: adhere to using CPAP long term.       Oxygen  Discharge (Final Oxygen  Re-Evaluation):  Oxygen  Re-Evaluation - 05/19/24 1548       Program Oxygen  Prescription   Program Oxygen  Prescription Continuous    Liters per minute 2      Home Oxygen    Home Oxygen  Device Portable Concentrator;Home Concentrator    Sleep Oxygen  Prescription Continuous;CPAP    Liters per minute 2    Home Exercise Oxygen  Prescription Continuous    Liters per minute 2    Home Resting Oxygen  Prescription None    Compliance with Home Oxygen  Use No      Goals/Expected Outcomes   Short Term Goals To learn and understand importance of monitoring  SPO2 with pulse oximeter and demonstrate accurate use of the pulse oximeter.;To learn and exhibit compliance with exercise, home and travel O2 prescription    Long  Term Goals Exhibits compliance with exercise, home  and travel O2 prescription;Verbalizes importance of monitoring SPO2 with pulse oximeter and return demonstration    Comments Maxie report that she  has been using her oxygen  at night again. She does not use oxygen  during the day but just stops and rests when she gets SOB. PLB was reviewed and patient states that she does use PLB at home to help control SOB. She does have a pulse ox at home and and does occationally monitor SaO2. She states her oxygen  levels are good at rest but do drop sometimes with active.    Goals/Expected Outcomes Short: continue to use oygen at night. Long: adhere to using CPAP long term.          Initial Exercise Prescription:   Perform Capillary Blood Glucose checks as needed.  Exercise Prescription Changes:   Exercise Prescription Changes     Row Name 03/16/24 1400 04/01/24 1700 04/07/24 1500 04/14/24 1500 04/28/24 1400     Response to Exercise   Blood Pressure (Admit) 138/66 120/70 -- 138/66 112/68   Blood Pressure (Exercise) -- 146/72 -- 124/80 --   Blood Pressure (Exit) 118/60 118/88 -- 124/68 102/60   Heart Rate (Admit) 106 bpm 109 bpm -- 103 bpm 95 bpm   Heart Rate (Exercise) 117 bpm 120 bpm -- 114 bpm 110 bpm   Heart Rate (Exit) 101 bpm 108 bpm -- 105 bpm 105 bpm   Oxygen  Saturation (Admit) 98 % 97 % -- 97 % 96 %   Oxygen  Saturation (Exercise) 94 % 95 % -- 94 % 94 %   Oxygen  Saturation (Exit) 97 % 96 % -- 97 % 98 %   Rating of Perceived Exertion (Exercise) 13 13 -- 13 13   Perceived Dyspnea (Exercise) 3 3 -- 4 3   Symptoms none none -- none none   Duration Continue with 30 min of aerobic exercise without signs/symptoms of physical distress. Continue with 30 min of aerobic exercise without signs/symptoms of physical distress. Continue with 30  min of aerobic exercise without signs/symptoms of physical distress. Continue with 30 min of aerobic exercise without signs/symptoms of physical distress. Continue with 30 min of aerobic exercise without signs/symptoms of physical distress.   Intensity THRR unchanged THRR unchanged THRR unchanged THRR unchanged THRR unchanged     Progression   Progression Continue to progress workloads to maintain intensity without signs/symptoms of physical distress. Continue to progress workloads to maintain intensity without signs/symptoms of physical distress. Continue to progress workloads to maintain intensity without signs/symptoms of physical distress. Continue to progress workloads to maintain intensity without signs/symptoms of physical distress. Continue to progress workloads to maintain intensity without signs/symptoms of physical distress.   Average METs 2.33 2.02 2.02 2.3 2.22     Resistance Training   Training Prescription Yes Yes Yes Yes Yes   Weight 3 3 3 3 3    Reps 10-15 10-15 10-15 10-15 10-15     Interval Training   Interval Training No No No No No     Oxygen    Oxygen  Continuous Continuous Continuous Continuous Continuous   Liters 2 2 2 2 2      Treadmill   MPH 1.5 1.3 1.3 1.4 1.5   Grade 0 0 0 0 0   Minutes 15 15 15 15 15    METs 2.15 1.99 1.99 2.07 2.15     NuStep   Level -- 4 4 -- 3   Minutes -- 15 15 -- 15   METs -- 2.1 2.1 -- 2.3     REL-XR   Level 4 -- -- 4 --   Minutes 15 -- -- 15 --   METs 3 -- --  3.3 --     T5 Nustep   Level -- 3  T6 3  T6 -- --   SPM -- 80 80 -- --   Minutes -- 15 15 -- --   METs -- 2 2 -- --     Biostep-RELP   Level 1 -- -- 1 --   Minutes 15 -- -- 15 --   METs 2 -- -- 2 --     Home Exercise Plan   Plans to continue exercise at -- -- Home (comment)  weights and walking Home (comment)  weights and walking Home (comment)  weights and walking   Frequency -- -- Add 2 additional days to program exercise sessions. Add 2 additional days to program  exercise sessions. Add 2 additional days to program exercise sessions.   Initial Home Exercises Provided -- -- 04/07/24 04/07/24 04/07/24     Oxygen    Maintain Oxygen  Saturation 88% or higher 88% or higher 88% or higher 88% or higher 88% or higher    Row Name 05/11/24 1300 05/25/24 1100 06/10/24 1300 06/24/24 1300       Response to Exercise   Blood Pressure (Admit) 132/76 122/80 132/78 144/82    Blood Pressure (Exercise) -- -- -- 158/70    Blood Pressure (Exit) 106/64 120/60 102/52 100/60    Heart Rate (Admit) 91 bpm 91 bpm 92 bpm 100 bpm    Heart Rate (Exercise) 101 bpm 100 bpm 104 bpm 104 bpm    Heart Rate (Exit) 98 bpm 94 bpm 95 bpm 100 bpm    Oxygen  Saturation (Admit) 95 % 97 % 91 % 98 %    Oxygen  Saturation (Exercise) 94 % 95 % 91 % 97 %    Oxygen  Saturation (Exit) 96 % 97 % 97 % 96 %    Rating of Perceived Exertion (Exercise) 12 12 12 12     Perceived Dyspnea (Exercise) 3.5 3 3 4     Symptoms none none none none    Duration Continue with 30 min of aerobic exercise without signs/symptoms of physical distress. Continue with 30 min of aerobic exercise without signs/symptoms of physical distress. Continue with 30 min of aerobic exercise without signs/symptoms of physical distress. Continue with 30 min of aerobic exercise without signs/symptoms of physical distress.    Intensity THRR unchanged THRR unchanged THRR unchanged THRR unchanged      Progression   Progression Continue to progress workloads to maintain intensity without signs/symptoms of physical distress. Continue to progress workloads to maintain intensity without signs/symptoms of physical distress. Continue to progress workloads to maintain intensity without signs/symptoms of physical distress. Continue to progress workloads to maintain intensity without signs/symptoms of physical distress.    Average METs 2.29 2.5 2 1.89      Resistance Training   Training Prescription Yes Yes Yes Yes    Weight 3 lb 3 lb 3 lb 3 lb    Reps  10-15 10-15 10-15 10-15      Interval Training   Interval Training No No No No      Oxygen    Oxygen  Continuous Continuous Continuous Continuous    Liters 2 2 2 2       Treadmill   MPH 1.4 1.5 1.5 1    Grade 0 0 0 0    Minutes 15 15 15 15     METs 2.07 2.15 2.15 1.77      NuStep   Level -- -- 1  T6 --    Minutes -- -- 15 --  METs -- -- 1.9 --      REL-XR   Level 4 4 4  --    Minutes 15 15 15  --    METs 3.5 3.7 -- --      T5 Nustep   Level 3  T6 nustep -- -- --    Minutes 15 -- -- --    METs 2 -- -- --      Biostep-RELP   Level 2 -- -- 1    Minutes 15 -- -- 15    METs 2 -- -- 2      Home Exercise Plan   Plans to continue exercise at Home (comment)  weights and walking Home (comment)  weights and walking Home (comment)  weights and walking Home (comment)  weights and walking    Frequency Add 2 additional days to program exercise sessions. Add 2 additional days to program exercise sessions. Add 2 additional days to program exercise sessions. Add 2 additional days to program exercise sessions.    Initial Home Exercises Provided 04/07/24 04/07/24 04/07/24 04/07/24      Oxygen    Maintain Oxygen  Saturation 88% or higher 88% or higher 88% or higher 88% or higher       Exercise Comments:   Exercise Goals and Review:   Exercise Goals Re-Evaluation :  Exercise Goals Re-Evaluation     Row Name 03/16/24 1452 04/01/24 1751 04/07/24 1550 04/14/24 1549 04/28/24 1454     Exercise Goal Re-Evaluation   Exercise Goals Review Increase Physical Activity;Increase Strength and Stamina;Understanding of Exercise Prescription Increase Physical Activity;Increase Strength and Stamina;Understanding of Exercise Prescription Increase Physical Activity;Able to understand and use rate of perceived exertion (RPE) scale;Knowledge and understanding of Target Heart Rate Range (THRR);Understanding of Exercise Prescription;Increase Strength and Stamina;Able to understand and use Dyspnea scale;Able to  check pulse independently Increase Physical Activity;Increase Strength and Stamina;Understanding of Exercise Prescription Increase Physical Activity;Increase Strength and Stamina;Understanding of Exercise Prescription   Comments Yuliya is doing well in the program and only attended 2 sessions in this review. She maintained level 4 on the XR and a speed of 1.5 mph on the treadmiil with no incline. She also added the biostep at level 1 to her exercise prescription. We will continue to monitor her progress in the program. Aly is doing well in the program and only attended 2 sessions in this review. She increased to level 4 on the T4 nustep. She also used the T6 nustep at level 3 and the treadmill at a speed of 1.3 mph with no incline. We will continue to monitor her progress in the program. Reviewed home exercise with pt today.  Pt plans to use hand weights and walk for exercise.  Reviewed THR, pulse, RPE, sign and symptoms, pulse oximetery and when to call 911 or MD.  Also discussed weather considerations and indoor options.  Pt voiced understanding. Kla is doing well in rehab. She was able to increase her treadmill speed from 1.3mph to 1.4mph. She was also able to maintain level 4 on the XR. We will continue to monitor her progress in the program. [place] continues to do well in rehab. She was only able to attend one session during this review period. During the one session she was able to increase her speed on the treadmill from 1.4 to 1.5 mph. We will continue to monitor her progress in the program.   Expected Outcomes Short: Attend rehab more consistently. Long: Continue exercise to improve strength and stamina. Short: Attend rehab more consistently.  Long: Continue exercise to improve strength and stamina. Short: add 1-2 days a week of exercise at home on off days of rehab. Long: Maintain independent exercise routine. Short: Continue to increase treadmill workloads. Long: Continue exercise to improve strength and  stamina. Short: Continue to increase treadmill workloads. Long: Continue exercise to improve strength and stamina.    Row Name 05/11/24 1346 05/19/24 1542 05/25/24 1135 06/10/24 1354 06/24/24 1358     Exercise Goal Re-Evaluation   Exercise Goals Review Increase Physical Activity;Increase Strength and Stamina;Understanding of Exercise Prescription Increase Physical Activity;Increase Strength and Stamina;Understanding of Exercise Prescription Increase Physical Activity;Increase Strength and Stamina;Understanding of Exercise Prescription Increase Physical Activity;Increase Strength and Stamina;Understanding of Exercise Prescription Increase Physical Activity;Increase Strength and Stamina;Understanding of Exercise Prescription   Comments Nancy [name] continues to do well in rehab. She continues to walk on the treadmill at a speed of 1.4 mph with no incline. She also improved to level 2 on the biostep and continues to work at level 3 on the T6 nustep and level 4 on the XR. We will continue to monitor her progress in the program. Nancy [name] reports that she does not do much structured exercise at home but tries to remain as active as she can with her house work. She was encouraged to try to attend pulmonary rehab consistently and to try to incorporate more more home activites as she feels she can tolerate it. She reports that her SOB is her main limiting factor. Nancy [name] is doing well in rehab. She has been able to increase her speed on the treadmill from 1.4 to 1. with no incline. She was also able to maintain a level 4 on the XR. We will continue to monitor her progress in the program. Nancy [name] is doing well in rehab. She has been able to maintain her workload on the treadmill at 1. and 0% incline. She was also able to maintain level 4 on the Xr. We will continue to monitor her progress in the program. Nancy [name] has only attended one session in rehab since the last review. She had a high blood pressure which caused her to have  decreased workloads during her one session. We will encourage her to attend rehab more consistently and continue to monitor her progress in the program.   Expected Outcomes Short: Continue to progressively increase treadmill workload. Long: Continue exercise to improve strength and stamina. Short: try to attend rehab consistently and add more activiity at home. Long: maintain an independent exercise routine upon graduation from rehab. Short: Continue to increase treadmill workload. Long: Continue exercise to improve strength and stamina. Short: Work to increase treadmill workloads. Long: Continue exercise to improve strength and stamina. --    Row Name 07/08/24 0809 07/22/24 0755 08/03/24 1511 08/18/24 1146       Exercise Goal Re-Evaluation   Exercise Goals Review Increase Physical Activity;Increase Strength and Stamina;Understanding of Exercise Prescription Increase Physical Activity;Increase Strength and Stamina;Understanding of Exercise Prescription Increase Physical Activity;Increase Strength and Stamina;Understanding of Exercise Prescription Increase Physical Activity;Increase Strength and Stamina;Understanding of Exercise Prescription    Comments Nancy [name] has not attended rehab since the last review. She last attended rehab on 07/30. We will contact her to see when she plans to return. We will continue to monitor her progress when she returns to the program. Nancy [name] has not attended rehab since 07/30. We recently called her and she requested to be placed on medical hold at this time as she is having surgery on 09/18. We will continue to monitor  her progress when she returns to the program. Nancy [name] has not attended rehab since 07/30. She has been on medical hold since having surgery on 09/18. We will continue to monitor her progress when she returns to the program. Nancy [name] continues to be on medical hold at this time. She has not attended rehab since 07/30. She recently called and asked to wait a few more weeks  before returning to rehab until after she talks to her doctor. We will continue to monitor her progress when she returns to the program.    Expected Outcomes Short: Return to rehab when appropriate. Long: Graduate. Short: Return to rehab when appropriate. Long: Graduate. Short: Return to rehab when appropriate. Long: Graduate. Short: Return to rehab when appropriate. Long: Graduate.       Discharge Exercise Prescription (Final Exercise Prescription Changes):  Exercise Prescription Changes - 06/24/24 1300       Response to Exercise   Blood Pressure (Admit) 144/82    Blood Pressure (Exercise) 158/70    Blood Pressure (Exit) 100/60    Heart Rate (Admit) 100 bpm    Heart Rate (Exercise) 104 bpm    Heart Rate (Exit) 100 bpm    Oxygen  Saturation (Admit) 98 %    Oxygen  Saturation (Exercise) 97 %    Oxygen  Saturation (Exit) 96 %    Rating of Perceived Exertion (Exercise) 12    Perceived Dyspnea (Exercise) 4    Symptoms none    Duration Continue with 30 min of aerobic exercise without signs/symptoms of physical distress.    Intensity THRR unchanged      Progression   Progression Continue to progress workloads to maintain intensity without signs/symptoms of physical distress.    Average METs 1.89      Resistance Training   Training Prescription Yes    Weight 3 lb    Reps 10-15      Interval Training   Interval Training No      Oxygen    Oxygen  Continuous    Liters 2      Treadmill   MPH 1    Grade 0    Minutes 15    METs 1.77      Biostep-RELP   Level 1    Minutes 15    METs 2      Home Exercise Plan   Plans to continue exercise at Home (comment)   weights and walking   Frequency Add 2 additional days to program exercise sessions.    Initial Home Exercises Provided 04/07/24      Oxygen    Maintain Oxygen  Saturation 88% or higher          Nutrition:  Target Goals: Understanding of nutrition guidelines, daily intake of sodium 1500mg , cholesterol 200mg , calories  30% from fat and 7% or less from saturated fats, daily to have 5 or more servings of fruits and vegetables.  Education: Nutrition 1 -Group instruction provided by verbal, written material, interactive activities, discussions, models, and posters to present general guidelines for heart healthy nutrition including macronutrients, label reading, and promoting whole foods over processed counterparts. Education serves as Pensions consultant of discussion of heart healthy eating for all. Written material provided at class time.     Education: Nutrition 2 -Group instruction provided by verbal, written material, interactive activities, discussions, models, and posters to present general guidelines for heart healthy nutrition including sodium, cholesterol, and saturated fat. Providing guidance of habit forming to improve blood pressure, cholesterol, and body weight. Written material provided at class time.  Biometrics:    Nutrition Therapy Plan and Nutrition Goals:   Nutrition Assessments:  MEDIFICTS Score Key: >=70 Need to make dietary changes  40-70 Heart Healthy Diet <= 40 Therapeutic Level Cholesterol Diet  Flowsheet Row Pulmonary Rehab from 02/13/2023 in [facility] Card[facility] Pulmonary Rehab  Picture Your Plate Total Score on Discharge 56   Picture Your Plate Scores: <59 Unhealthy dietary pattern with much room for improvement. 41-50 Dietary pattern unlikely to meet recommendations for good health and room for improvement. 51-60 More healthful dietary pattern, with some room for improvement.  >60 Healthy dietary pattern, although there may be some specific behaviors that could be improved.   Nutrition Goals Re-Evaluation:  Nutrition Goals Re-Evaluation     Row Name 03/10/24 1535 04/01/24 1556 05/19/24 1537         Goals   Nutrition Goal Nancy [name] has deferred to meet with the RD at this time. She said she may be open to meeting with him in the future. -- --     Comment -- Patient was informed  on why it is important to maintain a balanced diet when dealing with Respiratory issues. Explained that it takes a lot of energy to breath and when they are short of breath often they will need to have a good diet to help keep up with the calories they are expending for breathing. Jashanti continues to [employer] appointment but states she is trying to eat as healthy as possible     Expected Outcome -- Short: Choose and plan snacks accordingly to patients caloric intake to improve breathing. Long: Maintain a diet independently that meets their caloric intake to aid in daily shortness of breath. --        Nutrition Goals Discharge (Final Nutrition Goals Re-Evaluation):  Nutrition Goals Re-Evaluation - 05/19/24 1537       Goals   Comment Nancy [name] continues to [employer] [facility] appointment but states she is trying to eat as healthy as possible          Psychosocial: Target Goals: Acknowledge presence or absence of significant depression and/or stress, maximize coping skills, provide positive support system. Participant is able to verbalize types and ability to use techniques and skills needed for reducing stress and depression.   Education: Stress, Anxiety, and Depression - Group verbal and visual presentation to define topics covered.  Reviews how body is impacted by stress, anxiety, and depression.  Also discusses healthy ways to reduce stress and to treat/manage anxiety and depression.  Written material provided at class time. Flowsheet Row Pulmonary Rehab from 01/29/2023 in [facility] Cardiac and Pulmonary Rehab  Date 01/22/23  Educator KW  Instruction Review Code 1- [employer] Understanding    Education: Sleep Hygiene -Provides group verbal and written instruction about how sleep can affect your health.  Define sleep hygiene, discuss sleep cycles and impact of sleep habits. Review good sleep hygiene tips.    Initial Review & Psychosocial Screening:   Quality of Life Scores:  Scores of 19 and below  usually indicate a poorer quality of life in these areas.  A difference of  2-3 points is a clinically meaningful difference.  A difference of 2-3 points in the total score of the Quality of Life Index has been associated with significant improvement in overall quality of life, self-image, physical symptoms, and general health in studies assessing change in quality of life.  PHQ-9: Review Flowsheet  More data exists      02/11/2024 08/20/2023 02/13/2023 01/28/2023 11/18/2022  Depression screen  PHQ 2/9  Decreased Interest 0 0 1 0 1  Down, Depressed, Hopeless 0 0 0 0 0  PHQ - 2 Score 0 0 1 0 1  Altered sleeping 0 1 0 0 0  Tired, decreased energy 0 1 3 3 3   Change in appetite 0 0 0 0 0  Feeling bad or failure about yourself  0 0 0 0 0  Trouble concentrating 0 0 0 0 0  Moving slowly or fidgety/restless 0 0 0 0 0  Suicidal thoughts 0 0 0 0 0  PHQ-9 Score 0 2 4 3 4   Difficult doing work/chores - - Somewhat difficult Not difficult at all -   Interpretation of Total Score  Total Score Depression Severity:  1-4 = Minimal depression, 5-9 = Mild depression, 10-14 = Moderate depression, 15-19 = Moderately severe depression, 20-27 = Severe depression   Psychosocial Evaluation and Intervention:   Psychosocial Re-Evaluation:  Psychosocial Re-Evaluation     Row Name 03/10/24 1536 04/01/24 1557 05/19/24 1546         Psychosocial Re-Evaluation   Current issues with Current Stress Concerns;Current Sleep Concerns Current Stress Concerns;Current Sleep Concerns Current Stress Concerns;Current Sleep Concerns     Comments Nancy [name] reports no major stressors at this time. Nancy [name] states that she is frustrated with her breathing as it has restricted her from doing the things she enjoys. She states that se struggles with staying asleep at this time. She has been taking tylenol  PM, but reports it only helps her sleep sometimes. Patient reports no issues with their current mental states, sleep, stress, depression or  anxiety. Will follow up with patient in a few weeks for any changes. Patient reports no concerns with sleep, stress,  or mental health. She wants to try to come more consistently to pulmonary rehab for exercise and to try to help improve SOB.     Expected Outcomes Short: Continue to attend rehab for exercise and mental boost. Long: Maintain positive outlook. Short: Continue to exercise regularly to support mental health and notify staff of any changes. Long: maintain mental health and well being through teaching of rehab or prescribed medications independently. Short: consistently attend pulmonary rehab for mental health benefits of exercise. Long: maintain good mental health routine.     Interventions Encouraged to attend Pulmonary Rehabilitation for the exercise Encouraged to attend Pulmonary Rehabilitation for the exercise Encouraged to attend Pulmonary Rehabilitation for the exercise     Continue Psychosocial Services  No Follow up required Follow up required by staff Follow up required by staff       Initial Review   Source of Stress Concerns Chronic Illness -- --     Comments Her shortness of breath is her main stressor -- --        Psychosocial Discharge (Final Psychosocial Re-Evaluation):  Psychosocial Re-Evaluation - 05/19/24 1546       Psychosocial Re-Evaluation   Current issues with Current Stress Concerns;Current Sleep Concerns    Comments Patient reports no concerns with sleep, stress,  or mental health. She wants to try to come more consistently to pulmonary rehab for exercise and to try to help improve SOB.    Expected Outcomes Short: consistently attend pulmonary rehab for mental health benefits of exercise. Long: maintain good mental health routine.    Interventions Encouraged to attend Pulmonary Rehabilitation for the exercise    Continue Psychosocial Services  Follow up required by staff          Education: Education Goals: Education  classes will be provided on a weekly  basis, covering required topics. Participant will state understanding/return demonstration of topics presented.  Learning Barriers/Preferences:   General Pulmonary Education Topics:  Infection Prevention: - Provides verbal and written material to individual with discussion of infection control including proper hand washing and proper equipment cleaning during exercise session. Flowsheet Row Pulmonary Rehab from 02/11/2024 in [facility] Cardiac and Pulmonary Rehab  Date 02/11/24  Educator [facility]  Instruction Review Code 1- Verbalizes Understanding    Falls Prevention: - Provides verbal and written material to individual with discussion of falls prevention and safety. Flowsheet Row Pulmonary Rehab from 02/11/2024 in [facility] Cardiac and Pulmonary Rehab  Date 02/11/24  Educator [facility]  Instruction Review Code 1- Verbalizes Understanding    Chronic Lung Disease Review: - Group verbal instruction with posters, models, PowerPoint presentations and videos,  to review new updates, new respiratory medications, new advancements in procedures and treatments. Providing information on websites and 800 numbers for continued self-education. Includes information about supplement oxygen , available portable oxygen  systems, continuous and intermittent flow rates, oxygen  safety, concentrators, and Medicare reimbursement for oxygen . Explanation of Pulmonary Drugs, including class, frequency, complications, importance of spacers, rinsing mouth after steroid MDI's, and proper cleaning methods for nebulizers. Review of basic lung anatomy and physiology related to function, structure, and complications of lung disease. Review of risk factors. Discussion about methods for diagnosing sleep apnea and types of masks and machines for OSA. Includes a review of the use of types of environmental controls: home humidity, furnaces, filters, dust mite/pet prevention, HEPA vacuums. Discussion about weather changes, air quality and the benefits of  nasal washing. Instruction on Warning signs, infection symptoms, calling MD promptly, preventive modes, and value of vaccinations. Review of effective airway clearance, coughing and/or vibration techniques. Emphasizing that all should Create an Action Plan. Written material provided at class time. Flowsheet Row Pulmonary Rehab from 01/29/2023 in [facility] Cardiac and Pulmonary Rehab  Education need identified 11/18/22  Date 01/15/23  Educator [facility]  Instruction Review Code 1- Verbalizes Understanding    AED/CPR: - Group verbal and written instruction with the use of models to demonstrate the basic use of the AED with the basic ABC's of resuscitation.    Tests and Procedures:  - Group verbal and visual presentation and models provide information about basic cardiac anatomy and function. Reviews the testing methods done to diagnose heart disease and the outcomes of the test results. Describes the treatment choices: Medical Management, Angioplasty, or Coronary Bypass Surgery for treating various heart conditions including Myocardial Infarction, Angina, Valve Disease, and Cardiac Arrhythmias.  Written material provided at class time. Flowsheet Row Pulmonary Rehab from 01/29/2023 in [facility] Cardiac and Pulmonary Rehab  Date 12/18/22  Educator SB  Instruction Review Code 1- Verbalizes Understanding    Medication Safety: - Group verbal and visual instruction to review commonly prescribed medications for heart and lung disease. Reviews the medication, class of the drug, and side effects. Includes the steps to properly store meds and maintain the prescription regimen.  Written material given at graduation.   Other: -Provides group and verbal instruction on various topics (see comments) Flowsheet Row Pulmonary Rehab from 01/29/2023 in [facility] Cardiac and Pulmonary Rehab  Date 12/25/22  Educator SB  Instruction Review Code 1- Verbalizes Understanding  [Jeopardy]    Knowledge Questionnaire Score:    Core  Components/Risk Factors/Patient Goals at Admission:   Education:Diabetes - Individual verbal and written instruction to review signs/symptoms of diabetes, desired ranges of glucose level fasting, after meals  and with exercise. Acknowledge that pre and post exercise glucose checks will be done for 3 sessions at entry of program.   Know Your Numbers and Heart Failure: - Group verbal and visual instruction to discuss disease risk factors for cardiac and pulmonary disease and treatment options.  Reviews associated critical values for Overweight/Obesity, Hypertension, Cholesterol, and Diabetes.  Discusses basics of heart failure: signs/symptoms and treatments.  Introduces Heart Failure Zone chart for action plan for heart failure. Written material provided at class time.   Core Components/Risk Factors/Patient Goals Review:   Goals and Risk Factor Review     Row Name 03/10/24 1541 04/01/24 1556 05/19/24 1539         Core Components/Risk Factors/Patient Goals Review   Personal Goals Review Weight Management/Obesity;Improve shortness of breath with ADL's Improve shortness of breath with ADL's Hypertension     Review Nancy [name] states that she would still like to lose some weight. She would like to get down to 150 lbs but is restricted by her breathing and is not able to exercise as much as she feels she needs to. She is continuing to practice PLB but states she still feels SOB. She reports still feeling SOB even when wearing her oxygen , although her O2 saturations have stayed above 90%. Spoke to patient about their shortness of breath and what they can do to improve. Patient has been informed of breathing techniques when starting the program. Patient is informed to tell staff if they have had any med changes and that certain meds they are taking or not taking can be causing shortness of breath. Nancy [name] states that she takes all her blood pressur medication and follows up with her doctor to manage this. She does  report that she takes her BP at  home and it is usually good.     Expected Outcomes Short: Continue to practice breathing techniques to improve SOB. Long: Continue to manage lifestyle risk factors. Short: Attend [facility] regularly to improve shortness of breath with ADL's. Long: maintain independence with ADL's Short: continue to check BP at home. Long: work with medical team to continue to control blood pressure.        Core Components/Risk Factors/Patient Goals at Discharge (Final Review):   Goals and Risk Factor Review - 05/19/24 1539       Core Components/Risk Factors/Patient Goals Review   Personal Goals Review Hypertension    Review Solveig states that she takes all her blood pressur medication and follows up with her doctor to manage this. She does report that she takes her BP at  home and it is usually good.    Expected Outcomes Short: continue to check BP at home. Long: work with medical team to continue to control blood pressure.          ITP Comments:  ITP Comments     Row Name 03/17/24 1301 04/14/24 1051 05/12/24 1057 06/09/24 1006 07/07/24 1027   ITP Comments 30 Day review completed. Medical Director ITP review done, changes made as directed, and signed approval by Medical Director. New to program. 30 Day review completed. Medical Director ITP review done, changes made as directed, and signed approval by Medical Director. 30 Day review completed. Medical Director ITP review done, changes made as directed, and signed approval by Medical Director. 30 Day review completed. Medical Director ITP review done, changes made as directed, and signed approval by Medical Director. 30 Day review completed. Medical Director ITP review done, changes made as directed, and signed approval by  Medical Director.    Row Name 08/04/24 1114 09/01/24 1052         ITP Comments 30 Day review completed. Medical Director ITP review done, changes made as directed, and signed approval by Medical Director.  Medical hold.  30 Day review completed. Medical Director ITP review done, changes made as directed, and signed approval by Medical Director.         Comments: 30 day review

## 2024-09-09 DIAGNOSIS — J449 Chronic obstructive pulmonary disease, unspecified: Secondary | ICD-10-CM

## 2024-09-09 NOTE — Progress Notes (Signed)
 Pulmonary Individual Treatment Plan  Patient Details  Name: Nancy [name] MRN: [id number] Date of Birth: 05-May-1961 Referring Provider:   Flowsheet Row Pulmonary Rehab from 02/11/2024 in [facility] Cardiac and Pulmonary Rehab  Referring Provider Dr. Lavelle Servant    Initial Encounter Date:  Flowsheet Row Pulmonary Rehab from 02/11/2024 in [facility] Cardiac and Pulmonary Rehab  Date 02/11/24    Visit Diagnosis: Stage 3 severe COPD by GOLD classification (HCC)  Patient's Home Medications on Admission:  Current Outpatient Medications:    acetaminophen  (TYLENOL ) 500 MG tablet, Take 500 mg by mouth every 4 (four) hours as needed., Disp: , Rfl:    albuterol  (VENTOLIN  HFA) 108 (90 Base) MCG/ACT inhaler, SMARTSIG:2 Inhalation Via Inhaler Every 6 Hours PRN, Disp: , Rfl:    amLODipine  (NORVASC ) 5 MG tablet, Take 5 mg by mouth daily., Disp: , Rfl:    benzonatate  (TESSALON ) 200 MG capsule, Take 1 capsule (200 mg total) by mouth 2 (two) times daily as needed for cough., Disp: 20 capsule, Rfl: 0   diclofenac  Sodium (VOLTAREN ) 1 % GEL, Apply 2 g topically 4 (four) times daily., Disp: 100 g, Rfl: 0   doxycycline  (PERIOSTAT ) 20 MG tablet, Take one tab po BID with food., Disp: 60 tablet, Rfl: 11   DULoxetine  (CYMBALTA ) 20 MG capsule, TAKE 2 CAPSULES BY MOUTH TWICE DAILY, Disp: 360 capsule, Rfl: 0   enoxaparin  (LOVENOX ) 100 MG/ML injection, Inject into the skin. (Patient not taking: Reported on 12/24/2023), Disp: , Rfl:    enoxaparin  (LOVENOX ) 40 MG/0.4ML injection, Inject 90 mg into the skin daily. (Patient not taking: Reported on 12/24/2023), Disp: , Rfl:    Fluticasone -Umeclidin-Vilant (TRELEGY ELLIPTA ) 100-62.5-25 MCG/ACT AEPB, Inhale into the lungs., Disp: , Rfl:    furosemide  (LASIX ) 40 MG tablet, Take 40 mg by mouth 2 (two) times daily. (Patient not taking: Reported on 12/24/2023), Disp: , Rfl:    furosemide  (LASIX ) 40 MG tablet, Take 1 tablet by mouth 2 (two) times daily., Disp: , Rfl:    ipratropium-albuterol   (DUONEB) 0.5-2.5 (3) MG/3ML SOLN, Inhale into the lungs., Disp: , Rfl:    levofloxacin  (LEVAQUIN ) 750 MG tablet, Take 1 tablet (750 mg total) by mouth daily., Disp: 7 tablet, Rfl: 0   levothyroxine  (SYNTHROID ) 125 MCG tablet, Take 1 tablet (125 mcg total) by mouth daily before breakfast., Disp: 90 tablet, Rfl: 3   lidocaine  (LIDODERM ) 5 %, Place 1 patch onto the skin every 12 (twelve) hours. Remove & Discard patch within 12 hours or as directed by MD, Disp: 15 patch, Rfl: 0   metoprolol  succinate (TOPROL -XL) 25 MG 24 hr tablet, Take 25 mg by mouth daily., Disp: , Rfl:    Na Sulfate-K Sulfate-Mg Sulfate concentrate 17.5-3.13-1.6 GM/177ML SOLN, , Disp: , Rfl:    ondansetron  (ZOFRAN  ODT) 4 MG disintegrating tablet, Take 1 tablet (4 mg total) by mouth every 8 (eight) hours as needed for nausea or vomiting., Disp: 20 tablet, Rfl: 2   oxyCODONE  (OXY IR/ROXICODONE ) 5 MG immediate release tablet, Take 1 tablet (5 mg total) by mouth every 6 (six) hours as needed for moderate pain (pain score 4-6)., Disp: 8 tablet, Rfl: 0   polyethylene glycol (MIRALAX  / GLYCOLAX ) 17 g packet, Take 17 g by mouth daily as needed., Disp: , Rfl:    senna-docusate (SENOKOT-S) 8.6-50 MG tablet, Take 1 tablet by mouth 2 (two) times daily as needed for mild constipation., Disp: 20 tablet, Rfl: 0   TRELEGY ELLIPTA  100-62.5-25 MCG/ACT AEPB, Inhale into the lungs., Disp: , Rfl:  valACYclovir  (VALTREX ) 1000 MG tablet, TAKE 1 TABLET BY MOUTH DAILY AS DIRECTED., Disp: 6 tablet, Rfl: 0   warfarin (COUMADIN ) 5 MG tablet, Take 5 mg by mouth every evening. Per cardiologist: patient supposed to take 7.5 mg 9/24 and 9/25 evening for low INR then resume 5 mg nightly  Goal INR 2.5-3.5, Disp: , Rfl:   Current Facility-Administered Medications:    ipratropium-albuterol  (DUONEB) 0.5-2.5 (3) MG/3ML nebulizer solution 3 mL, 3 mL, Nebulization, Once, Pollak, Adriana M, PA-C  Past Medical History: Past Medical History:  Diagnosis Date   Aortic  dissection (HCC) 2010   COPD (chronic obstructive pulmonary disease) (HCC)    Depression    Hypertension    Hypothyroidism    Pneumonia    PONV (postoperative nausea and vomiting)    Rosacea    Seasonal allergies    Sleep apnea    Does not use CPAP    Tobacco Use: Social History   Tobacco Use  Smoking Status Former   Current packs/day: 0.00   Average packs/day: 0.5 packs/day for 30.0 years (15.0 ttl pk-yrs)   Types: Cigarettes   Start date: 03/11/1986   Quit date: 03/11/2016   Years since quitting: 8.5  Smokeless Tobacco Never  Tobacco Comments   patient states  that she is working at her own pace to quit smoking    Labs: Review Flowsheet  More data may exist      Latest Ref Rng & Units 07/02/2009 08/29/2015 01/05/2018 09/29/2020 08/20/2023  Labs for ITP Cardiac and Pulmonary Rehab  Cholestrol 100 - 199 mg/dL - 823  814  832  837   LDL (calc) 0 - 99 mg/dL - 896  888  898  894   HDL-C >39 mg/dL - 53  50  45  38   Trlycerides 0 - 149 mg/dL - 897  880  883  894   Hemoglobin A1c 4.8 - 5.6 % - - - - 4.8   TCO2 0 - 100 mmol/L 22  - - - -     Pulmonary Assessment Scores:   UCSD: Self-administered rating of dyspnea associated with activities of daily living (ADLs) 6-point scale (0 = not at all to 5 = maximal or unable to do because of breathlessness)  Scoring Scores range from 0 to 120.  Minimally important difference is 5 units  CAT: CAT can identify the health impairment of COPD patients and is better correlated with disease progression.  CAT has a scoring range of zero to 40. The CAT score is classified into four groups of low (less than 10), medium (10 - 20), high (21-30) and very high (31-40) based on the impact level of disease on health status. A CAT score over 10 suggests significant symptoms.  A worsening CAT score could be explained by an exacerbation, poor medication adherence, poor inhaler technique, or progression of COPD or comorbid conditions.  CAT MCID is 2  points  mMRC: mMRC (Modified Medical Research Council) Dyspnea Scale is used to assess the degree of baseline functional disability in patients of respiratory disease due to dyspnea. No minimal important difference is established. A decrease in score of 1 point or greater is considered a positive change.   Pulmonary Function Assessment:   Exercise Target Goals: Exercise Program Goal: Individual exercise prescription set using results from initial 6 min walk test and THRR while considering  patient's activity barriers and safety.   Exercise Prescription Goal: Initial exercise prescription builds to 30-45 minutes a day of aerobic activity,  2-3 days per week.  Home exercise guidelines will be given to patient during program as part of exercise prescription that the participant will acknowledge.  Education: Aerobic Exercise: - Group verbal and visual presentation on the components of exercise prescription. Introduces F.I.T.T principle from ACSM for exercise prescriptions.  Reviews F.I.T.T. principles of aerobic exercise including progression. Written material provided at class time. Flowsheet Row Pulmonary Rehab from 01/29/2023 in [facility] Cardiac and Pulmonary Rehab  Date 11/27/22  Educator KW  Instruction Review Code 1- [employer] Understanding    Education: Resistance Exercise: - Group verbal and visual presentation on the components of exercise prescription. Introduces F.I.T.T principle from ACSM for exercise prescriptions  Reviews F.I.T.T. principles of resistance exercise including progression. Written material provided at class time. Flowsheet Row Pulmonary Rehab from 01/29/2023 in [facility] Cardiac and Pulmonary Rehab  Date 12/04/22  Educator KW  Instruction Review Code 1- [employer] Understanding     Education: Exercise & Equipment Safety: - Individual verbal instruction and demonstration of equipment use and safety with use of the equipment. Flowsheet Row Pulmonary Rehab from 02/11/2024 in  [facility] Cardiac and Pulmonary Rehab  Date 02/11/24  Educator [facility]  Instruction Review Code 1- Verbalizes Understanding    Education: Exercise Physiology & General Exercise Guidelines: - Group verbal and written instruction with models to review the exercise physiology of the cardiovascular system and associated critical values. Provides general exercise guidelines with specific guidelines to those with heart or lung disease.    Education: Flexibility, Balance, Mind/Body Relaxation: - Group verbal and visual presentation with interactive activity on the components of exercise prescription. Introduces F.I.T.T principle from ACSM for exercise prescriptions. Reviews F.I.T.T. principles of flexibility and balance exercise training including progression. Also discusses the mind body connection.  Reviews various relaxation techniques to help reduce and manage stress (i.e. Deep breathing, progressive muscle relaxation, and visualization). Balance handout provided to take home. Written material provided at class time. Flowsheet Row Pulmonary Rehab from 01/29/2023 in [facility] Cardiac and Pulmonary Rehab  Date 12/11/22  Educator KW  Instruction Review Code 1- Verbalizes Understanding    Activity Barriers & Risk Stratification:   6 Minute Walk:  Oxygen  Initial Assessment:   Oxygen  Re-Evaluation:  Oxygen  Re-Evaluation     Row Name 04/01/24 1558 05/05/24 1535 05/19/24 1548         Program Oxygen  Prescription   Program Oxygen  Prescription Continuous Continuous Continuous     Liters per minute 2 2 2        Home Oxygen    Home Oxygen  Device Portable Concentrator;Home Concentrator Portable Concentrator;Home Concentrator Portable Concentrator;Home Concentrator     Sleep Oxygen  Prescription Continuous;CPAP  Nancy [name] states she cannot tolerate it. Continuous;CPAP  does not tolerate Continuous;CPAP     Liters per minute 2 2 2      Home Exercise Oxygen  Prescription Continuous Continuous Continuous     Liters per  minute 2 2 2      Home Resting Oxygen  Prescription None None None     Compliance with Home Oxygen  Use No No No     Comments -- --  does not wear CPAP --       Goals/Expected Outcomes   Short Term Goals Other;To learn and understand importance of maintaining oxygen  saturations>88% Other To learn and understand importance of monitoring SPO2 with pulse oximeter and demonstrate accurate use of the pulse oximeter.;To learn and exhibit compliance with exercise, home and travel O2 prescription     Long  Term Goals Other;Maintenance of O2 saturations>88% Other Exhibits compliance with  exercise, home  and travel O2 prescription;Verbalizes importance of monitoring SPO2 with pulse oximeter and return demonstration     Comments Nancy [name] states that her concentrator does not do anything for her shortness of breath so she does not use it when she is out and about. Informed her the differences in shortness of breath and oxygenation. Patient verbalizes understanding. She states she does not tolerate wearing any of her CPAP mask. Valen states that she is not wearing her CPAP due to it falling off her face. She states it does not stay on so she does not wear it. Informed patient why it is important to wear it. She states aving about  five different mask and nothing is working for her. Taheera report that she has been using her oxygen  at night again. She does not use oxygen  during the day but just stops and rests when she gets SOB. PLB was reviewed and patient states that she does use PLB at home to help control SOB. She does have a pulse ox at home and and does occationally monitor SaO2. She states her oxygen  levels are good at rest but do drop sometimes with active.     Goals/Expected Outcomes Short: maintain spo2 greater than 88 percent on exertion. Long: maintain oxygen  saturation independently. Short: give her CPAP another try. Long: adhere to using a CPAP. Short: continue to use oygen at night. Long: adhere to using CPAP long  term.        Oxygen  Discharge (Final Oxygen  Re-Evaluation):  Oxygen  Re-Evaluation - 05/19/24 1548       Program Oxygen  Prescription   Program Oxygen  Prescription Continuous    Liters per minute 2      Home Oxygen    Home Oxygen  Device Portable Concentrator;Home Concentrator    Sleep Oxygen  Prescription Continuous;CPAP    Liters per minute 2    Home Exercise Oxygen  Prescription Continuous    Liters per minute 2    Home Resting Oxygen  Prescription None    Compliance with Home Oxygen  Use No      Goals/Expected Outcomes   Short Term Goals To learn and understand importance of monitoring SPO2 with pulse oximeter and demonstrate accurate use of the pulse oximeter.;To learn and exhibit compliance with exercise, home and travel O2 prescription    Long  Term Goals Exhibits compliance with exercise, home  and travel O2 prescription;Verbalizes importance of monitoring SPO2 with pulse oximeter and return demonstration    Comments Nancy [name] report that she has been using her oxygen  at night again. She does not use oxygen  during the day but just stops and rests when she gets SOB. PLB was reviewed and patient states that she does use PLB at home to help control SOB. She does have a pulse ox at home and and does occationally monitor SaO2. She states her oxygen  levels are good at rest but do drop sometimes with active.    Goals/Expected Outcomes Short: continue to use oygen at night. Long: adhere to using CPAP long term.          Initial Exercise Prescription:   Perform Capillary Blood Glucose checks as needed.  Exercise Prescription Changes:   Exercise Prescription Changes     Row Name 03/16/24 1400 04/01/24 1700 04/07/24 1500 04/14/24 1500 04/28/24 1400     Response to Exercise   Blood Pressure (Admit) 138/66 120/70 -- 138/66 112/68   Blood Pressure (Exercise) -- 146/72 -- 124/80 --   Blood Pressure (Exit) 118/60 118/88 -- 124/68 102/60  Heart Rate (Admit) 106 bpm 109 bpm -- 103 bpm 95 bpm    Heart Rate (Exercise) 117 bpm 120 bpm -- 114 bpm 110 bpm   Heart Rate (Exit) 101 bpm 108 bpm -- 105 bpm 105 bpm   Oxygen  Saturation (Admit) 98 % 97 % -- 97 % 96 %   Oxygen  Saturation (Exercise) 94 % 95 % -- 94 % 94 %   Oxygen  Saturation (Exit) 97 % 96 % -- 97 % 98 %   Rating of Perceived Exertion (Exercise) 13 13 -- 13 13   Perceived Dyspnea (Exercise) 3 3 -- 4 3   Symptoms none none -- none none   Duration Continue with 30 min of aerobic exercise without signs/symptoms of physical distress. Continue with 30 min of aerobic exercise without signs/symptoms of physical distress. Continue with 30 min of aerobic exercise without signs/symptoms of physical distress. Continue with 30 min of aerobic exercise without signs/symptoms of physical distress. Continue with 30 min of aerobic exercise without signs/symptoms of physical distress.   Intensity THRR unchanged THRR unchanged THRR unchanged THRR unchanged THRR unchanged     Progression   Progression Continue to progress workloads to maintain intensity without signs/symptoms of physical distress. Continue to progress workloads to maintain intensity without signs/symptoms of physical distress. Continue to progress workloads to maintain intensity without signs/symptoms of physical distress. Continue to progress workloads to maintain intensity without signs/symptoms of physical distress. Continue to progress workloads to maintain intensity without signs/symptoms of physical distress.   Average METs 2.33 2.02 2.02 2.3 2.22     Resistance Training   Training Prescription Yes Yes Yes Yes Yes   Weight 3 3 3 3 3    Reps 10-15 10-15 10-15 10-15 10-15     Interval Training   Interval Training No No No No No     Oxygen    Oxygen  Continuous Continuous Continuous Continuous Continuous   Liters 2 2 2 2 2      Treadmill   MPH 1.5 1.3 1.3 1.4 1.5   Grade 0 0 0 0 0   Minutes 15 15 15 15 15    METs 2.15 1.99 1.99 2.07 2.15     NuStep   Level -- 4 4 -- 3    Minutes -- 15 15 -- 15   METs -- 2.1 2.1 -- 2.3     REL-XR   Level 4 -- -- 4 --   Minutes 15 -- -- 15 --   METs 3 -- -- 3.3 --     T5 Nustep   Level -- 3  T6 3  T6 -- --   SPM -- 80 80 -- --   Minutes -- 15 15 -- --   METs -- 2 2 -- --     Biostep-RELP   Level 1 -- -- 1 --   Minutes 15 -- -- 15 --   METs 2 -- -- 2 --     Home Exercise Plan   Plans to continue exercise at -- -- Home (comment)  weights and walking Home (comment)  weights and walking Home (comment)  weights and walking   Frequency -- -- Add 2 additional days to program exercise sessions. Add 2 additional days to program exercise sessions. Add 2 additional days to program exercise sessions.   Initial Home Exercises Provided -- -- 04/07/24 04/07/24 04/07/24     Oxygen    Maintain Oxygen  Saturation 88% or higher 88% or higher 88% or higher 88% or higher 88% or higher  Row Name 05/11/24 1300 05/25/24 1100 06/10/24 1300 06/24/24 1300       Response to Exercise   Blood Pressure (Admit) 132/76 122/80 132/78 144/82    Blood Pressure (Exercise) -- -- -- 158/70    Blood Pressure (Exit) 106/64 120/60 102/52 100/60    Heart Rate (Admit) 91 bpm 91 bpm 92 bpm 100 bpm    Heart Rate (Exercise) 101 bpm 100 bpm 104 bpm 104 bpm    Heart Rate (Exit) 98 bpm 94 bpm 95 bpm 100 bpm    Oxygen  Saturation (Admit) 95 % 97 % 91 % 98 %    Oxygen  Saturation (Exercise) 94 % 95 % 91 % 97 %    Oxygen  Saturation (Exit) 96 % 97 % 97 % 96 %    Rating of Perceived Exertion (Exercise) 12 12 12 12     Perceived Dyspnea (Exercise) 3.5 3 3 4     Symptoms none none none none    Duration Continue with 30 min of aerobic exercise without signs/symptoms of physical distress. Continue with 30 min of aerobic exercise without signs/symptoms of physical distress. Continue with 30 min of aerobic exercise without signs/symptoms of physical distress. Continue with 30 min of aerobic exercise without signs/symptoms of physical distress.    Intensity THRR unchanged  THRR unchanged THRR unchanged THRR unchanged      Progression   Progression Continue to progress workloads to maintain intensity without signs/symptoms of physical distress. Continue to progress workloads to maintain intensity without signs/symptoms of physical distress. Continue to progress workloads to maintain intensity without signs/symptoms of physical distress. Continue to progress workloads to maintain intensity without signs/symptoms of physical distress.    Average METs 2.29 2.5 2 1.89      Resistance Training   Training Prescription Yes Yes Yes Yes    Weight 3 lb 3 lb 3 lb 3 lb    Reps 10-15 10-15 10-15 10-15      Interval Training   Interval Training No No No No      Oxygen    Oxygen  Continuous Continuous Continuous Continuous    Liters 2 2 2 2       Treadmill   MPH 1.4 1.5 1.5 1    Grade 0 0 0 0    Minutes 15 15 15 15     METs 2.07 2.15 2.15 1.77      NuStep   Level -- -- 1  T6 --    Minutes -- -- 15 --    METs -- -- 1.9 --      REL-XR   Level 4 4 4  --    Minutes 15 15 15  --    METs 3.5 3.7 -- --      T5 Nustep   Level 3  T6 nustep -- -- --    Minutes 15 -- -- --    METs 2 -- -- --      Biostep-RELP   Level 2 -- -- 1    Minutes 15 -- -- 15    METs 2 -- -- 2      Home Exercise Plan   Plans to continue exercise at Home (comment)  weights and walking Home (comment)  weights and walking Home (comment)  weights and walking Home (comment)  weights and walking    Frequency Add 2 additional days to program exercise sessions. Add 2 additional days to program exercise sessions. Add 2 additional days to program exercise sessions. Add 2 additional days to program exercise sessions.  Initial Home Exercises Provided 04/07/24 04/07/24 04/07/24 04/07/24      Oxygen    Maintain Oxygen  Saturation 88% or higher 88% or higher 88% or higher 88% or higher       Exercise Comments:   Exercise Goals and Review:   Exercise Goals Re-Evaluation :  Exercise Goals  Re-Evaluation     Row Name 03/16/24 1452 04/01/24 1751 04/07/24 1550 04/14/24 1549 04/28/24 1454     Exercise Goal Re-Evaluation   Exercise Goals Review Increase Physical Activity;Increase Strength and Stamina;Understanding of Exercise Prescription Increase Physical Activity;Increase Strength and Stamina;Understanding of Exercise Prescription Increase Physical Activity;Able to understand and use rate of perceived exertion (RPE) scale;Knowledge and understanding of Target Heart Rate Range (THRR);Understanding of Exercise Prescription;Increase Strength and Stamina;Able to understand and use Dyspnea scale;Able to check pulse independently Increase Physical Activity;Increase Strength and Stamina;Understanding of Exercise Prescription Increase Physical Activity;Increase Strength and Stamina;Understanding of Exercise Prescription   Comments Nancy [name] is doing well in the program and only attended 2 sessions in this review. She maintained level 4 on the XR and a speed of 1.5 mph on the treadmiil with no incline. She also added the biostep at level 1 to her exercise prescription. We will continue to monitor her progress in the program. Nancy [name] is doing well in the program and only attended 2 sessions in this review. She increased to level 4 on the T4 nustep. She also used the T6 nustep at level 3 and the treadmill at a speed of 1.3 mph with no incline. We will continue to monitor her progress in the program. Reviewed home exercise with pt today.  Pt plans to use hand weights and walk for exercise.  Reviewed THR, pulse, RPE, sign and symptoms, pulse oximetery and when to call 911 or MD.  Also discussed weather considerations and indoor options.  Pt voiced understanding. Nancy [name] is doing well in rehab. She was able to increase her treadmill speed from 1.3mph to 1.4mph. She was also able to maintain level 4 on the XR. We will continue to monitor her progress in the program. Nancy [name] continues to do well in rehab. She was only able to  attend one session during this review period. During the one session she was able to increase her speed on the treadmill from 1.4 to 1.5 mph. We will continue to monitor her progress in the program.   Expected Outcomes Short: Attend rehab more consistently. Long: Continue exercise to improve strength and stamina. Short: Attend rehab more consistently. Long: Continue exercise to improve strength and stamina. Short: add 1-2 days a week of exercise at home on off days of rehab. Long: Maintain independent exercise routine. Short: Continue to increase treadmill workloads. Long: Continue exercise to improve strength and stamina. Short: Continue to increase treadmill workloads. Long: Continue exercise to improve strength and stamina.    Row Name 05/11/24 1346 05/19/24 1542 05/25/24 1135 06/10/24 1354 06/24/24 1358     Exercise Goal Re-Evaluation   Exercise Goals Review Increase Physical Activity;Increase Strength and Stamina;Understanding of Exercise Prescription Increase Physical Activity;Increase Strength and Stamina;Understanding of Exercise Prescription Increase Physical Activity;Increase Strength and Stamina;Understanding of Exercise Prescription Increase Physical Activity;Increase Strength and Stamina;Understanding of Exercise Prescription Increase Physical Activity;Increase Strength and Stamina;Understanding of Exercise Prescription   Comments Nancy [name] continues to do well in rehab. She continues to walk on the treadmill at a speed of 1.4 mph with no incline. She also improved to level 2 on the biostep and continues to work at level 3 on the T6  nustep and level 4 on the XR. We will continue to monitor her progress in the program. Nancy [name] reports that she does not do much structured exercise at home but tries to remain as active as she can with her house work. She was encouraged to try to attend pulmonary rehab consistently and to try to incorporate more more home activites as she feels she can tolerate it. She  reports that her SOB is her main limiting factor. Nancy [name] is doing well in rehab. She has been able to increase her speed on the treadmill from 1.4 to 1. with no incline. She was also able to maintain a level 4 on the XR. We will continue to monitor her progress in the program. Nancy [name] is doing well in rehab. She has been able to maintain her workload on the treadmill at 1. and 0% incline. She was also able to maintain level 4 on the Xr. We will continue to monitor her progress in the program. Nancy [name] has only attended one session in rehab since the last review. She had a high blood pressure which caused her to have decreased workloads during her one session. We will encourage her to attend rehab more consistently and continue to monitor her progress in the program.   Expected Outcomes Short: Continue to progressively increase treadmill workload. Long: Continue exercise to improve strength and stamina. Short: try to attend rehab consistently and add more activiity at home. Long: maintain an independent exercise routine upon graduation from rehab. Short: Continue to increase treadmill workload. Long: Continue exercise to improve strength and stamina. Short: Work to increase treadmill workloads. Long: Continue exercise to improve strength and stamina. --    Row Name 07/08/24 0809 07/22/24 0755 08/03/24 1511 08/18/24 1146       Exercise Goal Re-Evaluation   Exercise Goals Review Increase Physical Activity;Increase Strength and Stamina;Understanding of Exercise Prescription Increase Physical Activity;Increase Strength and Stamina;Understanding of Exercise Prescription Increase Physical Activity;Increase Strength and Stamina;Understanding of Exercise Prescription Increase Physical Activity;Increase Strength and Stamina;Understanding of Exercise Prescription    Comments Nancy [name] has not attended rehab since the last review. She last attended rehab on 07/30. We will contact her to see when she plans to return. We will  continue to monitor her progress when she returns to the program. Nancy [name] has not attended rehab since 07/30. We recently called her and she requested to be placed on medical hold at this time as she is having surgery on 09/18. We will continue to monitor her progress when she returns to the program. Nancy [name] has not attended rehab since 07/30. She has been on medical hold since having surgery on 09/18. We will continue to monitor her progress when she returns to the program. Nancy [name] continues to be on medical hold at this time. She has not attended rehab since 07/30. She recently called and asked to wait a few more weeks before returning to rehab until after she talks to her doctor. We will continue to monitor her progress when she returns to the program.    Expected Outcomes Short: Return to rehab when appropriate. Long: Graduate. Short: Return to rehab when appropriate. Long: Graduate. Short: Return to rehab when appropriate. Long: Graduate. Short: Return to rehab when appropriate. Long: Graduate.       Discharge Exercise Prescription (Final Exercise Prescription Changes):  Exercise Prescription Changes - 06/24/24 1300       Response to Exercise   Blood Pressure (Admit) 144/82    Blood Pressure (Exercise) 158/70  Blood Pressure (Exit) 100/60    Heart Rate (Admit) 100 bpm    Heart Rate (Exercise) 104 bpm    Heart Rate (Exit) 100 bpm    Oxygen  Saturation (Admit) 98 %    Oxygen  Saturation (Exercise) 97 %    Oxygen  Saturation (Exit) 96 %    Rating of Perceived Exertion (Exercise) 12    Perceived Dyspnea (Exercise) 4    Symptoms none    Duration Continue with 30 min of aerobic exercise without signs/symptoms of physical distress.    Intensity THRR unchanged      Progression   Progression Continue to progress workloads to maintain intensity without signs/symptoms of physical distress.    Average METs 1.89      Resistance Training   Training Prescription Yes    Weight 3 lb    Reps 10-15       Interval Training   Interval Training No      Oxygen    Oxygen  Continuous    Liters 2      Treadmill   MPH 1    Grade 0    Minutes 15    METs 1.77      Biostep-RELP   Level 1    Minutes 15    METs 2      Home Exercise Plan   Plans to continue exercise at Home (comment)   weights and walking   Frequency Add 2 additional days to program exercise sessions.    Initial Home Exercises Provided 04/07/24      Oxygen    Maintain Oxygen  Saturation 88% or higher          Nutrition:  Target Goals: Understanding of nutrition guidelines, daily intake of sodium 1500mg , cholesterol 200mg , calories 30% from fat and 7% or less from saturated fats, daily to have 5 or more servings of fruits and vegetables.  Education: Nutrition 1 -Group instruction provided by verbal, written material, interactive activities, discussions, models, and posters to present general guidelines for heart healthy nutrition including macronutrients, label reading, and promoting whole foods over processed counterparts. Education serves as pensions consultant of discussion of heart healthy eating for all. Written material provided at class time.     Education: Nutrition 2 -Group instruction provided by verbal, written material, interactive activities, discussions, models, and posters to present general guidelines for heart healthy nutrition including sodium, cholesterol, and saturated fat. Providing guidance of habit forming to improve blood pressure, cholesterol, and body weight. Written material provided at class time.     Biometrics:    Nutrition Therapy Plan and Nutrition Goals:   Nutrition Assessments:  MEDIFICTS Score Key: >=70 Need to make dietary changes  40-70 Heart Healthy Diet <= 40 Therapeutic Level Cholesterol Diet  Flowsheet Row Pulmonary Rehab from 02/13/2023 in [facility] Cardiac and Pulmonary Rehab  Picture Your Plate Total Score on Discharge 56   Picture Your Plate Scores: <59 Unhealthy dietary  pattern with much room for improvement. 41-50 Dietary pattern unlikely to meet recommendations for good health and room for improvement. 51-60 More healthful dietary pattern, with some room for improvement.  >60 Healthy dietary pattern, although there may be some specific behaviors that could be improved.   Nutrition Goals Re-Evaluation:  Nutrition Goals Re-Evaluation     Row Name 04/01/24 1556 05/19/24 1537           Goals   Comment Patient was informed on why it is important to maintain a balanced diet when dealing with Respiratory issues. Explained that it takes a lot  of energy to breath and when they are short of breath often they will need to have a good diet to help keep up with the calories they are expending for breathing. 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 Long: Maintain a diet independently that meets their caloric intake to aid in daily shortness of breath. 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 Participant is able to verbalize types and ability to use techniques and skills needed for reducing stress and depression.   Education: Stress, Anxiety, and Depression - Group verbal and visual presentation to define topics covered.  Reviews how body is impacted by stress, anxiety, and depression.  Also discusses healthy ways to reduce stress and to treat/manage anxiety and depression.  Written material provided at class time. Flowsheet Row  Pulmonary Rehab from 01/29/2023 in [facility] Cardiac and Pulmonary Rehab  Date 01/22/23  Educator KW  Instruction Review Code 1- [employer] Understanding    Education: Sleep Hygiene -Provides group verbal and written instruction about how sleep can affect your health.  Define sleep hygiene, discuss sleep cycles and impact of sleep habits. Review good sleep hygiene tips.    Initial Review & Psychosocial Screening:   Quality of Life Scores:  Scores of 19 and below usually indicate a poorer quality of life in these areas.  A difference of  2-3 points is a clinically meaningful difference.  A difference of 2-3 points in the total score of the Quality of Life Index has been associated with significant improvement in overall quality of life, self-image, physical symptoms, and general health in studies assessing change in quality of life.  PHQ-9: Review Flowsheet  More data exists      02/11/2024 08/20/2023 02/13/2023 01/28/2023 11/18/2022  Depression screen PHQ 2/9  Decreased Interest 0 0 1 0 1  Down, Depressed, Hopeless 0 0 0 0 0  PHQ - 2 Score 0 0 1 0 1  Altered sleeping 0 1 0 0 0  Tired, decreased energy 0 1 3 3 3   Change in appetite 0 0 0 0 0  Feeling bad or failure about yourself  0 0 0 0 0  Trouble concentrating 0 0 0 0 0  Moving slowly or fidgety/restless 0 0 0 0 0  Suicidal thoughts 0 0 0 0 0  PHQ-9 Score 0 2 4 3 4   Difficult doing work/chores - - Somewhat difficult Not difficult at all -   Interpretation of Total Score  Total Score Depression Severity:  1-4 = Minimal depression, 5-9 = Mild depression, 10-14 = Moderate depression, 15-19 = Moderately severe depression, 20-27 = Severe depression   Psychosocial Evaluation and Intervention:   Psychosocial Re-Evaluation:  Psychosocial Re-Evaluation     Row Name 04/01/24 1557 05/19/24 1546           Psychosocial Re-Evaluation   Current issues with Current Stress Concerns;Current Sleep Concerns Current Stress Concerns;Current Sleep  Concerns  Comments Patient reports no issues with their current mental states, sleep, stress, depression or anxiety. Will follow up with patient in a few weeks for any changes. Patient reports no concerns with sleep, stress,  or mental health. She wants to try to come more consistently to pulmonary rehab for exercise and to try to help improve SOB.      Expected Outcomes Short: Continue to exercise regularly to support mental health and notify staff of any changes. Long: maintain mental health and well being through teaching of rehab or prescribed medications independently. Short: consistently attend pulmonary rehab for mental health benefits of exercise. Long: maintain good mental health routine.      Interventions Encouraged to attend Pulmonary Rehabilitation for the exercise Encouraged to attend Pulmonary Rehabilitation for the exercise      Continue Psychosocial Services  Follow up required by staff Follow up required by staff         Psychosocial Discharge (Final Psychosocial Re-Evaluation):  Psychosocial Re-Evaluation - 05/19/24 1546       Psychosocial Re-Evaluation   Current issues with Current Stress Concerns;Current Sleep Concerns    Comments Patient reports no concerns with sleep, stress,  or mental health. She wants to try to come more consistently to pulmonary rehab for exercise and to try to help improve SOB.    Expected Outcomes Short: consistently attend pulmonary rehab for mental health benefits of exercise. Long: maintain good mental health routine.    Interventions Encouraged to attend Pulmonary Rehabilitation for the exercise    Continue Psychosocial Services  Follow up required by staff          Education: Education Goals: Education classes will be provided on a weekly basis, covering required topics. Participant will state understanding/return demonstration of topics presented.  Learning Barriers/Preferences:   General Pulmonary Education Topics:  Infection  Prevention: - Provides verbal and written material to individual with discussion of infection control including proper hand washing and proper equipment cleaning during exercise session. Flowsheet Row Pulmonary Rehab from 02/11/2024 in [facility] Cardiac and Pulmonary Rehab  Date 02/11/24  Educator [facility]  Instruction Review Code 1- Verbalizes Understanding    Falls Prevention: - Provides verbal and written material to individual with discussion of falls prevention and safety. Flowsheet Row Pulmonary Rehab from 02/11/2024 in [facility] Cardiac and Pulmonary Rehab  Date 02/11/24  Educator [facility]  Instruction Review Code 1- Verbalizes Understanding    Chronic Lung Disease Review: - Group verbal instruction with posters, models, PowerPoint presentations and videos,  to review new updates, new respiratory medications, new advancements in procedures and treatments. Providing information on websites and 800 numbers for continued self-education. Includes information about supplement oxygen , available portable oxygen  systems, continuous and intermittent flow rates, oxygen  safety, concentrators, and Medicare reimbursement for oxygen . Explanation of Pulmonary Drugs, including class, frequency, complications, importance of spacers, rinsing mouth after steroid MDI's, and proper cleaning methods for nebulizers. Review of basic lung anatomy and physiology related to function, structure, and complications of lung disease. Review of risk factors. Discussion about methods for diagnosing sleep apnea and types of masks and machines for OSA. Includes a review of the use of types of environmental controls: home humidity, furnaces, filters, dust mite/pet prevention, HEPA vacuums. Discussion about weather changes, air quality and the benefits of nasal washing. Instruction on Warning signs, infection symptoms, calling MD promptly, preventive modes, and value of vaccinations. Review of effective airway clearance, coughing and/or vibration  techniques. Emphasizing that all should Create an Action Plan. Written material provided at  class time. Flowsheet Row Pulmonary Rehab from 01/29/2023 in [facility] Cardiac and Pulmonary Rehab  Education need identified 11/18/22  Date 01/15/23  Educator [facility]  Instruction Review Code 1- Verbalizes Understanding    AED/CPR: - Group verbal and written instruction with the use of models to demonstrate the basic use of the AED with the basic ABC's of resuscitation.    Tests and Procedures:  - Group verbal and visual presentation and models provide information about basic cardiac anatomy and function. Reviews the testing methods done to diagnose heart disease and the outcomes of the test results. Describes the treatment choices: Medical Management, Angioplasty, or Coronary Bypass Surgery for treating various heart conditions including Myocardial Infarction, Angina, Valve Disease, and Cardiac Arrhythmias.  Written material provided at class time. Flowsheet Row Pulmonary Rehab from 01/29/2023 in [facility] Cardiac and Pulmonary Rehab  Date 12/18/22  Educator SB  Instruction Review Code 1- Verbalizes Understanding    Medication Safety: - Group verbal and visual instruction to review commonly prescribed medications for heart and lung disease. Reviews the medication, class of the drug, and side effects. Includes the steps to properly store meds and maintain the prescription regimen.  Written material given at graduation.   Other: -Provides group and verbal instruction on various topics (see comments) Flowsheet Row Pulmonary Rehab from 01/29/2023 in [facility] Cardiac and Pulmonary Rehab  Date 12/25/22  Educator SB  Instruction Review Code 1- Verbalizes Understanding  [Jeopardy]    Knowledge Questionnaire Score:    Core Components/Risk Factors/Patient Goals at Admission:   Education:Diabetes - Individual verbal and written instruction to review signs/symptoms of diabetes, desired ranges of glucose level fasting,  after meals and with exercise. Acknowledge that pre and post exercise glucose checks will be done for 3 sessions at entry of program.   Know Your Numbers and Heart Failure: - Group verbal and visual instruction to discuss disease risk factors for cardiac and pulmonary disease and treatment options.  Reviews associated critical values for Overweight/Obesity, Hypertension, Cholesterol, and Diabetes.  Discusses basics of heart failure: signs/symptoms and treatments.  Introduces Heart Failure Zone chart for action plan for heart failure. Written material provided at class time.   Core Components/Risk Factors/Patient Goals Review:   Goals and Risk Factor Review     Row Name 04/01/24 1556 05/19/24 1539           Core Components/Risk Factors/Patient Goals Review   Personal Goals Review Improve shortness of breath with ADL's Hypertension      Review Spoke to patient about their shortness of breath and what they can do to improve. Patient has been informed of breathing techniques when starting the program. Patient is informed to tell staff if they have had any med changes and that certain meds they are taking or not taking can be causing shortness of breath. Leightyn states that she takes all her blood pressur medication and follows up with her doctor to manage this. She does report that she takes her BP at  home and it is usually good.      Expected Outcomes Short: Attend [facility] regularly to improve shortness of breath with ADL's. Long: maintain independence with ADL's Short: continue to check BP at home. Long: work with medical team to continue to control blood pressure.         Core Components/Risk Factors/Patient Goals at Discharge (Final Review):   Goals and Risk Factor Review - 05/19/24 1539       Core Components/Risk Factors/Patient Goals Review   Personal Goals Review  Hypertension    Review Nancy [name] states that she takes all her blood pressur medication and follows up with her doctor to manage  this. She does report that she takes her BP at  home and it is usually good.    Expected Outcomes Short: continue to check BP at home. Long: work with medical team to continue to control blood pressure.          ITP Comments:  ITP Comments     Row Name 03/17/24 1301 04/14/24 1051 05/12/24 1057 06/09/24 1006 07/07/24 1027   ITP Comments 30 Day review completed. Medical Director ITP review done, changes made as directed, and signed approval by Medical Director. New to program. 30 Day review completed. Medical Director ITP review done, changes made as directed, and signed approval by Medical Director. 30 Day review completed. Medical Director ITP review done, changes made as directed, and signed approval by Medical Director. 30 Day review completed. Medical Director ITP review done, changes made as directed, and signed approval by Medical Director. 30 Day review completed. Medical Director ITP review done, changes made as directed, and signed approval by Medical Director.    Row Name 08/04/24 1114 09/01/24 1052 09/09/24 1054       ITP Comments 30 Day review completed. Medical Director ITP review done, changes made as directed, and signed approval by Medical Director. Medical hold.  30 Day review completed. Medical Director ITP review done, changes made as directed, and signed approval by Medical Director. Early Discharge due to lack of attendance.        Comments: Early Discharge

## 2024-09-09 NOTE — Progress Notes (Signed)
 Early Discharge Summary  Nancy [name] Apr 10, 1961  [name] is discharging early due to lack of attendance. She completed 20 of 36 sessions.

## 2024-09-23 DIAGNOSIS — N131 Hydronephrosis with ureteral stricture, not elsewhere classified: Secondary | ICD-10-CM | POA: Diagnosis not present

## 2024-09-23 DIAGNOSIS — N135 Crossing vessel and stricture of ureter without hydronephrosis: Secondary | ICD-10-CM | POA: Diagnosis not present

## 2024-09-27 DIAGNOSIS — G4733 Obstructive sleep apnea (adult) (pediatric): Secondary | ICD-10-CM | POA: Diagnosis not present

## 2024-09-27 DIAGNOSIS — J439 Emphysema, unspecified: Secondary | ICD-10-CM | POA: Diagnosis not present

## 2024-09-27 DIAGNOSIS — J449 Chronic obstructive pulmonary disease, unspecified: Secondary | ICD-10-CM | POA: Diagnosis not present

## 2024-09-27 DIAGNOSIS — Z9981 Dependence on supplemental oxygen: Secondary | ICD-10-CM | POA: Diagnosis not present

## 2024-09-27 DIAGNOSIS — R918 Other nonspecific abnormal finding of lung field: Secondary | ICD-10-CM | POA: Diagnosis not present

## 2024-09-27 DIAGNOSIS — R0602 Shortness of breath: Secondary | ICD-10-CM | POA: Diagnosis not present

## 2024-10-04 ENCOUNTER — Telehealth: Payer: Self-pay

## 2024-10-04 NOTE — Telephone Encounter (Signed)
 Copied from CRM [id number]. Topic: Clinical - Request for Lab/Test Order >> Oct 04, 2024  9:23 AM Nancy [name] wrote: Reason for CRM: Pt needs an order for a mammogram at [facility] so she can make an appt.

## 2024-10-04 NOTE — Telephone Encounter (Signed)
 My chart message sent

## 2024-10-09 ENCOUNTER — Other Ambulatory Visit: Payer: Self-pay | Admitting: Dermatology

## 2024-10-09 ENCOUNTER — Encounter: Payer: Self-pay | Admitting: Family Medicine

## 2024-10-11 ENCOUNTER — Telehealth: Payer: Self-pay | Admitting: Family Medicine

## 2024-10-11 NOTE — Telephone Encounter (Signed)
[facility] faxed refill request for the following medications:  valACYclovir (VALTREX) 1000 MG tablet   Please advise. 

## 2024-10-11 NOTE — Telephone Encounter (Signed)
 Converted to rx request

## 2024-10-14 ENCOUNTER — Telehealth: Payer: Self-pay | Admitting: Family Medicine

## 2024-10-14 ENCOUNTER — Telehealth: Payer: Self-pay

## 2024-10-14 DIAGNOSIS — F339 Major depressive disorder, recurrent, unspecified: Secondary | ICD-10-CM

## 2024-10-14 NOTE — Telephone Encounter (Signed)
 [facility] is requesting refills on Duloxetine  HCL 20 mg. #360

## 2024-10-14 NOTE — Telephone Encounter (Signed)
 Previous patient of Kellie's, please review.  LOV- 12/29/2023 with [facility] NOV- None  LRF- 07/13/2024 Outpatient Medication Detail   Disp Refills Start End   DULoxetine  (CYMBALTA ) 20 MG capsule 360 capsule 0 07/13/2024 --   Sig - Route: TAKE 2 CAPSULES BY MOUTH TWICE DAILY - Oral   Sent to pharmacy as: DULoxetine  (CYMBALTA ) 20 MG capsule   Notes to Pharmacy: Needs appt for further refills.   E-Prescribing Status: Receipt confirmed by pharmacy (07/13/2024 10:04 AM EDT)   Renewals  Renewal provider: Gasper Nancyann BRAVO, MD

## 2024-10-14 NOTE — Telephone Encounter (Signed)
 Converted to rx request

## 2024-10-15 ENCOUNTER — Telehealth: Payer: Self-pay | Admitting: Family Medicine

## 2024-10-15 ENCOUNTER — Other Ambulatory Visit: Payer: Self-pay | Admitting: Family Medicine

## 2024-10-15 DIAGNOSIS — Z1231 Encounter for screening mammogram for malignant neoplasm of breast: Secondary | ICD-10-CM

## 2024-10-15 MED ORDER — DULOXETINE HCL 20 MG PO CPEP: 40.0000 mg | ORAL_CAPSULE | Freq: Two times a day (BID) | ORAL | 0 refills | Status: DC

## 2024-10-15 NOTE — Telephone Encounter (Signed)
 Per a call received from an Agent, I contacted Nancy [name] and informed her that she can schedule a Mammogram at [place]. I informed her that she can schedule an OV with one of our providers if she is having complications and we can send a referral for her.

## 2024-10-15 NOTE — Addendum Note (Signed)
 Addended by: GASPER NANCYANN BRAVO on: 10/15/2024 11:57 AM   Modules accepted: Orders

## 2024-10-18 ENCOUNTER — Telehealth: Payer: Self-pay

## 2024-10-18 NOTE — Telephone Encounter (Signed)
 Patient calling requesting a refill of Doxycycline  for Rosacea enough until her appt here 10/27/24

## 2024-10-19 ENCOUNTER — Other Ambulatory Visit: Payer: Self-pay

## 2024-10-19 MED ORDER — DOXYCYCLINE HYCLATE 20 MG PO TABS: ORAL_TABLET | ORAL | 0 refills | Status: AC

## 2024-10-21 ENCOUNTER — Ambulatory Visit: Admitting: Family Medicine

## 2024-10-21 ENCOUNTER — Encounter: Payer: Self-pay | Admitting: Family Medicine

## 2024-10-21 VITALS — BP 112/63 | HR 102 | Temp 97.9°F | Resp 16 | Wt 206.0 lb

## 2024-10-21 DIAGNOSIS — Z1231 Encounter for screening mammogram for malignant neoplasm of breast: Secondary | ICD-10-CM

## 2024-10-21 DIAGNOSIS — Z8619 Personal history of other infectious and parasitic diseases: Secondary | ICD-10-CM | POA: Diagnosis not present

## 2024-10-21 DIAGNOSIS — F418 Other specified anxiety disorders: Secondary | ICD-10-CM | POA: Diagnosis not present

## 2024-10-21 DIAGNOSIS — J441 Chronic obstructive pulmonary disease with (acute) exacerbation: Secondary | ICD-10-CM | POA: Diagnosis not present

## 2024-10-21 MED ORDER — VALACYCLOVIR HCL 1 G PO TABS: 1000.0000 mg | ORAL_TABLET | Freq: Every day | ORAL | 2 refills | Status: AC

## 2024-10-21 MED ORDER — BUSPIRONE HCL 10 MG PO TABS: 10.0000 mg | ORAL_TABLET | Freq: Two times a day (BID) | ORAL | 2 refills | Status: AC

## 2024-10-21 MED ORDER — AMOXICILLIN-POT CLAVULANATE 875-125 MG PO TABS: 1.0000 | ORAL_TABLET | Freq: Two times a day (BID) | ORAL | 0 refills | Status: AC

## 2024-10-21 MED ORDER — PREDNISONE 20 MG PO TABS: 40.0000 mg | ORAL_TABLET | Freq: Every day | ORAL | 0 refills | Status: AC

## 2024-10-21 NOTE — Progress Notes (Signed)
 Established patient visit   Patient: Nancy [name]   DOB: Aug 28, 1961   63 y.o. Female  MRN: [id number] Visit Date: 10/21/2024  Today's healthcare provider: Rockie Agent, MD   Chief Complaint  Patient presents with   Referral    Patient needs a referral to have her mammogram done.   Medication Refill    Needs a refill on Valtrex    URI    Symptoms: since Sunday-cough, sneezing, headache, cough is productive with green-white mucus.  OTC: Mucinex Cold night and day.   Subjective     HPI     Referral    Additional comments: Patient needs a referral to have her mammogram done.        Medication Refill    Additional comments: Needs a refill on Valtrex         URI    Additional comments: Symptoms: since Sunday-cough, sneezing, headache, cough is productive with green-white mucus.  OTC: Mucinex Cold night and day.      Last edited by Rosas, Joseline E, CMA on 10/21/2024  9:23 AM.       Discussed the use of AI scribe software for clinical note transcription with the patient, who gave verbal consent to proceed.  History of Present Illness Nancy [name] is a 63 year old female with COPD who presents with cough and sneezing for four days.  She has been experiencing a productive cough and sneezing for the past four days. She has been using Mucinex cold night and day to manage these symptoms. No sore throat or ear pain. She declines COVID and influenza testing. Her usual shortness of breath due to COPD sometimes takes longer to resolve.  She has a history of COPD and is currently on Trelegy 100/62.5/25 micrograms daily. She reports no previous issues with mucus production until now.  She is also on warfarin and has a history of obstructive sleep apnea, ACE, and aortic aneurysm. She is allergic to codeine .  She experiences cold sores approximately once or twice a year and uses Valtrex  for treatment. She usually takes two tablets at a time, but recently only  had six tablets, which she felt was insufficient to fully manage her symptoms.  She feels 'weepy' daily, especially when discussing important matters, which she attributes to stress from various life events. She is currently taking duloxetine , which she takes as two green capsules, one in the morning and one at night.     Past Medical History:  Diagnosis Date   Aortic dissection (HCC) 2010   COPD (chronic obstructive pulmonary disease) (HCC)    Depression    Hypertension    Hypothyroidism    Pneumonia    PONV (postoperative nausea and vomiting)    Rosacea    Seasonal allergies    Sleep apnea    Does not use CPAP    Medications: Show/hide medication list[1]  Review of Systems      Objective    BP 112/63 (BP Location: Left Arm, Patient Position: Sitting, Cuff Size: Large)   Pulse (!) 102   Temp 97.9 F (36.6 C) (Oral)   Resp 16   Wt 206 lb (93.4 kg)   SpO2 99%   BMI 31.74 kg/m      Physical Exam  Physical Exam VITALS: T- 97.9, SaO2- 99% GENERAL: Tearful, anxious appearing. HEENT: Bilateral tympanic membrane effusions. Nasal congestion. No sinus tenderness. No oropharyngeal erythema. CHEST: No wheezing or crackles.    No results found for any visits  on 10/21/24.  Assessment & Plan     Problem List Items Addressed This Visit     Situational anxiety   Chronic situational anxiety  Experiencing tearfulness and feeling overwhelmed, particularly when discussing important topics. Currently on duloxetine  40 mg twice daily. Symptoms suggestive of anxiety rather than depression. - Prescribed buspirone 10 mg twice daily as needed for anxiety symptoms. - continue cymbalta  40mg  BID  - Advised to start with once daily dosing and increase to twice daily if needed.      Relevant Medications   busPIRone (BUSPAR) 10 MG tablet   Other Visit Diagnoses       COPD with acute exacerbation (HCC)    -  Primary   Relevant Medications   predniSONE  (DELTASONE ) 20 MG tablet    amoxicillin -clavulanate (AUGMENTIN) 875-125 MG tablet     H/O cold sores       Relevant Medications   valACYclovir  (VALTREX ) 1000 MG tablet     Encounter for screening mammogram for malignant neoplasm of breast           Assessment and Plan Assessment & Plan COPD with acute exacerbation Acute exacerbation of COPD with increased sputum production over four days. No wheezing or crackles on lung exam. Oxygen  saturation at 99%. Declines COVID and influenza testing. Suspected COPD exacerbation due to increased sputum production. - Continue Trelegy 100/62.5/25 mcg daily. - Prescribed prednisone  40 mg once daily for 5 days. - Prescribed augmentin 875-125mg  BID for 5 days, unable to prescribe azithromycin  due to current warfarin prescription  - Advised to seek emergency care if experiencing lightheadedness, dizziness, or difficulty breathing.    Herpes labialis Chronic  Recurrent  Recurrent herpes labialis with episodes occurring once or twice a year. Current prescription of Valtrex  is insufficient for treatment duration. - Ensured Valtrex  prescription is adequate for treatment duration, typically 8 tablets.  General Health Maintenance Mammogram ordered for breast cancer screening. - Ordered mammogram for breast cancer screening.     Return in about 4 months (around 02/19/2025) for Chronic F/U.         Rockie Agent, MD  [facility] [phone] (phone) [phone] (fax)  [facility]     [1]  Outpatient Medications Prior to Visit  Medication Sig   acetaminophen  (TYLENOL ) 500 MG tablet Take 500 mg by mouth every 4 (four) hours as needed.   amLODipine  (NORVASC ) 5 MG tablet Take 5 mg by mouth daily.   benzonatate  (TESSALON ) 200 MG capsule Take 1 capsule (200 mg total) by mouth 2 (two) times daily as needed for cough.   diclofenac  Sodium (VOLTAREN ) 1 % GEL Apply 2 g topically 4 (four) times daily.   doxycycline   (PERIOSTAT ) 20 MG tablet Take one tab po BID with food.   DULoxetine  (CYMBALTA ) 20 MG capsule Take 2 capsules (40 mg total) by mouth 2 (two) times daily.   Fluticasone -Umeclidin-Vilant (TRELEGY ELLIPTA ) 100-62.5-25 MCG/ACT AEPB Inhale into the lungs.   furosemide  (LASIX ) 40 MG tablet Take 40 mg by mouth 2 (two) times daily.   furosemide  (LASIX ) 40 MG tablet Take 1 tablet by mouth 2 (two) times daily.   levothyroxine  (SYNTHROID ) 125 MCG tablet Take 1 tablet (125 mcg total) by mouth daily before breakfast.   lidocaine  (LIDODERM ) 5 % Place 1 patch onto the skin every 12 (twelve) hours. Remove & Discard patch within 12 hours or as directed by MD   metoprolol  succinate (TOPROL -XL) 25 MG 24 hr tablet Take 25 mg by mouth daily.  ondansetron  (ZOFRAN  ODT) 4 MG disintegrating tablet Take 1 tablet (4 mg total) by mouth every 8 (eight) hours as needed for nausea or vomiting.   oxyCODONE  (OXY IR/ROXICODONE ) 5 MG immediate release tablet Take 1 tablet (5 mg total) by mouth every 6 (six) hours as needed for moderate pain (pain score 4-6).   polyethylene glycol (MIRALAX  / GLYCOLAX ) 17 g packet Take 17 g by mouth daily as needed.   senna-docusate (SENOKOT-S) 8.6-50 MG tablet Take 1 tablet by mouth 2 (two) times daily as needed for mild constipation.   TRELEGY ELLIPTA  100-62.5-25 MCG/ACT AEPB Inhale into the lungs.   warfarin (COUMADIN ) 5 MG tablet Take 5 mg by mouth every evening. Per cardiologist: patient supposed to take 7.5 mg 9/24 and 9/25 evening for low INR then resume 5 mg nightly  Goal INR 2.5-3.5   [DISCONTINUED] valACYclovir  (VALTREX ) 1000 MG tablet TAKE 1 TABLET BY MOUTH DAILY AS DIRECTED.   albuterol  (VENTOLIN  HFA) 108 (90 Base) MCG/ACT inhaler SMARTSIG:2 Inhalation Via Inhaler Every 6 Hours PRN (Patient not taking: Reported on 10/21/2024)   enoxaparin  (LOVENOX ) 100 MG/ML injection Inject into the skin. (Patient not taking: Reported on 12/24/2023)   enoxaparin  (LOVENOX ) 40 MG/0.4ML injection Inject 90  mg into the skin daily. (Patient not taking: Reported on 12/24/2023)   ipratropium-albuterol  (DUONEB) 0.5-2.5 (3) MG/3ML SOLN Inhale into the lungs. (Patient not taking: Reported on 10/21/2024)   levofloxacin  (LEVAQUIN ) 750 MG tablet Take 1 tablet (750 mg total) by mouth daily.   Na Sulfate-K Sulfate-Mg Sulfate concentrate 17.5-3.13-1.6 GM/177ML SOLN  (Patient not taking: Reported on 12/24/2023)   Facility-Administered Medications Prior to Visit  Medication Dose Route Frequency Provider   ipratropium-albuterol  (DUONEB) 0.5-2.5 (3) MG/3ML nebulizer solution 3 mL  3 mL Nebulization Once [place], Adriana M, PA-C

## 2024-10-21 NOTE — Patient Instructions (Signed)
 To keep you healthy, please keep in mind the following health maintenance items that you are due for:   Health Maintenance Due  Topic Date Due   COVID-19 Vaccine (1) Never done   Pneumococcal Vaccine: 50+ Years (1 of 2 - PCV) Never done   Zoster Vaccines- Shingrix (1 of 2) Never done   DTaP/Tdap/Td (3 - Td or Tdap) 06/12/2022     Best Wishes,   Dr. Lang

## 2024-10-21 NOTE — Assessment & Plan Note (Signed)
 Chronic situational anxiety  Experiencing tearfulness and feeling overwhelmed, particularly when discussing important topics. Currently on duloxetine  40 mg twice daily. Symptoms suggestive of anxiety rather than depression. - Prescribed buspirone 10 mg twice daily as needed for anxiety symptoms. - continue cymbalta  40mg  BID  - Advised to start with once daily dosing and increase to twice daily if needed.

## 2024-10-27 ENCOUNTER — Ambulatory Visit

## 2024-10-27 DIAGNOSIS — L719 Rosacea, unspecified: Secondary | ICD-10-CM

## 2024-10-27 DIAGNOSIS — J439 Emphysema, unspecified: Secondary | ICD-10-CM | POA: Diagnosis not present

## 2024-10-27 MED ORDER — DOXYCYCLINE MONOHYDRATE 100 MG PO TABS: 100.0000 mg | ORAL_TABLET | Freq: Two times a day (BID) | ORAL | 2 refills | Status: DC

## 2024-10-27 MED ORDER — METRONIDAZOLE 0.75 % EX GEL: 1.0000 | Freq: Two times a day (BID) | CUTANEOUS | 0 refills | Status: AC

## 2024-10-27 NOTE — Patient Instructions (Signed)

## 2024-10-27 NOTE — Progress Notes (Signed)
°  °  Subjective   Nancy [name] is a 63 y.o. female who presents for the following: Follow up of rosacea. Patient is established patient   Today patient reports: Needing a refill of Doxycycline  20 mg twice daily for rosacea. She states this works well and her rosacea remains controlled.   Review of Systems:    No other skin or systemic complaints except as noted in HPI or Assessment and Plan.  The following portions of the chart were reviewed this encounter and updated as appropriate: medications, allergies, medical history  Relevant Medical History:  n/a   Objective  (SKPE) Well appearing patient in no apparent distress; mood and affect are within normal limits. Examination was performed of the: Focused Exam of: face   Examination notable for: no rash  Examination limited by: Clothing and Patient deferred removal       Assessment & Plan  (SKAP)   Rosacea Chronic stable condition  - Reviewed etiology and probable recurrent nature of this condition - Discussed potential triggers including caffeine, heat, sun exposure, chocolate, etc. and recommended patient attempt to identify and avoid triggers - Continue doxycycline  20 mg by mouth twice daily  - Start Metrogel  0.75% apply topically twice daily - Sun avoidance, protective clothing sunscreen discussed - Telangiectasias will likely not fade completely, laser removal may be considered as a cosmetic procedure    Was sun protection counseling provided?: Yes   Level of service outlined above   Patient instructions (SKPI)   Procedures, orders, diagnosis for this visit:    There are no diagnoses linked to this encounter.  Return to clinic: Return in about 1 year (around 10/27/2025) for Rosacea.  I, Emerick Ege, CMA am acting as scribe for Lauraine JAYSON Kanaris, MD.   Documentation: I have reviewed the above documentation for accuracy and completeness, and I agree with the above.  Lauraine JAYSON Kanaris, MD

## 2024-11-02 ENCOUNTER — Ambulatory Visit
Admission: RE | Admit: 2024-11-02 | Discharge: 2024-11-02 | Disposition: A | Discharge status: Home / Self Care | Source: Ambulatory Visit | Attending: Family Medicine | Admitting: Family Medicine

## 2024-11-02 DIAGNOSIS — Z1231 Encounter for screening mammogram for malignant neoplasm of breast: Secondary | ICD-10-CM | POA: Insufficient documentation

## 2024-11-08 ENCOUNTER — Other Ambulatory Visit: Payer: Self-pay | Admitting: Family Medicine

## 2024-11-08 DIAGNOSIS — F339 Major depressive disorder, recurrent, unspecified: Secondary | ICD-10-CM

## 2024-11-12 ENCOUNTER — Ambulatory Visit: Payer: Self-pay | Admitting: Family Medicine

## 2025-02-24 ENCOUNTER — Ambulatory Visit: Admitting: Family Medicine

## 2025-02-28 ENCOUNTER — Ambulatory Visit: Admitting: Family Medicine

## 2025-10-27 ENCOUNTER — Ambulatory Visit
